# Patient Record
Sex: Female | Born: 1940 | Race: White | Hispanic: No | Marital: Married | Smoking: Never smoker
Health system: Southern US, Community
[De-identification: ages and names within clinical notes are randomized; demographics above are authoritative.]

## PROBLEM LIST (undated history)

## (undated) DIAGNOSIS — R32 Unspecified urinary incontinence: Secondary | ICD-10-CM

## (undated) DIAGNOSIS — R413 Other amnesia: Secondary | ICD-10-CM

## (undated) DIAGNOSIS — D469 Myelodysplastic syndrome, unspecified: Secondary | ICD-10-CM

## (undated) DIAGNOSIS — D649 Anemia, unspecified: Secondary | ICD-10-CM

## (undated) DIAGNOSIS — R0600 Dyspnea, unspecified: Secondary | ICD-10-CM

## (undated) DIAGNOSIS — B019 Varicella without complication: Secondary | ICD-10-CM

## (undated) DIAGNOSIS — M1990 Unspecified osteoarthritis, unspecified site: Secondary | ICD-10-CM

## (undated) DIAGNOSIS — T7840XA Allergy, unspecified, initial encounter: Secondary | ICD-10-CM

## (undated) DIAGNOSIS — I10 Essential (primary) hypertension: Secondary | ICD-10-CM

## (undated) DIAGNOSIS — M069 Rheumatoid arthritis, unspecified: Secondary | ICD-10-CM

## (undated) DIAGNOSIS — E7800 Pure hypercholesterolemia, unspecified: Secondary | ICD-10-CM

## (undated) DIAGNOSIS — E785 Hyperlipidemia, unspecified: Secondary | ICD-10-CM

## (undated) DIAGNOSIS — R011 Cardiac murmur, unspecified: Secondary | ICD-10-CM

## (undated) DIAGNOSIS — K219 Gastro-esophageal reflux disease without esophagitis: Secondary | ICD-10-CM

## (undated) DIAGNOSIS — K259 Gastric ulcer, unspecified as acute or chronic, without hemorrhage or perforation: Secondary | ICD-10-CM

## (undated) DIAGNOSIS — G7100 Muscular dystrophy, unspecified: Secondary | ICD-10-CM

## (undated) DIAGNOSIS — IMO0002 Reserved for concepts with insufficient information to code with codable children: Secondary | ICD-10-CM

## (undated) HISTORY — DX: Rheumatoid arthritis, unspecified: M06.9

## (undated) HISTORY — DX: Myelodysplastic syndrome, unspecified: D46.9

## (undated) HISTORY — DX: Unspecified osteoarthritis, unspecified site: M19.90

## (undated) HISTORY — PX: CHOLECYSTECTOMY: SHX55

## (undated) HISTORY — PX: APPENDECTOMY: SHX54

## (undated) HISTORY — PX: NM INTRAVENOUS INJECTION (ARMC HX): HXRAD1844

## (undated) HISTORY — DX: Anemia, unspecified: D64.9

## (undated) HISTORY — DX: Varicella without complication: B01.9

## (undated) HISTORY — DX: Hyperlipidemia, unspecified: E78.5

## (undated) HISTORY — DX: Gastric ulcer, unspecified as acute or chronic, without hemorrhage or perforation: K25.9

## (undated) HISTORY — DX: Allergy, unspecified, initial encounter: T78.40XA

## (undated) HISTORY — PX: JOINT REPLACEMENT: SHX530

## (undated) HISTORY — PX: TONSILLECTOMY: SUR1361

## (undated) HISTORY — DX: Essential (primary) hypertension: I10

## (undated) HISTORY — PX: COLONOSCOPY: SHX174

## (undated) HISTORY — DX: Gastro-esophageal reflux disease without esophagitis: K21.9

## (undated) HISTORY — DX: Muscular dystrophy, unspecified: G71.00

## (undated) HISTORY — PX: WISDOM TOOTH EXTRACTION: SHX21

## (undated) HISTORY — PX: UPPER GASTROINTESTINAL ENDOSCOPY: SHX188

## (undated) HISTORY — DX: Reserved for concepts with insufficient information to code with codable children: IMO0002

## (undated) HISTORY — PX: ABDOMINAL HYSTERECTOMY: SHX81

---

## 1998-03-06 DIAGNOSIS — D469 Myelodysplastic syndrome, unspecified: Secondary | ICD-10-CM

## 1998-03-06 HISTORY — DX: Myelodysplastic syndrome, unspecified: D46.9

## 2016-05-04 DIAGNOSIS — I10 Essential (primary) hypertension: Secondary | ICD-10-CM | POA: Insufficient documentation

## 2016-05-04 DIAGNOSIS — N3946 Mixed incontinence: Secondary | ICD-10-CM | POA: Insufficient documentation

## 2016-05-04 DIAGNOSIS — M0579 Rheumatoid arthritis with rheumatoid factor of multiple sites without organ or systems involvement: Secondary | ICD-10-CM | POA: Insufficient documentation

## 2016-05-04 DIAGNOSIS — E538 Deficiency of other specified B group vitamins: Secondary | ICD-10-CM | POA: Insufficient documentation

## 2016-05-12 DIAGNOSIS — D469 Myelodysplastic syndrome, unspecified: Secondary | ICD-10-CM | POA: Insufficient documentation

## 2016-05-18 ENCOUNTER — Encounter: Payer: Self-pay | Admitting: Oncology

## 2016-05-18 ENCOUNTER — Inpatient Hospital Stay: Payer: Medicare Other | Attending: Oncology | Admitting: Oncology

## 2016-05-18 VITALS — BP 171/81 | HR 83 | Temp 97.5°F | Resp 18 | Ht <= 58 in | Wt 134.9 lb

## 2016-05-18 DIAGNOSIS — Z90710 Acquired absence of both cervix and uterus: Secondary | ICD-10-CM

## 2016-05-18 DIAGNOSIS — Z801 Family history of malignant neoplasm of trachea, bronchus and lung: Secondary | ICD-10-CM | POA: Diagnosis not present

## 2016-05-18 DIAGNOSIS — D72819 Decreased white blood cell count, unspecified: Secondary | ICD-10-CM | POA: Insufficient documentation

## 2016-05-18 DIAGNOSIS — Z7982 Long term (current) use of aspirin: Secondary | ICD-10-CM | POA: Diagnosis not present

## 2016-05-18 DIAGNOSIS — I10 Essential (primary) hypertension: Secondary | ICD-10-CM | POA: Diagnosis not present

## 2016-05-18 DIAGNOSIS — D469 Myelodysplastic syndrome, unspecified: Secondary | ICD-10-CM | POA: Diagnosis present

## 2016-05-18 DIAGNOSIS — K219 Gastro-esophageal reflux disease without esophagitis: Secondary | ICD-10-CM | POA: Insufficient documentation

## 2016-05-18 DIAGNOSIS — Z79899 Other long term (current) drug therapy: Secondary | ICD-10-CM | POA: Diagnosis not present

## 2016-05-18 DIAGNOSIS — E538 Deficiency of other specified B group vitamins: Secondary | ICD-10-CM | POA: Diagnosis not present

## 2016-05-18 DIAGNOSIS — Z9049 Acquired absence of other specified parts of digestive tract: Secondary | ICD-10-CM | POA: Insufficient documentation

## 2016-05-18 DIAGNOSIS — N3946 Mixed incontinence: Secondary | ICD-10-CM | POA: Diagnosis not present

## 2016-05-18 DIAGNOSIS — E785 Hyperlipidemia, unspecified: Secondary | ICD-10-CM

## 2016-05-18 DIAGNOSIS — M0579 Rheumatoid arthritis with rheumatoid factor of multiple sites without organ or systems involvement: Secondary | ICD-10-CM | POA: Diagnosis not present

## 2016-05-18 NOTE — Progress Notes (Signed)
Here for initial evaluation  

## 2016-05-18 NOTE — Progress Notes (Signed)
Hematology/Oncology Consult note [facility] [phone] [phone]  No care team member to display   Name of the patient: Sylvia Miller  [id number]  December 13, 1940    Reason for referral- h/o MDS   Referring physician- Dr. Netty Starring  Date of visit: 05/18/16   History of presenting illness- patient is a 76 year old female who initially had all her care at [place]. Past medical history is significant for B12 and deficiency for which she receives B12 injections monthly. She also has hypertension hyperlipidemia and rheumatoid arthritis along with other medical problems. She has a history of MDS and had a bone marrow biopsy in 2000 and she was being treated with Aranesp in the past with good response. Looks like her last dose of Aranesp was sometime in 2013 and since then her hemoglobin has remained stable around 12. In October 2016 CBC showed white count of 5.6, H&H of 11.8/36 and a platelet count of 202. MCV was 101. Her labs on 05/05/2016 were as follows: CBC showed white count of 3.4, H&H of 12.5/37 with an MCV of 102.5 and a platelet count of 172. B12 was normal at 642. CMP was within normal limits..  ECOG PS- 2  Pain scale- 9- joint pain    Review of systems- Review of Systems  Constitutional: Negative for chills, fever, malaise/fatigue and weight loss.  HENT: Negative for congestion, ear discharge and nosebleeds.   Eyes: Negative for blurred vision.  Respiratory: Negative for cough, hemoptysis, sputum production, shortness of breath and wheezing.   Cardiovascular: Negative for chest pain, palpitations, orthopnea and claudication.  Gastrointestinal: Negative for abdominal pain, blood in stool, constipation, diarrhea, heartburn, melena, nausea and vomiting.  Genitourinary: Negative for dysuria, flank pain, frequency, hematuria and urgency.  Musculoskeletal: Positive for joint pain (pain in b.l kness and hands). Negative for back pain and  myalgias.  Skin: Negative for rash.  Neurological: Negative for dizziness, tingling, focal weakness, seizures, weakness and headaches.  Endo/Heme/Allergies: Does not bruise/bleed easily.  Psychiatric/Behavioral: Negative for depression and suicidal ideas. The patient does not have insomnia.     No Known Allergies  Patient Active Problem List   Diagnosis Date Noted  . MDS (myelodysplastic syndrome) ([place]) 05/12/2016  . B12 deficiency 05/04/2016  . Essential hypertension 05/04/2016  . Mixed stress and urge urinary incontinence 05/04/2016  . Rheumatoid arthritis involving multiple sites with positive rheumatoid factor ([place]) 05/04/2016     Past Medical History:  Diagnosis Date  . Allergy   . Arthritis    rhematoid /knees/hands/shoulders  . GERD (gastroesophageal reflux disease)   . Hypertension   . MDS (myelodysplastic syndrome) ([place]) 2000  . Ulcer [facility])      Past Surgical History:  Procedure Laterality Date  . ABDOMINAL HYSTERECTOMY    . CHOLECYSTECTOMY      Social History   Social History  . Marital status: Married    Spouse name: N/A  . Number of children: N/A  . Years of education: N/A   Occupational History  . Not on file.   Social History Main Topics  . Smoking status: Never Smoker  . Smokeless tobacco: Never Used  . Alcohol use No  . Drug use: No  . Sexual activity: Yes   Other Topics Concern  . Not on file   Social History Narrative  . No narrative on file     Family History  Problem Relation Age of Onset  . Cancer Mother   . Lung cancer Mother   . Lung  cancer Father      Current Outpatient Prescriptions:  .  acetaminophen (TYLENOL) 500 MG tablet, Take by mouth., Disp: , Rfl:  .  aspirin EC 81 MG tablet, Take by mouth., Disp: , Rfl:  .  B Complex Vitamins (VITAMIN-B COMPLEX) TABS, Take by mouth., Disp: , Rfl:  .  cyanocobalamin (,VITAMIN B-12,) 1000 MCG/ML injection, Inject into the muscle., Disp: , Rfl:  .  GLUCOSAMINE HCL PO, Take by  mouth., Disp: , Rfl:  .  lisinopril (PRINIVIL,ZESTRIL) 5 MG tablet, Take by mouth., Disp: , Rfl:  .  oxybutynin (DITROPAN-XL) 10 MG 24 hr tablet, Take by mouth., Disp: , Rfl:  .  potassium chloride SA (K-DUR,KLOR-CON) 20 MEQ tablet, Take by mouth., Disp: , Rfl:  .  ranitidine (ZANTAC) 150 MG tablet, Take by mouth., Disp: , Rfl:  .  sulfaSALAzine (AZULFIDINE) 500 MG tablet, Take by mouth., Disp: , Rfl:  .  vitamin B-12 (CYANOCOBALAMIN) 1000 MCG tablet, Take by mouth., Disp: , Rfl:  .  guaiFENesin (MUCINEX) 600 MG 12 hr tablet, Take by mouth., Disp: , Rfl:    Physical exam:  Vitals:   05/18/16 0939  BP: (!) 171/81  Pulse: 83  Resp: 18  Temp: 97.5 F (36.4 C)  TempSrc: Tympanic  Weight: 134 lb 14.7 oz (61.2 kg)  Height: 4' 9.48" (1.46 m)   Physical Exam  Constitutional: She is oriented to person, place, and time.  Elderly, walks slowly with a walker  HENT:  Head: Normocephalic and atraumatic.  Eyes: EOM are normal. Pupils are equal, round, and reactive to light.  Neck: Normal range of motion.  Cardiovascular: Normal rate, regular rhythm and normal heart sounds.   Pulmonary/Chest: Effort normal and breath sounds normal.  Abdominal: Soft. Bowel sounds are normal.  Musculoskeletal:  Changes of rheumatoid arthropathy seen in joints of b/l hands  Neurological: She is alert and oriented to person, place, and time.  Skin: Skin is warm and dry.      Assessment and plan- Patient is a 76 y.o. female with a history of MDS in the past who has been referred to as a transfer of care from [place]  We will get in touch with her primary hematologist to see if we can obtain the results of prior bone marrow biopsy. Based on her most recent labs patient was found to have a mild leukopenia but otherwise no evidence of anemia or thrombocytopenia. Patient has not required Aranesp shots in a while.  Given that her counts have been overall stable I will see her back in 3 months time with a repeat CBC  and manual diff. MDS as such is not a curable condition and treatment is considered only when there is significant pancytopenia. At such time we may have to consider repeat bone marrow biopsy.   Thank you for this kind referral and the opportunity to participate in the care of this patient   Visit Diagnosis 1. MDS (myelodysplastic syndrome) ([place])     Dr. Randa Evens, MD, MPH [facility] at [facility] Pager- [phone] 05/18/2016 11:20 AM

## 2016-06-07 ENCOUNTER — Ambulatory Visit: Payer: Medicare Other | Attending: Family Medicine | Admitting: Physical Therapy

## 2016-06-07 ENCOUNTER — Encounter: Payer: Self-pay | Admitting: Physical Therapy

## 2016-06-07 DIAGNOSIS — R2681 Unsteadiness on feet: Secondary | ICD-10-CM | POA: Insufficient documentation

## 2016-06-07 DIAGNOSIS — R262 Difficulty in walking, not elsewhere classified: Secondary | ICD-10-CM | POA: Diagnosis present

## 2016-06-07 DIAGNOSIS — M6281 Muscle weakness (generalized): Secondary | ICD-10-CM | POA: Diagnosis present

## 2016-06-07 NOTE — Therapy (Signed)
[place] MAIN [facility] SERVICES [address] [place], [place], [address] Phone: [phone]   Fax:  [phone]  Physical Therapy Evaluation  Patient Details  Name: Sylvia Miller MRN: [id number] Date of Birth: September 09, 1940 Referring Provider: Dr. Netty Starring  Encounter Date: 06/07/2016      PT End of Session - 06/07/16 0953    Visit Number 1   Number of Visits 17   Date for PT Re-Evaluation 08/02/16   Authorization Type gcodes 1/10   PT Start Time 0902   PT Stop Time 0955   PT Time Calculation (min) 53 min   Activity Tolerance Patient limited by fatigue   Behavior During Therapy [facility] for tasks assessed/performed      Past Medical History:  Diagnosis Date  . Allergy   . Arthritis    rhematoid /knees/hands/shoulders (Right shoulder)   . GERD (gastroesophageal reflux disease)   . Hypertension    controlled with meds;   Marland Kitchen MDS (myelodysplastic syndrome) ([place]) 2000  . Ulcer [facility])     Past Surgical History:  Procedure Laterality Date  . ABDOMINAL HYSTERECTOMY    . CHOLECYSTECTOMY      There were no vitals filed for this visit.       Subjective Assessment - 06/07/16 0906    Subjective 76 yo Female moved to [place] from [place] to be closer to family; She reports having trouble with her walking off/on for years. She did receive outpatient PT for balance and strengthening last year with good results, but has noticed that recently she has had a harder time walking and feeling unsteady; she is currently walking with a rollator off/on for about 4-5 years; She will walk short distances without the walker; she will use a cane sometimes; She denies any falls in last few years but does try to be careful to avoid falls; She does report tingling in the knees but denies any numbness; She recently was referred to Rheumatologist for hip/knee pain and shoulder pain; They are waiting for a plan for pain management; She had started rumacaid  injection but has not had it recently;    Pertinent History personal factors affecting rehab: RA, MDS, high fall risk, 2 story home but able to live on main floor, has supportive caregiver;    Limitations Standing;Walking   How long can you stand comfortably? <5 min;    How long can you walk comfortably? with walker can walk >500 feet, slow    Diagnostic tests Had X-rays hip/knee for rheumatologists, unsure of results;    Patient Stated Goals "feel confident walking without falling" Strike to walk without walker if possible;    Currently in Pain? Yes   Pain Score 6    Pain Location Knee   Pain Orientation Right;Left   Pain Descriptors / Indicators Aching;Throbbing   Pain Type Chronic pain   Pain Radiating Towards lower legs;    Pain Onset More than a month ago   Pain Frequency Intermittent   Aggravating Factors  walking long distances, standing, transfers,    Pain Relieving Factors lying down, rest, pain relieving cream;    Effect of Pain on Daily Activities decreased            [facility] PT Assessment - 06/07/16 0001      Assessment   Medical Diagnosis Risk for falls   Referring Provider Dr. Netty Starring   Onset Date/Surgical Date --  within last year   Hand Dominance Right   Next MD Visit next  week for blood work;    Prior Therapy had PT about a year ago with good results; however reports that she did not continue with HEP and has gotten weaker;      Precautions   Precautions Fall     Restrictions   Weight Bearing Restrictions No     Balance Screen   Has the patient fallen in the past 6 months No   Has the patient had a decrease in activity level because of a fear of falling?  Yes   Is the patient reluctant to leave their home because of a fear of falling?  No     Home Environment   Additional Comments lives in 2 story home, 17 steps to 2nd floor but patient does not go up stairs; bed/bath on main floor; lives with husband, mod I for self care ADLs;   does have  supervision for showers;      Prior Function   Level of Independence Independent with household mobility with device   Vocation Retired   Leisure spend time with grandkids; reads,      Cognition   Overall Cognitive Status Within Functional Limits for tasks assessed     Observation/Other Assessments   Activities of Balance Confidence Scale (ABC Scale)  43.75 % (low level physical functioning, high fall risk)     Sensation   Light Touch Appears Intact   Stereognosis Appears Intact   Proprioception Appears Intact     Coordination   Gross Motor Movements are Fluid and Coordinated Yes   Fine Motor Movements are Fluid and Coordinated Yes     AROM   Overall AROM Comments BUE and BLE are WFL, decreased shoulder, able to reach behind head/ back;     Strength   Overall Strength Comments BUE grossly 3/5, decreased grip strength but equal bilaterally; BLE: hip grossly 3/5, knee 3+/5 ankle 3+/5     Palpation   Palpation comment reports mild-moderate tenderness to joint palpation;      Transfers   Comments requires HHA pushing from chair to get up, very slow;      Ambulation/Gait   Gait Comments ambulates with rollator, wide base of support, with severe knee valgus, ankle inversion; slow gait speed, requires UE pushing on rollator for safety;      6 minute walk test results    Endurance additional comments fatigues very quickly;      Standardized Balance Assessment   10 Meter Walk 0.32 m/s with rollator (high fall risk. limited home ambulator)     High Level Balance   High Level Balance Comments able to stand, feet apart, eyes open/closed unsupported with good static balance; dynamic balance is poor;          TREATMENT: Initiated HEP: Seated with red tband around both legs: Hip flexion march x10 bilaterally; Hip abduction x10 LAQ x10 bilaterally; Ankle DF x10 bilaterally; Patient required min-moderate verbal/tactile cues for correct exercise technique including cues to  increase ROM for better strengthening;  Provided written handout for better strengthening;                    PT Education - 06/07/16 0951    Education provided Yes   Education Details HEP initiated, recommendations;    Person(s) Educated Patient   Methods Explanation;Verbal cues;Handout;Demonstration   Comprehension Verbalized understanding;Returned demonstration;Verbal cues required             PT Long Term Goals - 06/07/16 3559  PT LONG TERM GOAL #1   Title Patient will be independent in home exercise program to improve strength/mobility for better functional independence with ADLs.   Time 8   Period Weeks   Status New     PT LONG TERM GOAL #2   Title Patient will increase 10 meter walk test to > 0.8 m/s as to improve gait speed for better community ambulation and to reduce fall risk.   Time 8   Period Weeks   Status New     PT LONG TERM GOAL #3   Title Patient will increase ABC scale score >70% to demonstrate better functional mobility and better confidence with ADLs.    Time 8   Period Weeks   Status New     PT LONG TERM GOAL #4   Title Patient will increase BLE gross strength to 4+/5 as to improve functional strength for independent gait, increased standing tolerance and increased ADL ability.   Time 8   Period Weeks   Status New     PT LONG TERM GOAL #5   Title Patient will be modified independent in walking on even/uneven surface with least restrictive assistive device, for 20+ minutes without rest break, reporting 5/10 pain in LE's or less to improve walking tolerance with community ambulation including grocery shopping, going to church,etc.    Time 8   Period Weeks   Status New               Plan - 06/24/16 0962    Clinical Impression Statement 76 yo Female has a medical history of severe Rheumatoid Arthritis with pain in shoulders, hips, knees and hands. She reports increased difficulty walking and decreased balance over last  year. She recently moved to [facility] from [place] and is trying to get established with new physicians and healthcare. She tested as a high fall risk, but denies any recent falls. She demonstrates significant weakness in BUE and BLE. Patient reports decreased activity level sitting and sleeping most of the day. She currently ambulates with a rollator at all times.  She would benefit from additional skilled PT intervention to improve strength, balance and gait safety;    Rehab Potential Fair   Clinical Impairments Affecting Rehab Potential positive: good caregiver support, no recent history of falls; negative: co-morbidities; Patient's clinical presentation is evolving as she has severe pain that fluctuates in multiple joints, high fall risk;    PT Frequency 2x / week   PT Duration 8 weeks   PT Treatment/Interventions Aquatic Therapy;Cryotherapy;Moist Heat;Gait training;Stair training;Functional mobility training;Therapeutic activities;Therapeutic exercise;Patient/family education;Orthotic Fit/Training;Neuromuscular re-education;Balance training;Manual techniques;Taping;Energy conservation;Dry needling;Passive range of motion   PT Next Visit Plan advance HEP   PT Home Exercise Plan initiated- see patient instructions;    Consulted and Agree with Plan of Care Patient      Patient will benefit from skilled therapeutic intervention in order to improve the following deficits and impairments:  Abnormal gait, Pain, Postural dysfunction, Decreased mobility, Decreased activity tolerance, Decreased endurance, Decreased range of motion, Decreased strength, Impaired UE functional use, Impaired flexibility, Difficulty walking, Decreased safety awareness, Decreased balance  Visit Diagnosis: Muscle weakness (generalized) - Plan: PT plan of care cert/re-cert  Difficulty in walking, not elsewhere classified - Plan: PT plan of care cert/re-cert  Unsteadiness on feet - Plan: PT plan of care cert/re-cert       G-Codes - 06/24/2016 1000    Functional Assessment Tool Used (Outpatient Only) 10 meter walk, clinical judgement, strength;    Functional Limitation  Mobility: Walking and moving around   Mobility: Walking and Moving Around Current Status [phone]) At least 40 percent but less than 60 percent impaired, limited or restricted   Mobility: Walking and Moving Around Goal Status [phone]) At least 20 percent but less than 40 percent impaired, limited or restricted       Problem List Patient Active Problem List   Diagnosis Date Noted  . MDS (myelodysplastic syndrome) ([place]) 05/12/2016  . B12 deficiency 05/04/2016  . Essential hypertension 05/04/2016  . Mixed stress and urge urinary incontinence 05/04/2016  . Rheumatoid arthritis involving multiple sites with positive rheumatoid factor ([place]) 05/04/2016    Lounette Sloan PT, DPT 06/07/2016, 10:01 AM  [place] MAIN [facility] SERVICES [address] [place], [place], [address] Phone: [phone]   Fax:  [phone]  Name: Sylvia Miller MRN: [id number] Date of Birth: 17-Jul-1940

## 2016-06-07 NOTE — Patient Instructions (Signed)
  ABDUCTION: Sitting - Exercise Ball: Resistance Band (Active)   Sit with feet flat. With band tied around both legs, Lift right leg slightly and, against resistance band, draw it out to side. Complete __2_ sets of __10_ repetitions. Perform _2__ sessions per day.  Copyright  VHI. All rights reserved.  FLEXION: Sitting - Resistance Band (Active)   Sit, both feet flat. Have band tied around both legs above knees, lift right knee toward ceiling.Repeat with other knee Complete _2__ sets of _10__ repetitions. Perform _2__ sessions per day.  [email or web address].exer.[email or web address]  Knee Extension: Resisted (Sitting)   With band looped around right ankle and under other foot, straighten leg with ankle loop. Keep other leg bent to increase resistance. Repeat _10___ times per set. Do __2__ sets per session. Do _2___ sessions per day.  http://orth.exer.us/691   Copyright  VHI. All rights reserved.     Sit with right foot flat. Have band tied around both feet, bend ankle, bringing toes toward head. Complete __2_ sets of __10_ repetitions. Perform _2__ sessions per day.  Copyright  VHI. All rights reserved.  Toe / Heel Raise (Sitting)   Sitting, raise heels, then rock back on heels and raise toes. Repeat _10___ times.  Copyright  VHI. All rights reserved.

## 2016-06-13 ENCOUNTER — Encounter: Payer: Self-pay | Admitting: Physical Therapy

## 2016-06-13 ENCOUNTER — Ambulatory Visit: Payer: Medicare Other | Admitting: Physical Therapy

## 2016-06-13 DIAGNOSIS — M6281 Muscle weakness (generalized): Secondary | ICD-10-CM

## 2016-06-13 DIAGNOSIS — R262 Difficulty in walking, not elsewhere classified: Secondary | ICD-10-CM

## 2016-06-13 DIAGNOSIS — R2681 Unsteadiness on feet: Secondary | ICD-10-CM

## 2016-06-13 NOTE — Therapy (Signed)
[place] MAIN [facility] SERVICES [address] [place], [place], [address] Phone: [phone]   Fax:  [phone]  Physical Therapy Treatment  Patient Details  Name: Sylvia Miller MRN: [id number] Date of Birth: 1940/06/08 Referring Provider: Dr. Netty Starring  Encounter Date: 06/13/2016      PT End of Session - 06/13/16 1118    Visit Number 2   Number of Visits 17   Date for PT Re-Evaluation 08/02/16   Authorization Type gcodes 2/10   PT Start Time 1118   PT Stop Time 1159   PT Time Calculation (min) 41 min   Activity Tolerance Patient limited by fatigue   Behavior During Therapy [facility] for tasks assessed/performed      Past Medical History:  Diagnosis Date  . Allergy   . Arthritis    rhematoid /knees/hands/shoulders (Right shoulder)   . GERD (gastroesophageal reflux disease)   . Hypertension    controlled with meds;   Marland Kitchen MDS (myelodysplastic syndrome) ([place]) 2000  . Ulcer [facility])     Past Surgical History:  Procedure Laterality Date  . ABDOMINAL HYSTERECTOMY    . CHOLECYSTECTOMY      There were no vitals filed for this visit.      Subjective Assessment - 06/13/16 1127    Subjective Pt reports no new changes or concerns.  She has not had a chance to complete her HEP as she was busy traveling to visit her daughter in [place].     Pertinent History personal factors affecting rehab: RA, MDS, high fall risk, 2 story home but able to live on main floor, has supportive caregiver;    Limitations Standing;Walking   How long can you stand comfortably? <5 min;    How long can you walk comfortably? with walker can walk >500 feet, slow    Diagnostic tests Had X-rays hip/knee for rheumatologists, unsure of results;    Patient Stated Goals "feel confident walking without falling" Strike to walk without walker if possible;    Currently in Pain? No/denies       TREATMENT   Therapeutic Exercise:  Sit<>stand from chair with use of BUEs for  support to stand and to reach back to sit. Cues for eccentric control. 2x5    Seated Bil hip ER/Abd with GTB around knees 2x15   Alternating toe taps up to 6" step x2 minutes with intermittent min assist to steady   Side stepping over balance stones x8 lengths in // bars    Neuromuscular Re-ed:  Rhomberg stance on airex 2x30 seconds with intermittent min assist to steady   Tandem stance 2x30 seconds with L foot forward and 2x30 seconds with R foot forward with intermittent min assist to steady   Obstacle course stepping over hurtle, walking over airex pad, sit<>stand, stepping up and over 8" step and weaving around cones. x6 minutes                PT Education - 06/13/16 1118    Education provided Yes   Education Details Exercise technique   Person(s) Educated Patient   Methods Explanation;Demonstration   Comprehension Verbalized understanding;Returned demonstration;Need further instruction             PT Long Term Goals - 06/07/16 0957      PT LONG TERM GOAL #1   Title Patient will be independent in home exercise program to improve strength/mobility for better functional independence with ADLs.   Time 8   Period Weeks   Status New  PT LONG TERM GOAL #2   Title Patient will increase 10 meter walk test to > 0.8 m/s as to improve gait speed for better community ambulation and to reduce fall risk.   Time 8   Period Weeks   Status New     PT LONG TERM GOAL #3   Title Patient will increase ABC scale score >70% to demonstrate better functional mobility and better confidence with ADLs.    Time 8   Period Weeks   Status New     PT LONG TERM GOAL #4   Title Patient will increase BLE gross strength to 4+/5 as to improve functional strength for independent gait, increased standing tolerance and increased ADL ability.   Time 8   Period Weeks   Status New     PT LONG TERM GOAL #5   Title Patient will be modified independent in walking on even/uneven surface  with least restrictive assistive device, for 20+ minutes without rest break, reporting 5/10 pain in LE's or less to improve walking tolerance with community ambulation including grocery shopping, going to church,etc.    Time 8   Period Weeks   Status New               Plan - 06/13/16 1135    Clinical Impression Statement Pt presents with BLE weakness and impaired balance so focus of today's session was on strengthening and neuromuscular re-ed.  Pt demonstrated fatigue with BLE strengthening exercises but tolerated all wel.  She demonstrates difficulty with all balance activities on uneven surfaces.  Pt will benefit from continued skilled PT interventions for improved strength and balance.   Rehab Potential Fair   Clinical Impairments Affecting Rehab Potential positive: good caregiver support, no recent history of falls; negative: co-morbidities; Patient's clinical presentation is evolving as she has severe pain that fluctuates in multiple joints, high fall risk;    PT Frequency 2x / week   PT Duration 8 weeks   PT Treatment/Interventions Aquatic Therapy;Cryotherapy;Moist Heat;Gait training;Stair training;Functional mobility training;Therapeutic activities;Therapeutic exercise;Patient/family education;Orthotic Fit/Training;Neuromuscular re-education;Balance training;Manual techniques;Taping;Energy conservation;Dry needling;Passive range of motion   PT Next Visit Plan advance HEP   PT Home Exercise Plan continue HEP as prescribed.   Consulted and Agree with Plan of Care Patient      Patient will benefit from skilled therapeutic intervention in order to improve the following deficits and impairments:  Abnormal gait, Pain, Postural dysfunction, Decreased mobility, Decreased activity tolerance, Decreased endurance, Decreased range of motion, Decreased strength, Impaired UE functional use, Impaired flexibility, Difficulty walking, Decreased safety awareness, Decreased balance  Visit  Diagnosis: Muscle weakness (generalized)  Difficulty in walking, not elsewhere classified  Unsteadiness on feet     Problem List Patient Active Problem List   Diagnosis Date Noted  . MDS (myelodysplastic syndrome) ([place]) 05/12/2016  . B12 deficiency 05/04/2016  . Essential hypertension 05/04/2016  . Mixed stress and urge urinary incontinence 05/04/2016  . Rheumatoid arthritis involving multiple sites with positive rheumatoid factor ([place]) 05/04/2016    Collie Siad PT, DPT 06/13/2016, 12:01 PM  [place] MAIN [facility] SERVICES [address] [place], [place], [address] Phone: [phone]   Fax:  [phone]  Name: Kemyah Buser MRN: [id number] Date of Birth: 08-21-1940

## 2016-06-15 ENCOUNTER — Ambulatory Visit: Payer: Medicare Other | Admitting: Physical Therapy

## 2016-06-15 ENCOUNTER — Encounter: Payer: Self-pay | Admitting: Physical Therapy

## 2016-06-15 ENCOUNTER — Other Ambulatory Visit: Payer: Self-pay | Admitting: Gastroenterology

## 2016-06-15 DIAGNOSIS — R2681 Unsteadiness on feet: Secondary | ICD-10-CM

## 2016-06-15 DIAGNOSIS — R262 Difficulty in walking, not elsewhere classified: Secondary | ICD-10-CM

## 2016-06-15 DIAGNOSIS — M6281 Muscle weakness (generalized): Secondary | ICD-10-CM

## 2016-06-15 DIAGNOSIS — R1032 Left lower quadrant pain: Secondary | ICD-10-CM

## 2016-06-15 DIAGNOSIS — R1031 Right lower quadrant pain: Secondary | ICD-10-CM

## 2016-06-15 NOTE — Therapy (Signed)
[name] MAIN [facility] SERVICES [address] [place], [place], [address] Phone: [phone]   Fax:  [phone]  Physical Therapy Treatment  Patient Details  Name: Sylvia Miller MRN: [id number] Date of Birth: 1941/02/25 Referring Provider: Dr. Netty Starring  Encounter Date: 06/15/2016      PT End of Session - 06/15/16 1129    Visit Number 3   Number of Visits 17   Date for PT Re-Evaluation 08/02/16   Authorization Type gcodes 3/10   PT Start Time 1118   PT Stop Time 1200   PT Time Calculation (min) 42 min   Equipment Utilized During Treatment Gait belt   Activity Tolerance Patient limited by fatigue   Behavior During Therapy [facility] for tasks assessed/performed      Past Medical History:  Diagnosis Date  . Allergy   . Arthritis    rhematoid /knees/hands/shoulders (Right shoulder)   . GERD (gastroesophageal reflux disease)   . Hypertension    controlled with meds;   Marland Kitchen MDS (myelodysplastic syndrome) ([place]) 2000  . Ulcer [facility])     Past Surgical History:  Procedure Laterality Date  . ABDOMINAL HYSTERECTOMY    . CHOLECYSTECTOMY      There were no vitals filed for this visit.      Subjective Assessment - 06/15/16 1128    Subjective Patient reports doing well; She reports increased LE pain with advanced exercise.    Pertinent History personal factors affecting rehab: RA, MDS, high fall risk, 2 story home but able to live on main floor, has supportive caregiver;    Limitations Standing;Walking   How long can you stand comfortably? <5 min;    How long can you walk comfortably? with walker can walk >500 feet, slow    Diagnostic tests Had X-rays hip/knee for rheumatologists, unsure of results;    Patient Stated Goals "feel confident walking without falling" Strike to walk without walker if possible;    Currently in Pain? Yes   Pain Score 7    Pain Location Knee   Pain Orientation Left   Pain Descriptors / Indicators Aching;Sore   Pain  Type Chronic pain   Pain Radiating Towards lower leg;    Pain Onset More than a month ago   Pain Frequency Intermittent   Aggravating Factors  standing, walking   Pain Relieving Factors rest   Effect of Pain on Daily Activities decreased        TREATMENT  Warm up on Nustep BUE/BLE level 2 x4 min (unbilled);  Seated with red tband around both legs: Hip flexion march 2 x12 with tactile cues to increase ROM for better strengthening; Hip abduction/ER x15 with cues to increase ROM for better strengthening; Ankle DF x15 with min VCs for positioning to improve strengthening;   Forward/backward step over 1/2 bolster x5 with rail assist Side step over 1/2 bolster x5 each direction Patient requires rail assist for safety and min Vcs to increase step length for better foot clearance;   Standing: Hip abduction x10 red tband LLE, patient reports increased pain with RLE, discontinued;  Step ups on 4 inch step with B rail assist x5 with cues for forward lean and to improve weight shift for better foot clearance;    Neuromuscular Re-ed: Standing on 1/2 bolster (flat side up) unsupported x15 sec hold x2 sets with cues to work on ankle strategies for better stance control;   Alternate toe taps on 4 inch step with 1 rail assist x10 bilaterally with min  A for balance and cues for weight shift to improve stance control;    Standing one foot on floor, one foot on 4 inch step, BUE ball pass side/side x5 each foot on step, min A for stance control and cues to slow down UE movement for better balance;                       PT Education - 06/15/16 1129    Education provided Yes   Education Details HEP reinforced, strengthening, balance   Person(s) Educated Patient   Methods Explanation;Demonstration;Verbal cues   Comprehension Verbalized understanding;Returned demonstration;Verbal cues required             PT Long Term Goals - 06/07/16 0957      PT LONG TERM GOAL #1    Title Patient will be independent in home exercise program to improve strength/mobility for better functional independence with ADLs.   Time 8   Period Weeks   Status New     PT LONG TERM GOAL #2   Title Patient will increase 10 meter walk test to > 0.8 m/s as to improve gait speed for better community ambulation and to reduce fall risk.   Time 8   Period Weeks   Status New     PT LONG TERM GOAL #3   Title Patient will increase ABC scale score >70% to demonstrate better functional mobility and better confidence with ADLs.    Time 8   Period Weeks   Status New     PT LONG TERM GOAL #4   Title Patient will increase BLE gross strength to 4+/5 as to improve functional strength for independent gait, increased standing tolerance and increased ADL ability.   Time 8   Period Weeks   Status New     PT LONG TERM GOAL #5   Title Patient will be modified independent in walking on even/uneven surface with least restrictive assistive device, for 20+ minutes without rest break, reporting 5/10 pain in LE's or less to improve walking tolerance with community ambulation including grocery shopping, going to church,etc.    Time 8   Period Weeks   Status New               Plan - 06/15/16 1257    Clinical Impression Statement Patient instructed in advanced LE strengthening exercise. She requires min Vcs for correct exercise technique. Patient requires seated rest breaks due to fatigue and discomfort with prolonged standing. She reports increased RLE knee pain with standing exercise and exhibits some superficial fluid along right anterior knee. Patient would benefit from additional skilled PT intervention to improve strength, balance and gait safety; Will continue to try and get patient into aquatic therapy for comfort;    Rehab Potential Fair   Clinical Impairments Affecting Rehab Potential positive: good caregiver support, no recent history of falls; negative: co-morbidities; Patient's clinical  presentation is evolving as she has severe pain that fluctuates in multiple joints, high fall risk;    PT Frequency 2x / week   PT Duration 8 weeks   PT Treatment/Interventions Aquatic Therapy;Cryotherapy;Moist Heat;Gait training;Stair training;Functional mobility training;Therapeutic activities;Therapeutic exercise;Patient/family education;Orthotic Fit/Training;Neuromuscular re-education;Balance training;Manual techniques;Taping;Energy conservation;Dry needling;Passive range of motion   PT Next Visit Plan advance HEP   PT Home Exercise Plan continue HEP as prescribed.   Consulted and Agree with Plan of Care Patient      Patient will benefit from skilled therapeutic intervention in order to improve the following deficits and  impairments:  Abnormal gait, Pain, Postural dysfunction, Decreased mobility, Decreased activity tolerance, Decreased endurance, Decreased range of motion, Decreased strength, Impaired UE functional use, Impaired flexibility, Difficulty walking, Decreased safety awareness, Decreased balance  Visit Diagnosis: Muscle weakness (generalized)  Difficulty in walking, not elsewhere classified  Unsteadiness on feet     Problem List Patient Active Problem List   Diagnosis Date Noted  . MDS (myelodysplastic syndrome) ([place]) 05/12/2016  . B12 deficiency 05/04/2016  . Essential hypertension 05/04/2016  . Mixed stress and urge urinary incontinence 05/04/2016  . Rheumatoid arthritis involving multiple sites with positive rheumatoid factor ([employer]) 05/04/2016    Trotter,Margaret PT, DPT 06/15/2016, 1:00 PM  [place] MAIN [facility] SERVICES [address] [place], [place], [address] Phone: [phone]   Fax:  [phone]  Name: Seva Chancy MRN: [id number] Date of Birth: 11-23-1940

## 2016-06-20 ENCOUNTER — Ambulatory Visit: Payer: Medicare Other | Admitting: Physical Therapy

## 2016-06-21 ENCOUNTER — Encounter: Payer: Self-pay | Admitting: Physical Therapy

## 2016-06-21 ENCOUNTER — Ambulatory Visit: Payer: Medicare Other | Admitting: Physical Therapy

## 2016-06-21 DIAGNOSIS — R2681 Unsteadiness on feet: Secondary | ICD-10-CM

## 2016-06-21 DIAGNOSIS — R262 Difficulty in walking, not elsewhere classified: Secondary | ICD-10-CM

## 2016-06-21 DIAGNOSIS — M6281 Muscle weakness (generalized): Secondary | ICD-10-CM

## 2016-06-21 NOTE — Therapy (Signed)
[place] MAIN [facility] SERVICES [address] [place], [place], [address] Phone: [phone]   Fax:  [phone]  Physical Therapy Treatment  Patient Details  Name: Sylvia Miller MRN: [id number] Date of Birth: [birth year] Referring Provider: Dr. Netty Starring  Encounter Date: 06/21/2016      PT End of Session - 06/21/16 1305    Visit Number 4   Number of Visits 17   Date for PT Re-Evaluation 08/02/16   Authorization Type gcodes 4/10   PT Start Time 1300   PT Stop Time 1345   PT Time Calculation (min) 45 min   Equipment Utilized During Treatment Gait belt   Activity Tolerance Patient limited by fatigue;Patient limited by pain   Behavior During Therapy [facility] for tasks assessed/performed      Past Medical History:  Diagnosis Date  . Allergy   . Arthritis    rhematoid /knees/hands/shoulders (Right shoulder)   . GERD (gastroesophageal reflux disease)   . Hypertension    controlled with meds;   Marland Kitchen MDS (myelodysplastic syndrome) ([place]) 2000  . Ulcer     Past Surgical History:  Procedure Laterality Date  . ABDOMINAL HYSTERECTOMY    . CHOLECYSTECTOMY      There were no vitals filed for this visit.      Subjective Assessment - 06/21/16 1303    Subjective Patient reports still having BLE knee pain especially with standing and LE exercise.    Pertinent History personal factors affecting rehab: RA, MDS, high fall risk, 2 story home but able to live on main floor, has supportive caregiver;    Limitations Standing;Walking   How long can you stand comfortably? <5 min;    How long can you walk comfortably? with walker can walk >500 feet, slow    Diagnostic tests Had X-rays hip/knee for rheumatologists, unsure of results;    Patient Stated Goals "feel confident walking without falling" Strike to walk without walker if possible;    Currently in Pain? Yes   Pain Score 6    Pain Location Knee   Pain Orientation Right;Left   Pain Descriptors /  Indicators Aching;Sore   Pain Type Chronic pain   Pain Onset More than a month ago   Pain Frequency Intermittent   Aggravating Factors  transfers, standing, walking   Pain Relieving Factors rest   Effect of Pain on Daily Activities decreased      TREATMENT Warm up on Nustep BUE/BLE level 2 x4 min (unbilled);  Patient hooklying: SAQ with large bolster,  2x10 bilaterally with cues to slow down LE movement for better strengthening; Bridges with arms by side x15 with cues to avoid painful ROM;  Hip flexion march green tband x10 bilaterally with tactile cues to increase ROM for better strengthening;  Sidelying clamshells green tband 2x10 bilaterally with cues for positioning and to improve ROM for better strengthening;   Seated with red tband hamstring curl x10 bilaterally; Patient very slow; She required mod VCs to increase ROM for better strengthening;  Patient reports knee pain with AROM; In sitting: PT performed gentle knee distraction 10 sec hold, 5 sec release x5 reps; Grade II PA/AP mobs tibia on femur 10 sec bouts x3 each, bilaterally;  Patient reports less stiffness and slight reduction in knee pain. However She reports increased pain upon standing; Patient would benefit from additional exercise for strengthening and joint pain management;  PT Education - 06/21/16 1304    Education provided Yes   Education Details HEP reinforced, strengthening, manual techniques;    Person(s) Educated Patient   Methods Explanation;Demonstration;Verbal cues   Comprehension Verbalized understanding;Returned demonstration;Verbal cues required;Need further instruction             PT Long Term Goals - 06/07/16 0957      PT LONG TERM GOAL #1   Title Patient will be independent in home exercise program to improve strength/mobility for better functional independence with ADLs.   Time 8   Period Weeks   Status New     PT LONG TERM GOAL #2    Title Patient will increase 10 meter walk test to > 0.8 m/s as to improve gait speed for better community ambulation and to reduce fall risk.   Time 8   Period Weeks   Status New     PT LONG TERM GOAL #3   Title Patient will increase ABC scale score >70% to demonstrate better functional mobility and better confidence with ADLs.    Time 8   Period Weeks   Status New     PT LONG TERM GOAL #4   Title Patient will increase BLE gross strength to 4+/5 as to improve functional strength for independent gait, increased standing tolerance and increased ADL ability.   Time 8   Period Weeks   Status New     PT LONG TERM GOAL #5   Title Patient will be modified independent in walking on even/uneven surface with least restrictive assistive device, for 20+ minutes without rest break, reporting 5/10 pain in LE's or less to improve walking tolerance with community ambulation including grocery shopping, going to church,etc.    Time 8   Period Weeks   Status New               Plan - 06/21/16 1348    Clinical Impression Statement Patient continues to have chronic knee pain; Instructed patient in supine and sidelying exercise for less discomfort; Patient requires cues for correct exercise technique. She continues to have weakness with increased fatigue during exercise. PT also performed manual therapy with joint mobs and gentle distraction to reduce discomfort in BLE knees. Patient reports still having weakness but having less stiffness and discomfort with AROM following manual therapy; She would benefit from additional skilled PT intervention to improve strength, balance and gait safety;    Rehab Potential Fair   Clinical Impairments Affecting Rehab Potential positive: good caregiver support, no recent history of falls; negative: co-morbidities; Patient's clinical presentation is evolving as she has severe pain that fluctuates in multiple joints, high fall risk;    PT Frequency 2x / week   PT  Duration 8 weeks   PT Treatment/Interventions Aquatic Therapy;Cryotherapy;Moist Heat;Gait training;Stair training;Functional mobility training;Therapeutic activities;Therapeutic exercise;Patient/family education;Orthotic Fit/Training;Neuromuscular re-education;Balance training;Manual techniques;Taping;Energy conservation;Dry needling;Passive range of motion   PT Next Visit Plan advance HEP   PT Home Exercise Plan continue HEP as prescribed.   Consulted and Agree with Plan of Care Patient      Patient will benefit from skilled therapeutic intervention in order to improve the following deficits and impairments:  Abnormal gait, Pain, Postural dysfunction, Decreased mobility, Decreased activity tolerance, Decreased endurance, Decreased range of motion, Decreased strength, Impaired UE functional use, Impaired flexibility, Difficulty walking, Decreased safety awareness, Decreased balance  Visit Diagnosis: Muscle weakness (generalized)  Difficulty in walking, not elsewhere classified  Unsteadiness on feet     Problem List Patient Active Problem  List   Diagnosis Date Noted  . MDS (myelodysplastic syndrome) ([place]) 05/12/2016  . B12 deficiency 05/04/2016  . Essential hypertension 05/04/2016  . Mixed stress and urge urinary incontinence 05/04/2016  . Rheumatoid arthritis involving multiple sites with positive rheumatoid factor ([place]) 05/04/2016    Braeden Kennan PT, DPT 06/21/2016, 2:01 PM  [place] MAIN [facility] SERVICES [address] [place], [place], [address] Phone: [phone]   Fax:  [phone]  Name: Sylvia Miller MRN: [id number] Date of Birth: 04-02-1940

## 2016-06-22 ENCOUNTER — Encounter: Payer: Self-pay | Admitting: Physical Therapy

## 2016-06-22 ENCOUNTER — Ambulatory Visit: Payer: Medicare Other | Admitting: Physical Therapy

## 2016-06-22 DIAGNOSIS — R2681 Unsteadiness on feet: Secondary | ICD-10-CM

## 2016-06-22 DIAGNOSIS — M6281 Muscle weakness (generalized): Secondary | ICD-10-CM

## 2016-06-22 DIAGNOSIS — R262 Difficulty in walking, not elsewhere classified: Secondary | ICD-10-CM

## 2016-06-22 NOTE — Therapy (Signed)
[place] MAIN [facility] SERVICES [address] [place], [place], [address] Phone: [phone]   Fax:  [phone]  Physical Therapy Treatment  Patient Details  Name: Sylvia Miller MRN: [id number] Date of Birth: 02-20-1941 Referring Provider: Dr. Netty Starring  Encounter Date: 06/22/2016      PT End of Session - 06/22/16 1126    Visit Number 5   Number of Visits 17   Date for PT Re-Evaluation 08/02/16   Authorization Type gcodes 5/10   PT Start Time 1115   PT Stop Time 1157   PT Time Calculation (min) 42 min   Activity Tolerance Patient limited by fatigue;Patient limited by pain   Behavior During Therapy [facility] for tasks assessed/performed      Past Medical History:  Diagnosis Date  . Allergy   . Arthritis    rhematoid /knees/hands/shoulders (Right shoulder)   . GERD (gastroesophageal reflux disease)   . Hypertension    controlled with meds;   Marland Kitchen MDS (myelodysplastic syndrome) ([place]) 2000  . Ulcer     Past Surgical History:  Procedure Laterality Date  . ABDOMINAL HYSTERECTOMY    . CHOLECYSTECTOMY      There were no vitals filed for this visit.      Subjective Assessment - 06/22/16 1125    Subjective Patient reports increased stiffness this morning in both knees; reports still having trouble with transfers and with walking; Presents to therapy with SPC with tripod base; She denies any recent falls;    Pertinent History personal factors affecting rehab: RA, MDS, high fall risk, 2 story home but able to live on main floor, has supportive caregiver;    Limitations Standing;Walking   How long can you stand comfortably? <5 min;    How long can you walk comfortably? with walker can walk >500 feet, slow    Diagnostic tests Had X-rays hip/knee for rheumatologists, unsure of results;    Patient Stated Goals "feel confident walking without falling" Strike to walk without walker if possible;    Currently in Pain? Yes   Pain Score 6    Pain  Location Knee   Pain Orientation Right;Left   Pain Descriptors / Indicators Aching;Tightness;Sore   Pain Type Chronic pain   Pain Onset More than a month ago         TREATMENT Patient in hooklying with moist heat applied to BLE knees x4 min (Unbilled);  Patient hooklying: SAQ with large bolster, x15 bilaterally with cues to slow down LE movement for better strengthening; Bridges with arms by side x15 with cues to avoid painful ROM;  Hip flexion march green tband x15 bilaterally with tactile cues to increase ROM for better strengthening; BLE hip abduction/ER green tband x15 with min Vcs to increase ROM for better strengthening; Patient slow with initial LE movement but improves with repetition;   Sidelying clamshells green tband x15 bilaterally with cues for positioning and to improve ROM for better strengthening;   Prone: Knee flexion AROM to range of motion of tolerance x10 bilaterally; cues to avoid painful ROM;  Glute max hip extension x10 bilaterally with cues for positioning for better strengthening;    Gait around gym x80 feet with tripod cane, close supervision with cues to increase step length for better gait speed; Patient very slow with ambulation, demonstrates wide base of support but has severe knee valgus with knees bumping due to severe OA. Reports increased pain upon walking, despite pain relief in hooklying;  PT Education - 06/22/16 1126    Education provided Yes   Education Details HEP reinforced, strengthening;    Person(s) Educated Patient   Methods Explanation;Demonstration;Verbal cues   Comprehension Verbalized understanding;Returned demonstration;Verbal cues required             PT Long Term Goals - 06/07/16 0957      PT LONG TERM GOAL #1   Title Patient will be independent in home exercise program to improve strength/mobility for better functional independence with ADLs.   Time 8   Period Weeks   Status New      PT LONG TERM GOAL #2   Title Patient will increase 10 meter walk test to > 0.8 m/s as to improve gait speed for better community ambulation and to reduce fall risk.   Time 8   Period Weeks   Status New     PT LONG TERM GOAL #3   Title Patient will increase ABC scale score >70% to demonstrate better functional mobility and better confidence with ADLs.    Time 8   Period Weeks   Status New     PT LONG TERM GOAL #4   Title Patient will increase BLE gross strength to 4+/5 as to improve functional strength for independent gait, increased standing tolerance and increased ADL ability.   Time 8   Period Weeks   Status New     PT LONG TERM GOAL #5   Title Patient will be modified independent in walking on even/uneven surface with least restrictive assistive device, for 20+ minutes without rest break, reporting 5/10 pain in LE's or less to improve walking tolerance with community ambulation including grocery shopping, going to church,etc.    Time 8   Period Weeks   Status New               Plan - 06/22/16 1214    Clinical Impression Statement Patient instructed in BLE strengthening exercise. She responded well to heat with less discomfort. However upon standing, started having increased knee pain. Patient demonstrates significant knee valgus from severe OA. Patient was able to do increased repetition with less fatigue. Patient would benefit from additional skilled PT intervention to improve strength, balance and gait safety;    Rehab Potential Fair   Clinical Impairments Affecting Rehab Potential positive: good caregiver support, no recent history of falls; negative: co-morbidities; Patient's clinical presentation is evolving as she has severe pain that fluctuates in multiple joints, high fall risk;    PT Frequency 2x / week   PT Duration 8 weeks   PT Treatment/Interventions Aquatic Therapy;Cryotherapy;Moist Heat;Gait training;Stair training;Functional mobility training;Therapeutic  activities;Therapeutic exercise;Patient/family education;Orthotic Fit/Training;Neuromuscular re-education;Balance training;Manual techniques;Taping;Energy conservation;Dry needling;Passive range of motion   PT Next Visit Plan advance HEP   PT Home Exercise Plan continue HEP as prescribed.   Consulted and Agree with Plan of Care Patient      Patient will benefit from skilled therapeutic intervention in order to improve the following deficits and impairments:  Abnormal gait, Pain, Postural dysfunction, Decreased mobility, Decreased activity tolerance, Decreased endurance, Decreased range of motion, Decreased strength, Impaired UE functional use, Impaired flexibility, Difficulty walking, Decreased safety awareness, Decreased balance  Visit Diagnosis: Muscle weakness (generalized)  Difficulty in walking, not elsewhere classified  Unsteadiness on feet     Problem List Patient Active Problem List   Diagnosis Date Noted  . MDS (myelodysplastic syndrome) ([place]) 05/12/2016  . B12 deficiency 05/04/2016  . Essential hypertension 05/04/2016  . Mixed stress and urge urinary incontinence  05/04/2016  . Rheumatoid arthritis involving multiple sites with positive rheumatoid factor ([place]) 05/04/2016    Trotter,Margaret PT, DPT 06/22/2016, 12:17 PM  [place] MAIN [facility] SERVICES [address] [place], [place], [address] Phone: [phone]   Fax:  [phone]  Name: Sylvia Miller MRN: [id number] Date of Birth: Nov 26, 1940

## 2016-06-27 ENCOUNTER — Ambulatory Visit: Payer: Self-pay | Admitting: Physical Therapy

## 2016-06-27 ENCOUNTER — Ambulatory Visit
Admission: RE | Admit: 2016-06-27 | Discharge: 2016-06-27 | Disposition: A | Discharge status: Home / Self Care | Payer: Medicare Other | Source: Ambulatory Visit | Attending: Gastroenterology | Admitting: Gastroenterology

## 2016-06-27 DIAGNOSIS — R1032 Left lower quadrant pain: Secondary | ICD-10-CM

## 2016-06-27 DIAGNOSIS — R1013 Epigastric pain: Secondary | ICD-10-CM | POA: Insufficient documentation

## 2016-06-27 DIAGNOSIS — I700 Atherosclerosis of aorta: Secondary | ICD-10-CM | POA: Insufficient documentation

## 2016-06-27 DIAGNOSIS — R1031 Right lower quadrant pain: Secondary | ICD-10-CM | POA: Insufficient documentation

## 2016-06-27 DIAGNOSIS — K5732 Diverticulitis of large intestine without perforation or abscess without bleeding: Secondary | ICD-10-CM | POA: Diagnosis not present

## 2016-06-27 DIAGNOSIS — G8929 Other chronic pain: Secondary | ICD-10-CM | POA: Diagnosis present

## 2016-06-27 MED ORDER — IOPAMIDOL (ISOVUE-300) INJECTION 61%
100.0000 mL | Freq: Once | INTRAVENOUS | Status: AC | PRN
  Administered 2016-06-27: 100 mL via INTRAVENOUS

## 2016-07-04 ENCOUNTER — Ambulatory Visit: Payer: Self-pay | Admitting: Physical Therapy

## 2016-07-06 ENCOUNTER — Ambulatory Visit: Payer: Medicare Other | Attending: Family Medicine | Admitting: Physical Therapy

## 2016-07-06 ENCOUNTER — Ambulatory Visit: Payer: Self-pay | Admitting: Physical Therapy

## 2016-07-06 DIAGNOSIS — R2681 Unsteadiness on feet: Secondary | ICD-10-CM | POA: Diagnosis present

## 2016-07-06 DIAGNOSIS — R262 Difficulty in walking, not elsewhere classified: Secondary | ICD-10-CM | POA: Diagnosis present

## 2016-07-06 DIAGNOSIS — M6281 Muscle weakness (generalized): Secondary | ICD-10-CM

## 2016-07-06 NOTE — Therapy (Signed)
[place] MAIN [facility] SERVICES [address] [place], [place], [address] Phone: [phone]   Fax:  [phone]  Physical Therapy Treatment  Patient Details  Name: Sylvia Miller MRN: [id number] Date of Birth: [birth year] Referring Provider: Dr. Netty Starring  Encounter Date: 07/06/2016      PT End of Session - 07/06/16 0929    Visit Number 6   Number of Visits 17   Date for PT Re-Evaluation 08/02/16   Authorization Type gcodes 6/10   PT Start Time 0820   PT Stop Time 0855   PT Time Calculation (min) 35 min   Activity Tolerance Patient limited by fatigue;Patient limited by pain   Behavior During Therapy [facility] for tasks assessed/performed      Past Medical History:  Diagnosis Date  . Allergy   . Arthritis    rhematoid /knees/hands/shoulders (Right shoulder)   . GERD (gastroesophageal reflux disease)   . Hypertension    controlled with meds;   Marland Kitchen MDS (myelodysplastic syndrome) ([place]) 2000  . Ulcer     Past Surgical History:  Procedure Laterality Date  . ABDOMINAL HYSTERECTOMY    . CHOLECYSTECTOMY      There were no vitals filed for this visit.      Subjective Assessment - 07/06/16 0923    Subjective Pt reports her shoulders hurt from pulling onto things and holds onto husband for assistance with getting out of a chair and the car. Presents to therapy with [facility] w/ tripod base. Denied falls. Pt enjoyed pool therapy when she did it in [place].     Pertinent History personal factors affecting rehab: RA, MDS, high fall risk, 2 story home but able to live on main floor, has supportive caregiver;    Limitations Standing;Walking   How long can you stand comfortably? <5 min;    How long can you walk comfortably? with walker can walk >500 feet, slow    Diagnostic tests Had X-rays hip/knee for rheumatologists, unsure of results;    Patient Stated Goals "feel confident walking without falling" Strike to walk without walker if possible;    Pain  Onset More than a month ago                     Adult Aquatic Therapy - 07/06/16 0926      Aquatic Therapy Subjective   Subjective Pt reported R knee pain after walking 1 lap and less pain with cue for stronger push off. Pt was able to perform 1 more lap forward walking and R sidestepping out of pool            O: Pt entered the pool via ramp in [facility] pushed by PT  50 ft =1 lap  Exercises performed in 3'6" depth   Seated on bench 10'  Leg circles, toe abduction, LAQ with toe ext/DF initiation, hip abduction/adduction  Noodles under arms/ chest: 2 laps walking backward (PT providing SBA but pt did not demo LOB) Cued for knee flexion/ Plantarflexion, eccentric lowering of heel   2 laps side stepping 1= left, 1= right.   Seated on bench: rest with gentle ROM for legs and hands   exited pool with BUE on rail with R sidestepping. Cues for feet under hips for broader BOS. SBA assist   Chair was placed at the end of the rail. Practiced 3 reps of sit to stand with cues for alignment, less pulling on rail in front her, and reach on chair arms to pushoff  then reach for rails in front of her. Pt required less cues by the 3rd rep.  PT provided CGA into locker room to a seated position. Husband entered locker room to assist her with dressing.                    PT Education - 07/06/16 0934    Education provided Yes   Education Details HEP = sit to stand mechanics with pushing off chair then reaching for support in front of her, 5 reps sidestepping L/R on kitchen counter.    Person(s) Educated Spouse;Patient   Methods Explanation;Demonstration;Tactile cues;Verbal cues   Comprehension Verbalized understanding;Returned demonstration;Verbal cues required;Tactile cues required             PT Long Term Goals - 06/07/16 0957      PT LONG TERM GOAL #1   Title Patient will be independent in home exercise program to improve strength/mobility for better functional  independence with ADLs.   Time 8   Period Weeks   Status New     PT LONG TERM GOAL #2   Title Patient will increase 10 meter walk test to > 0.8 m/s as to improve gait speed for better community ambulation and to reduce fall risk.   Time 8   Period Weeks   Status New     PT LONG TERM GOAL #3   Title Patient will increase ABC scale score >70% to demonstrate better functional mobility and better confidence with ADLs.    Time 8   Period Weeks   Status New     PT LONG TERM GOAL #4   Title Patient will increase BLE gross strength to 4+/5 as to improve functional strength for independent gait, increased standing tolerance and increased ADL ability.   Time 8   Period Weeks   Status New     PT LONG TERM GOAL #5   Title Patient will be modified independent in walking on even/uneven surface with least restrictive assistive device, for 20+ minutes without rest break, reporting 5/10 pain in LE's or less to improve walking tolerance with community ambulation including grocery shopping, going to church,etc.    Time 8   Period Weeks   Status New               Plan - 07/06/16 0929    Clinical Impression Statement Pt tolerated pool therapy today. Pt had initial pain in R knee after walking 1 lap but had decreased pain after cue for a stronger pushoff in RLE.  Pt demo'd excessive cues to pull less on rail when sidestepping out of the pool and with sit to stand from a chair placed near the rail. Proper body mechanics and cues with sit to stand were provided with 3 reps of practice. Pt demo'd correctly with husband watching. These new techniques helped as she reported less shoulder pain with less pulling. Husband was educated on guarding patient with an underhand grip and semi tandem stance instead of holding her hand. Pt had no complaints with side stepping L/R. Pt and husband were provided information about the [employer]. They will benefit from a land visit with safe t/f out of  the car. Pt continues to benefit from skilled PT.     Rehab Potential Fair   Clinical Impairments Affecting Rehab Potential positive: good caregiver support, no recent history of falls; negative: co-morbidities; Patient's clinical presentation is evolving as she has severe pain that fluctuates in multiple joints, high fall  risk;    PT Frequency 2x / week   PT Duration 8 weeks   PT Treatment/Interventions Aquatic Therapy;Cryotherapy;Moist Heat;Gait training;Stair training;Functional mobility training;Therapeutic activities;Therapeutic exercise;Patient/family education;Orthotic Fit/Training;Neuromuscular re-education;Balance training;Manual techniques;Taping;Energy conservation;Dry needling;Passive range of motion   PT Next Visit Plan advance HEP   PT Home Exercise Plan continue HEP as prescribed.   Consulted and Agree with Plan of Care Patient      Patient will benefit from skilled therapeutic intervention in order to improve the following deficits and impairments:  Abnormal gait, Pain, Postural dysfunction, Decreased mobility, Decreased activity tolerance, Decreased endurance, Decreased range of motion, Decreased strength, Impaired UE functional use, Impaired flexibility, Difficulty walking, Decreased safety awareness, Decreased balance  Visit Diagnosis: Muscle weakness (generalized)  Difficulty in walking, not elsewhere classified  Unsteadiness on feet     Problem List Patient Active Problem List   Diagnosis Date Noted  . MDS (myelodysplastic syndrome) ([place]) 05/12/2016  . B12 deficiency 05/04/2016  . Essential hypertension 05/04/2016  . Mixed stress and urge urinary incontinence 05/04/2016  . Rheumatoid arthritis involving multiple sites with positive rheumatoid factor ([place]) 05/04/2016    Jerl Mina ,PT, DPT, E-RYT  07/06/2016, 9:38 AM  [place] MAIN [facility] SERVICES [address] [place], [place], [address] Phone: [phone]   Fax:   [phone]  Name: Sylvia Miller MRN: [id number] Date of Birth: Nov 04, 1940

## 2016-07-11 ENCOUNTER — Ambulatory Visit: Payer: Self-pay | Admitting: Physical Therapy

## 2016-07-11 ENCOUNTER — Ambulatory Visit: Payer: Medicare Other | Admitting: Physical Therapy

## 2016-07-11 DIAGNOSIS — M6281 Muscle weakness (generalized): Secondary | ICD-10-CM | POA: Diagnosis not present

## 2016-07-11 DIAGNOSIS — R262 Difficulty in walking, not elsewhere classified: Secondary | ICD-10-CM

## 2016-07-11 DIAGNOSIS — R2681 Unsteadiness on feet: Secondary | ICD-10-CM

## 2016-07-12 NOTE — Therapy (Signed)
[place] MAIN [facility] SERVICES [address] [place], [place], [address] Phone: [phone]   Fax:  [phone]  Physical Therapy Treatment  Patient Details  Name: Sylvia Miller MRN: [id number] Date of Birth: 11-20-1940 Referring Provider: Dr. Netty Starring  Encounter Date: 07/11/2016      PT End of Session - 07/12/16 1914    Visit Number 7   Number of Visits 17   Date for PT Re-Evaluation 08/02/16   Authorization Type gcodes 7/10   PT Start Time 1120   PT Stop Time 1155   PT Time Calculation (min) 35 min   Activity Tolerance Patient limited by fatigue;Patient limited by pain   Behavior During Therapy [facility] for tasks assessed/performed      Past Medical History:  Diagnosis Date  . Allergy   . Arthritis    rhematoid /knees/hands/shoulders (Right shoulder)   . GERD (gastroesophageal reflux disease)   . Hypertension    controlled with meds;   Marland Kitchen MDS (myelodysplastic syndrome) ([place]) 2000  . Ulcer     Past Surgical History:  Procedure Laterality Date  . ABDOMINAL HYSTERECTOMY    . CHOLECYSTECTOMY      There were no vitals filed for this visit.      Subjective Assessment - 07/12/16 1913    Subjective Pt had no complaints from last pool session. Pt reported she did not practice the sit to stand exercise to minimize pulling onto support in front of her   Pertinent History personal factors affecting rehab: RA, MDS, high fall risk, 2 story home but able to live on main floor, has supportive caregiver;    Limitations Standing;Walking   How long can you stand comfortably? <5 min;    How long can you walk comfortably? with walker can walk >500 feet, slow    Diagnostic tests Had X-rays hip/knee for rheumatologists, unsure of results;    Patient Stated Goals "feel confident walking without falling" Strike to walk without walker if possible;    Pain Onset More than a month ago                     Adult Aquatic Therapy - 07/12/16  1914      Aquatic Therapy Subjective   Subjective Pt had no complaints          O: Pt entered the pool via ramp with BUE support on rail , sidestepping.  PT donned gait belt and provided SBA with transitions on land.  50 ft =1 lap  Exercises performed in 3'6" depth   Seated on bench 10'  Leg circles, toe abduction, LAQ with toe ext/DF initiation, hip abduction/adduction  Noodles under arms/ chest: 2 laps walking backward walking with cues for shoulder retraction. depression   4 laps side stepping L/R  4 laps stepping backwards with 45 pivot turns (simulated waltzing)   PT supporting pt : floating on noodles circumferentially wrapped around chest 2' kicking  Followed by relaxation practice , pulled by PT 3'     PT provided SBA into locker room to a seated position. Husband entered locker room to assist her with dressing.                       PT Long Term Goals - 06/07/16 0957      PT LONG TERM GOAL #1   Title Patient will be independent in home exercise program to improve strength/mobility for better functional independence with ADLs.  Time 8   Period Weeks   Status New     PT LONG TERM GOAL #2   Title Patient will increase 10 meter walk test to > 0.8 m/s as to improve gait speed for better community ambulation and to reduce fall risk.   Time 8   Period Weeks   Status New     PT LONG TERM GOAL #3   Title Patient will increase ABC scale score >70% to demonstrate better functional mobility and better confidence with ADLs.    Time 8   Period Weeks   Status New     PT LONG TERM GOAL #4   Title Patient will increase BLE gross strength to 4+/5 as to improve functional strength for independent gait, increased standing tolerance and increased ADL ability.   Time 8   Period Weeks   Status New     PT LONG TERM GOAL #5   Title Patient will be modified independent in walking on even/uneven surface with least restrictive assistive device, for  20+ minutes without rest break, reporting 5/10 pain in LE's or less to improve walking tolerance with community ambulation including grocery shopping, going to church,etc.    Time 8   Period Weeks   Status New               Plan - 07/12/16 1915    Clinical Impression Statement Pt had no complaints today with pool therapy. Pt was able to enter/exit pool using hand rail with BUE via ramp without difficulty. Pt also progressed to dynamic balance training with stepping backward and pivot turns. Pt required cues to decrease upper trap activation for stability on noodles. Pt showed improved posture following cues. Pt continues to benefit from skilled PT.    Rehab Potential Fair   Clinical Impairments Affecting Rehab Potential positive: good caregiver support, no recent history of falls; negative: co-morbidities; Patient's clinical presentation is evolving as she has severe pain that fluctuates in multiple joints, high fall risk;    PT Frequency 2x / week   PT Duration 8 weeks   PT Treatment/Interventions Aquatic Therapy;Cryotherapy;Moist Heat;Gait training;Stair training;Functional mobility training;Therapeutic activities;Therapeutic exercise;Patient/family education;Orthotic Fit/Training;Neuromuscular re-education;Balance training;Manual techniques;Taping;Energy conservation;Dry needling;Passive range of motion   PT Next Visit Plan advance HEP   PT Home Exercise Plan continue HEP as prescribed.   Consulted and Agree with Plan of Care Patient      Patient will benefit from skilled therapeutic intervention in order to improve the following deficits and impairments:  Abnormal gait, Pain, Postural dysfunction, Decreased mobility, Decreased activity tolerance, Decreased endurance, Decreased range of motion, Decreased strength, Impaired UE functional use, Impaired flexibility, Difficulty walking, Decreased safety awareness, Decreased balance  Visit Diagnosis: Muscle weakness  (generalized)  Difficulty in walking, not elsewhere classified  Unsteadiness on feet     Problem List Patient Active Problem List   Diagnosis Date Noted  . MDS (myelodysplastic syndrome) ([place]) 05/12/2016  . B12 deficiency 05/04/2016  . Essential hypertension 05/04/2016  . Mixed stress and urge urinary incontinence 05/04/2016  . Rheumatoid arthritis involving multiple sites with positive rheumatoid factor ([place]) 05/04/2016    Jerl Mina ,PT, DPT, E-RYT  07/12/2016, 7:17 PM  [place] MAIN [facility] SERVICES [address] [place], [place], [address] Phone: [phone]   Fax:  [phone]  Name: Akeylah Hendel MRN: [id number] Date of Birth: Apr 04, 1940

## 2016-07-13 ENCOUNTER — Ambulatory Visit: Payer: Self-pay | Admitting: Physical Therapy

## 2016-07-18 ENCOUNTER — Ambulatory Visit: Payer: Medicare Other | Admitting: Physical Therapy

## 2016-07-18 ENCOUNTER — Ambulatory Visit: Payer: Self-pay | Admitting: Physical Therapy

## 2016-07-20 ENCOUNTER — Ambulatory Visit: Payer: Self-pay | Admitting: Physical Therapy

## 2016-07-25 ENCOUNTER — Ambulatory Visit: Payer: Self-pay | Admitting: Physical Therapy

## 2016-07-27 ENCOUNTER — Ambulatory Visit: Payer: Medicare Other | Admitting: Physical Therapy

## 2016-07-27 ENCOUNTER — Ambulatory Visit: Payer: Self-pay | Admitting: Physical Therapy

## 2016-07-27 ENCOUNTER — Encounter: Payer: Self-pay | Admitting: Physical Therapy

## 2016-07-27 DIAGNOSIS — M6281 Muscle weakness (generalized): Secondary | ICD-10-CM

## 2016-07-27 DIAGNOSIS — R2681 Unsteadiness on feet: Secondary | ICD-10-CM

## 2016-07-27 DIAGNOSIS — R262 Difficulty in walking, not elsewhere classified: Secondary | ICD-10-CM

## 2016-07-27 NOTE — Therapy (Signed)
[place] MAIN [facility] SERVICES [address] [place], [place], [address] Phone: [phone]   Fax:  [phone]  Physical Therapy Treatment  Patient Details  Name: Sylvia Miller MRN: [id number] Date of Birth: 11-10-1940 Referring Provider: Dr. Netty Starring  Encounter Date: 07/27/2016      PT End of Session - 07/27/16 1027    Visit Number 8   Number of Visits 17   Date for PT Re-Evaluation 08/02/16   Authorization Type gcodes 8/10   PT Start Time 0827   PT Stop Time 0900   PT Time Calculation (min) 33 min   Equipment Utilized During Treatment Gait belt   Activity Tolerance Patient limited by fatigue;Patient limited by pain   Behavior During Therapy [facility] for tasks assessed/performed      Past Medical History:  Diagnosis Date  . Allergy   . Arthritis    rhematoid /knees/hands/shoulders (Right shoulder)   . GERD (gastroesophageal reflux disease)   . Hypertension    controlled with meds;   Marland Kitchen MDS (myelodysplastic syndrome) ([place]) 2000  . Ulcer     Past Surgical History:  Procedure Laterality Date  . ABDOMINAL HYSTERECTOMY    . CHOLECYSTECTOMY      There were no vitals filed for this visit.      Subjective Assessment - 07/27/16 1024    Subjective Pt had no complaints about the last session.    Pertinent History personal factors affecting rehab: RA, MDS, high fall risk, 2 story home but able to live on main floor, has supportive caregiver;    Limitations Standing;Walking   How long can you stand comfortably? <5 min;    How long can you walk comfortably? with walker can walk >500 feet, slow    Diagnostic tests Had X-rays hip/knee for rheumatologists, unsure of results;    Patient Stated Goals "feel confident walking without falling" Strike to walk without walker if possible;    Pain Onset More than a month ago              Adult Aquatic Therapy - 07/27/16 1026      Aquatic Therapy Subjective   Subjective Pt had no  complaints         O: Pt entered the pool via ramp with BUE support on rail , sidestepping.  PT donned gait belt and provided SBA with transitions on land.  50 ft =1 lap  Exercises performed in 3'6" depth   4 laps forward walking with cues for high thighs and ER of hip   4 laps side stepping L/R   PT supporting pt : floating on noodles circumferentially wrapped around chest  4 laps shoulder and leg abduction/ adduction , cued for more shoulder ROM (limited to ~70 deg B)     Seated on bench 10' Leg circles, toe abduction, LAQ with toe ext/DF initiation, hip abduction/adduction  Rest against noodles on bench              PT Long Term Goals - 07/27/16 1032      PT LONG TERM GOAL #1   Title Patient will be independent in home exercise program to improve strength/mobility for better functional independence with ADLs.   Time 8   Period Weeks   Status On-going     PT LONG TERM GOAL #2   Title Patient will increase 10 meter walk test to > 0.8 m/s as to improve gait speed for better community ambulation and to reduce fall risk.  Time 8   Period Weeks   Status On-going     PT LONG TERM GOAL #3   Title Patient will increase ABC scale score >70% to demonstrate better functional mobility and better confidence with ADLs.    Time 8   Period Weeks   Status On-going     PT LONG TERM GOAL #4   Title Patient will increase BLE gross strength to 4+/5 as to improve functional strength for independent gait, increased standing tolerance and increased ADL ability.   Time 8   Period Weeks   Status On-going     PT LONG TERM GOAL #5   Title Patient will be modified independent in walking on even/uneven surface with least restrictive assistive device, for 20+ minutes without rest break, reporting 5/10 pain in LE's or less to improve walking tolerance with community ambulation including grocery shopping, going to church,etc.    Time 8   Period Weeks   Status  On-going               Plan - 07/27/16 1029    Clinical Impression Statement Pt is progressing well towards her goals and is responding better with pool therapy compared to land treatments. Today pt showed less reliance on BUE to ascend ramp out of the pool compared to previous sessions. Pt was able to use single UE on rail and showed more stability and strength.Her assistance decreased from SBA to supervision. Her sit to stand also showed less difficulty. Advanced to postural stability exercises in supine floating on noodles to strengthen BUE/LE. Pt required support of her head to minimize discomfort of her neck. Pt also showed less LOB with gait training compared to previous sessions. Pt continues to benefit from skilled PT.   Rehab Potential Fair   Clinical Impairments Affecting Rehab Potential positive: good caregiver support, no recent history of falls; negative: co-morbidities; Patient's clinical presentation is evolving as she has severe pain that fluctuates in multiple joints, high fall risk;    PT Frequency 2x / week   PT Duration 8 weeks   PT Treatment/Interventions Aquatic Therapy;Cryotherapy;Moist Heat;Gait training;Stair training;Functional mobility training;Therapeutic activities;Therapeutic exercise;Patient/family education;Orthotic Fit/Training;Neuromuscular re-education;Balance training;Manual techniques;Taping;Energy conservation;Dry needling;Passive range of motion   PT Next Visit Plan advance HEP   PT Home Exercise Plan continue HEP as prescribed.   Consulted and Agree with Plan of Care Patient      Patient will benefit from skilled therapeutic intervention in order to improve the following deficits and impairments:  Abnormal gait, Pain, Postural dysfunction, Decreased mobility, Decreased activity tolerance, Decreased endurance, Decreased range of motion, Decreased strength, Impaired UE functional use, Impaired flexibility, Difficulty walking, Decreased safety awareness,  Decreased balance  Visit Diagnosis: Muscle weakness (generalized)  Difficulty in walking, not elsewhere classified  Unsteadiness on feet     Problem List Patient Active Problem List   Diagnosis Date Noted  . MDS (myelodysplastic syndrome) ([place]) 05/12/2016  . B12 deficiency 05/04/2016  . Essential hypertension 05/04/2016  . Mixed stress and urge urinary incontinence 05/04/2016  . Rheumatoid arthritis involving multiple sites with positive rheumatoid factor ([place]) 05/04/2016    Jerl Mina ,PT, DPT, E-RYT  07/27/2016, 10:33 AM  [place] MAIN [facility] SERVICES [address] [place], [place], [address] Phone: [phone]   Fax:  [phone]  Name: Sylvia Miller MRN: [id number] Date of Birth: September 24, 1940

## 2016-08-01 ENCOUNTER — Ambulatory Visit: Payer: Medicare Other | Admitting: Physical Therapy

## 2016-08-01 ENCOUNTER — Ambulatory Visit: Payer: Self-pay | Admitting: Physical Therapy

## 2016-08-01 DIAGNOSIS — M6281 Muscle weakness (generalized): Secondary | ICD-10-CM | POA: Diagnosis not present

## 2016-08-01 DIAGNOSIS — R2681 Unsteadiness on feet: Secondary | ICD-10-CM

## 2016-08-01 DIAGNOSIS — R262 Difficulty in walking, not elsewhere classified: Secondary | ICD-10-CM

## 2016-08-01 NOTE — Therapy (Signed)
[place] MAIN [facility] SERVICES [address] [place], [place], [address] Phone: [phone]   Fax:  [phone]  Physical Therapy Treatment  Patient Details  Name: Sylvia Miller MRN: [id number] Date of Birth: 1940/11/30 Referring Provider: Dr. Netty Starring  Encounter Date: 08/01/2016      PT End of Session - 08/01/16 1049    Visit Number 9   Number of Visits 17   Date for PT Re-Evaluation 09/27/16   Authorization Type gcodes 8/10   PT Start Time 0820   PT Stop Time 0905   PT Time Calculation (min) 45 min   Equipment Utilized During Treatment Gait belt   Activity Tolerance Patient limited by fatigue;Patient limited by pain   Behavior During Therapy [facility] for tasks assessed/performed      Past Medical History:  Diagnosis Date  . Allergy   . Arthritis    rhematoid /knees/hands/shoulders (Right shoulder)   . GERD (gastroesophageal reflux disease)   . Hypertension    controlled with meds;   Marland Kitchen MDS (myelodysplastic syndrome) ([place]) 2000  . Ulcer     Past Surgical History:  Procedure Laterality Date  . ABDOMINAL HYSTERECTOMY    . CHOLECYSTECTOMY      There were no vitals filed for this visit.      Subjective Assessment - 08/01/16 1046    Subjective Pt reported she has not practiced any exercsies at home   Pertinent History personal factors affecting rehab: RA, MDS, high fall risk, 2 story home but able to live on main floor, has supportive caregiver;    Limitations Standing;Walking   How long can you stand comfortably? <5 min;    How long can you walk comfortably? with walker can walk >500 feet, slow    Diagnostic tests Had X-rays hip/knee for rheumatologists, unsure of results;    Patient Stated Goals "feel confident walking without falling" Strike to walk without walker if possible;    Pain Onset More than a month ago                     Adult Aquatic Therapy - 08/01/16 1048      Aquatic Therapy Subjective    Subjective Pt tolerated a more semi reclined position when floating on noodles with support of her neck by PT . More supine position caused neck pain. Exercises were modified immediately following complaint.        O: Pt entered/exited the pool via ramp with BUE support on rail. Sidestepping with less cues. Pt demo'd less pulling on rail with BUE  50 ft =1 lap  Exercises performed in 3'6" depth   3 way hip ext with BUE on rail  10 reps B LE . Cued for propioception  Seated stretches:  5' Leg, wrist  circles,  LAQ  hip abd/ ER/ add   Sit to stand 10reps     4 laps floating on back  with noodles under neck,armpits, back, thigh: UE/LE abd/add "snow angels"  PT support neck, noodles positioned to put pt in semi reclined position to decrease neck pain  1 lap sidestepping  L (report of knee pain)  Modified sidestepping: pt sidestepped  1 laps R,  2 laps, L and R , leaning against bench, BUE on bench with shoulder depression, cued for hip ER/abd. Pt reported no pain  Seated chest rows with noodle  Seated rest break 5'  PT Long Term Goals - 07/27/16 1032      PT LONG TERM GOAL #1   Title Patient will be independent in home exercise program to improve strength/mobility for better functional independence with ADLs.   Time 8   Period Weeks   Status On-going     PT LONG TERM GOAL #2   Title Patient will increase 10 meter walk test to > 0.8 m/s as to improve gait speed for better community ambulation and to reduce fall risk.   Time 8   Period Weeks   Status On-going     PT LONG TERM GOAL #3   Title Patient will increase ABC scale score >70% to demonstrate better functional mobility and better confidence with ADLs.    Time 8   Period Weeks   Status On-going     PT LONG TERM GOAL #4   Title Patient will increase BLE gross strength to 4+/5 as to improve functional strength for independent gait, increased standing tolerance and increased ADL ability.    Time 8   Period Weeks   Status On-going     PT LONG TERM GOAL #5   Title Patient will be modified independent in walking on even/uneven surface with least restrictive assistive device, for 20+ minutes without rest break, reporting 5/10 pain in LE's or less to improve walking tolerance with community ambulation including grocery shopping, going to church,etc.    Time 8   Period Weeks   Status On-going               Plan - 08/01/16 1050    Clinical Impression Statement Pt continues to progress well with hip abduction exercises in loaded and gravity eliminated positions in the water. Pt required less cues for shoulder depression and shows a less hunched position in gait. Pt tolerated a more semi reclined position more than a supine position when floating on noodles and required support o her neck by PT to minimize neck pain. Pt continues to benefit from skilled PT. Encouraged pt to practice HEP  because pool exercises that were selected were also applicable on land. Pt 's grandson was present at session today.    Rehab Potential Fair   Clinical Impairments Affecting Rehab Potential positive: good caregiver support, no recent history of falls; negative: co-morbidities; Patient's clinical presentation is evolving as she has severe pain that fluctuates in multiple joints, high fall risk;    PT Frequency 2x / week   PT Duration 8 weeks   PT Treatment/Interventions Aquatic Therapy;Cryotherapy;Moist Heat;Gait training;Stair training;Functional mobility training;Therapeutic activities;Therapeutic exercise;Patient/family education;Orthotic Fit/Training;Neuromuscular re-education;Balance training;Manual techniques;Taping;Energy conservation;Dry needling;Passive range of motion   PT Next Visit Plan advance HEP   PT Home Exercise Plan continue HEP as prescribed.   Consulted and Agree with Plan of Care Patient      Patient will benefit from skilled therapeutic intervention in order to improve the  following deficits and impairments:  Abnormal gait, Pain, Postural dysfunction, Decreased mobility, Decreased activity tolerance, Decreased endurance, Decreased range of motion, Decreased strength, Impaired UE functional use, Impaired flexibility, Difficulty walking, Decreased safety awareness, Decreased balance  Visit Diagnosis: Muscle weakness (generalized)  Difficulty in walking, not elsewhere classified  Unsteadiness on feet     Problem List Patient Active Problem List   Diagnosis Date Noted  . MDS (myelodysplastic syndrome) ([place]) 05/12/2016  . B12 deficiency 05/04/2016  . Essential hypertension 05/04/2016  . Mixed stress and urge urinary incontinence 05/04/2016  . Rheumatoid arthritis involving multiple sites with positive rheumatoid  factor ([place]) 05/04/2016    Jerl Mina ,PT, DPT, E-RYT  08/01/2016, 10:55 AM  [place] MAIN [facility] SERVICES [address] [place], [place], [address] Phone: [phone]   Fax:  [phone]  Name: Annalisa Colonna MRN: [id number] Date of Birth: Feb 15, 1941

## 2016-08-03 ENCOUNTER — Ambulatory Visit: Payer: Self-pay | Admitting: Physical Therapy

## 2016-08-08 ENCOUNTER — Ambulatory Visit: Payer: Medicare Other | Attending: Family Medicine | Admitting: Physical Therapy

## 2016-08-08 DIAGNOSIS — R262 Difficulty in walking, not elsewhere classified: Secondary | ICD-10-CM | POA: Diagnosis present

## 2016-08-08 DIAGNOSIS — R2681 Unsteadiness on feet: Secondary | ICD-10-CM | POA: Diagnosis present

## 2016-08-08 DIAGNOSIS — M6281 Muscle weakness (generalized): Secondary | ICD-10-CM | POA: Diagnosis not present

## 2016-08-08 NOTE — Therapy (Signed)
[place] MAIN [facility] SERVICES [address] [place], [place], [address] Phone: [phone]   Fax:  [phone]  Physical Therapy Treatment  Patient Details  Name: Sylvia Miller MRN: [id number] Date of Birth: [birth year] Referring Provider: Dr. Netty Starring  Encounter Date: 08/08/2016      PT End of Session - 08/08/16 1059    Visit Number 10   Number of Visits 17   Date for PT Re-Evaluation 09/27/16   Authorization Type gcodes 10/10   PT Start Time 0950   PT Stop Time 1025   PT Time Calculation (min) 35 min   Equipment Utilized During Treatment Gait belt   Activity Tolerance Patient limited by fatigue;Patient limited by pain   Behavior During Therapy [facility] for tasks assessed/performed      Past Medical History:  Diagnosis Date  . Allergy   . Arthritis    rhematoid /knees/hands/shoulders (Right shoulder)   . GERD (gastroesophageal reflux disease)   . Hypertension    controlled with meds;   Marland Kitchen MDS (myelodysplastic syndrome) ([place]) 2000  . Ulcer     Past Surgical History:  Procedure Laterality Date  . ABDOMINAL HYSTERECTOMY    . CHOLECYSTECTOMY      There were no vitals filed for this visit.      Subjective Assessment - 08/08/16 1057    Subjective Pt reported she felt tired because she [place] up [place] and her familia did alot yesterday   Pertinent History personal factors affecting rehab: RA, MDS, high fall risk, 2 story home but able to live on main floor, has supportive caregiver;    Limitations Standing;Walking   How long can you stand comfortably? <5 min;    How long can you walk comfortably? with walker can walk >500 feet, slow    Diagnostic tests Had X-rays hip/knee for rheumatologists, unsure of results;    Patient Stated Goals "feel confident walking without falling" Strike to walk without walker if possible;    Pain Onset More than a month ago           Aquatic Therapy Subjective    Subjective Pt tolerated a more semi reclined position when floating on noodles with support of her neck by PT . More supine position caused neck pain. Exercises were modified immediately following complaint.       O: Pt entered/exited the pool via ramp with BUE support on rail. Sidestepping with less cues. Pt demo'd less pulling on rail with BUE  50 ft =1 lap  Exercises performed in 3'6" depth     4 laps sidestepping close to bench with two noodles under armpits  2 laps walking backwards  2  laps with noodles under neck,armpits:  Leg abd/add   1 lap sidestepping  L (report of knee pain)  Modified sidestepping: pt sidestepped  1 laps R,  2 laps, L and R , leaning against bench, BUE on bench with shoulder depression, cued for hip ER/abd. Pt reported no pain  Sit to stand 10reps   3 way hip ext with BUE on rail  10 reps B LE . Cued for propioception  Seated stretches:  5' Leg, wrist  circles,  LAQ  hip abd/ ER/ add   Seated rest break 5'                 PT Long Term Goals - 07/27/16 1032      PT LONG TERM GOAL #1   Title  Patient will be independent in home exercise program to improve strength/mobility for better functional independence with ADLs.   Time 8   Period Weeks   Status On-going     PT LONG TERM GOAL #2   Title Patient will increase 10 meter walk test to > 0.8 m/s as to improve gait speed for better community ambulation and to reduce fall risk.   Time 8   Period Weeks   Status On-going     PT LONG TERM GOAL #3   Title Patient will increase ABC scale score >70% to demonstrate better functional mobility and better confidence with ADLs.    Time 8   Period Weeks   Status On-going     PT LONG TERM GOAL #4   Title Patient will increase BLE gross strength to 4+/5 as to improve functional strength for independent gait, increased standing tolerance and increased ADL ability.   Time 8   Period Weeks   Status On-going     PT LONG TERM  GOAL #5   Title Patient will be modified independent in walking on even/uneven surface with least restrictive assistive device, for 20+ minutes without rest break, reporting 5/10 pain in LE's or less to improve walking tolerance with community ambulation including grocery shopping, going to church,etc.    Time 8   Period Weeks   Status On-going               Plan - 2016/08/29 1100    Clinical Impression Statement Pt continues to tolerate progressions of gait training and upright hip/LE strengthening exercises. Pt reported feeling tired today due to lack of sleep and increased activities yesterday with her family.  BP was taken pre Tx: 130/80, post Tx: 150/80 mm Hg after 10 min seated rest break, 150/80 mm Hg after ~20 min post Tx.  Pt denied blurried vision, headache, SOB, LOB. Pt showed no red flag signs. Advised pt's husband and pt that higher BP is normal after exercise but it is important to monitor her BP and if it continues to stay high to get medical attention.Pt and husband voiced understanding. At next sessions, plan to address feet mechanics and toe abduction for improved balance.  Pt continues to benefit from skilled PT.     Rehab Potential Fair   Clinical Impairments Affecting Rehab Potential positive: good caregiver support, no recent history of falls; negative: co-morbidities; Patient's clinical presentation is evolving as she has severe pain that fluctuates in multiple joints, high fall risk;    PT Frequency 2x / week   PT Duration 8 weeks   PT Treatment/Interventions Aquatic Therapy;Cryotherapy;Moist Heat;Gait training;Stair training;Functional mobility training;Therapeutic activities;Therapeutic exercise;Patient/family education;Orthotic Fit/Training;Neuromuscular re-education;Balance training;Manual techniques;Taping;Energy conservation;Dry needling;Passive range of motion   PT Next Visit Plan advance HEP   PT Home Exercise Plan continue HEP as prescribed.   Consulted and  Agree with Plan of Care Patient      Patient will benefit from skilled therapeutic intervention in order to improve the following deficits and impairments:  Abnormal gait, Pain, Postural dysfunction, Decreased mobility, Decreased activity tolerance, Decreased endurance, Decreased range of motion, Decreased strength, Impaired UE functional use, Impaired flexibility, Difficulty walking, Decreased safety awareness, Decreased balance  Visit Diagnosis: Muscle weakness (generalized)  Difficulty in walking, not elsewhere classified  Unsteadiness on feet       G-Codes - August 29, 2016 1104    Functional Assessment Tool Used (Outpatient Only) clinical judgement , less difficulty with sit to stand, less UE support with ramp walking  Functional Limitation Mobility: Walking and moving around   Mobility: Walking and Moving Around Current Status [phone]) At least 20 percent but less than 40 percent impaired, limited or restricted   Mobility: Walking and Moving Around Goal Status [phone]) At least 20 percent but less than 40 percent impaired, limited or restricted      Problem List Patient Active Problem List   Diagnosis Date Noted  . MDS (myelodysplastic syndrome) ([place]) 05/12/2016  . B12 deficiency 05/04/2016  . Essential hypertension 05/04/2016  . Mixed stress and urge urinary incontinence 05/04/2016  . Rheumatoid arthritis involving multiple sites with positive rheumatoid factor ([place]) 05/04/2016    Jerl Mina ,PT, DPT, E-RYT  08/08/2016, 11:05 AM  [place] MAIN [facility] SERVICES [address] [place], [place], [address] Phone: [phone]   Fax:  [phone]  Name: Sylvia Miller MRN: [id number] Date of Birth: Mar 02, 1941

## 2016-08-10 ENCOUNTER — Ambulatory Visit: Payer: Medicare Other | Admitting: Physical Therapy

## 2016-08-15 ENCOUNTER — Ambulatory Visit: Payer: Medicare Other | Admitting: Physical Therapy

## 2016-08-15 DIAGNOSIS — R262 Difficulty in walking, not elsewhere classified: Secondary | ICD-10-CM

## 2016-08-15 DIAGNOSIS — R2681 Unsteadiness on feet: Secondary | ICD-10-CM

## 2016-08-15 DIAGNOSIS — M6281 Muscle weakness (generalized): Secondary | ICD-10-CM

## 2016-08-15 NOTE — Patient Instructions (Signed)
Lateral scoots along couch ( low bench)  10 reps left, 10 reps right x 4 sets both directions   Feet on floor, knees slightly out  Use hands on bench   ______________  Coordination of deep core mm with bridges:   10 bridges , lift hips on exhale  X 2 sets   10 bridges, lift hips on exhale, lower hips and then open knees apart  X 2 sets

## 2016-08-15 NOTE — Therapy (Signed)
[place] MAIN [facility] SERVICES [address] [place], [place], [address] Phone: [phone]   Fax:  [phone]  Physical Therapy Treatment  Patient Details  Name: Sylvia Miller MRN: [id number] Date of Birth: [birth year] Referring Provider: Dr. Netty Starring  Encounter Date: 08/15/2016      PT End of Session - 08/15/16 1642    Visit Number 11   Number of Visits 17   Date for PT Re-Evaluation 09/21/16   Authorization Type gcodes 11/10   PT Start Time 1045   PT Stop Time 1118   PT Time Calculation (min) 33 min   Equipment Utilized During Treatment Gait belt   Activity Tolerance Patient limited by fatigue;Patient limited by pain   Behavior During Therapy [facility] for tasks assessed/performed      Past Medical History:  Diagnosis Date  . Allergy   . Arthritis    rhematoid /knees/hands/shoulders (Right shoulder)   . GERD (gastroesophageal reflux disease)   . Hypertension    controlled with meds;   Marland Kitchen MDS (myelodysplastic syndrome) ([place]) 2000  . Ulcer     Past Surgical History:  Procedure Laterality Date  . ABDOMINAL HYSTERECTOMY    . CHOLECYSTECTOMY      There were no vitals filed for this visit.      Subjective Assessment - 08/15/16 1636    Subjective Pt reports she will traveling for several weeks and will not be able to come to PT   Pertinent History personal factors affecting rehab: RA, MDS, high fall risk, 2 story home but able to live on main floor, has supportive caregiver;    Limitations Standing;Walking   How long can you stand comfortably? <5 min;    How long can you walk comfortably? with walker can walk >500 feet, slow    Diagnostic tests Had X-rays hip/knee for rheumatologists, unsure of results;    Patient Stated Goals "feel confident walking without falling" Strike to walk without walker if possible;    Pain Onset More than a month ago            [facility] PT Assessment - 08/15/16 1637      Sit to Stand   Comments  less cuing required. alignment and technique with good carry over, less difficutly on rise                      [facility] Adult PT Treatment/Exercise - 08/15/16 1636      Therapeutic Activites    Therapeutic Activities --  see pt instructions     Neuro Re-ed    Neuro Re-ed Details  see pt instructions                PT Education - 08/15/16 1642    Education provided Yes   Education Details HEP   Person(s) Educated Patient   Methods Explanation;Demonstration;Tactile cues;Verbal cues;Handout   Comprehension Returned demonstration;Verbalized understanding             PT Long Term Goals - 08/15/16 1646      PT LONG TERM GOAL #1   Title Patient will be independent in home exercise program to improve strength/mobility for better functional independence with ADLs.   Time 8   Period Weeks   Status On-going     PT LONG TERM GOAL #2   Title Patient will increase 10 meter walk test to > 0.8 m/s as to improve gait speed for better community ambulation and to reduce fall risk.  Time 8   Period Weeks   Status On-going     PT LONG TERM GOAL #3   Title Patient will increase ABC scale score >70% to demonstrate better functional mobility and better confidence with ADLs.    Time 8   Period Weeks   Status On-going     PT LONG TERM GOAL #4   Title Patient will increase BLE gross strength to 4+/5 as to improve functional strength for independent gait, increased standing tolerance and increased ADL ability.   Time 8   Period Weeks   Status On-going     PT LONG TERM GOAL #5   Title Patient will be modified independent in walking on even/uneven surface with least restrictive assistive device, for 20+ minutes without rest break, reporting 5/10 pain in LE's or less to improve walking tolerance with community ambulation including grocery shopping, going to church,etc.    Time 8   Period Weeks   Status On-going               Plan - 08/15/16 1643    Clinical  Impression Statement Pt demo'd signifcantly improved sit to stand t/f with no cuing required. Pt progressed to land HEP with minimal load on her knee joints while strengthening her hip abductors and glut mm.  Lowered cane to minimize upper trap overuse. Pt demo'd steady gait with cane at proper adjustment.  Pt was encouraged to perform HEP while on her  out of town trip.  Pt continues to benefit from skilled PT.    Rehab Potential Fair   Clinical Impairments Affecting Rehab Potential positive: good caregiver support, no recent history of falls; negative: co-morbidities; Patient's clinical presentation is evolving as she has severe pain that fluctuates in multiple joints, high fall risk;    PT Frequency 2x / week   PT Duration 8 weeks   PT Treatment/Interventions Aquatic Therapy;Cryotherapy;Moist Heat;Gait training;Stair training;Functional mobility training;Therapeutic activities;Therapeutic exercise;Patient/family education;Orthotic Fit/Training;Neuromuscular re-education;Balance training;Manual techniques;Taping;Energy conservation;Dry needling;Passive range of motion   PT Next Visit Plan advance HEP   PT Home Exercise Plan continue HEP as prescribed.   Consulted and Agree with Plan of Care Patient      Patient will benefit from skilled therapeutic intervention in order to improve the following deficits and impairments:  Abnormal gait, Pain, Postural dysfunction, Decreased mobility, Decreased activity tolerance, Decreased endurance, Decreased range of motion, Decreased strength, Impaired UE functional use, Impaired flexibility, Difficulty walking, Decreased safety awareness, Decreased balance  Visit Diagnosis: No diagnosis found.     Problem List Patient Active Problem List   Diagnosis Date Noted  . MDS (myelodysplastic syndrome) ([place]) 05/12/2016  . B12 deficiency 05/04/2016  . Essential hypertension 05/04/2016  . Mixed stress and urge urinary incontinence 05/04/2016  . Rheumatoid  arthritis involving multiple sites with positive rheumatoid factor ([place]) 05/04/2016    Jerl Mina ,PT, DPT, E-RYT  08/15/2016, 4:52 PM  [place] [facility] SERVICES [address] [place], [place], [address] Phone: [phone]   Fax:  [phone]  Name: Sylvia Miller MRN: [id number] Date of Birth: Aug 15, 1940

## 2016-08-17 ENCOUNTER — Inpatient Hospital Stay: Payer: Medicare Other | Admitting: Oncology

## 2016-08-17 ENCOUNTER — Inpatient Hospital Stay: Payer: Medicare Other

## 2016-08-17 DIAGNOSIS — E7800 Pure hypercholesterolemia, unspecified: Secondary | ICD-10-CM | POA: Insufficient documentation

## 2016-08-29 ENCOUNTER — Ambulatory Visit: Payer: Medicare Other | Admitting: Physical Therapy

## 2017-02-06 ENCOUNTER — Other Ambulatory Visit: Payer: Self-pay | Admitting: Neurology

## 2017-02-06 DIAGNOSIS — R413 Other amnesia: Secondary | ICD-10-CM

## 2017-02-08 ENCOUNTER — Encounter
Admission: RE | Admit: 2017-02-08 | Discharge: 2017-02-08 | Disposition: A | Discharge status: Home / Self Care | Payer: Medicare Other | Source: Ambulatory Visit | Attending: Orthopedic Surgery | Admitting: Orthopedic Surgery

## 2017-02-08 ENCOUNTER — Encounter: Payer: Self-pay | Admitting: *Deleted

## 2017-02-08 DIAGNOSIS — M1731 Unilateral post-traumatic osteoarthritis, right knee: Secondary | ICD-10-CM | POA: Diagnosis not present

## 2017-02-08 DIAGNOSIS — Z7982 Long term (current) use of aspirin: Secondary | ICD-10-CM | POA: Insufficient documentation

## 2017-02-08 DIAGNOSIS — Z79899 Other long term (current) drug therapy: Secondary | ICD-10-CM | POA: Diagnosis not present

## 2017-02-08 DIAGNOSIS — I10 Essential (primary) hypertension: Secondary | ICD-10-CM | POA: Insufficient documentation

## 2017-02-08 DIAGNOSIS — Z01818 Encounter for other preprocedural examination: Secondary | ICD-10-CM | POA: Diagnosis not present

## 2017-02-08 HISTORY — DX: Dyspnea, unspecified: R06.00

## 2017-02-08 HISTORY — DX: Cardiac murmur, unspecified: R01.1

## 2017-02-08 HISTORY — DX: Unspecified urinary incontinence: R32

## 2017-02-08 LAB — CBC
HEMATOCRIT: 35.9 % (ref 35.0–47.0)
HEMOGLOBIN: 12.1 g/dL (ref 12.0–16.0)
MCH: 34.4 pg — AB (ref 26.0–34.0)
MCHC: 33.7 g/dL (ref 32.0–36.0)
MCV: 102.3 fL — ABNORMAL HIGH (ref 80.0–100.0)
Platelets: 161 10*3/uL (ref 150–440)
RBC: 3.51 MIL/uL — ABNORMAL LOW (ref 3.80–5.20)
RDW: 13.3 % (ref 11.5–14.5)
WBC: 3.4 10*3/uL — ABNORMAL LOW (ref 3.6–11.0)

## 2017-02-08 LAB — TYPE AND SCREEN
ABO/RH(D): O NEG
Antibody Screen: NEGATIVE

## 2017-02-08 LAB — PROTIME-INR
INR: 1.05
Prothrombin Time: 13.6 seconds (ref 11.4–15.2)

## 2017-02-08 LAB — URINALYSIS, ROUTINE W REFLEX MICROSCOPIC
Bilirubin Urine: NEGATIVE
Glucose, UA: NEGATIVE mg/dL
HGB URINE DIPSTICK: NEGATIVE
Ketones, ur: NEGATIVE mg/dL
LEUKOCYTES UA: NEGATIVE
Nitrite: NEGATIVE
Protein, ur: NEGATIVE mg/dL
SPECIFIC GRAVITY, URINE: 1.025 (ref 1.005–1.030)
pH: 5 (ref 5.0–8.0)

## 2017-02-08 LAB — SURGICAL PCR SCREEN
MRSA, PCR: NEGATIVE
STAPHYLOCOCCUS AUREUS: NEGATIVE

## 2017-02-08 LAB — COMPREHENSIVE METABOLIC PANEL
ALBUMIN: 4.1 g/dL (ref 3.5–5.0)
ALK PHOS: 127 U/L — AB (ref 38–126)
ALT: 16 U/L (ref 14–54)
ANION GAP: 10 (ref 5–15)
AST: 25 U/L (ref 15–41)
BILIRUBIN TOTAL: 0.7 mg/dL (ref 0.3–1.2)
BUN: 20 mg/dL (ref 6–20)
CALCIUM: 9.1 mg/dL (ref 8.9–10.3)
CO2: 24 mmol/L (ref 22–32)
CREATININE: 0.63 mg/dL (ref 0.44–1.00)
Chloride: 103 mmol/L (ref 101–111)
GFR calc non Af Amer: >60 mL/min (ref >60)
GLUCOSE: 80 mg/dL (ref 65–99)
Potassium: 3.5 mmol/L (ref 3.5–5.1)
Sodium: 137 mmol/L (ref 135–145)
TOTAL PROTEIN: 6.8 g/dL (ref 6.5–8.1)

## 2017-02-08 LAB — APTT: aPTT: 35 seconds (ref 24–36)

## 2017-02-08 LAB — C-REACTIVE PROTEIN

## 2017-02-08 LAB — SEDIMENTATION RATE: SED RATE: 34 mm/h — AB (ref 0–30)

## 2017-02-08 NOTE — Patient Instructions (Signed)
Your procedure is scheduled on: 02/21/17 Wed Report to Same Day Surgery 2nd floor medical mall [facility] Entrance-take elevator on left to 2nd floor.  Check in with surgery information desk.) To find out your arrival time please call [phone] between 1PM - 3PM on 02/20/17 Tues  Remember: Instructions that are not followed completely may result in serious medical risk, up to and including death, or upon the discretion of your surgeon and anesthesiologist your surgery may need to be rescheduled.    _x___ 1. Do not eat food after midnight the night before your procedure. You may drink clear liquids up to 2 hours before you are scheduled to arrive at the hospital for your procedure.  Do not drink clear liquids within 2 hours of your scheduled arrival to the hospital.  Clear liquids include  --Water or Apple juice without pulp  --Clear carbohydrate beverage such as ClearFast or Gatorade  --Black Coffee or Clear Tea (No milk, no creamers, do not add anything to                  the coffee or Tea Type 1 and type 2 diabetics should only drink water.  No gum chewing or hard candies.     __x__ 2. No Alcohol for 24 hours before or after surgery.   __x__3. No Smoking for 24 prior to surgery.   ____  4. Bring all medications with you on the day of surgery if instructed.    __x__ 5. Notify your doctor if there is any change in your medical condition     (cold, fever, infections).     Do not wear jewelry, make-up, hairpins, clips or nail polish.  Do not wear lotions, powders, or perfumes. You may wear deodorant.  Do not shave 48 hours prior to surgery. Men may shave face and neck.  Do not bring valuables to the hospital.    [facility] is not responsible for any belongings or valuables.               Contacts, dentures or bridgework may not be worn into surgery.  Leave your suitcase in the car. After surgery it may be brought to your room.  For patients admitted to the hospital,  discharge time is determined by your                       treatment team.   Patients discharged the day of surgery will not be allowed to drive home.  You will need someone to drive you home and stay with you the night of your procedure.    Please read over the following fact sheets that you were given:   [facility] Preparing for Surgery and or MRSA Information   _x___ Take anti-hypertensive listed below, cardiac, seizure, asthma,     anti-reflux and psychiatric medicines. These include:  1. atorvastatin (LIPITOR) 10 MG tablet  2.pantoprazole (PROTONIX) 40 MG tablet  3.  4.  5.  6.  ____Fleets enema or Magnesium Citrate as directed.   _x___ Use CHG Soap or sage wipes as directed on instruction sheet   ____ Use inhalers on the day of surgery and bring to hospital day of surgery  ____ Stop Metformin and Janumet 2 days prior to surgery.    ____ Take 1/2 of usual insulin dose the night before surgery and none on the morning     surgery.   _x___ Follow recommendations from Cardiologist, Pulmonologist or PCP regarding  stopping Aspirin, Coumadin, Plavix ,Eliquis, Effient, or Pradaxa, and Pletal.  Stop aspirin per instruction of physician  X____Stop Anti-inflammatories such as Advil, Aleve, Ibuprofen, Motrin, Naproxen, Naprosyn, Goodies powders or aspirin products. OK to take Tylenol and                          Celebrex.  Stop Meloxicam 1 week before surgery.   _x___ Stop supplements until after surgery.  But may continue Vitamin D, Vitamin B,       and multivitamin.   ____ Bring C-Pap to the hospital.

## 2017-02-10 LAB — URINE CULTURE: Special Requests: NORMAL

## 2017-02-12 ENCOUNTER — Ambulatory Visit: Admission: RE | Admit: 2017-02-12 | Payer: Medicare Other | Source: Ambulatory Visit

## 2017-02-12 NOTE — Pre-Procedure Instructions (Signed)
Abnormal labs faxed to Dr [facility] office

## 2017-02-13 ENCOUNTER — Ambulatory Visit: Admission: RE | Admit: 2017-02-13 | Payer: Medicare Other | Source: Ambulatory Visit | Admitting: Gastroenterology

## 2017-02-13 ENCOUNTER — Encounter: Admission: RE | Payer: Self-pay | Source: Ambulatory Visit

## 2017-02-13 SURGERY — COLONOSCOPY WITH PROPOFOL
Anesthesia: General

## 2017-02-20 MED ORDER — CEFAZOLIN SODIUM-DEXTROSE 2-4 GM/100ML-% IV SOLN: 2.0000 g | INTRAVENOUS | Status: DC

## 2017-02-20 MED ORDER — TRANEXAMIC ACID 1000 MG/10ML IV SOLN
1000.0000 mg | INTRAVENOUS | Status: DC
  Filled 2017-02-20: qty 10

## 2017-02-21 ENCOUNTER — Encounter
Admission: RE | Disposition: A | Discharge status: Skilled Nursing Facility | Payer: Self-pay | Source: Ambulatory Visit | Attending: Orthopedic Surgery

## 2017-02-21 ENCOUNTER — Inpatient Hospital Stay: Payer: Medicare Other | Admitting: Anesthesiology

## 2017-02-21 ENCOUNTER — Inpatient Hospital Stay
Admission: RE | Admit: 2017-02-21 | Discharge: 2017-02-24 | DRG: 470 | Disposition: A | Discharge status: Skilled Nursing Facility | Payer: Medicare Other | Source: Ambulatory Visit | Attending: Orthopedic Surgery | Admitting: Orthopedic Surgery

## 2017-02-21 ENCOUNTER — Inpatient Hospital Stay: Payer: Medicare Other

## 2017-02-21 ENCOUNTER — Encounter: Payer: Self-pay | Admitting: Orthopedic Surgery

## 2017-02-21 DIAGNOSIS — I10 Essential (primary) hypertension: Secondary | ICD-10-CM | POA: Diagnosis present

## 2017-02-21 DIAGNOSIS — K219 Gastro-esophageal reflux disease without esophagitis: Secondary | ICD-10-CM | POA: Diagnosis present

## 2017-02-21 DIAGNOSIS — G7100 Muscular dystrophy, unspecified: Secondary | ICD-10-CM | POA: Diagnosis present

## 2017-02-21 DIAGNOSIS — M069 Rheumatoid arthritis, unspecified: Secondary | ICD-10-CM | POA: Diagnosis present

## 2017-02-21 DIAGNOSIS — Z7982 Long term (current) use of aspirin: Secondary | ICD-10-CM | POA: Diagnosis not present

## 2017-02-21 DIAGNOSIS — M1711 Unilateral primary osteoarthritis, right knee: Secondary | ICD-10-CM | POA: Diagnosis present

## 2017-02-21 DIAGNOSIS — D469 Myelodysplastic syndrome, unspecified: Secondary | ICD-10-CM | POA: Diagnosis present

## 2017-02-21 DIAGNOSIS — Z96651 Presence of right artificial knee joint: Secondary | ICD-10-CM

## 2017-02-21 DIAGNOSIS — Z96659 Presence of unspecified artificial knee joint: Secondary | ICD-10-CM

## 2017-02-21 HISTORY — PX: KNEE ARTHROPLASTY: SHX992

## 2017-02-21 LAB — ABO/RH: ABO/RH(D): O NEG

## 2017-02-21 SURGERY — ARTHROPLASTY, KNEE, TOTAL, USING IMAGELESS COMPUTER-ASSISTED NAVIGATION
Anesthesia: Spinal | Site: Knee | Laterality: Right | Wound class: Clean

## 2017-02-21 MED ORDER — ALUM & MAG HYDROXIDE-SIMETH 200-200-20 MG/5ML PO SUSP: 30.0000 mL | ORAL | Status: DC | PRN

## 2017-02-21 MED ORDER — MORPHINE SULFATE (PF) 2 MG/ML IV SOLN
2.0000 mg | INTRAVENOUS | Status: DC | PRN
Start: 2017-02-21 — End: 2017-02-24

## 2017-02-21 MED ORDER — ONDANSETRON HCL 4 MG/2ML IJ SOLN: 4.0000 mg | Freq: Once | INTRAMUSCULAR | Status: DC | PRN

## 2017-02-21 MED ORDER — FLEET ENEMA 7-19 GM/118ML RE ENEM: 1.0000 | ENEMA | Freq: Once | RECTAL | Status: DC | PRN

## 2017-02-21 MED ORDER — TETRACAINE HCL 1 % IJ SOLN
INTRAMUSCULAR | Status: AC
  Filled 2017-02-21: qty 2

## 2017-02-21 MED ORDER — CEFAZOLIN SODIUM-DEXTROSE 2-3 GM-%(50ML) IV SOLR
INTRAVENOUS | Status: DC | PRN
  Administered 2017-02-21: 2 g via INTRAVENOUS

## 2017-02-21 MED ORDER — CELECOXIB 200 MG PO CAPS
200.0000 mg | ORAL_CAPSULE | Freq: Two times a day (BID) | ORAL | Status: DC
  Administered 2017-02-21 – 2017-02-24 (×6): 200 mg via ORAL
  Filled 2017-02-21 (×7): qty 1

## 2017-02-21 MED ORDER — PANTOPRAZOLE SODIUM 40 MG PO TBEC
40.0000 mg | DELAYED_RELEASE_TABLET | Freq: Two times a day (BID) | ORAL | Status: DC
  Administered 2017-02-21 – 2017-02-24 (×6): 40 mg via ORAL
  Filled 2017-02-21 (×6): qty 1

## 2017-02-21 MED ORDER — OMEGA-3-ACID ETHYL ESTERS 1 G PO CAPS
1.0000 | ORAL_CAPSULE | Freq: Every day | ORAL | Status: DC
  Administered 2017-02-21 – 2017-02-23 (×3): 1 g via ORAL
  Filled 2017-02-21 (×3): qty 1

## 2017-02-21 MED ORDER — DIPHENHYDRAMINE HCL 12.5 MG/5ML PO ELIX: 12.5000 mg | ORAL_SOLUTION | ORAL | Status: DC | PRN

## 2017-02-21 MED ORDER — ARTIFICIAL TEARS OPHTHALMIC OINT
TOPICAL_OINTMENT | OPHTHALMIC | Status: DC | PRN
  Filled 2017-02-21: qty 3.5

## 2017-02-21 MED ORDER — SODIUM CHLORIDE 0.9 % IV SOLN
INTRAVENOUS | Status: DC | PRN
  Administered 2017-02-21: 20 ug/min via INTRAVENOUS

## 2017-02-21 MED ORDER — LISINOPRIL 5 MG PO TABS
5.0000 mg | ORAL_TABLET | Freq: Every day | ORAL | Status: DC
  Administered 2017-02-21 – 2017-02-24 (×4): 5 mg via ORAL
  Filled 2017-02-21 (×4): qty 1

## 2017-02-21 MED ORDER — MIDAZOLAM HCL 2 MG/2ML IJ SOLN
INTRAMUSCULAR | Status: AC
  Filled 2017-02-21: qty 2

## 2017-02-21 MED ORDER — SODIUM CHLORIDE 0.9 % IV SOLN
1000.0000 mg | Freq: Once | INTRAVENOUS | Status: AC
  Administered 2017-02-21: 1000 mg via INTRAVENOUS
  Filled 2017-02-21: qty 10

## 2017-02-21 MED ORDER — SODIUM CHLORIDE 0.9 % IV SOLN
INTRAVENOUS | Status: DC
  Administered 2017-02-22: 100 mL/h via INTRAVENOUS
  Administered 2017-02-22: 09:00:00 via INTRAVENOUS

## 2017-02-21 MED ORDER — BUPIVACAINE LIPOSOME 1.3 % IJ SUSP
INTRAMUSCULAR | Status: AC
  Filled 2017-02-21: qty 20

## 2017-02-21 MED ORDER — VITAMIN B-12 1000 MCG PO TABS
5000.0000 ug | ORAL_TABLET | Freq: Every day | ORAL | Status: DC
  Administered 2017-02-21 – 2017-02-24 (×4): 5000 ug via ORAL
  Filled 2017-02-21 (×4): qty 5

## 2017-02-21 MED ORDER — MIDAZOLAM HCL 5 MG/5ML IJ SOLN
INTRAMUSCULAR | Status: DC | PRN
  Administered 2017-02-21 (×2): 0.5 mg via INTRAVENOUS
  Administered 2017-02-21: 1 mg via INTRAVENOUS

## 2017-02-21 MED ORDER — LIDOCAINE HCL (PF) 2 % IJ SOLN
INTRAMUSCULAR | Status: AC
  Filled 2017-02-21: qty 10

## 2017-02-21 MED ORDER — LACTATED RINGERS IV SOLN
INTRAVENOUS | Status: DC
  Administered 2017-02-21 (×2): via INTRAVENOUS

## 2017-02-21 MED ORDER — NEOMYCIN-POLYMYXIN B GU 40-200000 IR SOLN
Status: AC
  Filled 2017-02-21: qty 20

## 2017-02-21 MED ORDER — FAMOTIDINE 20 MG PO TABS
ORAL_TABLET | ORAL | Status: AC
  Filled 2017-02-21: qty 1

## 2017-02-21 MED ORDER — GLYCOPYRROLATE 0.2 MG/ML IJ SOLN
INTRAMUSCULAR | Status: AC
  Filled 2017-02-21: qty 1

## 2017-02-21 MED ORDER — ACETAMINOPHEN 325 MG PO TABS: 650.0000 mg | ORAL_TABLET | ORAL | Status: DC | PRN

## 2017-02-21 MED ORDER — ENOXAPARIN SODIUM 30 MG/0.3ML ~~LOC~~ SOLN
30.0000 mg | Freq: Two times a day (BID) | SUBCUTANEOUS | Status: DC
  Administered 2017-02-22 – 2017-02-24 (×5): 30 mg via SUBCUTANEOUS
  Filled 2017-02-21 (×5): qty 0.3

## 2017-02-21 MED ORDER — MAGNESIUM HYDROXIDE 400 MG/5ML PO SUSP
30.0000 mL | Freq: Every day | ORAL | Status: DC | PRN
  Administered 2017-02-23: 30 mL via ORAL
  Filled 2017-02-21: qty 30

## 2017-02-21 MED ORDER — ACETAMINOPHEN 650 MG RE SUPP: 650.0000 mg | RECTAL | Status: DC | PRN

## 2017-02-21 MED ORDER — TETRACAINE HCL 1 % IJ SOLN
INTRAMUSCULAR | Status: DC | PRN
  Administered 2017-02-21: 5 mg via INTRASPINAL

## 2017-02-21 MED ORDER — ONDANSETRON HCL 4 MG/2ML IJ SOLN: 4.0000 mg | Freq: Four times a day (QID) | INTRAMUSCULAR | Status: DC | PRN

## 2017-02-21 MED ORDER — FOLIC ACID 1 MG PO TABS
1000.0000 ug | ORAL_TABLET | Freq: Every day | ORAL | Status: DC
  Administered 2017-02-21 – 2017-02-24 (×4): 1 mg via ORAL
  Filled 2017-02-21 (×4): qty 1

## 2017-02-21 MED ORDER — DEXTROSE 5 % IV SOLN
2.0000 g | Freq: Four times a day (QID) | INTRAVENOUS | Status: AC
  Administered 2017-02-21 – 2017-02-22 (×4): 2 g via INTRAVENOUS
  Filled 2017-02-21 (×4): qty 2000

## 2017-02-21 MED ORDER — SODIUM CHLORIDE 0.9 % IV SOLN
INTRAVENOUS | Status: DC | PRN
  Administered 2017-02-21: 60 mL

## 2017-02-21 MED ORDER — LIDOCAINE HCL (CARDIAC) 20 MG/ML IV SOLN
INTRAVENOUS | Status: DC | PRN
  Administered 2017-02-21: 60 mg via INTRAVENOUS

## 2017-02-21 MED ORDER — ACETAMINOPHEN 10 MG/ML IV SOLN
INTRAVENOUS | Status: DC | PRN
  Administered 2017-02-21: 1000 mg via INTRAVENOUS

## 2017-02-21 MED ORDER — BUPIVACAINE IN DEXTROSE 0.75-8.25 % IT SOLN
INTRATHECAL | Status: DC | PRN
  Administered 2017-02-21: 1.6 mL via INTRATHECAL

## 2017-02-21 MED ORDER — BUPIVACAINE HCL (PF) 0.25 % IJ SOLN
INTRAMUSCULAR | Status: DC | PRN
  Administered 2017-02-21: 60 mL

## 2017-02-21 MED ORDER — VITAMIN D 1000 UNITS PO TABS
5000.0000 [IU] | ORAL_TABLET | Freq: Every day | ORAL | Status: DC
  Administered 2017-02-21 – 2017-02-24 (×4): 5000 [IU] via ORAL
  Filled 2017-02-21 (×4): qty 5

## 2017-02-21 MED ORDER — OXYCODONE HCL 5 MG PO TABS
10.0000 mg | ORAL_TABLET | ORAL | Status: DC | PRN
  Administered 2017-02-21 – 2017-02-23 (×4): 10 mg via ORAL
  Filled 2017-02-21 (×4): qty 2

## 2017-02-21 MED ORDER — SENNOSIDES-DOCUSATE SODIUM 8.6-50 MG PO TABS
1.0000 | ORAL_TABLET | Freq: Two times a day (BID) | ORAL | Status: DC
  Administered 2017-02-21 – 2017-02-24 (×6): 1 via ORAL
  Filled 2017-02-21 (×6): qty 1

## 2017-02-21 MED ORDER — FENTANYL CITRATE (PF) 100 MCG/2ML IJ SOLN
INTRAMUSCULAR | Status: DC | PRN
  Administered 2017-02-21 (×4): 25 ug via INTRAVENOUS

## 2017-02-21 MED ORDER — FENTANYL CITRATE (PF) 100 MCG/2ML IJ SOLN: 25.0000 ug | INTRAMUSCULAR | Status: DC | PRN

## 2017-02-21 MED ORDER — BUPIVACAINE HCL (PF) 0.25 % IJ SOLN
INTRAMUSCULAR | Status: AC
  Filled 2017-02-21: qty 60

## 2017-02-21 MED ORDER — ATORVASTATIN CALCIUM 10 MG PO TABS
5.0000 mg | ORAL_TABLET | Freq: Every day | ORAL | Status: DC
  Administered 2017-02-21 – 2017-02-24 (×4): 5 mg via ORAL
  Filled 2017-02-21 (×4): qty 1

## 2017-02-21 MED ORDER — PHENYLEPHRINE HCL 10 MG/ML IJ SOLN
INTRAMUSCULAR | Status: AC
  Filled 2017-02-21: qty 1

## 2017-02-21 MED ORDER — METOCLOPRAMIDE HCL 10 MG PO TABS
10.0000 mg | ORAL_TABLET | Freq: Three times a day (TID) | ORAL | Status: AC
  Administered 2017-02-21 – 2017-02-23 (×8): 10 mg via ORAL
  Filled 2017-02-21 (×8): qty 1

## 2017-02-21 MED ORDER — PROPOFOL 500 MG/50ML IV EMUL
INTRAVENOUS | Status: DC | PRN
  Administered 2017-02-21: 35 ug/kg/min via INTRAVENOUS

## 2017-02-21 MED ORDER — CHLORHEXIDINE GLUCONATE 4 % EX LIQD: 60.0000 mL | Freq: Once | CUTANEOUS | Status: DC

## 2017-02-21 MED ORDER — ONDANSETRON HCL 4 MG PO TABS: 4.0000 mg | ORAL_TABLET | Freq: Four times a day (QID) | ORAL | Status: DC | PRN

## 2017-02-21 MED ORDER — SODIUM CHLORIDE 0.9 % IV SOLN
INTRAVENOUS | Status: DC | PRN
  Administered 2017-02-21: 1000 mg via INTRAVENOUS

## 2017-02-21 MED ORDER — TRAMADOL HCL 50 MG PO TABS
50.0000 mg | ORAL_TABLET | ORAL | Status: DC | PRN
  Administered 2017-02-22: 100 mg via ORAL
  Filled 2017-02-21: qty 2

## 2017-02-21 MED ORDER — DIPHENHYDRAMINE HCL 25 MG PO CAPS
25.0000 mg | ORAL_CAPSULE | Freq: Four times a day (QID) | ORAL | Status: DC | PRN
Start: 2017-02-21 — End: 2017-02-24

## 2017-02-21 MED ORDER — CEFAZOLIN SODIUM-DEXTROSE 2-4 GM/100ML-% IV SOLN: 2.0000 g | Freq: Four times a day (QID) | INTRAVENOUS | Status: DC

## 2017-02-21 MED ORDER — BISACODYL 10 MG RE SUPP: 10.0000 mg | Freq: Every day | RECTAL | Status: DC | PRN

## 2017-02-21 MED ORDER — PROPOFOL 10 MG/ML IV BOLUS
INTRAVENOUS | Status: DC | PRN
  Administered 2017-02-21: 20 mg via INTRAVENOUS
  Administered 2017-02-21: 40 mg via INTRAVENOUS

## 2017-02-21 MED ORDER — OCUVITE-LUTEIN PO CAPS
1.0000 | ORAL_CAPSULE | Freq: Every day | ORAL | Status: DC
  Administered 2017-02-21 – 2017-02-24 (×3): 1 via ORAL
  Filled 2017-02-21 (×4): qty 1

## 2017-02-21 MED ORDER — FERROUS SULFATE 325 (65 FE) MG PO TABS
325.0000 mg | ORAL_TABLET | Freq: Two times a day (BID) | ORAL | Status: DC
  Administered 2017-02-22 – 2017-02-24 (×5): 325 mg via ORAL
  Filled 2017-02-21 (×5): qty 1

## 2017-02-21 MED ORDER — OXYCODONE HCL 5 MG PO TABS
5.0000 mg | ORAL_TABLET | ORAL | Status: DC | PRN
  Filled 2017-02-21: qty 1

## 2017-02-21 MED ORDER — NEOMYCIN-POLYMYXIN B GU 40-200000 IR SOLN
Status: DC | PRN
  Administered 2017-02-21: 12 mL
  Administered 2017-02-21: 2 mL

## 2017-02-21 MED ORDER — ACETAMINOPHEN 10 MG/ML IV SOLN
1000.0000 mg | Freq: Four times a day (QID) | INTRAVENOUS | Status: AC
  Administered 2017-02-22 (×4): 1000 mg via INTRAVENOUS
  Filled 2017-02-21 (×4): qty 100

## 2017-02-21 MED ORDER — GLYCOPYRROLATE 0.2 MG/ML IJ SOLN
INTRAMUSCULAR | Status: DC | PRN
  Administered 2017-02-21: 0.2 mg via INTRAVENOUS

## 2017-02-21 MED ORDER — ACETAMINOPHEN 10 MG/ML IV SOLN
INTRAVENOUS | Status: AC
  Filled 2017-02-21: qty 100

## 2017-02-21 MED ORDER — CEFAZOLIN SODIUM-DEXTROSE 2-4 GM/100ML-% IV SOLN
INTRAVENOUS | Status: AC
  Filled 2017-02-21: qty 100

## 2017-02-21 MED ORDER — OXYBUTYNIN CHLORIDE ER 5 MG PO TB24
10.0000 mg | ORAL_TABLET | Freq: Every day | ORAL | Status: DC
  Administered 2017-02-21 – 2017-02-24 (×4): 10 mg via ORAL
  Filled 2017-02-21 (×4): qty 2

## 2017-02-21 MED ORDER — FENTANYL CITRATE (PF) 100 MCG/2ML IJ SOLN
INTRAMUSCULAR | Status: AC
  Filled 2017-02-21: qty 2

## 2017-02-21 MED ORDER — MENTHOL 3 MG MT LOZG
1.0000 | LOZENGE | OROMUCOSAL | Status: DC | PRN
  Filled 2017-02-21: qty 9

## 2017-02-21 MED ORDER — PROPOFOL 500 MG/50ML IV EMUL
INTRAVENOUS | Status: AC
  Filled 2017-02-21: qty 100

## 2017-02-21 MED ORDER — PHENOL 1.4 % MT LIQD
1.0000 | OROMUCOSAL | Status: DC | PRN
  Filled 2017-02-21: qty 177

## 2017-02-21 SURGICAL SUPPLY — 63 items
BATTERY INSTRU NAVIGATION (MISCELLANEOUS) ×12 IMPLANT
BLADE SAW 1 (BLADE) ×3 IMPLANT
BLADE SAW 1/2 (BLADE) ×3 IMPLANT
BLADE SAW 70X12.5 (BLADE) IMPLANT
CANISTER SUCT 1200ML W/VALVE (MISCELLANEOUS) ×3 IMPLANT
CANISTER SUCT 3000ML PPV (MISCELLANEOUS) ×6 IMPLANT
CAPT KNEE TOTAL 3 ATTUNE ×3 IMPLANT
CEMENT HV SMART SET (Cement) ×6 IMPLANT
COOLER POLAR GLACIER W/PUMP (MISCELLANEOUS) ×3 IMPLANT
CUFF TOURN 24 STER (MISCELLANEOUS) IMPLANT
CUFF TOURN 30 STER DUAL PORT (MISCELLANEOUS) IMPLANT
DRAPE SHEET LG 3/4 BI-LAMINATE (DRAPES) ×3 IMPLANT
DRSG DERMACEA 8X12 NADH (GAUZE/BANDAGES/DRESSINGS) ×3 IMPLANT
DRSG OPSITE POSTOP 4X14 (GAUZE/BANDAGES/DRESSINGS) ×3 IMPLANT
DRSG TEGADERM 4X4.75 (GAUZE/BANDAGES/DRESSINGS) ×3 IMPLANT
DURAPREP 26ML APPLICATOR (WOUND CARE) ×6 IMPLANT
ELECT CAUTERY BLADE 6.4 (BLADE) ×3 IMPLANT
ELECT REM PT RETURN 9FT ADLT (ELECTROSURGICAL) ×3
ELECTRODE REM PT RTRN 9FT ADLT (ELECTROSURGICAL) ×1 IMPLANT
EVACUATOR 1/8 PVC DRAIN (DRAIN) ×3 IMPLANT
EX-PIN ORTHOLOCK NAV 4X150 (PIN) ×6 IMPLANT
GLOVE BIOGEL M STRL SZ7.5 (GLOVE) ×6 IMPLANT
GLOVE BIOGEL PI IND STRL 9 (GLOVE) ×1 IMPLANT
GLOVE BIOGEL PI INDICATOR 9 (GLOVE) ×2
GLOVE INDICATOR 8.0 STRL GRN (GLOVE) ×3 IMPLANT
GLOVE SURG SYN 9.0  PF PI (GLOVE) ×2
GLOVE SURG SYN 9.0 PF PI (GLOVE) ×1 IMPLANT
GOWN STRL REUS W/ TWL LRG LVL3 (GOWN DISPOSABLE) ×2 IMPLANT
GOWN STRL REUS W/TWL 2XL LVL3 (GOWN DISPOSABLE) ×3 IMPLANT
GOWN STRL REUS W/TWL LRG LVL3 (GOWN DISPOSABLE) ×4
HOLDER FOLEY CATH W/STRAP (MISCELLANEOUS) ×3 IMPLANT
HOOD PEEL AWAY FLYTE STAYCOOL (MISCELLANEOUS) ×6 IMPLANT
KIT RM TURNOVER STRD PROC AR (KITS) ×3 IMPLANT
KNIFE SCULPS 14X20 (INSTRUMENTS) ×3 IMPLANT
LABEL OR SOLS (LABEL) ×3 IMPLANT
NDL SAFETY ECLIPSE 18X1.5 (NEEDLE) ×1 IMPLANT
NEEDLE HYPO 18GX1.5 SHARP (NEEDLE) ×2
NEEDLE SPNL 20GX3.5 QUINCKE YW (NEEDLE) ×6 IMPLANT
NS IRRIG 500ML POUR BTL (IV SOLUTION) ×3 IMPLANT
PACK TOTAL KNEE (MISCELLANEOUS) ×3 IMPLANT
PAD WRAPON POLAR KNEE (MISCELLANEOUS) ×1 IMPLANT
PIN FIXATION 1/8DIA X 3INL (PIN) ×3 IMPLANT
PULSAVAC PLUS IRRIG FAN TIP (DISPOSABLE) ×3
SOL .9 NS 3000ML IRR  AL (IV SOLUTION) ×2
SOL .9 NS 3000ML IRR UROMATIC (IV SOLUTION) ×1 IMPLANT
SOL PREP PVP 2OZ (MISCELLANEOUS) ×3
SOLUTION PREP PVP 2OZ (MISCELLANEOUS) ×1 IMPLANT
SPONGE DRAIN TRACH 4X4 STRL 2S (GAUZE/BANDAGES/DRESSINGS) ×3 IMPLANT
STAPLER SKIN PROX 35W (STAPLE) ×3 IMPLANT
STOCKINETTE BIAS CUT 6 980064 (GAUZE/BANDAGES/DRESSINGS) ×3 IMPLANT
STRAP TIBIA SHORT (MISCELLANEOUS) ×3 IMPLANT
SUCTION FRAZIER HANDLE 10FR (MISCELLANEOUS) ×2
SUCTION TUBE FRAZIER 10FR DISP (MISCELLANEOUS) ×1 IMPLANT
SUT VIC AB 0 CT1 36 (SUTURE) ×3 IMPLANT
SUT VIC AB 1 CT1 36 (SUTURE) ×6 IMPLANT
SUT VIC AB 2-0 CT2 27 (SUTURE) ×3 IMPLANT
SYR 20CC LL (SYRINGE) ×3 IMPLANT
SYR 30ML LL (SYRINGE) ×6 IMPLANT
TIP FAN IRRIG PULSAVAC PLUS (DISPOSABLE) ×1 IMPLANT
TOWEL OR 17X26 4PK STRL BLUE (TOWEL DISPOSABLE) ×3 IMPLANT
TOWER CARTRIDGE SMART MIX (DISPOSABLE) ×3 IMPLANT
TRAY FOLEY W/METER SILVER 16FR (SET/KITS/TRAYS/PACK) ×3 IMPLANT
WRAPON POLAR PAD KNEE (MISCELLANEOUS) ×3

## 2017-02-21 NOTE — Discharge Instructions (Signed)
°  Instructions after Total Knee Replacement ° ° Meric Joye P. Leilany Digeronimo, Jr., M.D.    ° Dept. of Orthopaedics & Sports Medicine ° [facility] ° [address] ° [place], [place]  [address] ° Phone: [phone]   Fax: [phone] ° °  °DIET: °• Drink plenty of non-alcoholic fluids. °• Resume your normal diet. Include foods high in fiber. ° °ACTIVITY:  °• You may use crutches or a walker with weight-bearing as tolerated, unless instructed otherwise. °• You may be weaned off of the walker or crutches by your Physical Therapist.  °• Do NOT place pillows under the knee. Anything placed under the knee could limit your ability to straighten the knee.   °• Continue doing gentle exercises. Exercising will reduce the pain and swelling, increase motion, and prevent muscle weakness.   °• Please continue to use the TED compression stockings for 6 weeks. You may remove the stockings at night, but should reapply them in the morning. °• Do not drive or operate any equipment until instructed. ° °WOUND CARE:  °• Continue to use the PolarCare or ice packs periodically to reduce pain and swelling. °• You may bathe or shower after the staples are removed at the first office visit following surgery. ° °MEDICATIONS: °• You may resume your regular medications. °• Please take the pain medication as prescribed on the medication. °• Do not take pain medication on an empty stomach. °• You have been given a prescription for a blood thinner (Lovenox or Coumadin). Please take the medication as instructed. (NOTE: After completing a 2 week course of Lovenox, take one Enteric-coated aspirin once a day. This along with elevation will help reduce the possibility of phlebitis in your operated leg.) °• Do not drive or drink alcoholic beverages when taking pain medications. ° °CALL THE OFFICE FOR: °• Temperature above 101 degrees °• Excessive bleeding or drainage on the dressing. °• Excessive swelling, coldness, or paleness of the toes. °• Persistent  nausea and vomiting. ° °FOLLOW-UP:  °• You should have an appointment to return to the office in 10-14 days after surgery. °• Arrangements have been made for continuation of Physical Therapy (either home therapy or outpatient therapy). °  °

## 2017-02-21 NOTE — Transfer of Care (Signed)
Immediate Anesthesia Transfer of Care Note  Patient: Sylvia Miller  Procedure(s) Performed: COMPUTER ASSISTED TOTAL KNEE ARTHROPLASTY (Right Knee)  Patient Location: PACU  Anesthesia Type:Spinal  Level of Consciousness: awake, alert , oriented and patient cooperative  Airway & Oxygen Therapy: Patient Spontanous Breathing  Post-op Assessment: Report given to RN and Post -op Vital signs reviewed and stable  Post vital signs: Reviewed and stable  Last Vitals:  Vitals:   02/21/17 1341  BP: (!) 188/66  Pulse: 69  Resp: 17  Temp: 36.8 C  SpO2: 95%    Last Pain:  Vitals:   02/21/17 1341  TempSrc: Tympanic  PainSc: 4          Complications: No apparent anesthesia complications

## 2017-02-21 NOTE — Op Note (Signed)
OPERATIVE NOTE  DATE OF SURGERY:  02/21/2017  PATIENT NAME:  Sylvia Miller   DOB: [birth year]  MRN: [id number]  PRE-OPERATIVE DIAGNOSIS: Degenerative arthrosis of the right knee, primary  POST-OPERATIVE DIAGNOSIS:  Same  PROCEDURE:  Right total knee arthroplasty using computer-assisted navigation  SURGEON:  Marciano Sequin. M.D.  ASSISTANT:  Vance Peper, PA (present and scrubbed throughout the case, critical for assistance with exposure, retraction, instrumentation, and closure)  ANESTHESIA: spinal  ESTIMATED BLOOD LOSS: 50 mL  FLUIDS REPLACED: 1300 mL of crystalloid  TOURNIQUET TIME: 111 minutes  DRAINS: 2 medium Hemovac drains  SOFT TISSUE RELEASES: Anterior cruciate ligament, posterior cruciate ligament, deep medial collateral ligament, patellofemoral ligament, posterolateral corner  IMPLANTS UTILIZED: DePuy Attune size 3 posterior stabilized femoral component (cemented), size 3 rotating platform tibial component (cemented), 32 mm medialized dome patella (cemented), and a 10 mm stabilized rotating platform polyethylene insert.  INDICATIONS FOR SURGERY: Sylvia Miller is a 76 y.o. year old female with a long history of progressive knee pain. X-rays demonstrated severe degenerative changes in tricompartmental fashion. The patient had not seen any significant improvement despite conservative nonsurgical intervention. After discussion of the risks and benefits of surgical intervention, the patient expressed understanding of the risks benefits and agree with plans for total knee arthroplasty.   The risks, benefits, and alternatives were discussed at length including but not limited to the risks of infection, bleeding, nerve injury, stiffness, blood clots, the need for revision surgery, cardiopulmonary complications, among others, and they were willing to proceed.  PROCEDURE IN DETAIL: The patient was brought into the operating room and, after adequate spinal anesthesia was achieved, a  tourniquet was placed on the patient's upper thigh. The patient's knee and leg were cleaned and prepped with alcohol and DuraPrep and draped in the usual sterile fashion. A "timeout" was performed as per usual protocol. The lower extremity was exsanguinated using an Esmarch, and the tourniquet was inflated to 300 mmHg. An anterior longitudinal incision was made followed by a standard mid vastus approach. The deep fibers of the medial collateral ligament were elevated in a subperiosteal fashion off of the medial flare of the tibia so as to maintain a continuous soft tissue sleeve. The patella was subluxed laterally and the patellofemoral ligament was incised. Inspection of the knee demonstrated severe degenerative changes with full-thickness loss of articular cartilage. Osteophytes were debrided using a rongeur. Anterior and posterior cruciate ligaments were excised. Two 4.0 mm Schanz pins were inserted in the femur and into the tibia for attachment of the array of trackers used for computer-assisted navigation. Hip center was identified using a circumduction technique. Distal landmarks were mapped using the computer. The distal femur and proximal tibia were mapped using the computer. The distal femoral cutting guide was positioned using computer-assisted navigation so as to achieve a 5 distal valgus cut. The femur was sized and it was felt that a size 3 femoral component was appropriate. A size 3 femoral cutting guide was positioned and the anterior cut was performed and verified using the computer. This was followed by completion of the posterior and chamfer cuts. Femoral cutting guide for the central box was then positioned in the center box cut was performed.  Attention was then directed to the proximal tibia. Medial and lateral menisci were excised. The extramedullary tibial cutting guide was positioned using computer-assisted navigation so as to achieve a 0 varus-valgus alignment and 3 posterior slope. The  cut was performed and verified using the computer. The proximal tibia  was sized and it was felt that a size 3 tibial tray was appropriate. Tibial and femoral trials were inserted followed by insertion of an 8 mm polyethylene insert.  The knee was felt to be tight laterally.  The trial components were removed and the knee was brought in full extension and distracted using the [place]land retractors.  The posterolateral corner was carefully released using a combination of electrocautery and Metzenbaum scissors.  The trial components were reinserted followed by insertion of a 10 mm polyethylene trial. This allowed for excellent mediolateral soft tissue balancing both in flexion and in full extension. Finally, the patella was cut and prepared so as to accommodate a 32 mm medialized dome patella. A patella trial was placed and the knee was placed through a range of motion with excellent patellar tracking appreciated. The femoral trial was removed after debridement of posterior osteophytes. The central post-hole for the tibial component was reamed followed by insertion of a keel punch. Tibial trials were then removed. Cut surfaces of bone were irrigated with copious amounts of normal saline with antibiotic solution using pulsatile lavage and then suctioned dry.  A defect in the anteromedial aspect of the tibial plateau was noted that appeared to be a previous "tunnel" with nonabsorbable sutures present.  The defect was curettaged and then a wedge of bone was created from 1 of the previously resected condyles and impacted into place so as to fill the defect.  Polymethylmethacrylate cement with gentamicin was prepared in the usual fashion using a vacuum mixer. Cement was applied to the cut surface of the proximal tibia as well as along the undersurface of a size 3 rotating platform tibial component. Tibial component was positioned and impacted into place. Excess cement was removed using [employer] fast streamer. Cement was then applied  to the cut surfaces of the femur as well as along the posterior flanges of the size 3 femoral component. The femoral component was positioned and impacted into place. Excess cement was removed using [employer] fast streamer. A 10 mm polyethylene trial was inserted and the knee was brought into full extension with steady axial compression applied. Finally, cement was applied to the backside of a 32 mm medialized dome patella and the patellar component was positioned and patellar clamp applied. Excess cement was removed using [employer] fast streamer. After adequate curing of the cement, the tourniquet was deflated after a total tourniquet time of 111 minutes. Hemostasis was achieved using electrocautery. The knee was irrigated with copious amounts of normal saline with antibiotic solution using pulsatile lavage and then suctioned dry. 20 mL of 1.3% Exparel and 60 mL of 0.25% Marcaine in 40 mL of normal saline was injected along the posterior capsule, medial and lateral gutters, and along the arthrotomy site. A 10 mm stabilized rotating platform polyethylene insert was inserted and the knee was placed through a range of motion with excellent mediolateral soft tissue balancing appreciated and excellent patellar tracking noted. 2 medium drains were placed in the wound bed and brought out through separate stab incisions. The medial parapatellar portion of the incision was reapproximated using interrupted sutures of #1 Vicryl. Subcutaneous tissue was approximated in layers using first #0 Vicryl followed #2-0 Vicryl. The skin was approximated with skin staples. A sterile dressing was applied.  The patient tolerated the procedure well and was transported to the recovery room in stable condition.    James P. Holley Bouche., M.D.

## 2017-02-21 NOTE — Anesthesia Post-op Follow-up Note (Signed)
Anesthesia QCDR form completed.        

## 2017-02-21 NOTE — Anesthesia Preprocedure Evaluation (Signed)
Anesthesia Evaluation  Patient identified by MRN, date of birth, ID band Patient awake    Reviewed: Allergy & Precautions, NPO status , Patient's Chart, lab work & pertinent test results, reviewed documented beta blocker date and time   Airway Mallampati: III  TM Distance: >3 FB     Dental  (+) Chipped   Pulmonary shortness of breath,           Cardiovascular hypertension, Pt. on medications + Valvular Problems/Murmurs      Neuro/Psych    GI/Hepatic GERD  Controlled,  Endo/Other    Renal/GU      Musculoskeletal  (+) Arthritis , Rheumatoid disorders,    Abdominal   Peds  Hematology   Anesthesia Other Findings   Reproductive/Obstetrics                             Anesthesia Physical Anesthesia Plan  ASA: III  Anesthesia Plan: Spinal   Post-op Pain Management:    Induction:   PONV Risk Score and Plan:   Airway Management Planned:   Additional Equipment:   Intra-op Plan:   Post-operative Plan:   Informed Consent: I have reviewed the patients History and Physical, chart, labs and discussed the procedure including the risks, benefits and alternatives for the proposed anesthesia with the patient or authorized representative who has indicated his/her understanding and acceptance.     Plan Discussed with: CRNA  Anesthesia Plan Comments:         Anesthesia Quick Evaluation

## 2017-02-21 NOTE — H&P (Signed)
The patient has been re-examined, and the chart reviewed, and there have been no interval changes to the documented history and physical.    The risks, benefits, and alternatives have been discussed at length. The patient expressed understanding of the risks benefits and agreed with plans for surgical intervention.  James P. Hooten, Jr. M.D.    

## 2017-02-21 NOTE — Anesthesia Procedure Notes (Deleted)
Anesthesia Regional Block: Narrative:       

## 2017-02-21 NOTE — Anesthesia Procedure Notes (Signed)
Spinal  Patient location during procedure: OR Start time: 02/21/2017 3:55 PM End time: 02/21/2017 4:05 PM Staffing Resident/CRNA: Dionne Bucy, CRNA Performed: resident/CRNA  Preanesthetic Checklist Completed: patient identified, site marked, surgical consent, pre-op evaluation, timeout performed, IV checked, risks and benefits discussed and monitors and equipment checked Spinal Block Patient position: sitting Prep: ChloraPrep Patient monitoring: heart rate, continuous pulse ox and blood pressure Approach: midline Location: L4-5 Injection technique: single-shot Needle Needle type: Pencan  Needle gauge: 24 G Needle length: 10 cm Assessment Sensory level: T8 Additional Notes Negative paresthesia. Negative blood return. Positive free-flowing CSF. Expiration date of kit checked and confirmed. Patient tolerated procedure well, without complications.

## 2017-02-22 ENCOUNTER — Other Ambulatory Visit: Payer: Self-pay

## 2017-02-22 ENCOUNTER — Encounter
Admission: RE | Admit: 2017-02-22 | Discharge: 2017-02-22 | Disposition: A | Discharge status: Home / Self Care | Payer: Medicare Other | Source: Ambulatory Visit | Attending: Internal Medicine | Admitting: Internal Medicine

## 2017-02-22 ENCOUNTER — Encounter: Payer: Self-pay | Admitting: Orthopedic Surgery

## 2017-02-22 LAB — CBC
HCT: 31.8 % — ABNORMAL LOW (ref 35.0–47.0)
HEMOGLOBIN: 10.8 g/dL — AB (ref 12.0–16.0)
MCH: 35 pg — AB (ref 26.0–34.0)
MCHC: 34 g/dL (ref 32.0–36.0)
MCV: 102.9 fL — ABNORMAL HIGH (ref 80.0–100.0)
PLATELETS: 123 10*3/uL — AB (ref 150–440)
RBC: 3.09 MIL/uL — AB (ref 3.80–5.20)
RDW: 13.5 % (ref 11.5–14.5)
WBC: 4.5 10*3/uL (ref 3.6–11.0)

## 2017-02-22 LAB — BASIC METABOLIC PANEL
Anion gap: 6 (ref 5–15)
BUN: 15 mg/dL (ref 6–20)
CHLORIDE: 103 mmol/L (ref 101–111)
CO2: 24 mmol/L (ref 22–32)
Calcium: 8.7 mg/dL — ABNORMAL LOW (ref 8.9–10.3)
Creatinine, Ser: 0.7 mg/dL (ref 0.44–1.00)
Glucose, Bld: 115 mg/dL — ABNORMAL HIGH (ref 65–99)
POTASSIUM: 3.7 mmol/L (ref 3.5–5.1)
SODIUM: 133 mmol/L — AB (ref 135–145)

## 2017-02-22 MED ORDER — OXYCODONE HCL 5 MG PO TABS: 5.0000 mg | ORAL_TABLET | ORAL | 0 refills | Status: DC | PRN

## 2017-02-22 MED ORDER — TRAMADOL HCL 50 MG PO TABS: 50.0000 mg | ORAL_TABLET | ORAL | 0 refills | Status: DC | PRN

## 2017-02-22 MED ORDER — ENOXAPARIN SODIUM 30 MG/0.3ML ~~LOC~~ SOLN: 30.0000 mg | Freq: Two times a day (BID) | SUBCUTANEOUS | 0 refills | Status: DC

## 2017-02-22 NOTE — Progress Notes (Signed)
   Subjective: 1 Day Post-Op Procedure(s) (LRB): COMPUTER ASSISTED TOTAL KNEE ARTHROPLASTY (Right) Patient reports pain as 7 on 0-10 scale.   Patient is well, and has had no acute complaints or problems We will start therapy today.  Plan is to go Rehab after hospital stay. no nausea and no vomiting Patient denies any chest pains or shortness of breath. Objective: Vital signs in last 24 hours: Temp:  [97.5 F (36.4 C)-98.8 F (37.1 C)] 97.9 F (36.6 C) (12/20 0443) Pulse Rate:  [59-83] 62 (12/20 0443) Resp:  [12-20] 14 (12/19 2119) BP: (115-188)/(46-84) 118/49 (12/20 0443) SpO2:  [95 %-100 %] 95 % (12/20 0443) Weight:  [65.8 kg (145 lb)] 65.8 kg (145 lb) (12/19 1341) Heels are non tender and elevated off the bed using rolled towels along with bone foam under operative leg Intake/Output from previous day: 12/19 0701 - 12/20 0700 In: 2463.3 [P.O.:480; I.V.:1683.3; IV Piggyback:300] Out: 1040 [Urine:870; Drains:120; Blood:50] Intake/Output this shift: No intake/output data recorded.  Recent Labs    02/22/17 0458  HGB 10.8*   Recent Labs    02/22/17 0458  WBC 4.5  RBC 3.09*  HCT 31.8*  PLT 123*   Recent Labs    02/22/17 0458  NA 133*  K 3.7  CL 103  CO2 24  BUN 15  CREATININE 0.70  GLUCOSE 115*  CALCIUM 8.7*   No results for input(s): LABPT, INR in the last 72 hours.  EXAM General - Patient is Alert, Appropriate and Oriented Extremity - Neurologically intact Neurovascular intact Sensation intact distally Intact pulses distally Dorsiflexion/Plantar flexion intact Compartment soft Dressing - dressing C/D/I Motor Function - intact, moving foot and toes well on exam. Able to do straight leg raise on her own.  Past Medical History:  Diagnosis Date  . Allergy   . Arthritis    rhematoid /knees/hands/shoulders (Right shoulder)   . Bladder incontinence   . Dyspnea   . GERD (gastroesophageal reflux disease)   . Heart murmur   . Hypertension    controlled  with meds;   Marland Kitchen MDS (myelodysplastic syndrome) ([place]) 2000  . Ulcer     Assessment/Plan: 1 Day Post-Op Procedure(s) (LRB): COMPUTER ASSISTED TOTAL KNEE ARTHROPLASTY (Right) Active Problems:   S/P total knee arthroplasty  Estimated body mass index is 32.51 kg/m as calculated from the following:   Height as of this encounter: 4\' 8"  (1.422 m).   Weight as of this encounter: 65.8 kg (145 lb). Advance diet Up with therapy D/C IV fluids Discharge to SNF  Labs: Were reviewed DVT Prophylaxis - Lovenox, Foot Pumps and TED hose Weight-Bearing as tolerated to right leg D/C O2 and Pulse OX and try on Room Air Begin working on bowel movement  Jon R. Cayce Galena 02/22/2017, 7:21 AM

## 2017-02-22 NOTE — Evaluation (Signed)
Occupational Therapy Evaluation Patient Details Name: Sylvia Miller MRN: [id number] DOB: 21-Jan-1941 Today's Date: 02/22/2017    History of Present Illness Pt admitted for R TKR. Pt also has issues with L knee and will need replacement at some point.    Clinical Impression   Pt is 76  year old female s/p R TKR.  Pt was independent in all ADLs prior to surgery and is eager to return to PLOF. She has hx of rheumatoid arthritis and given resources to help manage and prevent joint pain when using AD for LB dressing.   Pt currently requires moderate assist for LB dressing for RLE and min assist for LLE using AD while in seated position due to pain and limited AROM of R knee.  Pt would benefit from instruction in dressing techniques with or without assistive devices for dressing and bathing skills.  Pt would also benefit from recommendations for home modifications to increase safety in the bathroom and prevent falls. Rec rehab continue at SNF prior to going home to regain strength and independence and prevent falls.    Follow Up Recommendations  SNF    Equipment Recommendations       Recommendations for Other Services       Precautions / Restrictions Precautions Precautions: Fall;Knee Precaution Booklet Issued: No Restrictions Weight Bearing Restrictions: Yes RLE Weight Bearing: Weight bearing as tolerated      Mobility Bed Mobility                  Transfers                      Balance                                           ADL either performed or assessed with clinical judgement   ADL Overall ADL's : Needs assistance/impaired Eating/Feeding: Independent;Set up   Grooming: Wash/dry hands;Wash/dry face;Oral care;Set up;Independent   Upper Body Bathing: Independent;Set up;Sitting   Lower Body Bathing: Moderate assistance;Set up;Supervison/ safety;With adaptive equipment Lower Body Bathing Details (indicate cue type and reason): Pt able  to complete LB dressing with reacher and sock aid for LLe only sitting at EOB with min assist and mod assist for bed mobility and don/doff LLE  Upper Body Dressing : Independent;Set up   Lower Body Dressing: Moderate assistance;Supervision/safety;Set up;With adaptive equipment Lower Body Dressing Details (indicate cue type and reason): Pt able to complete LB dressing with reacher and sock aid for LLe only sitting at EOB with min assist and mod assist for bed mobility and don/doff LLE                     Vision Baseline Vision/History: No visual deficits Patient Visual Report: No change from baseline       Perception     Praxis      Pertinent Vitals/Pain Pain Assessment: Faces Faces Pain Scale: Hurts a little bit Pain Location: R knee Pain Descriptors / Indicators: Operative site guarding Pain Intervention(s): Limited activity within patient's tolerance;Monitored during session;Premedicated before session;Ice applied     Hand Dominance Right   Extremity/Trunk Assessment Upper Extremity Assessment Upper Extremity Assessment: Generalized weakness   Lower Extremity Assessment Lower Extremity Assessment: Defer to PT evaluation       Communication Communication Communication: No difficulties   Cognition Arousal/Alertness: Awake/alert Behavior During Therapy: [facility]  for tasks assessed/performed Overall Cognitive Status: Within Functional Limits for tasks assessed                                     General Comments       Exercises     Shoulder Instructions      Home Living Family/patient expects to be discharged to:: Private residence Living Arrangements: Spouse/significant other Available Help at Discharge: Family Type of Home: House Home Access: Level entry     [place]: One level     Bathroom Shower/Tub: Teacher, early years/pre: Handicapped height Bathroom Accessibility: Yes How Accessible: Accessible via walker [place]: [place] - 4 wheels;Shower seat;Grab bars - toilet          Prior Functioning/Environment Level of Independence: Independent with assistive device(s)        Comments: used rollater for all mobility prior to surgery, only able to walk short distances. Independent in all ADLs.        OT Problem List: Decreased strength;Decreased range of motion;Pain;Impaired balance (sitting and/or standing);Decreased activity tolerance;Decreased knowledge of use of DME or AE      OT Treatment/Interventions: Self-care/ADL training;DME and/or AE instruction;Patient/family education;Balance training    OT Goals(Current goals can be found in the care plan section) Acute Rehab OT Goals Patient Stated Goal: "to be able to get myself dressed again" OT Goal Formulation: With patient/family Time For Goal Achievement: 03/08/17 Potential to Achieve Goals: Good ADL Goals Pt Will Perform Lower Body Dressing: with min assist;sit to/from stand;with set-up Pt Will Transfer to Toilet: with min assist;stand pivot transfer Pt Will Perform Toileting - Clothing Manipulation and hygiene: with min guard assist;sit to/from stand  OT Frequency: Min 1X/week   Barriers to D/C:            Co-evaluation              AM-PAC PT "6 Clicks" Daily Activity     Outcome Measure Help from another person eating meals?: None Help from another person taking care of personal grooming?: None Help from another person toileting, which includes using toliet, bedpan, or urinal?: A Little Help from another person bathing (including washing, rinsing, drying)?: A Little Help from another person to put on and taking off regular upper body clothing?: None Help from another person to put on and taking off regular lower body clothing?: A Little 6 Click Score: 21   End of Session Equipment Utilized During Treatment: Other (comment)(reacher and sock aid)  Activity Tolerance: Patient tolerated treatment well Patient left: in  bed;with call bell/phone within reach;with bed alarm set  OT Visit Diagnosis: Pain;Muscle weakness (generalized) (M62.81);Other abnormalities of gait and mobility (R26.89) Pain - Right/Left: Right Pain - part of body: Knee                Time: 1445-1530 OT Time Calculation (min): 45 min Charges:  OT General Charges $OT Visit: 1 Visit OT Evaluation $OT Eval Low Complexity: 1 Low OT Treatments $Self Care/Home Management : 23-37 mins G-Codes: OT G-codes **NOT FOR INPATIENT CLASS** Functional Assessment Tool Used: AM-PAC 6 Clicks Daily Activity;Clinical judgement Functional Limitation: Self care Self Care Current Status (O6767): At least 40 percent but less than 60 percent impaired, limited or restricted Self Care Goal Status (M0947): At least 20 percent but less than 40 percent impaired, limited or restricted   Chrys Racer, OTR/L ascom [phone] 02/22/17, 3:57  PM

## 2017-02-22 NOTE — Clinical Social Work Placement (Signed)
   CLINICAL SOCIAL WORK PLACEMENT  NOTE  Date:  02/22/2017  Patient Details  Name: Sylvia Miller MRN: [id number] Date of Birth: 1941/02/07  Clinical Social Work is seeking post-discharge placement for this patient at the [place] level of care (*CSW will initial, date and re-position this form in  chart as items are completed):  Yes   Patient/family provided with [place] Work Department's list of facilities offering this level of care within the geographic area requested by the patient (or if unable, by the patient's family).  Yes   Patient/family informed of their freedom to choose among providers that offer the needed level of care, that participate in Medicare, Medicaid or managed care program needed by the patient, have an available bed and are willing to accept the patient.  Yes   Patient/family informed of 's ownership interest in [facility] and [facility], as Miller as of the fact that they are under no obligation to receive care at these facilities.  PASRR submitted to EDS on 02/22/17     PASRR number received on 02/22/17     Existing PASRR number confirmed on       FL2 transmitted to all facilities in geographic area requested by pt/family on 02/22/17     FL2 transmitted to all facilities within larger geographic area on       Patient informed that his/her managed care company has contracts with or will negotiate with certain facilities, including the following:        Yes   Patient/family informed of bed offers received.  Patient chooses bed at       Physician recommends and patient chooses bed at      Patient to be transferred to   on  .  Patient to be transferred to facility by       Patient family notified on   of transfer.  Name of family member notified:        PHYSICIAN       Additional Comment:    _______________________________________________ Renee Beale, Veronia Beets, LCSW 02/22/2017, 3:01 PM

## 2017-02-22 NOTE — Progress Notes (Signed)
New Admission Note:   Arrival Method: per bed from PACU, post surgery, accompanied with 2 PACU RN's Mental Orientation: alert and oriented X4 Telemetry: none ordered Assessment: Completed Skin: warm, dry, with bruise noted on the left arm, honeycomb dressing on the right knee with hemovac, intact. Polar care running on the operative site. Sacral foam dressing intact. IV: G22 on the right forearm with transparent dressing, intact, fluids infusing well. Pain: 9/10 scale, surgical pain on the right knee. Will administer PRN pain medicine. Tubes: foley catheter in place, attached to standard urine bag draining well  Safety Measures: Safety Fall Prevention Plan has been given and discussed Admission: Completed 1A Orientation: Patient has been oriented to the room, unit and staff.  Family: spouse at bedside  Orders have been reviewed and implemented. Yellow socks on, fall risk armband applied, call light placed within reach and bed alarm activated. Will continue to monitor patient.   Georgeanna Harrison, RN

## 2017-02-22 NOTE — Clinical Social Work Note (Signed)
Clinical Social Work Assessment  Patient Details  Name: Sylvia Miller MRN: [id number] Date of Birth: 11/25/1940  Date of referral:  02/22/17               Reason for consult:  Facility Placement                Permission sought to share information with:  Chartered certified accountant granted to share information::  Yes, Verbal Permission Granted  Name::      [place]::   [place]   Relationship::     Contact Information:     Housing/Transportation Living arrangements for the past 2 months:  [place] of Information:  Patient, Spouse Patient Interpreter Needed:  None Criminal Activity/Legal Involvement Pertinent to Current Situation/Hospitalization:  No - Comment as needed Significant Relationships:  Adult Children, Spouse Lives with:  Spouse Do you feel safe going back to the place where you live?  Yes Need for family participation in patient care:  Yes (Comment)  Care giving concerns:  Patient lives in [place] with her husband [name].    Social Worker assessment / plan:  Holiday representative (CSW) received SNF consult. PT is recommending SNF. CSW met with patient alone at bedside to discuss D/C plan. CSW introduced self and explained role of CSW department. Patient was alert and oriented X4 and was sitting up in the chair at bedside. Per patient she lives in [place] with her husband [name]. Patient reported that her husband is writing a book. CSW explained SNF process and that medicare requires a 3 night qualifying inpatient stay in a hospital in order to pay for SNF. Patient was admitted to inpatient on 02/21/17. Patient verbalized her understanding and is agreeable to SNF search in [place]. FL2 complete and faxed out.   CSW presented bed offers to patient and her husband. Per patient they will review offers and get back to CSW. CSW will continue to follow and assist as needed.   Employment status:   Retired Forensic scientist:  [employer] PT Recommendations:  [place] / Referral to community resources:  [place]  Patient/Family's Response to care:  Patient is agreeable to [facility] in [place].   Patient/Family's Understanding of and Emotional Response to Diagnosis, Current Treatment, and Prognosis:  Patient was very pleasant and thanked CSW for assistance.   Emotional Assessment Appearance:  Appears stated age Attitude/Demeanor/Rapport:    Affect (typically observed):  Accepting, Adaptable, Pleasant Orientation:  Oriented to Self, Oriented to Place, Oriented to  Time, Oriented to Situation Alcohol / Substance use:  Not Applicable Psych involvement (Current and /or in the community):  No (Comment)  Discharge Needs  Concerns to be addressed:  Discharge Planning Concerns Readmission within the last 30 days:  No Current discharge risk:  Dependent with Mobility Barriers to Discharge:  Continued Medical Work up   [employer], Veronia Beets, LCSW 02/22/2017, 3:02 PM

## 2017-02-22 NOTE — NC FL2 (Signed)
[place] LEVEL OF CARE SCREENING TOOL     IDENTIFICATION  Patient Name: Sylvia Miller Birthdate: 04/19/1940 Sex: female Admission Date (Current Location): 02/21/2017  [place] and [place] Number:  Engineering geologist and Address:  [facility], [address] [place], [place] [address]      Provider Number: [id number]  Attending Physician Name and Address:  Dereck Leep, MD  Relative Name and Phone Number:       Current Level of Care: Hospital Recommended Level of Care: [place] Prior Approval Number:    Date Approved/Denied:   PASRR Number: [id number])  Discharge Plan: SNF    Current Diagnoses: Patient Active Problem List   Diagnosis Date Noted  . S/P total knee arthroplasty 02/21/2017  . MDS (myelodysplastic syndrome) ([place]) 05/12/2016  . B12 deficiency 05/04/2016  . Essential hypertension 05/04/2016  . Mixed stress and urge urinary incontinence 05/04/2016  . Rheumatoid arthritis involving multiple sites with positive rheumatoid factor ([place]) 05/04/2016    Orientation RESPIRATION BLADDER Height & Weight     Self, Time, Situation, Place  Normal Continent Weight: 145 lb (65.8 kg) Height:  4\' 8"  (142.2 cm)  BEHAVIORAL SYMPTOMS/MOOD NEUROLOGICAL BOWEL NUTRITION STATUS      Continent Diet(Diet: Regular )  AMBULATORY STATUS COMMUNICATION OF NEEDS Skin   Extensive Assist Verbally Surgical wounds(Incision: Right Knee. )                       Personal Care Assistance Level of Assistance  Bathing, Feeding, Dressing Bathing Assistance: Limited assistance Feeding assistance: Independent Dressing Assistance: Limited assistance     Functional Limitations Info  Sight, Hearing, Speech Sight Info: Adequate Hearing Info: Adequate Speech Info: Adequate    SPECIAL CARE FACTORS FREQUENCY  PT (By licensed PT), OT (By licensed OT)     PT Frequency: (5) OT Frequency: (5)            Contractures       Additional Factors Info  Code Status, Allergies Code Status Info: (Full Code. ) Allergies Info: (No Known Allergies. )           Current Medications (02/22/2017):  This is the current hospital active medication list Current Facility-Administered Medications  Medication Dose Route Frequency Provider Last Rate Last Dose  . 0.9 %  sodium chloride infusion   Intravenous Continuous Dereck Leep, MD 100 mL/hr at 02/22/17 0851    . acetaminophen (OFIRMEV) IV 1,000 mg  1,000 mg Intravenous Q6H Hooten, Laurice Record, MD   Stopped at 02/22/17 0631  . acetaminophen (TYLENOL) tablet 650 mg  650 mg Oral Q4H PRN Hooten, Laurice Record, MD       Or  . acetaminophen (TYLENOL) suppository 650 mg  650 mg Rectal Q4H PRN Hooten, Laurice Record, MD      . alum & mag hydroxide-simeth (MAALOX/MYLANTA) 200-200-20 MG/5ML suspension 30 mL  30 mL Oral Q4H PRN Hooten, Laurice Record, MD      . artificial tears (LACRILUBE) ophthalmic ointment   Both Eyes Q4H PRN Hooten, Laurice Record, MD      . atorvastatin (LIPITOR) tablet 5 mg  5 mg Oral Daily Hooten, Laurice Record, MD   5 mg at 02/22/17 [phone]  . bisacodyl (DULCOLAX) suppository 10 mg  10 mg Rectal Daily PRN Hooten, Laurice Record, MD      . ceFAZolin (ANCEF) 2 g in dextrose 5 % 100 mL IVPB  2 g Intravenous Q6H Hooten, Laurice Record, MD  200 mL/hr at 02/22/17 0847 2 g at 02/22/17 0847  . celecoxib (CELEBREX) capsule 200 mg  200 mg Oral Q12H Hooten, Laurice Record, MD   200 mg at 02/22/17 0844  . cholecalciferol (VITAMIN D) tablet 5,000 Units  5,000 Units Oral Daily Dereck Leep, MD   5,000 Units at 02/22/17 [phone]  . diphenhydrAMINE (BENADRYL) 12.5 MG/5ML elixir 12.5-25 mg  12.5-25 mg Oral Q4H PRN Hooten, Laurice Record, MD      . diphenhydrAMINE (BENADRYL) capsule 25 mg  25 mg Oral Q6H PRN Hooten, Laurice Record, MD      . enoxaparin (LOVENOX) injection 30 mg  30 mg Subcutaneous Q12H Hooten, Laurice Record, MD   30 mg at 02/22/17 0842  . ferrous sulfate tablet 325 mg  325 mg Oral BID WC Dereck Leep, MD   325 mg at 02/22/17 0843  .  folic acid (FOLVITE) tablet 1 mg  1,000 mcg Oral Daily Hooten, Laurice Record, MD   1 mg at 02/22/17 0843  . lisinopril (PRINIVIL,ZESTRIL) tablet 5 mg  5 mg Oral Daily Hooten, Laurice Record, MD   5 mg at 02/22/17 0843  . magnesium hydroxide (MILK OF MAGNESIA) suspension 30 mL  30 mL Oral Daily PRN Hooten, Laurice Record, MD      . menthol-cetylpyridinium (CEPACOL) lozenge 3 mg  1 lozenge Oral PRN Hooten, Laurice Record, MD       Or  . phenol (CHLORASEPTIC) mouth spray 1 spray  1 spray Mouth/Throat PRN Hooten, Laurice Record, MD      . metoCLOPramide (REGLAN) tablet 10 mg  10 mg Oral TID AC & HS Hooten, Laurice Record, MD   10 mg at 02/22/17 0843  . morphine 2 MG/ML injection 2 mg  2 mg Intravenous Q2H PRN Hooten, Laurice Record, MD      . multivitamin-lutein (OCUVITE-LUTEIN) capsule 1 capsule  1 capsule Oral Daily Hooten, Laurice Record, MD   1 capsule at 02/21/17 2229  . omega-3 acid ethyl esters (LOVAZA) capsule 1 g  1 capsule Oral Daily Hooten, Laurice Record, MD   1 g at 02/22/17 0843  . ondansetron (ZOFRAN) tablet 4 mg  4 mg Oral Q6H PRN Hooten, Laurice Record, MD       Or  . ondansetron (ZOFRAN) injection 4 mg  4 mg Intravenous Q6H PRN Hooten, Laurice Record, MD      . oxybutynin (DITROPAN-XL) 24 hr tablet 10 mg  10 mg Oral Daily Hooten, Laurice Record, MD   10 mg at 02/22/17 0843  . oxyCODONE (Oxy IR/ROXICODONE) immediate release tablet 10 mg  10 mg Oral Q3H PRN Dereck Leep, MD   10 mg at 02/22/17 0843  . oxyCODONE (Oxy IR/ROXICODONE) immediate release tablet 5 mg  5 mg Oral Q3H PRN Hooten, Laurice Record, MD      . pantoprazole (PROTONIX) EC tablet 40 mg  40 mg Oral BID Dereck Leep, MD   40 mg at 02/22/17 0844  . senna-docusate (Senokot-S) tablet 1 tablet  1 tablet Oral BID Dereck Leep, MD   1 tablet at 02/22/17 [phone]  . sodium phosphate (FLEET) 7-19 GM/118ML enema 1 enema  1 enema Rectal Once PRN Hooten, Laurice Record, MD      . traMADol Veatrice Bourbon) tablet 50-100 mg  50-100 mg Oral Q4H PRN Dereck Leep, MD   100 mg at 02/22/17 0010  . vitamin B-12 (CYANOCOBALAMIN) tablet  5,000 mcg  5,000 mcg Oral Daily Hooten, Laurice Record, MD   5,000 mcg at  02/22/17 0844     Discharge Medications: Please see discharge summary for a list of discharge medications.  Relevant Imaging Results:  Relevant Lab Results:   Additional Information (SSN: [id number])  Ryker Pherigo, Veronia Beets, LCSW

## 2017-02-22 NOTE — Discharge Summary (Signed)
Physician Discharge Summary  Patient ID: Sylvia Miller MRN: [id number] DOB/AGE: [birth year] 76 y.o.  Admit date: 02/21/2017 Discharge date: 02/24/2017  Admission Diagnoses:  PRIMARY OSTEOARTHRITIS OF RIGHT KNEE   Discharge Diagnoses: Patient Active Problem List   Diagnosis Date Noted  . S/P total knee arthroplasty 02/21/2017  . MDS (myelodysplastic syndrome) ([place]) 05/12/2016  . B12 deficiency 05/04/2016  . Essential hypertension 05/04/2016  . Mixed stress and urge urinary incontinence 05/04/2016  . Rheumatoid arthritis involving multiple sites with positive rheumatoid factor ([place]) 05/04/2016    Past Medical History:  Diagnosis Date  . Allergy   . Arthritis    rhematoid /knees/hands/shoulders (Right shoulder)   . Bladder incontinence   . Dyspnea   . GERD (gastroesophageal reflux disease)   . Heart murmur   . Hypertension    controlled with meds;   Marland Kitchen MDS (myelodysplastic syndrome) ([place]) 2000  . Ulcer      Transfusion: No transfusions during this admission   Consultants (if any):   Discharged Condition: Improved  Hospital Course: Sylvia Miller is an 76 y.o. female who was admitted 02/21/2017 with a diagnosis of degenerative arthrosis right knee and went to the operating room on 02/21/2017 and underwent the above named procedures.    Surgeries:Procedure(s): COMPUTER ASSISTED TOTAL KNEE ARTHROPLASTY on 02/21/2017  PRE-OPERATIVE DIAGNOSIS: Degenerative arthrosis of the right knee, primary  POST-OPERATIVE DIAGNOSIS:  Same  PROCEDURE:  Right total knee arthroplasty using computer-assisted navigation  SURGEON:  Marciano Sequin. M.D.  ASSISTANT:  Vance Peper, PA (present and scrubbed throughout the case, critical for assistance with exposure, retraction, instrumentation, and closure)  ANESTHESIA: spinal  ESTIMATED BLOOD LOSS: 50 mL  FLUIDS REPLACED: 1300 mL of crystalloid  TOURNIQUET TIME: 111 minutes  DRAINS: 2 medium Hemovac drains  SOFT TISSUE  RELEASES: Anterior cruciate ligament, posterior cruciate ligament, deep medial collateral ligament, patellofemoral ligament, posterolateral corner  IMPLANTS UTILIZED: DePuy Attune size 3 posterior stabilized femoral component (cemented), size 3 rotating platform tibial component (cemented), 32 mm medialized dome patella (cemented), and a 10 mm stabilized rotating platform polyethylene insert.  INDICATIONS FOR SURGERY: Sylvia Miller is a 76 y.o. year old female with a long history of progressive knee pain. X-rays demonstrated severe degenerative changes in tricompartmental fashion. The patient had not seen any significant improvement despite conservative nonsurgical intervention. After discussion of the risks and benefits of surgical intervention, the patient expressed understanding of the risks benefits and agree with plans for total knee arthroplasty.   The risks, benefits, and alternatives were discussed at length including but not limited to the risks of infection, bleeding, nerve injury, stiffness, blood clots, the need for revision surgery, cardiopulmonary complications, among others, and they were willing to proceed.   Patient tolerated the surgery well. No complications .Patient was taken to PACU where she was stabilized and then transferred to the orthopedic floor.  Patient started on Lovenox 30 mg q 12 hrs. Foot pumps applied bilaterally at 80 mm hgb. Heels elevated off bed with rolled towels. No evidence of DVT. Calves non tender. Negative Homan. Physical therapy started on day #1 for gait training and transfer with OT starting on  day #1 for ADL and assisted devices. Patient has done well with therapy. Ambulated less than 60 feet upon being discharged.  Patient's IV And Foley were discontinued on day #1 with Hemovac being discontinued on day #2. Dressing was changed on day 2 prior to patient being discharged   She was given perioperative antibiotics:  Anti-infectives (From admission,  onward)   Start     Dose/Rate Route Frequency Ordered Stop   02/21/17 2200  ceFAZolin (ANCEF) 2 g in dextrose 5 % 100 mL IVPB     2 g 200 mL/hr over 30 Minutes Intravenous Every 6 hours 02/21/17 2140 02/22/17 2159   02/21/17 2130  ceFAZolin (ANCEF) IVPB 2g/100 mL premix  Status:  Discontinued     2 g 200 mL/hr over 30 Minutes Intravenous Every 6 hours 02/21/17 2124 02/21/17 2139   02/21/17 1424  ceFAZolin (ANCEF) 2-4 GM/100ML-% IVPB    Comments:  Lyman Bishop   : cabinet override      02/21/17 1424 02/22/17 0229   02/21/17 0600  ceFAZolin (ANCEF) IVPB 2g/100 mL premix  Status:  Discontinued     2 g 200 mL/hr over 30 Minutes Intravenous On call to O.R. 02/20/17 2205 02/21/17 1327    .  She was fitted with AV 1 compression foot pump devices, instructed on heel pumps, early ambulation, and fitted with TED stockings bilaterally for DVT prophylaxis.  She benefited maximally from the hospital stay and there were no complications.    Recent vital signs:  Vitals:   02/22/17 0020 02/22/17 0443  BP: (!) 148/46 (!) 118/49  Pulse: 65 62  Resp:    Temp: 98 F (36.7 C) 97.9 F (36.6 C)  SpO2: 100% 95%    Recent laboratory studies:  Lab Results  Component Value Date   HGB 10.8 (L) 02/22/2017   HGB 12.1 02/08/2017   Lab Results  Component Value Date   WBC 4.5 02/22/2017   PLT 123 (L) 02/22/2017   Lab Results  Component Value Date   INR 1.05 02/08/2017   Lab Results  Component Value Date   NA 133 (L) 02/22/2017   K 3.7 02/22/2017   CL 103 02/22/2017   CO2 24 02/22/2017   BUN 15 02/22/2017   CREATININE 0.70 02/22/2017   GLUCOSE 115 (H) 02/22/2017    Discharge Medications:   Allergies as of 02/23/2017   No Known Allergies     Medication List    STOP taking these medications   aspirin EC 81 MG tablet   meloxicam 7.5 MG tablet Commonly known as:  MOBIC     TAKE these medications   acetaminophen 325 MG tablet Commonly known as:  TYLENOL Take 325-650 mg by  mouth every 6 (six) hours as needed for moderate pain or headache.   atorvastatin 10 MG tablet Commonly known as:  LIPITOR Take 5 mg by mouth daily.   BIOFREEZE EX Apply 1 application topically as needed (for knee/leg pain).   CALCIUM MAGNESIUM PO Take 1 tablet by mouth daily.   diclofenac sodium 1 % Gel Commonly known as:  VOLTAREN Apply topically 4 (four) times daily as needed.   diphenhydrAMINE 25 mg capsule Commonly known as:  BENADRYL Take 25 mg by mouth every 6 (six) hours as needed for allergies.   DRY EYES OP Place 1 drop into both eyes as needed (for dry eyes).   enoxaparin 30 MG/0.3ML injection Commonly known as:  LOVENOX Inject 0.3 mLs (30 mg total) into the skin every 12 (twelve) hours for 14 days. Start taking on:  91/47/8295   folic acid 621 MCG tablet Commonly known as:  FOLVITE Take 800 mcg by mouth daily.   lisinopril 5 MG tablet Commonly known as:  PRINIVIL,ZESTRIL Take 5 mg by mouth daily.   OCUVITE PO Take 1 tablet by mouth daily.   OSTEO BI-FLEX ADV TRIPLE  ST PO Take 1 tablet by mouth daily.   oxybutynin 10 MG 24 hr tablet Commonly known as:  DITROPAN-XL Take 10 mg by mouth daily.   oxyCODONE 5 MG immediate release tablet Commonly known as:  Oxy IR/ROXICODONE Take 1 tablet (5 mg total) by mouth every 3 (three) hours as needed for moderate pain ((score 4 to 6)).   pantoprazole 40 MG tablet Commonly known as:  PROTONIX Take 40 mg by mouth daily.   ranitidine 150 MG tablet Commonly known as:  ZANTAC Take 150 mg by mouth daily as needed for heartburn.   SOMBRA NATURAL PAIN RELIEVING 3-3 % Gel Generic drug:  Menthol-Camphor Apply 1 application topically as needed (for knee/leg pain).   sulfaSALAzine 500 MG tablet Commonly known as:  AZULFIDINE Take 1,000 mg by mouth 2 (two) times daily.   SUPER B COMPLEX PO Take 1 tablet by mouth daily.   traMADol 50 MG tablet Commonly known as:  ULTRAM Take 1-2 tablets (50-100 mg total) by mouth  every 4 (four) hours as needed for moderate pain.   TRIPLE OMEGA-3-6-9 Caps Take 1 capsule by mouth daily.   Vitamin B-12 5000 MCG Tbdp Take 5,000 mcg by mouth daily.   cyanocobalamin 1000 MCG/ML injection Commonly known as:  (VITAMIN B-12) Inject 1,000 mcg into the muscle every 30 (thirty) days.   Vitamin D3 5000 units Caps Take 5,000 Units by mouth daily.            Durable Medical Equipment  (From admission, onward)        Start     Ordered   02/21/17 2124  DME Walker rolling  Once    Question:  Patient needs a walker to treat with the following condition  Answer:  Total knee replacement status   02/21/17 2124   02/21/17 2124  DME Bedside commode  Once    Question:  Patient needs a bedside commode to treat with the following condition  Answer:  Total knee replacement status   02/21/17 2124      Diagnostic Studies: Dg Knee Right Port  Result Date: 02/21/2017 CLINICAL DATA:  Right knee replacement EXAM: PORTABLE RIGHT KNEE - 1-2 VIEW COMPARISON:  None. FINDINGS: The right knee demonstrates a total knee arthroplasty without evidence of hardware failure complication. There is no significant joint effusion. There is no fracture or dislocation. The alignment is anatomic. Surgical drains are present. Post-surgical changes noted in the surrounding soft tissues. IMPRESSION: Interval right total knee arthroplasty. Electronically Signed   By: Kathreen Devoid   On: 02/21/2017 20:26    Disposition: Final discharge disposition not confirmed  Discharge Instructions    Diet - low sodium heart healthy   Complete by:  As directed    Increase activity slowly   Complete by:  As directed       Follow-up Information    Watt Climes, PA On 03/08/2017.   Specialty:  Physician Assistant Why:  at 1:45pm Contact information: [place] [address] [phone]        Dereck Leep, MD On 04/05/2017.   Specialty:  Orthopedic  Surgery Why:  at 11:15am Contact information: [place] [address] [phone]            Signed: Watt Climes 02/22/2017, 7:32 AM

## 2017-02-22 NOTE — Progress Notes (Signed)
Physical Therapy Treatment Patient Details Name: Sylvia Miller MRN: [id number] DOB: 01/09/1941 Today's Date: 02/22/2017    History of Present Illness Pt admitted for R TKR. Pt also has issues with L knee and will need replacement at some point.     PT Comments    Pt presented in bed agreeable to therapy. Pt unable to provide numerical assessment for pain however noted to increase grimaces with initial mobility. Pt performed supine therex as instructed with good technique and required min cues. Pt continues to require min/modA with bed mobility and use of features. Pt did demonstrate improved tolerance to ambulation this session and was able to increase ambulation distance to 61ft with minA and moderate multimodal cues for posture and sequencing. Pt would continue to benefit from skilled PT to improve strength, ROM, and safety to improve functional mobility.    Follow Up Recommendations  SNF     Equipment Recommendations  Rolling walker with 5" wheels    Recommendations for Other Services       Precautions / Restrictions Precautions Precautions: Fall;Knee Precaution Booklet Issued: No Restrictions Weight Bearing Restrictions: Yes RLE Weight Bearing: Weight bearing as tolerated    Mobility  Bed Mobility Overal bed mobility: Needs Assistance Bed Mobility: Supine to Sit;Sit to Supine     Supine to sit: Mod assist Sit to supine: Min assist   General bed mobility comments: Use of features and pt using BUE to lift LE onto bed  Transfers Overall transfer level: Needs assistance Equipment used: Rolling walker (2 wheeled) Transfers: Sit to/from Stand Sit to Stand: Min assist         General transfer comment: cues for hand placement and safe use of RW  Ambulation/Gait Ambulation/Gait assistance: Min assist Ambulation Distance (Feet): 30 Feet Assistive device: Rolling walker (2 wheeled) Gait Pattern/deviations: Step-to pattern;Decreased step length - right;Decreased step  length - left;Decreased stance time - right;Decreased stride length;Trunk flexed     General Gait Details: forward flexed posture, mod verbal cues for advancing LLE to meet RLE    Stairs            Wheelchair Mobility    Modified Rankin (Stroke Patients Only)       Balance Overall balance assessment: Needs assistance Sitting-balance support: Feet supported Sitting balance-Leahy Scale: Good     Standing balance support: Bilateral upper extremity supported Standing balance-Leahy Scale: Fair                              Cognition Arousal/Alertness: Awake/alert Behavior During Therapy: WFL for tasks assessed/performed Overall Cognitive Status: Within Functional Limits for tasks assessed                                        Exercises Other Exercises Other Exercises: ankle pumps, GS, QS, AA SLR, hip abd/add, LAQ x 10-12/to fatigue RLE only     General Comments        Pertinent Vitals/Pain Pain Assessment: Faces Faces Pain Scale: Hurts little more Pain Location: R knee Pain Descriptors / Indicators: Operative site guarding Pain Intervention(s): Limited activity within patient's tolerance;Monitored during session    Home Living Family/patient expects to be discharged to:: Private residence Living Arrangements: Spouse/significant other Available Help at Discharge: Family Type of Home: House Home Access: Level entry   Home Layout: One level Home Equipment: Environmental consultant - 4 wheels;Shower  seat;Grab bars - toilet      Prior Function Level of Independence: Independent with assistive device(s)      Comments: used rollater for all mobility prior to surgery, only able to walk short distances. Independent in all ADLs.   PT Goals (current goals can now be found in the care plan section) Acute Rehab PT Goals Patient Stated Goal: to get stronger PT Goal Formulation: With patient Time For Goal Achievement: 03/08/17 Potential to Achieve Goals:  Good    Frequency    BID      PT Plan      Co-evaluation              AM-PAC PT "6 Clicks" Daily Activity  Outcome Measure  Difficulty turning over in bed (including adjusting bedclothes, sheets and blankets)?: Unable Difficulty moving from lying on back to sitting on the side of the bed? : Unable Difficulty sitting down on and standing up from a chair with arms (e.g., wheelchair, bedside commode, etc,.)?: Unable Help needed moving to and from a bed to chair (including a wheelchair)?: A Lot Help needed walking in hospital room?: A Lot Help needed climbing 3-5 steps with a railing? : A Lot 6 Click Score: 9    End of Session Equipment Utilized During Treatment: Gait belt Activity Tolerance: Patient tolerated treatment well Patient left: in bed;with call bell/phone within reach;with bed alarm set;with family/visitor present   PT Visit Diagnosis: Muscle weakness (generalized) (M62.81);Difficulty in walking, not elsewhere classified (R26.2);Pain Pain - Right/Left: Right Pain - part of body: Knee     Time: 6546-5035 PT Time Calculation (min) (ACUTE ONLY): 42 min  Charges:  $Gait Training: 8-22 mins $Therapeutic Exercise: 8-22 mins $Therapeutic Activity: 8-22 mins                      Sylvia Miller  Sylvia Miller, PTA 02/22/2017, 4:21 PM

## 2017-02-22 NOTE — Evaluation (Signed)
Physical Therapy Evaluation Patient Details Name: Sylvia Miller MRN: [id number] DOB: [birth year] Today's Date: 02/22/2017   History of Present Illness  Pt admitted for R TKR. Pt also has issues with L knee and will need replacement at some point.   Clinical Impression  Pt is a pleasant 76 year old R TKR who was admitted for R TKR. Pt performs bed mobility with mod assist, transfers with min assist, and ambulation with min assist and RW. Pt demonstrates deficits with strength/ROM/pain/endurance. Pt is currently not at baseline level, not safe to dc home at this time. Pt demonstrates ability to perform 10 SLRs with independence, therefore does not require KI for mobility. Would benefit from skilled PT to address above deficits and promote optimal return to PLOF; recommend transition to STR upon discharge from acute hospitalization.       Follow Up Recommendations SNF    Equipment Recommendations  Rolling walker with 5" wheels    Recommendations for Other Services       Precautions / Restrictions Precautions Precautions: Fall;Knee Precaution Booklet Issued: No Restrictions Weight Bearing Restrictions: Yes RLE Weight Bearing: Weight bearing as tolerated      Mobility  Bed Mobility Overal bed mobility: Needs Assistance Bed Mobility: Supine to Sit     Supine to sit: Mod assist     General bed mobility comments: needs assist for initiation and sequencing. Once seated, needs B UE support on bed to maintain balance due to dizziness, however resolves quickly.  Transfers Overall transfer level: Needs assistance Equipment used: Rolling walker (2 wheeled) Transfers: Sit to/from Stand Sit to Stand: Min assist         General transfer comment: cues to push from seated surface. Once standing, unable to fully stand upright and has forward flexed posture.   Ambulation/Gait Ambulation/Gait assistance: Min assist Ambulation Distance (Feet): 5 Feet Assistive device: Rolling walker  (2 wheeled) Gait Pattern/deviations: Step-to pattern     General Gait Details: very slow ambulation to recliner due to L knee instability and R knee pain. Heavy WBing on RW. Fatigues quickly. Follows commands well  Financial trader Rankin (Stroke Patients Only)       Balance Overall balance assessment: Needs assistance Sitting-balance support: Feet supported Sitting balance-Leahy Scale: Good     Standing balance support: Bilateral upper extremity supported Standing balance-Leahy Scale: Fair                               Pertinent Vitals/Pain Pain Assessment: Faces Faces Pain Scale: Hurts little more Pain Location: R knee Pain Descriptors / Indicators: Operative site guarding Pain Intervention(s): Limited activity within patient's tolerance;Ice applied    Home Living Family/patient expects to be discharged to:: Private residence Living Arrangements: Spouse/significant other Available Help at Discharge: Family Type of Home: House Home Access: Level entry     Home Layout: One level Home Equipment: Environmental consultant - 4 wheels      Prior Function Level of Independence: Independent with assistive device(s)         Comments: used rollater for all mobility prior to surgery, only able to walk short distances.     Hand Dominance        Extremity/Trunk Assessment   Upper Extremity Assessment Upper Extremity Assessment: Generalized weakness(B UE grossly 3+/5)    Lower Extremity Assessment Lower Extremity Assessment: Generalized weakness(L LE grossly 3+/5; R  LE grossly 3/5)       Communication   Communication: No difficulties  Cognition Arousal/Alertness: Awake/alert Behavior During Therapy: WFL for tasks assessed/performed Overall Cognitive Status: Within Functional Limits for tasks assessed                                        General Comments      Exercises Total Joint  Exercises Goniometric ROM: R knee AAROM: 0-65 degrees Other Exercises Other Exercises: supine ther-ex performed including R LE ankle pumps, quad sets, SLRs, hip abd/add, and seated knee flexion stretches. All ther-ex performed with cga and safe technique   Assessment/Plan    PT Assessment Patient needs continued PT services  PT Problem List Decreased strength;Decreased activity tolerance;Decreased balance;Decreased mobility;Pain       PT Treatment Interventions DME instruction;Gait training;Therapeutic exercise    PT Goals (Current goals can be found in the Care Plan section)  Acute Rehab PT Goals Patient Stated Goal: to get stronger PT Goal Formulation: With patient Time For Goal Achievement: 03/08/17 Potential to Achieve Goals: Good    Frequency BID   Barriers to discharge        Co-evaluation               AM-PAC PT "6 Clicks" Daily Activity  Outcome Measure Difficulty turning over in bed (including adjusting bedclothes, sheets and blankets)?: Unable Difficulty moving from lying on back to sitting on the side of the bed? : Unable Difficulty sitting down on and standing up from a chair with arms (e.g., wheelchair, bedside commode, etc,.)?: Unable Help needed moving to and from a bed to chair (including a wheelchair)?: A Lot Help needed walking in hospital room?: A Lot Help needed climbing 3-5 steps with a railing? : A Lot 6 Click Score: 9    End of Session Equipment Utilized During Treatment: Gait belt Activity Tolerance: Patient tolerated treatment well Patient left: in chair;with chair alarm set;with SCD's reapplied Nurse Communication: Mobility status;Weight bearing status PT Visit Diagnosis: Muscle weakness (generalized) (M62.81);Difficulty in walking, not elsewhere classified (R26.2);Pain Pain - Right/Left: Right Pain - part of body: Knee    Time: 5638-7564 PT Time Calculation (min) (ACUTE ONLY): 27 min   Charges:   PT Evaluation $PT Eval Low  Complexity: 1 Low PT Treatments $Therapeutic Exercise: 8-22 mins   PT G Codes:   PT G-Codes **NOT FOR INPATIENT CLASS** Functional Assessment Tool Used: AM-PAC 6 Clicks Basic Mobility Functional Limitation: Mobility: Walking and moving around Mobility: Walking and Moving Around Current Status ([id number]): At least 60 percent but less than 80 percent impaired, limited or restricted Mobility: Walking and Moving Around Goal Status [phone]): At least 40 percent but less than 60 percent impaired, limited or restricted    Greggory Stallion, PT, DPT [phone]   Shaeley Segall 02/22/2017, 10:52 AM

## 2017-02-22 NOTE — Progress Notes (Signed)
Clinical Education officer, museum (CSW) received a call from patient's husband [name] stating that their SNF choice is [facility]. Plan is for patient to D/C to [facility] Saturday 02/24/17 pending medical clearance. [facility] admissions coordinator at [facility] is aware of above. CSW will continue to follow and assist as needed.   [employer], LCSW [phone]

## 2017-02-22 NOTE — Anesthesia Postprocedure Evaluation (Signed)
Anesthesia Post Note  Patient: Sylvia Miller  Procedure(s) Performed: COMPUTER ASSISTED TOTAL KNEE ARTHROPLASTY (Right Knee)  Patient location during evaluation: Nursing Unit Anesthesia Type: Spinal Level of consciousness: awake, awake and alert and oriented Pain management: pain level controlled Vital Signs Assessment: post-procedure vital signs reviewed and stable Respiratory status: spontaneous breathing Cardiovascular status: blood pressure returned to baseline Postop Assessment: no headache, no backache, no apparent nausea or vomiting and adequate PO intake Anesthetic complications: no     Last Vitals:  Vitals:   02/22/17 0020 02/22/17 0443  BP: (!) 148/46 (!) 118/49  Pulse: 65 62  Resp:    Temp: 36.7 C 36.6 C  SpO2: 100% 95%    Last Pain:  Vitals:   02/22/17 0443  TempSrc: Oral  PainSc:                  Hedda Slade

## 2017-02-23 LAB — CBC
HCT: 30.4 % — ABNORMAL LOW (ref 35.0–47.0)
Hemoglobin: 10.2 g/dL — ABNORMAL LOW (ref 12.0–16.0)
MCH: 34 pg (ref 26.0–34.0)
MCHC: 33.4 g/dL (ref 32.0–36.0)
MCV: 101.8 fL — ABNORMAL HIGH (ref 80.0–100.0)
PLATELETS: 117 10*3/uL — AB (ref 150–440)
RBC: 2.99 MIL/uL — AB (ref 3.80–5.20)
RDW: 13.7 % (ref 11.5–14.5)
WBC: 4.5 10*3/uL (ref 3.6–11.0)

## 2017-02-23 LAB — BASIC METABOLIC PANEL
ANION GAP: 6 (ref 5–15)
BUN: 15 mg/dL (ref 6–20)
CALCIUM: 8.7 mg/dL — AB (ref 8.9–10.3)
CO2: 24 mmol/L (ref 22–32)
Chloride: 109 mmol/L (ref 101–111)
Creatinine, Ser: 0.69 mg/dL (ref 0.44–1.00)
GFR calc Af Amer: >60 mL/min (ref >60)
Glucose, Bld: 91 mg/dL (ref 65–99)
POTASSIUM: 3.5 mmol/L (ref 3.5–5.1)
SODIUM: 139 mmol/L (ref 135–145)

## 2017-02-23 NOTE — Progress Notes (Signed)
Physical Therapy Treatment Patient Details Name: Sylvia Miller MRN: [id number] DOB: 1940/06/03 Today's Date: 02/23/2017    History of Present Illness Pt admitted for R TKR. Pt also has issues with L knee and will need replacement at some point.     PT Comments    Pt presented in bed indicating recently returned to bed. Pt agreeable to supine therex. Pt able to perform all supine LE therex as noted and able to progress from AASLR to SLR with repetitions. Pt also able to demonstrate decreased guarding with supine AA heel slides for improved ROM. Pt remained in bed at end of session to complete lunch which was received late and left with bed alarm on, phone within reach, and husband present.   Follow Up Recommendations  SNF     Equipment Recommendations  Rolling walker with 5" wheels    Recommendations for Other Services       Precautions / Restrictions Precautions Precautions: Fall;Knee Precaution Booklet Issued: No Restrictions Weight Bearing Restrictions: Yes RLE Weight Bearing: Weight bearing as tolerated    Mobility  Bed Mobility                  Transfers                    Ambulation/Gait                 Stairs            Wheelchair Mobility    Modified Rankin (Stroke Patients Only)       Balance                                            Cognition Arousal/Alertness: Awake/alert Behavior During Therapy: WFL for tasks assessed/performed Overall Cognitive Status: Within Functional Limits for tasks assessed                                        Exercises Total Joint Exercises Ankle Circles/Pumps: AROM;Strengthening;Both;15 reps;Supine Quad Sets: Strengthening;Both;15 reps;Supine Gluteal Sets: Strengthening;Both;15 reps;Supine Heel Slides: AAROM;Strengthening;Right;10 reps Hip ABduction/ADduction: AROM;Strengthening;Right;10 reps Straight Leg Raises: AAROM;Right;10 reps;Supine Knee  Flexion: AAROM;Right;5 reps;Supine    General Comments        Pertinent Vitals/Pain Pain Assessment: 0-10 Pain Score: 5  Pain Location: R knee Pain Descriptors / Indicators: Operative site guarding Pain Intervention(s): Limited activity within patient's tolerance;Monitored during session    Home Living                      Prior Function            PT Goals (current goals can now be found in the care plan section) Acute Rehab PT Goals Patient Stated Goal: to get stronger PT Goal Formulation: With patient Time For Goal Achievement: 03/08/17 Potential to Achieve Goals: Good    Frequency    BID      PT Plan Current plan remains appropriate    Co-evaluation              AM-PAC PT "6 Clicks" Daily Activity  Outcome Measure  Difficulty turning over in bed (including adjusting bedclothes, sheets and blankets)?: A Little Difficulty moving from lying on back to sitting on the side of the bed? : A Little Difficulty  sitting down on and standing up from a chair with arms (e.g., wheelchair, bedside commode, etc,.)?: A Little Help needed moving to and from a bed to chair (including a wheelchair)?: A Little Help needed walking in hospital room?: A Little Help needed climbing 3-5 steps with a railing? : A Lot 6 Click Score: 17    End of Session   Activity Tolerance: Patient tolerated treatment well Patient left: in bed;with call bell/phone within reach;with bed alarm set;with family/visitor present   PT Visit Diagnosis: Muscle weakness (generalized) (M62.81);Difficulty in walking, not elsewhere classified (R26.2);Pain Pain - Right/Left: Right Pain - part of body: Knee     Time: 1332-1350 PT Time Calculation (min) (ACUTE ONLY): 18 min  Charges:  $Therapeutic Exercise: 8-22 mins             Cordero Surette  Dominie Benedick, PTA 02/23/2017, 1:56 PM

## 2017-02-23 NOTE — Progress Notes (Addendum)
Plan is for patient to D/C to [facility] tomorrow pending medical clearance. Per [facility] admissions coordinator at [facility] patient can come to room 207-B. Clinical Education officer, museum (CSW) sent D/C summary to [facility] today via [place]. Patient and her husband are aware of above.   [employer], LCSW [phone]

## 2017-02-23 NOTE — Progress Notes (Signed)
   Subjective: 2 Days Post-Op Procedure(s) (LRB): COMPUTER ASSISTED TOTAL KNEE ARTHROPLASTY (Right) Patient reports pain as mild.   Patient is well, and has had no acute complaints or problems Patient did fair with physical therapy yesterday. Plan is to go Rehab after hospital stay. no nausea and no vomiting Patient denies any chest pains or shortness of breath. Objective: Vital signs in last 24 hours: Temp:  [98 F (36.7 C)-98.3 F (36.8 C)] 98.2 F (36.8 C) (12/21 0432) Pulse Rate:  [60-92] 92 (12/21 0432) Resp:  [16-18] 16 (12/21 0432) BP: (123-145)/(40-71) 145/58 (12/21 0432) SpO2:  [96 %-98 %] 98 % (12/21 0432) well approximated incision Heels are non tender and elevated off the bed using rolled towels Intake/Output from previous day: 12/20 0701 - 12/21 0700 In: 600 [P.O.:600] Out: 100 [Drains:100] Intake/Output this shift: Total I/O In: 600 [P.O.:600] Out: 100 [Drains:100]  Recent Labs    02/22/17 0458  HGB 10.8*   Recent Labs    02/22/17 0458  WBC 4.5  RBC 3.09*  HCT 31.8*  PLT 123*   Recent Labs    02/22/17 0458  NA 133*  K 3.7  CL 103  CO2 24  BUN 15  CREATININE 0.70  GLUCOSE 115*  CALCIUM 8.7*   No results for input(s): LABPT, INR in the last 72 hours.  EXAM General - Patient is Alert, Appropriate and Oriented Extremity - Neurologically intact Neurovascular intact Sensation intact distally Intact pulses distally Dorsiflexion/Plantar flexion intact No cellulitis present Compartment soft Dressing - scant drainage Motor Function - intact, moving foot and toes well on exam.    Past Medical History:  Diagnosis Date  . Allergy   . Arthritis    rhematoid /knees/hands/shoulders (Right shoulder)   . Bladder incontinence   . Dyspnea   . GERD (gastroesophageal reflux disease)   . Heart murmur   . Hypertension    controlled with meds;   Marland Kitchen MDS (myelodysplastic syndrome) ([place]) 2000  . Ulcer     Assessment/Plan: 2 Days Post-Op  Procedure(s) (LRB): COMPUTER ASSISTED TOTAL KNEE ARTHROPLASTY (Right) Active Problems:   S/P total knee arthroplasty  Estimated body mass index is 32.51 kg/m as calculated from the following:   Height as of this encounter: 4\' 8"  (1.422 m).   Weight as of this encounter: 65.8 kg (145 lb). Up with therapy Plan for discharge tomorrow Discharge to SNF  Labs: None DVT Prophylaxis - Lovenox, Foot Pumps and TED hose Weight-Bearing as tolerated to right leg Hemovac discontinued on today's visit.  Tips of the Hemovac were visually examined and appeared to be intact. The patient needs a bowel movement. Please wash the operative leg and apply TED stockings to both legs. Please change dressing  Ashlie Mcmenamy R. [place] 02/23/2017, 6:50 AM

## 2017-02-23 NOTE — Progress Notes (Signed)
Physical Therapy Treatment Patient Details Name: Sylvia Miller MRN: [id number] DOB: [birth year] Today's Date: 02/23/2017    History of Present Illness Pt admitted for R TKR. Pt also has issues with L knee and will need replacement at some point.     PT Comments    Pt demonstrated improved tolerance to all activities this am. Pt was able to increase ambulation distance and perform transfers and bed mobility with decreased assistance as noted below. She would continue to benefit from skilled PT to improve strength, ROM and overall functional mobility tolerance.    Follow Up Recommendations  SNF     Equipment Recommendations  Rolling walker with 5" wheels    Recommendations for Other Services       Precautions / Restrictions Precautions Precautions: Fall;Knee Precaution Booklet Issued: No Restrictions Weight Bearing Restrictions: Yes RLE Weight Bearing: Weight bearing as tolerated    Mobility  Bed Mobility Overal bed mobility: Needs Assistance Bed Mobility: Supine to Sit     Supine to sit: Min assist     General bed mobility comments: use of features and increased time, min A for scooting to EOB  Transfers Overall transfer level: Needs assistance Equipment used: Rolling walker (2 wheeled) Transfers: Sit to/from Stand Sit to Stand: Min guard         General transfer comment: cues for hand placement   Ambulation/Gait Ambulation/Gait assistance: Min guard Ambulation Distance (Feet): 40 Feet Assistive device: Rolling walker (2 wheeled) Gait Pattern/deviations: Step-to pattern;Decreased step length - right;Decreased step length - left;Decreased stance time - left;Decreased stride length     General Gait Details: decreased gait speed, cues to advnace LLE to meet RLE    Stairs            Wheelchair Mobility    Modified Rankin (Stroke Patients Only)       Balance Overall balance assessment: Needs assistance Sitting-balance support: Feet  supported Sitting balance-Leahy Scale: Good     Standing balance support: Bilateral upper extremity supported Standing balance-Leahy Scale: Fair Standing balance comment: able to stand with UE support to adjust gown with no LOB                            Cognition Arousal/Alertness: Awake/alert Behavior During Therapy: WFL for tasks assessed/performed Overall Cognitive Status: Within Functional Limits for tasks assessed                                        Exercises Total Joint Exercises Ankle Circles/Pumps: AROM;Both;15 reps;Supine Quad Sets: AROM;Strengthening;Both;15 reps;Supine Heel Slides: AAROM;Right;10 reps;Supine Hip ABduction/ADduction: AROM;Strengthening;Right;15 reps;Supine Straight Leg Raises: AAROM;Strengthening;Right;10 reps;Supine Long Arc Quad: AROM;Strengthening;Right;10 reps;Seated Knee Flexion: AAROM;Right;5 reps Goniometric ROM: 3-85    General Comments        Pertinent Vitals/Pain Pain Assessment: 0-10 Pain Score: 6  Pain Location: R knee Pain Descriptors / Indicators: Operative site guarding Pain Intervention(s): Limited activity within patient's tolerance;Monitored during session    Home Living                      Prior Function            PT Goals (current goals can now be found in the care plan section) Acute Rehab PT Goals Patient Stated Goal: to get stronger PT Goal Formulation: With patient Time For Goal Achievement: 03/08/17 Potential to Achieve  Goals: Good    Frequency    BID      PT Plan Current plan remains appropriate    Co-evaluation              AM-PAC PT "6 Clicks" Daily Activity  Outcome Measure  Difficulty turning over in bed (including adjusting bedclothes, sheets and blankets)?: A Little Difficulty moving from lying on back to sitting on the side of the bed? : A Little Difficulty sitting down on and standing up from a chair with arms (e.g., wheelchair, bedside  commode, etc,.)?: A Little Help needed moving to and from a bed to chair (including a wheelchair)?: A Little Help needed walking in hospital room?: A Little Help needed climbing 3-5 steps with a railing? : A Lot 6 Click Score: 17    End of Session Equipment Utilized During Treatment: Gait belt Activity Tolerance: Patient tolerated treatment well Patient left: in chair;with call bell/phone within reach   PT Visit Diagnosis: Muscle weakness (generalized) (M62.81);Difficulty in walking, not elsewhere classified (R26.2);Pain Pain - Right/Left: Right Pain - part of body: Knee     Time: 2446-2863 PT Time Calculation (min) (ACUTE ONLY): 45 min  Charges:  $Gait Training: 8-22 mins $Therapeutic Exercise: 8-22 mins $Therapeutic Activity: 8-22 mins                      Dashiell Franchino  Marche Hottenstein, PTA 02/23/2017, 9:55 AM

## 2017-02-23 NOTE — Progress Notes (Signed)
Notified Dr. Marry Guan that patient choked on her hamburger for dinner last night. Patient and family states that patient often chokes on food but not liquids. Order received for bedside swallowing study.

## 2017-02-23 NOTE — Progress Notes (Signed)
OT Cancellation Note  Patient Details Name: Sylvia Miller MRN: [id number] DOB: September 09, 1940   Cancelled Treatment:    Reason Eval/Treat Not Completed: Patient declined, no reason specified. Upon attempt, pt and spouse in room. Decline OT session this evening. Pt ready to eat dinner. Will re-attempt next date as pt is available and agreeable.  Jeni Salles, MPH, MS, OTR/L ascom [phone] 02/23/17, 5:20 PM

## 2017-02-24 MED ORDER — PNEUMOCOCCAL VAC POLYVALENT 25 MCG/0.5ML IJ INJ: 0.5000 mL | INJECTION | INTRAMUSCULAR | Status: DC

## 2017-02-24 MED ORDER — ENOXAPARIN SODIUM 40 MG/0.4ML ~~LOC~~ SOLN: 40.0000 mg | SUBCUTANEOUS | 0 refills | Status: DC

## 2017-02-24 NOTE — Progress Notes (Signed)
Physical Therapy Treatment Patient Details Name: Sylvia Miller MRN: [id number] DOB: Jul 21, 1940 Today's Date: 02/24/2017    History of Present Illness Pt admitted for R TKR. Pt also has issues with L knee and will need replacement at some point.     PT Comments    Pt is making good progress towards goals with improved tolerance for ambulation, although does fatigue and need seated rest break. Good tolerance for there-ex and improved AAROM. Pt motivated to perform therapy. Will continue to progress. Safe to dc to SNF this date.    Follow Up Recommendations  SNF     Equipment Recommendations  (youth RW)    Recommendations for Other Services       Precautions / Restrictions Precautions Precautions: Fall;Knee Precaution Booklet Issued: No Restrictions Weight Bearing Restrictions: Yes RLE Weight Bearing: Weight bearing as tolerated    Mobility  Bed Mobility               General bed mobility comments: not performed as pt received in chair  Transfers Overall transfer level: Needs assistance Equipment used: Rolling walker (2 wheeled) Transfers: Sit to/from Stand Sit to Stand: Min guard         General transfer comment: No cues needed for hand placement. Forward flexed posture noted, however safe technique noted  Ambulation/Gait Ambulation/Gait assistance: Min guard Ambulation Distance (Feet): 80 Feet Assistive device: Rolling walker (2 wheeled) Gait Pattern/deviations: Step-to pattern;Decreased step length - right;Decreased step length - left;Decreased stance time - left;Decreased stride length     General Gait Details: slow speed, 1 seated rest break needed during ambulation secondary to pain/fatigue. Improved sequencing.   Stairs            Wheelchair Mobility    Modified Rankin (Stroke Patients Only)       Balance                                            Cognition Arousal/Alertness: Awake/alert Behavior During Therapy:  WFL for tasks assessed/performed Overall Cognitive Status: Within Functional Limits for tasks assessed                                        Exercises Total Joint Exercises Goniometric ROM: R knee AAROM: 2-87 degrees Other Exercises Other Exercises: seated ther-ex performed on R knee including ankle pumps, quad sets, SAQ, SLRs, hip abd/add, LAQ, and seated knee flexion stretches. All ther-ex performed x 15 reps with min assist.    General Comments        Pertinent Vitals/Pain Pain Assessment: 0-10 Pain Score: 6  Pain Location: R knee Pain Descriptors / Indicators: Operative site guarding Pain Intervention(s): Limited activity within patient's tolerance;Ice applied    Home Living                      Prior Function            PT Goals (current goals can now be found in the care plan section) Acute Rehab PT Goals Patient Stated Goal: to get stronger PT Goal Formulation: With patient Time For Goal Achievement: 03/08/17 Potential to Achieve Goals: Good Progress towards PT goals: Progressing toward goals    Frequency    BID      PT Plan Current plan remains  appropriate    Co-evaluation              AM-PAC PT "6 Clicks" Daily Activity  Outcome Measure  Difficulty turning over in bed (including adjusting bedclothes, sheets and blankets)?: A Little Difficulty moving from lying on back to sitting on the side of the bed? : A Little Difficulty sitting down on and standing up from a chair with arms (e.g., wheelchair, bedside commode, etc,.)?: A Little Help needed moving to and from a bed to chair (including a wheelchair)?: A Little Help needed walking in hospital room?: A Little Help needed climbing 3-5 steps with a railing? : A Lot 6 Click Score: 17    End of Session Equipment Utilized During Treatment: Gait belt Activity Tolerance: Patient tolerated treatment well Patient left: in chair;with chair alarm set(bone foam applied) Nurse  Communication: Mobility status;Weight bearing status PT Visit Diagnosis: Muscle weakness (generalized) (M62.81);Difficulty in walking, not elsewhere classified (R26.2);Pain Pain - Right/Left: Right Pain - part of body: Knee     Time: 4680-3212 PT Time Calculation (min) (ACUTE ONLY): 33 min  Charges:  $Gait Training: 8-22 mins $Therapeutic Exercise: 8-22 mins                    G Codes:  Functional Assessment Tool Used: AM-PAC 6 Clicks Basic Mobility Functional Limitation: Mobility: Walking and moving around Mobility: Walking and Moving Around Current Status (Y4825): At least 40 percent but less than 60 percent impaired, limited or restricted Mobility: Walking and Moving Around Goal Status [phone]): At least 20 percent but less than 40 percent impaired, limited or restricted    Greggory Stallion, PT, DPT [phone]    Dwaine Pringle 02/24/2017, 10:36 AM

## 2017-02-24 NOTE — Clinical Social Work Note (Signed)
The patient will discharge today via non-emergent EMS. The patient and facility are aware and in agreement. The facility already has the documentation needed, and the packet has been delivered. CSW is signing off. Please consult should additional needs arise.  Santiago Bumpers, MSW, Latanya Presser [phone]

## 2017-02-24 NOTE — Progress Notes (Signed)
Report called to Melissa @ [facility], EMS called for transportation. 

## 2017-02-24 NOTE — Evaluation (Addendum)
Clinical/Bedside Swallow Evaluation Patient Details  Name: Sylvia Miller MRN: [id number] Date of Birth: Mar 26, 1940  Today's Date: 02/24/2017 Time: SLP Start Time (ACUTE ONLY): 0855 SLP Stop Time (ACUTE ONLY): 0955 SLP Time Calculation (min) (ACUTE ONLY): 60 min  Past Medical History:  Past Medical History:  Diagnosis Date  . Allergy   . Arthritis    rhematoid /knees/hands/shoulders (Right shoulder)   . Bladder incontinence   . Dyspnea   . GERD (gastroesophageal reflux disease)   . Heart murmur   . Hypertension    controlled with meds;   Marland Kitchen MDS (myelodysplastic syndrome) ([place]) 2000  . Ulcer    Past Surgical History:  Past Surgical History:  Procedure Laterality Date  . ABDOMINAL HYSTERECTOMY    . CHOLECYSTECTOMY    . KNEE ARTHROPLASTY Right 02/21/2017   Procedure: COMPUTER ASSISTED TOTAL KNEE ARTHROPLASTY;  Surgeon: Dereck Leep, MD;  Location: [facility] ORS;  Service: Orthopedics;  Laterality: Right;   HPI:  Pt is a 76 y/o female w/ h/o arthritis, GERD, HTN, MDS, ulcers admitted for R TKR. Pt also has issues with L knee and will need replacement at some point. Pt has had much of her medical care in [place] per report. Pt had an episode of getting choked when eating a "hamburger"; denies issues swallowing liquids.    Assessment / Plan / Recommendation Clinical Impression  Pt appears to present w/ adequate oropharyngeal phase swallow function w/ adequate bolus management for transfer/clearing, then swallowing. No overt s/s of aspiration were noted w/ any bolus trial given - pt consumed thin liquids via cup, purees, then softened solids. Pt fed self but needed setup assistance d/t chairbount status. No oral phase deficits noted w/ all trials - min increased oral phase time w/ increased texture for thorough mastication of increased texture. Recommend a mech soft diet w/ thin liquids(cut meats for easier mastication) w/ general aspiration precautions; REFLUX precautions as pt has a baseline of  GERD dx; Pills WHOLE in puree for swallowing. Tray setup at meals. Recommend avoiding foods such as meat and bread together IF problematic and f/u w/ GI for Esophageal phase motility assessment. No further skilled ST Services indicated at this time; NSG to reconsult if any changes in status while admitted. MD/NSG updated. SLP Visit Diagnosis: Dysphagia, pharyngoesophageal phase (R13.14)    Aspiration Risk  (reduced following Reflux precautions)    Diet Recommendation  Mech soft diet for easier mastication and GERD baseline; Thin liquids. General aspiration and REFLUX precautions.   Medication Administration: Whole meds with puree(for easier swallowing overall)    Other  Recommendations Recommended Consults: Consider GI evaluation;Consider esophageal assessment(Dietician f/u) Oral Care Recommendations: Oral care BID;Patient independent with oral care Other Recommendations: (n/a)   Follow up Recommendations None      Frequency and Duration    (n/a)       Prognosis Prognosis for Safe Diet Advancement: Good Barriers to Reach Goals: (GERD )      Swallow Study   General Date of Onset: 02/21/17 HPI: Pt is a 76 y/o female w/ h/o arthritis, GERD, HTN, MDS, ulcers admitted for R TKR. Pt also has issues with L knee and will need replacement at some point. Pt has had much of her medical care in [place] per report. Pt had an episode of getting choked when eating a "hamburger"; denies issues swallowing liquids.  Type of Study: Bedside Swallow Evaluation Previous Swallow Assessment: none Diet Prior to this Study: Regular;Thin liquids Temperature Spikes Noted: No(wbc 4.5) Respiratory Status: Room  air History of Recent Intubation: No Behavior/Cognition: Alert;Cooperative;Pleasant mood(needed repetition of information) Oral Cavity Assessment: Within Functional Limits Oral Care Completed by SLP: Recent completion by staff Oral Cavity - Dentition: Adequate natural dentition Vision: Functional for  self-feeding Self-Feeding Abilities: Able to feed self;Needs set up Patient Positioning: Upright in chair Baseline Vocal Quality: Normal Volitional Cough: Strong Volitional Swallow: Able to elicit    Oral/Motor/Sensory Function Overall Oral Motor/Sensory Function: Within functional limits   Ice Chips Ice chips: Not tested   Thin Liquid Thin Liquid: Within functional limits Presentation: Cup;Self Fed(~3 ozs)    Nectar Thick Nectar Thick Liquid: Not tested   Honey Thick Honey Thick Liquid: Not tested   Puree Puree: Within functional limits Presentation: Self Fed;Spoon(7 trials)   Solid   GO   Solid: Impaired Presentation: Self Fed(4 trials) Oral Phase Impairments: (min increased oral phase time w/ increased texture) Oral Phase Functional Implications: (thorough mastication effort/time) Pharyngeal Phase Impairments: (none) Other Comments: baseline GERD    Functional Assessment Tool Used: clinical judgement Functional Limitations: Swallowing Swallow Current Status (O7078): At least 1 percent but less than 20 percent impaired, limited or restricted Swallow Goal Status [phone]): At least 1 percent but less than 20 percent impaired, limited or restricted Swallow Discharge Status [phone]): At least 1 percent but less than 20 percent impaired, limited or restricted    Orinda Kenner, MS, CCC-SLP Myrth Dahan 02/24/2017,10:41 AM

## 2017-02-24 NOTE — Progress Notes (Signed)
EMS here to transport pt. 

## 2017-03-01 ENCOUNTER — Other Ambulatory Visit: Payer: Self-pay

## 2017-03-01 MED ORDER — OXYCODONE HCL 5 MG PO TABS: 5.0000 mg | ORAL_TABLET | ORAL | 0 refills | Status: DC | PRN

## 2017-03-01 NOTE — Telephone Encounter (Signed)
Rx sent to [facility] phone : [phone] , fax : [phone]  

## 2017-03-06 ENCOUNTER — Encounter
Admission: RE | Admit: 2017-03-06 | Discharge: 2017-03-06 | Disposition: A | Discharge status: Home / Self Care | Payer: Medicare Other | Source: Ambulatory Visit | Attending: Internal Medicine | Admitting: Internal Medicine

## 2017-03-07 ENCOUNTER — Encounter: Payer: Self-pay | Admitting: Gerontology

## 2017-03-07 ENCOUNTER — Non-Acute Institutional Stay (SKILLED_NURSING_FACILITY): Payer: Medicare Other | Admitting: Gerontology

## 2017-03-07 DIAGNOSIS — M1711 Unilateral primary osteoarthritis, right knee: Secondary | ICD-10-CM

## 2017-03-07 DIAGNOSIS — Z96651 Presence of right artificial knee joint: Secondary | ICD-10-CM

## 2017-03-07 NOTE — Progress Notes (Signed)
Location:   [facility] [place] [facility] [place] Room Number: 208B Place of Service:  SNF [phone])  Provider: Toni Arthurs, NP-C  PCP: Dion Body, MD Patient Care Team: Dion Body, MD as PCP - General (Family Medicine)  Extended Emergency Contact Information Primary Emergency Contact: Cimino,Shirley Address: [address]          [place], [place] [address] Johnnette Litter of [place] Mobile Phone: [phone] Relation: Daughter  Code Status: FULL Goals of care:  Advanced Directive information Advanced Directives 03/07/2017  Does Patient Have a Medical Advance Directive? No  Would patient like information on creating a medical advance directive? No - Patient declined     No Known Allergies  Chief Complaint  Patient presents with  . Discharge Note    Discharged from SNF    HPI:  77 y.o. female seen today for discharge evaluation. Pt was admitted to the facility for rehab following admission to [facility] for Right TKA. Pt has been participating in PT and OT. Pt reports she is still having a lot of pain. Calves soft, supple. Negative Homan's sign. Incision well approximated. No redness, no warmth, no irritation, minimal edema. Pt reports her appetite is fair. Voiding without difficulty. Having regular BMs. Pt feels she is ready to go home despite the pain. Polar care in place. VSS. No other complaints.       Past Medical History:  Diagnosis Date  . Allergy   . Anemia   . Arthritis    rhematoid /knees/hands/shoulders (Right shoulder)   . Bladder incontinence   . Chicken pox   . Dyspnea   . Gastric ulcer   . GERD (gastroesophageal reflux disease)   . Heart murmur   . Hyperlipidemia, unspecified   . Hypertension    controlled with meds;   Marland Kitchen MDS (myelodysplastic syndrome) ([place]) 2000  . Muscular dystrophy   . Myelodysplastic syndrome ([place])   . Rheumatoid arthritis ([place])   . Ulcer     Past Surgical History:  Procedure Laterality Date  . ABDOMINAL HYSTERECTOMY    .  CHOLECYSTECTOMY    . COLONOSCOPY    . KNEE ARTHROPLASTY Right 02/21/2017   Procedure: COMPUTER ASSISTED TOTAL KNEE ARTHROPLASTY;  Surgeon: Dereck Leep, MD;  Location: [facility] ORS;  Service: Orthopedics;  Laterality: Right;  . TONSILLECTOMY    . UPPER GASTROINTESTINAL ENDOSCOPY    . WISDOM TOOTH EXTRACTION        reports that  has never smoked. she has never used smokeless tobacco. She reports that she drinks alcohol. She reports that she does not use drugs. Social History   Socioeconomic History  . Marital status: Married    Spouse name: Not on file  . Number of children: 2  . Years of education: 56  . Highest education level: Not on file  Social Needs  . Financial resource strain: Not on file  . Food insecurity - worry: Not on file  . Food insecurity - inability: Not on file  . Transportation needs - medical: Not on file  . Transportation needs - non-medical: Not on file  Occupational History  . Not on file  Tobacco Use  . Smoking status: Never Smoker  . Smokeless tobacco: Never Used  Substance and Sexual Activity  . Alcohol use: Yes    Comment: very occasional  . Drug use: No  . Sexual activity: Yes  Other Topics Concern  . Not on file  Social History Narrative  . Not on file   Functional Status Survey:  No Known Allergies  Pertinent  Health Maintenance Due  Topic Date Due  . DEXA SCAN  05/24/2005  . PNA vac Low Risk Adult (1 of 2 - PCV13) 05/24/2005  . INFLUENZA VACCINE  10/04/2016    Medications: Allergies as of 03/07/2017   No Known Allergies     Medication List        Accurate as of 03/07/17 11:50 AM. Always use your most recent med list.          acetaminophen 325 MG tablet Commonly known as:  TYLENOL Take 325-650 mg by mouth every 6 (six) hours as needed for moderate pain or headache.   atorvastatin 10 MG tablet Commonly known as:  LIPITOR Take 5 mg by mouth daily.   BIOFREEZE EX Apply 1 application topically as needed (for knee/leg  pain).   calcium-vitamin D 250-125 MG-UNIT tablet Commonly known as:  OSCAL WITH D Take 1 tablet by mouth daily.   diclofenac sodium 1 % Gel Commonly known as:  VOLTAREN Apply 2 g topically 4 (four) times daily as needed. Apply to painful joint   diphenhydrAMINE 25 mg capsule Commonly known as:  BENADRYL Take 25 mg by mouth every 6 (six) hours as needed for allergies.   enoxaparin 30 MG/0.3ML injection Commonly known as:  LOVENOX Inject 30 mg into the skin every 12 (twelve) hours.   folic acid 643 MCG tablet Commonly known as:  FOLVITE Take 800 mcg by mouth daily.   GENTEAL TEARS 0.1-0.2-0.3 % Soln Place 1 drop into both eyes daily as needed.   lisinopril 5 MG tablet Commonly known as:  PRINIVIL,ZESTRIL Take 5 mg by mouth daily.   OCUVITE PO Take 1 tablet by mouth daily.   OSTEO BI-FLEX TRIPLE STRENGTH Tabs Take 1 tablet by mouth daily.   oxybutynin 10 MG 24 hr tablet Commonly known as:  DITROPAN-XL Take 10 mg by mouth daily.   oxyCODONE 5 MG immediate release tablet Commonly known as:  Oxy IR/ROXICODONE Take 1 tablet (5 mg total) by mouth every 3 (three) hours as needed for moderate pain ((score 4 to 6)).   pantoprazole 40 MG tablet Commonly known as:  PROTONIX Take 40 mg by mouth daily.   ranitidine 150 MG tablet Commonly known as:  ZANTAC Take 150 mg by mouth daily as needed for heartburn.   sulfaSALAzine 500 MG tablet Commonly known as:  AZULFIDINE Take 1,000 mg by mouth 2 (two) times daily.   SUPER B COMPLEX PO Take 1 tablet by mouth daily.   traMADol 50 MG tablet Commonly known as:  ULTRAM Take 1-2 tablets (50-100 mg total) by mouth every 4 (four) hours as needed for moderate pain.   TRIPLE OMEGA-3-6-9 Caps Take 1 capsule by mouth daily.   Vitamin B-12 5000 MCG Tbdp Take 5,000 mcg by mouth daily.   cyanocobalamin 1000 MCG/ML injection Commonly known as:  (VITAMIN B-12) Inject 1,000 mcg into the muscle every 30 (thirty) days.   Vitamin D3  5000 units Caps Take 5,000 Units by mouth daily.       Review of Systems  Constitutional: Negative for activity change, appetite change, chills, diaphoresis and fever.  HENT: Negative for congestion, mouth sores, nosebleeds, postnasal drip, sneezing, sore throat, trouble swallowing and voice change.   Respiratory: Negative for apnea, cough, choking, chest tightness, shortness of breath and wheezing.   Cardiovascular: Negative for chest pain, palpitations and leg swelling.  Gastrointestinal: Negative for abdominal distention, abdominal pain, constipation, diarrhea and nausea.  Genitourinary: Negative for  difficulty urinating, dysuria, frequency and urgency.  Musculoskeletal: Positive for arthralgias (typical arthritis) and gait problem. Negative for back pain and myalgias.  Skin: Positive for wound. Negative for color change, pallor and rash.  Neurological: Negative for dizziness, tremors, syncope, speech difficulty, weakness, numbness and headaches.  Psychiatric/Behavioral: Negative for agitation and behavioral problems.  All other systems reviewed and are negative.   Vitals:   03/07/17 1108  BP: 135/71  Pulse: 82  Resp: 20  Temp: 98 F (36.7 C)  TempSrc: Oral  SpO2: 99%  Weight: 146 lb (66.2 kg)  Height: 4\' 8"  (1.422 m)   Body mass index is 32.73 kg/m. Physical Exam  Constitutional: She is oriented to person, place, and time. Vital signs are normal. She appears well-developed and well-nourished. She is active and cooperative. She does not appear ill. No distress.  HENT:  Head: Normocephalic and atraumatic.  Mouth/Throat: Uvula is midline, oropharynx is clear and moist and mucous membranes are normal. Mucous membranes are not pale, not dry and not cyanotic.  Eyes: Conjunctivae, EOM and lids are normal. Pupils are equal, round, and reactive to light.  Neck: Trachea normal, normal range of motion and full passive range of motion without pain. Neck supple. No JVD present. No  tracheal deviation, no edema and no erythema present. No thyromegaly present.  Cardiovascular: Normal rate, regular rhythm, normal heart sounds, intact distal pulses and normal pulses. Exam reveals no gallop, no distant heart sounds and no friction rub.  No murmur heard. Pulses:      Dorsalis pedis pulses are 2+ on the right side, and 2+ on the left side.  No edema  Pulmonary/Chest: Effort normal and breath sounds normal. No accessory muscle usage. No respiratory distress. She has no decreased breath sounds. She has no wheezes. She has no rhonchi. She has no rales. She exhibits no tenderness.  Abdominal: Soft. Normal appearance and bowel sounds are normal. She exhibits no distension and no ascites. There is no tenderness.  Musculoskeletal: She exhibits no edema.       Right knee: She exhibits decreased range of motion and laceration. Tenderness found.  Expected osteoarthritis, stiffness; Bilateral Calves soft, supple. Negative Homan's Sign. B- pedal pulses equal  Neurological: She is alert and oriented to person, place, and time. She has normal strength. Gait abnormal.  Skin: Skin is warm and dry. Laceration (R TKA) noted. She is not diaphoretic. No cyanosis. No pallor. Nails show no clubbing.  Psychiatric: She has a normal mood and affect. Her speech is normal and behavior is normal. Judgment and thought content normal. Cognition and memory are normal.  Nursing note and vitals reviewed.   Labs reviewed: Basic Metabolic Panel: Recent Labs    02/08/17 1443 02/22/17 0458 02/23/17 0708  NA 137 133* 139  K 3.5 3.7 3.5  CL 103 103 109  CO2 24 24 24   GLUCOSE 80 115* 91  BUN 20 15 15   CREATININE 0.63 0.70 0.69  CALCIUM 9.1 8.7* 8.7*   Liver Function Tests: Recent Labs    02/08/17 1443  AST 25  ALT 16  ALKPHOS 127*  BILITOT 0.7  PROT 6.8  ALBUMIN 4.1   No results for input(s): LIPASE, AMYLASE in the last 8760 hours. No results for input(s): AMMONIA in the last 8760  hours. CBC: Recent Labs    02/08/17 1443 02/22/17 0458 02/23/17 0708  WBC 3.4* 4.5 4.5  HGB 12.1 10.8* 10.2*  HCT 35.9 31.8* 30.4*  MCV 102.3* 102.9* 101.8*  PLT 161 123*  117*   Cardiac Enzymes: No results for input(s): CKTOTAL, CKMB, CKMBINDEX, TROPONINI in the last 8760 hours. BNP: Invalid input(s): POCBNP CBG: No results for input(s): GLUCAP in the last 8760 hours.  Procedures and Imaging Studies During Stay: Dg Knee Right Port  Result Date: 02/21/2017 CLINICAL DATA:  Right knee replacement EXAM: PORTABLE RIGHT KNEE - 1-2 VIEW COMPARISON:  None. FINDINGS: The right knee demonstrates a total knee arthroplasty without evidence of hardware failure complication. There is no significant joint effusion. There is no fracture or dislocation. The alignment is anatomic. Surgical drains are present. Post-surgical changes noted in the surrounding soft tissues. IMPRESSION: Interval right total knee arthroplasty. Electronically Signed   By: Kathreen Devoid   On: 02/21/2017 20:26    Assessment/Plan:   1.  Primary osteoarthritis of right knee 2.  Status post total right knee replacement  Continue working with PT/OT  Continue exercises as taught by PT/OT  Continue polar care  Skin care per protocol  Continue Oxycodone 5 mg po Q 3 hours prn pain  Continue Tylenol 650 mg po Q 6 hrs prn  Follow up with Orthopedist as instructed for continuity of care   Patient is being discharged with the following home health services: OPPT   Patient is being discharged with the following durable medical equipment: RW, BSC through [facility]   Patient has been advised to f/u with their PCP in 1-2 weeks to bring them up to date on their rehab stay.  Social services at facility was responsible for arranging this appointment.  Pt was provided with a 30 day supply of prescriptions for medications and refills must be obtained from their PCP.  For controlled substances, a more limited supply may be  provided adequate until PCP appointment only.  Future labs/tests needed:    Family/ staff Communication:   Total Time:  Documentation:  Face to Face:  Family/Phone:  Vikki Ports, NP-C [facility] [address], [place] [address] Cell Phone (Mon-Fri 8am-5pm):  [phone] On Call:  [phone] & follow prompts after 5pm & weekends Office Phone:  [phone] Office Fax:  [phone]

## 2017-03-09 ENCOUNTER — Ambulatory Visit
Admission: RE | Admit: 2017-03-09 | Discharge: 2017-03-09 | Disposition: A | Discharge status: Home / Self Care | Payer: Medicare Other | Source: Ambulatory Visit | Attending: Neurology | Admitting: Neurology

## 2017-03-09 DIAGNOSIS — I6782 Cerebral ischemia: Secondary | ICD-10-CM | POA: Diagnosis not present

## 2017-03-09 DIAGNOSIS — R413 Other amnesia: Secondary | ICD-10-CM

## 2017-03-09 DIAGNOSIS — G319 Degenerative disease of nervous system, unspecified: Secondary | ICD-10-CM | POA: Diagnosis not present

## 2017-03-25 DIAGNOSIS — M1710 Unilateral primary osteoarthritis, unspecified knee: Secondary | ICD-10-CM | POA: Insufficient documentation

## 2017-04-18 DIAGNOSIS — G5621 Lesion of ulnar nerve, right upper limb: Secondary | ICD-10-CM | POA: Insufficient documentation

## 2017-04-23 ENCOUNTER — Encounter: Payer: Self-pay | Admitting: *Deleted

## 2017-04-24 ENCOUNTER — Ambulatory Visit: Payer: Medicare Other | Admitting: Anesthesiology

## 2017-04-24 ENCOUNTER — Encounter: Payer: Self-pay | Admitting: Certified Registered Nurse Anesthetist

## 2017-04-24 ENCOUNTER — Ambulatory Visit
Admission: RE | Admit: 2017-04-24 | Discharge: 2017-04-24 | Disposition: A | Discharge status: Home / Self Care | Payer: Medicare Other | Source: Ambulatory Visit | Attending: Gastroenterology | Admitting: Gastroenterology

## 2017-04-24 ENCOUNTER — Encounter
Admission: RE | Disposition: A | Discharge status: Home / Self Care | Payer: Self-pay | Source: Ambulatory Visit | Attending: Gastroenterology

## 2017-04-24 DIAGNOSIS — K5730 Diverticulosis of large intestine without perforation or abscess without bleeding: Secondary | ICD-10-CM | POA: Insufficient documentation

## 2017-04-24 DIAGNOSIS — R1013 Epigastric pain: Secondary | ICD-10-CM | POA: Diagnosis not present

## 2017-04-24 DIAGNOSIS — E785 Hyperlipidemia, unspecified: Secondary | ICD-10-CM | POA: Diagnosis not present

## 2017-04-24 DIAGNOSIS — Z96651 Presence of right artificial knee joint: Secondary | ICD-10-CM | POA: Diagnosis not present

## 2017-04-24 DIAGNOSIS — F0390 Unspecified dementia without behavioral disturbance: Secondary | ICD-10-CM | POA: Insufficient documentation

## 2017-04-24 DIAGNOSIS — M069 Rheumatoid arthritis, unspecified: Secondary | ICD-10-CM | POA: Insufficient documentation

## 2017-04-24 DIAGNOSIS — K219 Gastro-esophageal reflux disease without esophagitis: Secondary | ICD-10-CM | POA: Diagnosis not present

## 2017-04-24 DIAGNOSIS — G7100 Muscular dystrophy, unspecified: Secondary | ICD-10-CM | POA: Diagnosis not present

## 2017-04-24 DIAGNOSIS — R1030 Lower abdominal pain, unspecified: Secondary | ICD-10-CM | POA: Insufficient documentation

## 2017-04-24 DIAGNOSIS — I10 Essential (primary) hypertension: Secondary | ICD-10-CM | POA: Insufficient documentation

## 2017-04-24 DIAGNOSIS — Z5309 Procedure and treatment not carried out because of other contraindication: Secondary | ICD-10-CM | POA: Diagnosis not present

## 2017-04-24 DIAGNOSIS — Z7982 Long term (current) use of aspirin: Secondary | ICD-10-CM | POA: Diagnosis not present

## 2017-04-24 DIAGNOSIS — Z79899 Other long term (current) drug therapy: Secondary | ICD-10-CM | POA: Insufficient documentation

## 2017-04-24 HISTORY — PX: COLONOSCOPY WITH PROPOFOL: SHX5780

## 2017-04-24 HISTORY — PX: ESOPHAGOGASTRODUODENOSCOPY (EGD) WITH PROPOFOL: SHX5813

## 2017-04-24 SURGERY — COLONOSCOPY WITH PROPOFOL
Anesthesia: General

## 2017-04-24 MED ORDER — PROPOFOL 500 MG/50ML IV EMUL
INTRAVENOUS | Status: DC | PRN
  Administered 2017-04-24: 100 ug/kg/min via INTRAVENOUS

## 2017-04-24 MED ORDER — ALBUTEROL SULFATE HFA 108 (90 BASE) MCG/ACT IN AERS
INHALATION_SPRAY | RESPIRATORY_TRACT | Status: AC
  Filled 2017-04-24: qty 6.7

## 2017-04-24 MED ORDER — PROPOFOL 500 MG/50ML IV EMUL
INTRAVENOUS | Status: AC
  Filled 2017-04-24: qty 50

## 2017-04-24 MED ORDER — SODIUM CHLORIDE 0.9 % IV SOLN
INTRAVENOUS | Status: AC
  Filled 2017-04-24: qty 2000

## 2017-04-24 MED ORDER — LIDOCAINE HCL (CARDIAC) 20 MG/ML IV SOLN
INTRAVENOUS | Status: DC | PRN
  Administered 2017-04-24: 25 mg via INTRAVENOUS

## 2017-04-24 MED ORDER — PROPOFOL 10 MG/ML IV BOLUS
INTRAVENOUS | Status: DC | PRN
  Administered 2017-04-24: 90 mg via INTRAVENOUS

## 2017-04-24 MED ORDER — LIDOCAINE HCL (PF) 2 % IJ SOLN
INTRAMUSCULAR | Status: AC
  Filled 2017-04-24: qty 10

## 2017-04-24 MED ORDER — METOPROLOL TARTRATE 5 MG/5ML IV SOLN
INTRAVENOUS | Status: DC | PRN
  Administered 2017-04-24: 2 mg via INTRAVENOUS

## 2017-04-24 MED ORDER — AMPICILLIN SODIUM 2 G IJ SOLR
2.0000 g | Freq: Once | INTRAMUSCULAR | Status: AC
  Administered 2017-04-24: 2 g via INTRAVENOUS

## 2017-04-24 MED ORDER — SODIUM CHLORIDE 0.9 % IV SOLN
INTRAVENOUS | Status: DC
  Administered 2017-04-24: 1000 mL via INTRAVENOUS

## 2017-04-24 MED ORDER — SODIUM CHLORIDE 0.9 % IV SOLN: INTRAVENOUS | Status: DC

## 2017-04-24 MED ORDER — METOPROLOL TARTRATE 5 MG/5ML IV SOLN
INTRAVENOUS | Status: AC
  Filled 2017-04-24: qty 5

## 2017-04-24 NOTE — Op Note (Signed)
[facility] Gastroenterology Patient Name: Sylvia Miller Procedure Date: 04/24/2017 10:02 AM MRN: [id number] Account #: [id number] Date of Birth: 24-Mar-1940 Admit Type: Outpatient Age: 77 Room: [facility] ENDO ROOM 3 Gender: Female Note Status: Finalized Procedure:            Upper GI endoscopy Providers:            Lollie Sails, MD Referring MD:         Dion Body (Referring MD) Complications:        No immediate complications. Procedure:            Pre-Anesthesia Assessment:                       - ASA Grade Assessment: III - A patient with severe                        systemic disease.                       After obtaining informed consent, the endoscope was                        passed under direct vision. Throughout the procedure,                        the patient's blood pressure, pulse, and oxygen                        saturations were monitored continuously. The procedure                        was aborted. The scope was not inserted. Medications                        were given. The upper GI endoscopy was performed with                        difficulty due to the patient's oxygen desaturation. Findings:      I initiallyattempted to place the scope, with the patient starting a       laryngospasm with desaturation. This was managed with suction and       positive pressure oxygen flow. I decided to go on to the colonoscopy and       come back to the EGD afterward. The second attempt resulted in the same       response. Procedure aborted.      aborted procedure/. Impression:           - No specimens collected. Recommendation:       - Discharge patient to home.                       - Do an upper GI series at appointment to be scheduled. Lollie Sails, MD 04/24/2017 11:03:11 AM This report has been signed electronically. Number of Addenda: 0 Note Initiated On: 04/24/2017 10:02 AM      [facility]

## 2017-04-24 NOTE — Anesthesia Preprocedure Evaluation (Signed)
Anesthesia Evaluation  Patient identified by MRN, date of birth, ID band Patient awake    Reviewed: Allergy & Precautions, H&P , NPO status , Patient's Chart, lab work & pertinent test results, reviewed documented beta blocker date and time   Airway Mallampati: II   Neck ROM: full    Dental  (+) Poor Dentition   Pulmonary neg pulmonary ROS, shortness of breath and with exertion,    Pulmonary exam normal        Cardiovascular Exercise Tolerance: Poor hypertension, On Medications + Peripheral Vascular Disease  negative cardio ROS Normal cardiovascular exam+ Valvular Problems/Murmurs  Rhythm:regular Rate:Normal     Neuro/Psych  Neuromuscular disease negative neurological ROS  negative psych ROS   GI/Hepatic negative GI ROS, Neg liver ROS, PUD, GERD  Medicated,  Endo/Other  negative endocrine ROS  Renal/GU negative Renal ROS  negative genitourinary   Musculoskeletal   Abdominal   Peds  Hematology negative hematology ROS (+) anemia ,   Anesthesia Other Findings Past Medical History: No date: Allergy No date: Anemia No date: Arthritis     Comment:  rhematoid /knees/hands/shoulders (Right shoulder)  No date: Bladder incontinence No date: Chicken pox No date: Dyspnea No date: Gastric ulcer No date: GERD (gastroesophageal reflux disease) No date: Heart murmur No date: Hyperlipidemia, unspecified No date: Hypertension     Comment:  controlled with meds;  2000: MDS (myelodysplastic syndrome) ([place]) No date: Muscular dystrophy No date: Myelodysplastic syndrome ([place]) No date: Myelodysplastic syndrome ([place]) No date: Rheumatoid arthritis ([place]) No date: Ulcer Past Surgical History: No date: ABDOMINAL HYSTERECTOMY No date: CHOLECYSTECTOMY No date: COLONOSCOPY 02/21/2017: KNEE ARTHROPLASTY; Right     Comment:  Procedure: COMPUTER ASSISTED TOTAL KNEE ARTHROPLASTY;                Surgeon: Dereck Leep, MD;   Location: [facility] ORS;                Service: Orthopedics;  Laterality: Right; No date: TONSILLECTOMY No date: UPPER GASTROINTESTINAL ENDOSCOPY No date: WISDOM TOOTH EXTRACTION BMI    Body Mass Index:  31.16 kg/m     Reproductive/Obstetrics negative OB ROS                             Anesthesia Physical Anesthesia Plan  ASA: III  Anesthesia Plan: General   Post-op Pain Management:    Induction:   PONV Risk Score and Plan:   Airway Management Planned:   Additional Equipment:   Intra-op Plan:   Post-operative Plan:   Informed Consent: I have reviewed the patients History and Physical, chart, labs and discussed the procedure including the risks, benefits and alternatives for the proposed anesthesia with the patient or authorized representative who has indicated his/her understanding and acceptance.   Dental Advisory Given  Plan Discussed with: CRNA  Anesthesia Plan Comments:         Anesthesia Quick Evaluation

## 2017-04-24 NOTE — Anesthesia Postprocedure Evaluation (Signed)
Anesthesia Post Note  Patient: Neftaly Inzunza  Procedure(s) Performed: COLONOSCOPY WITH PROPOFOL (N/A ) ESOPHAGOGASTRODUODENOSCOPY (EGD) WITH PROPOFOL (N/A )  Anesthesia Type: General Cardiovascular status: stable Anesthetic complications: no     Last Vitals:  Vitals:   04/24/17 1110 04/24/17 1114  BP: (!) 134/108   Pulse: (!) 125 77  Resp: 17   Temp: (!) 35.7 C (!) 36 C  SpO2: (!) 88% 97%    Last Pain:  Vitals:   04/24/17 1114  TempSrc: Tympanic                 Carron Curie

## 2017-04-24 NOTE — Transfer of Care (Signed)
Immediate Anesthesia Transfer of Care Note  Patient: Marlowe Cinquemani  Procedure(s) Performed: COLONOSCOPY WITH PROPOFOL (N/A ) ESOPHAGOGASTRODUODENOSCOPY (EGD) WITH PROPOFOL (N/A )  Patient Location: PACU  Anesthesia Type:MAC  Level of Consciousness: sedated  Airway & Oxygen Therapy: Patient Spontanous Breathing  Post-op Assessment: Report given to RN  Post vital signs: stable  Last Vitals:  Vitals:   04/24/17 1110 04/24/17 1114  BP: (!) 134/108   Pulse: (!) 125 77  Resp: 17   Temp: (!) 35.7 C (!) 36 C  SpO2: (!) 88% 97%    Last Pain:  Vitals:   04/24/17 1114  TempSrc: Tympanic         Complications: No apparent anesthesia complications

## 2017-04-24 NOTE — Brief Op Note (Signed)
EGD attempted before colonoscopy, delayed until after colonoscopy due to laryngospasm.. EGD eventually aborted after second attempt. See anesthesia record.

## 2017-04-24 NOTE — Anesthesia Postprocedure Evaluation (Signed)
Anesthesia Post Note  Patient: Sylvia Miller  Procedure(s) Performed: COLONOSCOPY WITH PROPOFOL (N/A ) ESOPHAGOGASTRODUODENOSCOPY (EGD) WITH PROPOFOL (N/A )  Patient location during evaluation: PACU Anesthesia Type: General Level of consciousness: awake and alert Pain management: pain level controlled Vital Signs Assessment: post-procedure vital signs reviewed and stable Respiratory status: spontaneous breathing, nonlabored ventilation, respiratory function stable and patient connected to nasal cannula oxygen Cardiovascular status: blood pressure returned to baseline and stable Postop Assessment: no apparent nausea or vomiting Anesthetic complications: no     Last Vitals:  Vitals:   04/24/17 1130 04/24/17 1143  BP: (!) 157/62 (!) 177/78  Pulse: 64 62  Resp: 13 18  Temp:    SpO2: 100% 100%    Last Pain:  Vitals:   04/24/17 1114  TempSrc: Tympanic                 Molli Barrows

## 2017-04-24 NOTE — Op Note (Signed)
[facility] Gastroenterology Patient Name: Sylvia Miller Procedure Date: 04/24/2017 10:01 AM MRN: [id number] Account #: [id number] Date of Birth: [birth year] Admit Type: Outpatient Age: 77 Room: [facility] ENDO ROOM 3 Gender: Female Note Status: Finalized Procedure:            Colonoscopy Indications:          Lower abdominal pain Providers:            Lollie Sails, MD Referring MD:         Dion Body (Referring MD) Medicines:            Monitored Anesthesia Care Complications:        No immediate complications. Procedure:            Pre-Anesthesia Assessment:                       - ASA Grade Assessment: III - A patient with severe                        systemic disease.                       After obtaining informed consent, the colonoscope was                        passed under direct vision. Throughout the procedure,                        the patient's blood pressure, pulse, and oxygen                        saturations were monitored continuously. The                        Colonoscope was introduced through the anus and                        advanced to the the cecum, identified by appendiceal                        orifice and ileocecal valve. The patient tolerated the                        procedure well. The quality of the bowel preparation                        was good. Findings:      Multiple small-mouthed diverticula were found in the sigmoid colon,       descending colon, transverse colon and ascending colon.      The exam was otherwise normal throughout the examined colon.      The digital rectal exam was normal. Impression:           - Diverticulosis in the sigmoid colon, in the                        descending colon, in the transverse colon and in the                        ascending colon.                       -  No specimens collected. Recommendation:       - Discharge patient to home.                       - will re-attempt EGD  today                       - Use Citrucel one tablespoon PO daily daily. Procedure Code(s):    --- Professional ---                       [phone], Colonoscopy, flexible; diagnostic, including                        collection of specimen(s) by brushing or washing, when                        performed (separate procedure) Diagnosis Code(s):    --- Professional ---                       R10.30, Lower abdominal pain, unspecified                       K57.30, Diverticulosis of large intestine without                        perforation or abscess without bleeding CPT copyright 2016 American Medical Association. All rights reserved. The codes documented in this report are preliminary and upon coder review may  be revised to meet current compliance requirements. Lollie Sails, MD 04/24/2017 10:52:27 AM This report has been signed electronically. Number of Addenda: 0 Note Initiated On: 04/24/2017 10:01 AM Scope Withdrawal Time: 0 hours 4 minutes 26 seconds  Total Procedure Duration: 0 hours 21 minutes 46 seconds       [facility]

## 2017-04-24 NOTE — Anesthesia Post-op Follow-up Note (Signed)
Anesthesia QCDR form completed.        

## 2017-04-24 NOTE — Anesthesia Postprocedure Evaluation (Signed)
Anesthesia Post Note  Patient: Sylvia Miller  Procedure(s) Performed: COLONOSCOPY WITH PROPOFOL (N/A ) ESOPHAGOGASTRODUODENOSCOPY (EGD) WITH PROPOFOL (N/A )  Patient location during evaluation: PACU Anesthesia Type: MAC Level of consciousness: sedated (Remains slightly sedated.  But follows commands and moving all extremities.)     Last Vitals:  Vitals:   04/24/17 1110 04/24/17 1114  BP: (!) 134/108   Pulse: (!) 125 77  Resp: 17   Temp: (!) 35.7 C (!) 36 C  SpO2: (!) 88% 97%    Last Pain:  Vitals:   04/24/17 1114  TempSrc: Tympanic                 Carron Curie

## 2017-04-24 NOTE — Progress Notes (Signed)
Patient waiting to talk to the MD

## 2017-04-24 NOTE — H&P (Signed)
Outpatient short stay form Pre-procedure 04/24/2017 9:59 AM Sylvia Sails MD  Primary Physician: Dr Dion Body  Reason for visit: EGD and colonoscopy  History of present illness: Patient is a 77 year old female presenting today as above.  She does have a personal history of probable diverticulitis.  Was noted on CT scan.  He also has a history of epigastric discomfort that was improved with proton pump inhibitor about 6 months ago however that has also recurred.  She does take meloxicam.  She tolerated her prep.  Takes a 81 mg aspirin as well.  She takes no other blood thinning agent.  Consent is obtained from her husband as patient has a form of dementia.  Also had a replacement done over 6 weeks ago.  I discussed this with her surgeon and she is receiving antibiotic prophylaxis today.      Current Facility-Administered Medications:  .  0.9 %  sodium chloride infusion, , Intravenous, Continuous, Sylvia Sails, MD, Last Rate: 20 mL/hr at 04/24/17 0942, 1,000 mL at 04/24/17 0942 .  0.9 %  sodium chloride infusion, , Intravenous, Continuous, Sylvia Sails, MD .  ampicillin (OMNIPEN) 2 g in sodium chloride 0.9 % 100 mL IVPB, 2 g, Intravenous, Once, Sylvia Sails, MD, Last Rate: 300 mL/hr at 04/24/17 0945, 2 g at 04/24/17 0945 .  sodium chloride 0.9 % with ampicillin (OMNIPEN) ADS Med, , , ,   Medications Prior to Admission  Medication Sig Dispense Refill Last Dose  . acetaminophen (TYLENOL) 325 MG tablet Take 325-650 mg by mouth every 6 (six) hours as needed for moderate pain or headache.    Past Week at Unknown time  . Artificial Tear Solution (GENTEAL TEARS) 0.1-0.2-0.3 % SOLN Place 1 drop into both eyes daily as needed.   04/23/2017 at Unknown time  . aspirin EC 81 MG tablet Take 81 mg by mouth daily.   Past Week at Unknown time  . atorvastatin (LIPITOR) 10 MG tablet Take 5 mg by mouth daily.   04/23/2017 at Unknown time  . B Complex-C (SUPER B COMPLEX PO) Take 1  tablet by mouth daily.   04/23/2017 at Unknown time  . calcium-vitamin D (OSCAL WITH D) 250-125 MG-UNIT tablet Take 1 tablet by mouth daily.   Past Week at Unknown time  . Cholecalciferol (VITAMIN D3) 5000 units CAPS Take 5,000 Units by mouth daily.   Past Week at Unknown time  . cyanocobalamin (,VITAMIN B-12,) 1000 MCG/ML injection Inject 1,000 mcg into the muscle every 30 (thirty) days.    Past Week at Unknown time  . Cyanocobalamin (VITAMIN B-12) 5000 MCG TBDP Take 5,000 mcg by mouth daily.    Past Week at Unknown time  . diclofenac sodium (VOLTAREN) 1 % GEL Apply 2 g topically 4 (four) times daily as needed. Apply to painful joint   04/23/2017 at Unknown time  . diphenhydrAMINE (BENADRYL) 25 mg capsule Take 25 mg by mouth every 6 (six) hours as needed for allergies.   Past Week at Unknown time  . folic acid (FOLVITE) 329 MCG tablet Take 800 mcg by mouth daily.   04/23/2017 at Unknown time  . guaiFENesin (MUCINEX) 600 MG 12 hr tablet Take by mouth as needed.   04/23/2017 at Unknown time  . lisinopril (PRINIVIL,ZESTRIL) 5 MG tablet Take 5 mg by mouth daily.    04/23/2017 at Unknown time  . meloxicam (MOBIC) 7.5 MG tablet Take 7.5 mg by mouth as needed for pain.   04/23/2017 at Unknown  time  . Menthol, Topical Analgesic, (BIOFREEZE EX) Apply 1 application topically as needed (for knee/leg pain).   04/23/2017 at Unknown time  . Misc Natural Products (GLUCOSAMINE CHOND COMPLEX/MSM PO) Take by mouth.   04/23/2017 at Unknown time  . Misc Natural Products (OSTEO BI-FLEX TRIPLE STRENGTH) TABS Take 1 tablet by mouth daily.   04/23/2017 at Unknown time  . Multiple Vitamins-Minerals (OCUVITE PO) Take 1 tablet by mouth daily.   04/23/2017 at Unknown time  . Omega 3-6-9 Fatty Acids (TRIPLE OMEGA-3-6-9) CAPS Take 1 capsule by mouth daily.   04/23/2017 at Unknown time  . oxybutynin (DITROPAN-XL) 10 MG 24 hr tablet Take 10 mg by mouth daily.  0 04/23/2017 at Unknown time  . oxyCODONE (OXY IR/ROXICODONE) 5 MG immediate  release tablet Take 1 tablet (5 mg total) by mouth every 3 (three) hours as needed for moderate pain ((score 4 to 6)). 120 tablet 0 04/23/2017 at Unknown time  . pantoprazole (PROTONIX) 40 MG tablet Take 40 mg by mouth daily.    04/23/2017 at Unknown time  . ranitidine (ZANTAC) 150 MG tablet Take 150 mg by mouth daily as needed for heartburn.    04/23/2017 at Unknown time  . sulfaSALAzine (AZULFIDINE) 500 MG tablet Take 1,000 mg by mouth 2 (two) times daily.    04/23/2017 at Unknown time  . traMADol (ULTRAM) 50 MG tablet Take 1-2 tablets (50-100 mg total) by mouth every 4 (four) hours as needed for moderate pain. 30 tablet 0 04/23/2017 at Unknown time     No Known Allergies   Past Medical History:  Diagnosis Date  . Allergy   . Anemia   . Arthritis    rhematoid /knees/hands/shoulders (Right shoulder)   . Bladder incontinence   . Chicken pox   . Dyspnea   . Gastric ulcer   . GERD (gastroesophageal reflux disease)   . Heart murmur   . Hyperlipidemia, unspecified   . Hypertension    controlled with meds;   Marland Kitchen MDS (myelodysplastic syndrome) ([place]) 2000  . Muscular dystrophy   . Myelodysplastic syndrome ([place])   . Myelodysplastic syndrome ([place])   . Rheumatoid arthritis ([place])   . Ulcer     Review of systems:      Physical Exam    Heart and lungs: Without rub or gallop, lungs are bilaterally clear.    HEENT: Normocephalic atraumatic eyes are anicteric    Other:    Pertinant exam for procedure: Soft nontender nondistended bowel sounds positive normoactive    Planned proceedures: EGD, colonoscopy and indicated procedures. I have discussed the risks benefits and complications of procedures to include not limited to bleeding, infection, perforation and the risk of sedation and the patient wishes to proceed.    Sylvia Sails, MD Gastroenterology 04/24/2017  9:59 AM

## 2017-04-30 ENCOUNTER — Other Ambulatory Visit: Payer: Self-pay | Admitting: Gastroenterology

## 2017-04-30 DIAGNOSIS — R1013 Epigastric pain: Secondary | ICD-10-CM

## 2017-04-30 DIAGNOSIS — R109 Unspecified abdominal pain: Secondary | ICD-10-CM

## 2017-05-03 ENCOUNTER — Ambulatory Visit
Admission: RE | Admit: 2017-05-03 | Discharge: 2017-05-03 | Disposition: A | Discharge status: Home / Self Care | Payer: Medicare Other | Source: Ambulatory Visit | Attending: Gastroenterology | Admitting: Gastroenterology

## 2017-05-03 DIAGNOSIS — K219 Gastro-esophageal reflux disease without esophagitis: Secondary | ICD-10-CM | POA: Diagnosis not present

## 2017-05-03 DIAGNOSIS — K224 Dyskinesia of esophagus: Secondary | ICD-10-CM | POA: Diagnosis not present

## 2017-05-03 DIAGNOSIS — R1013 Epigastric pain: Secondary | ICD-10-CM

## 2017-05-03 DIAGNOSIS — K449 Diaphragmatic hernia without obstruction or gangrene: Secondary | ICD-10-CM | POA: Insufficient documentation

## 2017-05-03 DIAGNOSIS — R109 Unspecified abdominal pain: Secondary | ICD-10-CM | POA: Diagnosis present

## 2017-07-24 ENCOUNTER — Encounter
Admission: RE | Admit: 2017-07-24 | Discharge: 2017-07-24 | Disposition: A | Discharge status: Home / Self Care | Payer: Medicare Other | Source: Ambulatory Visit | Attending: Orthopedic Surgery | Admitting: Orthopedic Surgery

## 2017-07-24 ENCOUNTER — Other Ambulatory Visit: Payer: Self-pay

## 2017-07-24 DIAGNOSIS — Z01812 Encounter for preprocedural laboratory examination: Secondary | ICD-10-CM | POA: Insufficient documentation

## 2017-07-24 HISTORY — DX: Pure hypercholesterolemia, unspecified: E78.00

## 2017-07-24 HISTORY — DX: Other amnesia: R41.3

## 2017-07-24 LAB — CBC
HCT: 35.9 % (ref 35.0–47.0)
HEMOGLOBIN: 12 g/dL (ref 12.0–16.0)
MCH: 32.1 pg (ref 26.0–34.0)
MCHC: 33.5 g/dL (ref 32.0–36.0)
MCV: 95.8 fL (ref 80.0–100.0)
PLATELETS: 177 10*3/uL (ref 150–440)
RBC: 3.75 MIL/uL — AB (ref 3.80–5.20)
RDW: 15.3 % — ABNORMAL HIGH (ref 11.5–14.5)
WBC: 4.6 10*3/uL (ref 3.6–11.0)

## 2017-07-24 LAB — COMPREHENSIVE METABOLIC PANEL
ALK PHOS: 115 U/L (ref 38–126)
ALT: 12 U/L — AB (ref 14–54)
AST: 22 U/L (ref 15–41)
Albumin: 4.2 g/dL (ref 3.5–5.0)
Anion gap: 5 (ref 5–15)
BUN: 22 mg/dL — AB (ref 6–20)
CALCIUM: 9.2 mg/dL (ref 8.9–10.3)
CHLORIDE: 110 mmol/L (ref 101–111)
CO2: 24 mmol/L (ref 22–32)
CREATININE: 0.69 mg/dL (ref 0.44–1.00)
Glucose, Bld: 85 mg/dL (ref 65–99)
Potassium: 3.8 mmol/L (ref 3.5–5.1)
SODIUM: 139 mmol/L (ref 135–145)
Total Bilirubin: 0.7 mg/dL (ref 0.3–1.2)
Total Protein: 7.2 g/dL (ref 6.5–8.1)

## 2017-07-24 LAB — SEDIMENTATION RATE: Sed Rate: 28 mm/hr (ref 0–30)

## 2017-07-24 LAB — URINALYSIS, ROUTINE W REFLEX MICROSCOPIC
Bilirubin Urine: NEGATIVE
Glucose, UA: NEGATIVE mg/dL
Hgb urine dipstick: NEGATIVE
Ketones, ur: NEGATIVE mg/dL
LEUKOCYTES UA: NEGATIVE
NITRITE: NEGATIVE
PROTEIN: NEGATIVE mg/dL
Specific Gravity, Urine: 1.018 (ref 1.005–1.030)
pH: 5 (ref 5.0–8.0)

## 2017-07-24 LAB — TYPE AND SCREEN
ABO/RH(D): O NEG
Antibody Screen: NEGATIVE

## 2017-07-24 LAB — SURGICAL PCR SCREEN
MRSA, PCR: NEGATIVE
Staphylococcus aureus: POSITIVE — AB

## 2017-07-24 LAB — C-REACTIVE PROTEIN: CRP: <0.8 mg/dL (ref <1.0)

## 2017-07-24 LAB — PROTIME-INR
INR: 0.97
Prothrombin Time: 12.8 seconds (ref 11.4–15.2)

## 2017-07-24 LAB — APTT: aPTT: 32 seconds (ref 24–36)

## 2017-07-24 NOTE — Pre-Procedure Instructions (Signed)
Provided instructions to patient and her husband regarding the proper use of an incentive spirometer. Patient able to provide a return demonstration accurately. Requested patient to bring the device back on day of surgery.

## 2017-07-24 NOTE — Patient Instructions (Signed)
Your procedure is scheduled on: Monday, August 06, 2017  Report to [place]. DO NOT STOP ON THE FIRST FLOOR TO REGISTER.   To find out your arrival time please call [phone] between 1PM - 3PM on Friday, MAY 31,2019  Remember: Instructions that are not followed completely may result in serious  medical risk, up to and including death, or upon the discretion of your surgeon  and anesthesiologist your surgery may need to be rescheduled.     _X__ 1. Do not eat food after midnight the night before your procedure.                 No gum chewing or hard candies.                   You may drink clear liquids up to 2 hours before you are scheduled to arrive for                  your surgery- DO not drink clear liquids within 2 hours of the start of your surgery.                  Clear Liquids include:  water, apple juice without pulp, clear carbohydrate                 drink such as Clearfast of Gartorade, Black Coffee or Tea (Do not add                 anything to coffee or tea).  __X__2.  On the morning of surgery brush your teeth with toothpaste and water,                     you may rinse your mouth with mouthwash if you wish.                         Do not swallow any toothpaste of mouthwash.     _X__ 3.  No Alcohol for 24 hours before or after surgery.   _X__ 4.  Do Not Smoke or use e-cigarettes For 24 Hours Prior to Your Surgery.                 Do not use any chewable tobacco products for at least 6 hours prior to                 surgery.  ____  5.  Bring all medications with you on the day of surgery if instructed.   ____  6.  Notify your doctor if there is any change in your medical condition      (cold, fever, infections).     Do not wear jewelry, make-up, hairpins, clips or nail polish. Do not wear lotions, powders, or perfumes. You may wear deodorant. Do not shave 48 hours prior to surgery. Men may shave face and neck. Do  not bring valuables to the hospital.    [facility] is not responsible for any belongings or valuables.  Contacts, dentures or bridgework may not be worn into surgery. Leave your suitcase in the car. After surgery it may be brought to your room. For patients admitted to the hospital, discharge time is determined by your treatment team.   Patients discharged the day of surgery will not be allowed to drive home.   Please read over the following fact sheets that you were given:   PREPARING  FOR SURGERY   ____ Take these medicines the morning of surgery with A SIP OF WATER:    1. ATORVASTATIN  2. ARICEPT/DONEPEZIL  3. PANTOPRAZOLE/PRILOSEC  4. ZANTAC  5. OASIS TEARS, IF NEEDED  6.  ____ Fleet Enema (as directed)   _X___ Use CHG Soap as directed  _X___ Stop ALL ASPIRIN PRODUCTS AS OF Jul 30, 2017             THIS INCLUDES BABY ASPIRIN  __X__ Stop Anti-inflammatories on MAY 27,2019              THIS INCLUDES AZULFIDINE / IBUPROFEN / MOTRIN / ADVIL / ALEVE / MELOXICAM     **YOU MAY USE TYLENOL ANYTIME THAT IT IS NEEDED.**   _X___ Stop supplements until after surgery.                THIS INCLUDES B COMPLEX / FOLIC ACID / GLUCOSAMINE CHONDROINTON / OSTEO BIFLEX / TRIPLE OMEGA 3-6-9 /                  VOLTAREN GEL AS OF Jul 30, 2017  YOU MAY CONTINUE TO TAKE VITAMIN D3 AND VITAMIN B12 UP UNTIL SURGERY BUT NOT ON THE DAY OF SURGERY.  CONTINUE TAKING LISINOPRIL AND OXYBUTYNIN AS USUAL, BUT DO NOT TAKE EITHER ON THE MORNING OF SURGERY.  YOU MAY CONTINUE USING BIOFREEZE AND SOMBRA THERAPY AS NEEDED. DO NOT APPLY EITHER OF THESE ON YOUR SKIN AFTER SHOWING WITH THE    SURGICAL SCRUB SOAP.  WEAR COMFORTABLE AND STURDY SHOES FOR IN THE HOSPITAL.

## 2017-07-25 LAB — URINE CULTURE
Culture: NO GROWTH
Special Requests: NORMAL

## 2017-08-05 MED ORDER — TRANEXAMIC ACID 1000 MG/10ML IV SOLN
1000.0000 mg | INTRAVENOUS | Status: AC
  Administered 2017-08-06: 1000 mg via INTRAVENOUS
  Filled 2017-08-05: qty 1100

## 2017-08-05 MED ORDER — CEFAZOLIN SODIUM-DEXTROSE 2-4 GM/100ML-% IV SOLN
2.0000 g | INTRAVENOUS | Status: AC
  Administered 2017-08-06: 2 g via INTRAVENOUS

## 2017-08-06 ENCOUNTER — Encounter: Payer: Self-pay | Admitting: Orthopedic Surgery

## 2017-08-06 ENCOUNTER — Inpatient Hospital Stay: Payer: Medicare Other | Admitting: Certified Registered Nurse Anesthetist

## 2017-08-06 ENCOUNTER — Inpatient Hospital Stay
Admission: RE | Admit: 2017-08-06 | Discharge: 2017-08-09 | DRG: 470 | Disposition: A | Discharge status: Skilled Nursing Facility | Payer: Medicare Other | Attending: Orthopedic Surgery | Admitting: Orthopedic Surgery

## 2017-08-06 ENCOUNTER — Inpatient Hospital Stay: Payer: Medicare Other

## 2017-08-06 ENCOUNTER — Other Ambulatory Visit: Payer: Self-pay

## 2017-08-06 ENCOUNTER — Encounter
Admission: RE | Disposition: A | Discharge status: Skilled Nursing Facility | Payer: Self-pay | Source: Home / Self Care | Attending: Orthopedic Surgery

## 2017-08-06 DIAGNOSIS — Z7982 Long term (current) use of aspirin: Secondary | ICD-10-CM

## 2017-08-06 DIAGNOSIS — Z96651 Presence of right artificial knee joint: Secondary | ICD-10-CM | POA: Diagnosis present

## 2017-08-06 DIAGNOSIS — M1712 Unilateral primary osteoarthritis, left knee: Principal | ICD-10-CM | POA: Diagnosis present

## 2017-08-06 DIAGNOSIS — Z791 Long term (current) use of non-steroidal anti-inflammatories (NSAID): Secondary | ICD-10-CM | POA: Diagnosis not present

## 2017-08-06 DIAGNOSIS — M25762 Osteophyte, left knee: Secondary | ICD-10-CM | POA: Diagnosis present

## 2017-08-06 DIAGNOSIS — R32 Unspecified urinary incontinence: Secondary | ICD-10-CM | POA: Diagnosis present

## 2017-08-06 DIAGNOSIS — E785 Hyperlipidemia, unspecified: Secondary | ICD-10-CM | POA: Diagnosis present

## 2017-08-06 DIAGNOSIS — D62 Acute posthemorrhagic anemia: Secondary | ICD-10-CM | POA: Diagnosis not present

## 2017-08-06 DIAGNOSIS — R413 Other amnesia: Secondary | ICD-10-CM | POA: Diagnosis present

## 2017-08-06 DIAGNOSIS — Z79899 Other long term (current) drug therapy: Secondary | ICD-10-CM | POA: Diagnosis not present

## 2017-08-06 DIAGNOSIS — Z801 Family history of malignant neoplasm of trachea, bronchus and lung: Secondary | ICD-10-CM

## 2017-08-06 DIAGNOSIS — Z8711 Personal history of peptic ulcer disease: Secondary | ICD-10-CM

## 2017-08-06 DIAGNOSIS — M069 Rheumatoid arthritis, unspecified: Secondary | ICD-10-CM | POA: Diagnosis present

## 2017-08-06 DIAGNOSIS — Z96659 Presence of unspecified artificial knee joint: Secondary | ICD-10-CM

## 2017-08-06 DIAGNOSIS — I10 Essential (primary) hypertension: Secondary | ICD-10-CM | POA: Diagnosis present

## 2017-08-06 DIAGNOSIS — G7100 Muscular dystrophy, unspecified: Secondary | ICD-10-CM | POA: Diagnosis present

## 2017-08-06 DIAGNOSIS — D469 Myelodysplastic syndrome, unspecified: Secondary | ICD-10-CM | POA: Diagnosis present

## 2017-08-06 DIAGNOSIS — K219 Gastro-esophageal reflux disease without esophagitis: Secondary | ICD-10-CM | POA: Diagnosis present

## 2017-08-06 DIAGNOSIS — Z9049 Acquired absence of other specified parts of digestive tract: Secondary | ICD-10-CM

## 2017-08-06 HISTORY — PX: KNEE ARTHROPLASTY: SHX992

## 2017-08-06 LAB — ABO/RH: ABO/RH(D): O NEG

## 2017-08-06 SURGERY — ARTHROPLASTY, KNEE, TOTAL, USING IMAGELESS COMPUTER-ASSISTED NAVIGATION
Anesthesia: General | Site: Knee | Laterality: Left | Wound class: Clean

## 2017-08-06 MED ORDER — BUPIVACAINE HCL (PF) 0.5 % IJ SOLN
INTRAMUSCULAR | Status: AC
  Filled 2017-08-06: qty 10

## 2017-08-06 MED ORDER — CEFAZOLIN SODIUM-DEXTROSE 2-4 GM/100ML-% IV SOLN
INTRAVENOUS | Status: AC
  Filled 2017-08-06: qty 100

## 2017-08-06 MED ORDER — DEXAMETHASONE SODIUM PHOSPHATE 10 MG/ML IJ SOLN
8.0000 mg | Freq: Once | INTRAMUSCULAR | Status: AC
  Administered 2017-08-06: 8 mg via INTRAVENOUS

## 2017-08-06 MED ORDER — BUPIVACAINE LIPOSOME 1.3 % IJ SUSP
INTRAMUSCULAR | Status: AC
  Filled 2017-08-06: qty 20

## 2017-08-06 MED ORDER — MENTHOL 3 MG MT LOZG
1.0000 | LOZENGE | OROMUCOSAL | Status: DC | PRN
  Filled 2017-08-06: qty 9

## 2017-08-06 MED ORDER — GLYCOPYRROLATE 0.2 MG/ML IJ SOLN
INTRAMUSCULAR | Status: DC | PRN
  Administered 2017-08-06: 0.2 mg via INTRAVENOUS

## 2017-08-06 MED ORDER — PHENOL 1.4 % MT LIQD
1.0000 | OROMUCOSAL | Status: DC | PRN
  Filled 2017-08-06: qty 177

## 2017-08-06 MED ORDER — ATORVASTATIN CALCIUM 10 MG PO TABS
5.0000 mg | ORAL_TABLET | Freq: Every day | ORAL | Status: DC
  Administered 2017-08-07 – 2017-08-09 (×3): 5 mg via ORAL
  Filled 2017-08-06 (×3): qty 1

## 2017-08-06 MED ORDER — ONDANSETRON HCL 4 MG/2ML IJ SOLN: 4.0000 mg | Freq: Four times a day (QID) | INTRAMUSCULAR | Status: DC | PRN

## 2017-08-06 MED ORDER — SODIUM CHLORIDE 0.9 % IV SOLN
INTRAVENOUS | Status: DC
  Administered 2017-08-06 (×2): via INTRAVENOUS

## 2017-08-06 MED ORDER — DIPHENHYDRAMINE HCL 12.5 MG/5ML PO ELIX: 12.5000 mg | ORAL_SOLUTION | ORAL | Status: DC | PRN

## 2017-08-06 MED ORDER — CHLORHEXIDINE GLUCONATE 4 % EX LIQD
60.0000 mL | Freq: Once | CUTANEOUS | Status: AC
  Administered 2017-08-06: 4 via TOPICAL

## 2017-08-06 MED ORDER — OCUVITE-LUTEIN PO CAPS
1.0000 | ORAL_CAPSULE | Freq: Every day | ORAL | Status: DC
  Administered 2017-08-07 – 2017-08-09 (×3): 1 via ORAL
  Filled 2017-08-06 (×4): qty 1

## 2017-08-06 MED ORDER — FENTANYL CITRATE (PF) 250 MCG/5ML IJ SOLN
INTRAMUSCULAR | Status: AC
  Filled 2017-08-06: qty 5

## 2017-08-06 MED ORDER — ACETAMINOPHEN 10 MG/ML IV SOLN
INTRAVENOUS | Status: AC
  Filled 2017-08-06: qty 100

## 2017-08-06 MED ORDER — BUPIVACAINE HCL (PF) 0.25 % IJ SOLN
INTRAMUSCULAR | Status: DC | PRN
  Administered 2017-08-06: 60 mL

## 2017-08-06 MED ORDER — CELECOXIB 200 MG PO CAPS
400.0000 mg | ORAL_CAPSULE | Freq: Once | ORAL | Status: AC
Start: 2017-08-06 — End: 2017-08-06
  Administered 2017-08-06: 400 mg via ORAL

## 2017-08-06 MED ORDER — KETAMINE HCL 50 MG/ML IJ SOLN
INTRAMUSCULAR | Status: AC
Start: 2017-08-06 — End: ?
  Filled 2017-08-06: qty 10

## 2017-08-06 MED ORDER — FENTANYL CITRATE (PF) 100 MCG/2ML IJ SOLN
INTRAMUSCULAR | Status: AC
  Filled 2017-08-06: qty 2

## 2017-08-06 MED ORDER — PHENYLEPHRINE HCL 10 MG/ML IJ SOLN
INTRAMUSCULAR | Status: AC
  Filled 2017-08-06: qty 1

## 2017-08-06 MED ORDER — GABAPENTIN 300 MG PO CAPS
ORAL_CAPSULE | ORAL | Status: AC
  Filled 2017-08-06: qty 1

## 2017-08-06 MED ORDER — ROCURONIUM BROMIDE 50 MG/5ML IV SOLN
INTRAVENOUS | Status: AC
  Filled 2017-08-06: qty 1

## 2017-08-06 MED ORDER — ALUM & MAG HYDROXIDE-SIMETH 200-200-20 MG/5ML PO SUSP: 30.0000 mL | ORAL | Status: DC | PRN

## 2017-08-06 MED ORDER — ACETAMINOPHEN 10 MG/ML IV SOLN
INTRAVENOUS | Status: DC | PRN
  Administered 2017-08-06: 1000 mg via INTRAVENOUS

## 2017-08-06 MED ORDER — METOCLOPRAMIDE HCL 10 MG PO TABS
10.0000 mg | ORAL_TABLET | Freq: Three times a day (TID) | ORAL | Status: AC
  Administered 2017-08-06 – 2017-08-08 (×7): 10 mg via ORAL
  Filled 2017-08-06 (×7): qty 1

## 2017-08-06 MED ORDER — PROPOFOL 500 MG/50ML IV EMUL
INTRAVENOUS | Status: AC
  Filled 2017-08-06: qty 50

## 2017-08-06 MED ORDER — TRANEXAMIC ACID 1000 MG/10ML IV SOLN
1000.0000 mg | Freq: Once | INTRAVENOUS | Status: AC
  Administered 2017-08-06: 1000 mg via INTRAVENOUS
  Filled 2017-08-06: qty 1100

## 2017-08-06 MED ORDER — SODIUM CHLORIDE 0.9 % IV SOLN
INTRAVENOUS | Status: DC | PRN
  Administered 2017-08-06: 60 mL

## 2017-08-06 MED ORDER — HYDROMORPHONE HCL 1 MG/ML IJ SOLN: 0.5000 mg | INTRAMUSCULAR | Status: DC | PRN

## 2017-08-06 MED ORDER — ONDANSETRON HCL 4 MG PO TABS: 4.0000 mg | ORAL_TABLET | Freq: Four times a day (QID) | ORAL | Status: DC | PRN

## 2017-08-06 MED ORDER — LIDOCAINE HCL (PF) 2 % IJ SOLN
INTRAMUSCULAR | Status: AC
  Filled 2017-08-06: qty 10

## 2017-08-06 MED ORDER — EPHEDRINE SULFATE 50 MG/ML IJ SOLN
INTRAMUSCULAR | Status: AC
  Filled 2017-08-06: qty 1

## 2017-08-06 MED ORDER — FLEET ENEMA 7-19 GM/118ML RE ENEM: 1.0000 | ENEMA | Freq: Once | RECTAL | Status: DC | PRN

## 2017-08-06 MED ORDER — SUGAMMADEX SODIUM 200 MG/2ML IV SOLN
INTRAVENOUS | Status: AC
  Filled 2017-08-06: qty 2

## 2017-08-06 MED ORDER — METOCLOPRAMIDE HCL 5 MG/ML IJ SOLN
5.0000 mg | Freq: Three times a day (TID) | INTRAMUSCULAR | Status: DC | PRN
Start: 2017-08-06 — End: 2017-08-09

## 2017-08-06 MED ORDER — CELECOXIB 200 MG PO CAPS
200.0000 mg | ORAL_CAPSULE | Freq: Two times a day (BID) | ORAL | Status: DC
  Administered 2017-08-06 – 2017-08-09 (×7): 200 mg via ORAL
  Filled 2017-08-06 (×7): qty 1

## 2017-08-06 MED ORDER — CELECOXIB 200 MG PO CAPS
ORAL_CAPSULE | ORAL | Status: AC
  Filled 2017-08-06: qty 2

## 2017-08-06 MED ORDER — LABETALOL HCL 5 MG/ML IV SOLN
INTRAVENOUS | Status: DC | PRN
  Administered 2017-08-06: 5 mg via INTRAVENOUS
  Administered 2017-08-06: 10 mg via INTRAVENOUS

## 2017-08-06 MED ORDER — OXYCODONE HCL 5 MG PO TABS
10.0000 mg | ORAL_TABLET | ORAL | Status: DC | PRN
  Administered 2017-08-07 – 2017-08-08 (×6): 10 mg via ORAL
  Filled 2017-08-06 (×6): qty 2

## 2017-08-06 MED ORDER — MAGNESIUM HYDROXIDE 400 MG/5ML PO SUSP
30.0000 mL | Freq: Every day | ORAL | Status: DC
  Administered 2017-08-07 – 2017-08-09 (×3): 30 mL via ORAL
  Filled 2017-08-06 (×3): qty 30

## 2017-08-06 MED ORDER — METOCLOPRAMIDE HCL 10 MG PO TABS
5.0000 mg | ORAL_TABLET | Freq: Three times a day (TID) | ORAL | Status: DC | PRN
Start: 2017-08-06 — End: 2017-08-09

## 2017-08-06 MED ORDER — ACETAMINOPHEN 325 MG PO TABS
325.0000 mg | ORAL_TABLET | Freq: Four times a day (QID) | ORAL | Status: DC | PRN
  Administered 2017-08-07: 650 mg via ORAL
  Filled 2017-08-06: qty 2

## 2017-08-06 MED ORDER — GABAPENTIN 300 MG PO CAPS
300.0000 mg | ORAL_CAPSULE | Freq: Once | ORAL | Status: AC
  Administered 2017-08-06: 300 mg via ORAL

## 2017-08-06 MED ORDER — KETAMINE HCL 50 MG/ML IJ SOLN
INTRAMUSCULAR | Status: DC | PRN
  Administered 2017-08-06 (×2): 10 mg via INTRAMUSCULAR
  Administered 2017-08-06: 20 mg via INTRAMUSCULAR

## 2017-08-06 MED ORDER — ACETAMINOPHEN 10 MG/ML IV SOLN
1000.0000 mg | Freq: Four times a day (QID) | INTRAVENOUS | Status: AC
  Administered 2017-08-06 – 2017-08-07 (×3): 1000 mg via INTRAVENOUS
  Filled 2017-08-06 (×5): qty 100

## 2017-08-06 MED ORDER — SODIUM CHLORIDE FLUSH 0.9 % IV SOLN
INTRAVENOUS | Status: AC
  Filled 2017-08-06: qty 40

## 2017-08-06 MED ORDER — GABAPENTIN 300 MG PO CAPS
300.0000 mg | ORAL_CAPSULE | Freq: Every day | ORAL | Status: DC
  Administered 2017-08-06 – 2017-08-08 (×3): 300 mg via ORAL
  Filled 2017-08-06 (×3): qty 1

## 2017-08-06 MED ORDER — LIDOCAINE HCL (CARDIAC) PF 100 MG/5ML IV SOSY
PREFILLED_SYRINGE | INTRAVENOUS | Status: DC | PRN
  Administered 2017-08-06: 100 mg via INTRAVENOUS

## 2017-08-06 MED ORDER — NEOMYCIN-POLYMYXIN B GU 40-200000 IR SOLN
Status: AC
  Filled 2017-08-06: qty 20

## 2017-08-06 MED ORDER — ENOXAPARIN SODIUM 30 MG/0.3ML ~~LOC~~ SOLN
30.0000 mg | Freq: Two times a day (BID) | SUBCUTANEOUS | Status: DC
  Administered 2017-08-07 – 2017-08-09 (×5): 30 mg via SUBCUTANEOUS
  Filled 2017-08-06 (×5): qty 0.3

## 2017-08-06 MED ORDER — OXYCODONE HCL 5 MG PO TABS
5.0000 mg | ORAL_TABLET | ORAL | Status: DC | PRN
  Administered 2017-08-06 – 2017-08-09 (×3): 5 mg via ORAL
  Filled 2017-08-06 (×3): qty 1

## 2017-08-06 MED ORDER — OMEGA-3-ACID ETHYL ESTERS 1 G PO CAPS
1.0000 | ORAL_CAPSULE | Freq: Every day | ORAL | Status: DC
  Administered 2017-08-07 – 2017-08-09 (×3): 1 g via ORAL
  Filled 2017-08-06 (×4): qty 1

## 2017-08-06 MED ORDER — ONDANSETRON HCL 4 MG/2ML IJ SOLN
INTRAMUSCULAR | Status: AC
  Filled 2017-08-06: qty 2

## 2017-08-06 MED ORDER — OXYBUTYNIN CHLORIDE ER 5 MG PO TB24
10.0000 mg | ORAL_TABLET | Freq: Every day | ORAL | Status: DC
  Administered 2017-08-07 – 2017-08-09 (×3): 10 mg via ORAL
  Filled 2017-08-06 (×3): qty 2

## 2017-08-06 MED ORDER — FERROUS SULFATE 325 (65 FE) MG PO TABS
325.0000 mg | ORAL_TABLET | Freq: Two times a day (BID) | ORAL | Status: DC
  Administered 2017-08-06 – 2017-08-09 (×6): 325 mg via ORAL
  Filled 2017-08-06 (×6): qty 1

## 2017-08-06 MED ORDER — FENTANYL CITRATE (PF) 100 MCG/2ML IJ SOLN
25.0000 ug | INTRAMUSCULAR | Status: DC | PRN
  Administered 2017-08-06: 25 ug via INTRAVENOUS

## 2017-08-06 MED ORDER — FOLIC ACID 1 MG PO TABS
1.0000 mg | ORAL_TABLET | Freq: Every day | ORAL | Status: DC
  Administered 2017-08-07 – 2017-08-09 (×3): 1 mg via ORAL
  Filled 2017-08-06 (×3): qty 1

## 2017-08-06 MED ORDER — NEOMYCIN-POLYMYXIN B GU 40-200000 IR SOLN
Status: DC | PRN
  Administered 2017-08-06: 14 mL

## 2017-08-06 MED ORDER — MIDAZOLAM HCL 2 MG/2ML IJ SOLN
INTRAMUSCULAR | Status: AC
  Filled 2017-08-06: qty 2

## 2017-08-06 MED ORDER — EPHEDRINE SULFATE 50 MG/ML IJ SOLN
INTRAMUSCULAR | Status: DC | PRN
  Administered 2017-08-06: 5 mg via INTRAVENOUS
  Administered 2017-08-06 (×2): 10 mg via INTRAVENOUS

## 2017-08-06 MED ORDER — ONDANSETRON HCL 4 MG/2ML IJ SOLN
INTRAMUSCULAR | Status: DC | PRN
  Administered 2017-08-06: 4 mg via INTRAVENOUS

## 2017-08-06 MED ORDER — LISINOPRIL 5 MG PO TABS
5.0000 mg | ORAL_TABLET | Freq: Every day | ORAL | Status: DC
  Administered 2017-08-06 – 2017-08-09 (×4): 5 mg via ORAL
  Filled 2017-08-06 (×4): qty 1

## 2017-08-06 MED ORDER — FENTANYL CITRATE (PF) 100 MCG/2ML IJ SOLN
INTRAMUSCULAR | Status: DC | PRN
  Administered 2017-08-06 (×2): 25 ug via INTRAVENOUS
  Administered 2017-08-06: 100 ug via INTRAVENOUS
  Administered 2017-08-06: 125 ug via INTRAVENOUS
  Administered 2017-08-06 (×3): 25 ug via INTRAVENOUS

## 2017-08-06 MED ORDER — DEXAMETHASONE SODIUM PHOSPHATE 10 MG/ML IJ SOLN
INTRAMUSCULAR | Status: AC
  Filled 2017-08-06: qty 1

## 2017-08-06 MED ORDER — POLYVINYL ALCOHOL 1.4 % OP SOLN
1.0000 [drp] | Freq: Every day | OPHTHALMIC | Status: DC | PRN
  Filled 2017-08-06: qty 15

## 2017-08-06 MED ORDER — POTASSIUM CHLORIDE CRYS ER 20 MEQ PO TBCR
20.0000 meq | EXTENDED_RELEASE_TABLET | Freq: Once | ORAL | Status: DC
  Filled 2017-08-06: qty 1

## 2017-08-06 MED ORDER — PROPOFOL 10 MG/ML IV BOLUS
INTRAVENOUS | Status: DC | PRN
  Administered 2017-08-06: 120 mg via INTRAVENOUS

## 2017-08-06 MED ORDER — TRAMADOL HCL 50 MG PO TABS: 50.0000 mg | ORAL_TABLET | ORAL | Status: DC | PRN

## 2017-08-06 MED ORDER — ONDANSETRON HCL 4 MG/2ML IJ SOLN: 4.0000 mg | Freq: Once | INTRAMUSCULAR | Status: DC | PRN

## 2017-08-06 MED ORDER — SENNOSIDES-DOCUSATE SODIUM 8.6-50 MG PO TABS
1.0000 | ORAL_TABLET | Freq: Two times a day (BID) | ORAL | Status: DC
  Administered 2017-08-06 – 2017-08-09 (×6): 1 via ORAL
  Filled 2017-08-06 (×6): qty 1

## 2017-08-06 MED ORDER — VITAMIN B-12 1000 MCG PO TABS
5000.0000 ug | ORAL_TABLET | Freq: Every day | ORAL | Status: DC
  Administered 2017-08-07 – 2017-08-09 (×3): 5000 ug via ORAL
  Filled 2017-08-06 (×3): qty 5

## 2017-08-06 MED ORDER — LABETALOL HCL 5 MG/ML IV SOLN
INTRAVENOUS | Status: AC
  Filled 2017-08-06: qty 4

## 2017-08-06 MED ORDER — VITAMIN D 1000 UNITS PO TABS
5000.0000 [IU] | ORAL_TABLET | Freq: Every day | ORAL | Status: DC
  Administered 2017-08-07 – 2017-08-09 (×3): 5000 [IU] via ORAL
  Filled 2017-08-06 (×4): qty 5

## 2017-08-06 MED ORDER — SULFASALAZINE 500 MG PO TBEC
1000.0000 mg | DELAYED_RELEASE_TABLET | Freq: Two times a day (BID) | ORAL | Status: DC
  Administered 2017-08-06 – 2017-08-09 (×6): 1000 mg via ORAL
  Filled 2017-08-06 (×9): qty 2

## 2017-08-06 MED ORDER — ROCURONIUM BROMIDE 100 MG/10ML IV SOLN
INTRAVENOUS | Status: DC | PRN
  Administered 2017-08-06: 10 mg via INTRAVENOUS
  Administered 2017-08-06: 50 mg via INTRAVENOUS
  Administered 2017-08-06: 10 mg via INTRAVENOUS

## 2017-08-06 MED ORDER — BUPIVACAINE HCL (PF) 0.25 % IJ SOLN
INTRAMUSCULAR | Status: AC
  Filled 2017-08-06: qty 60

## 2017-08-06 MED ORDER — DONEPEZIL HCL 5 MG PO TABS
5.0000 mg | ORAL_TABLET | Freq: Every day | ORAL | Status: DC
  Administered 2017-08-07 – 2017-08-09 (×3): 5 mg via ORAL
  Filled 2017-08-06 (×3): qty 1

## 2017-08-06 MED ORDER — LACTATED RINGERS IV SOLN
INTRAVENOUS | Status: DC
  Administered 2017-08-06 (×2): via INTRAVENOUS

## 2017-08-06 MED ORDER — CALCIUM CARBONATE ANTACID 500 MG PO CHEW
1250.0000 mg | CHEWABLE_TABLET | Freq: Every day | ORAL | Status: DC
  Administered 2017-08-07: 1250 mg via ORAL
  Administered 2017-08-08: 500 mg via ORAL
  Administered 2017-08-09: 1000 mg via ORAL
  Filled 2017-08-06 (×2): qty 7

## 2017-08-06 MED ORDER — BISACODYL 10 MG RE SUPP
10.0000 mg | Freq: Every day | RECTAL | Status: DC | PRN
  Filled 2017-08-06 (×2): qty 1

## 2017-08-06 MED ORDER — SUGAMMADEX SODIUM 200 MG/2ML IV SOLN
INTRAVENOUS | Status: DC | PRN
  Administered 2017-08-06: 200 mg via INTRAVENOUS

## 2017-08-06 MED ORDER — PANTOPRAZOLE SODIUM 40 MG PO TBEC
40.0000 mg | DELAYED_RELEASE_TABLET | Freq: Two times a day (BID) | ORAL | Status: DC
  Administered 2017-08-06 – 2017-08-09 (×6): 40 mg via ORAL
  Filled 2017-08-06 (×6): qty 1

## 2017-08-06 MED ORDER — CEFAZOLIN SODIUM-DEXTROSE 2-4 GM/100ML-% IV SOLN
2.0000 g | Freq: Four times a day (QID) | INTRAVENOUS | Status: AC
  Administered 2017-08-06 – 2017-08-07 (×4): 2 g via INTRAVENOUS
  Filled 2017-08-06 (×4): qty 100

## 2017-08-06 SURGICAL SUPPLY — 67 items
BATTERY INSTRU NAVIGATION (MISCELLANEOUS) ×12 IMPLANT
BLADE SAGITTAL 25.0X1.19X90 (BLADE) ×2 IMPLANT
BLADE SAGITTAL 25.0X1.19X90MM (BLADE) ×1
BLADE SAW 1/2 (BLADE) ×3 IMPLANT
BLADE SAW 70X12.5 (BLADE) IMPLANT
BONE CEMENT GENTAMICIN (Cement) ×6 IMPLANT
CANISTER SUCT 1200ML W/VALVE (MISCELLANEOUS) ×3 IMPLANT
CANISTER SUCT 3000ML PPV (MISCELLANEOUS) ×6 IMPLANT
CAPT KNEE TOTAL 3 ATTUNE ×3 IMPLANT
CEMENT BONE GENTAMICIN 40 (Cement) ×2 IMPLANT
COOLER POLAR GLACIER W/PUMP (MISCELLANEOUS) ×3 IMPLANT
CUFF TOURN 24 STER (MISCELLANEOUS) ×3 IMPLANT
CUFF TOURN 30 STER DUAL PORT (MISCELLANEOUS) IMPLANT
DRAPE SHEET LG 3/4 BI-LAMINATE (DRAPES) ×3 IMPLANT
DRSG DERMACEA 8X12 NADH (GAUZE/BANDAGES/DRESSINGS) ×3 IMPLANT
DRSG OPSITE POSTOP 4X14 (GAUZE/BANDAGES/DRESSINGS) ×3 IMPLANT
DRSG TEGADERM 4X4.75 (GAUZE/BANDAGES/DRESSINGS) ×3 IMPLANT
DURAPREP 26ML APPLICATOR (WOUND CARE) ×6 IMPLANT
ELECT CAUTERY BLADE 6.4 (BLADE) ×3 IMPLANT
ELECT REM PT RETURN 9FT ADLT (ELECTROSURGICAL) ×3
ELECTRODE REM PT RTRN 9FT ADLT (ELECTROSURGICAL) ×1 IMPLANT
EX-PIN ORTHOLOCK NAV 4X150 (PIN) ×6 IMPLANT
GLOVE BIOGEL M STRL SZ7.5 (GLOVE) ×6 IMPLANT
GLOVE BIOGEL PI IND STRL 7.5 (GLOVE) ×7 IMPLANT
GLOVE BIOGEL PI IND STRL 9 (GLOVE) ×1 IMPLANT
GLOVE BIOGEL PI INDICATOR 7.5 (GLOVE) ×14
GLOVE BIOGEL PI INDICATOR 9 (GLOVE) ×2
GLOVE INDICATOR 8.0 STRL GRN (GLOVE) ×3 IMPLANT
GLOVE SURG SYN 9.0  PF PI (GLOVE) ×2
GLOVE SURG SYN 9.0 PF PI (GLOVE) ×1 IMPLANT
GOWN STRL REUS W/ TWL LRG LVL3 (GOWN DISPOSABLE) ×3 IMPLANT
GOWN STRL REUS W/TWL 2XL LVL3 (GOWN DISPOSABLE) ×3 IMPLANT
GOWN STRL REUS W/TWL LRG LVL3 (GOWN DISPOSABLE) ×6
HEMOVAC 400CC 10FR (MISCELLANEOUS) ×3 IMPLANT
HOLDER FOLEY CATH W/STRAP (MISCELLANEOUS) ×3 IMPLANT
HOOD PEEL AWAY FLYTE STAYCOOL (MISCELLANEOUS) ×6 IMPLANT
KIT TURNOVER KIT A (KITS) ×3 IMPLANT
KNIFE SCULPS 14X20 (INSTRUMENTS) ×3 IMPLANT
LABEL OR SOLS (LABEL) ×3 IMPLANT
NDL SAFETY ECLIPSE 18X1.5 (NEEDLE) ×1 IMPLANT
NEEDLE HYPO 18GX1.5 SHARP (NEEDLE) ×2
NEEDLE SPNL 20GX3.5 QUINCKE YW (NEEDLE) ×6 IMPLANT
NS IRRIG 500ML POUR BTL (IV SOLUTION) ×3 IMPLANT
PACK TOTAL KNEE (MISCELLANEOUS) ×3 IMPLANT
PAD WRAPON POLAR KNEE (MISCELLANEOUS) ×1 IMPLANT
PIN DRILL QUICK PACK ×3 IMPLANT
PIN FIXATION 1/8DIA X 3INL (PIN) ×3 IMPLANT
PULSAVAC PLUS IRRIG FAN TIP (DISPOSABLE) ×3
SOL .9 NS 3000ML IRR  AL (IV SOLUTION) ×2
SOL .9 NS 3000ML IRR UROMATIC (IV SOLUTION) ×1 IMPLANT
SOL PREP PVP 2OZ (MISCELLANEOUS) ×3
SOLUTION PREP PVP 2OZ (MISCELLANEOUS) ×1 IMPLANT
SPONGE DRAIN TRACH 4X4 STRL 2S (GAUZE/BANDAGES/DRESSINGS) ×3 IMPLANT
STAPLER SKIN PROX 35W (STAPLE) ×3 IMPLANT
STRAP TIBIA SHORT (MISCELLANEOUS) ×3 IMPLANT
SUCTION FRAZIER HANDLE 10FR (MISCELLANEOUS) ×2
SUCTION TUBE FRAZIER 10FR DISP (MISCELLANEOUS) ×1 IMPLANT
SUT VIC AB 0 CT1 36 (SUTURE) ×3 IMPLANT
SUT VIC AB 1 CT1 36 (SUTURE) ×6 IMPLANT
SUT VIC AB 2-0 CT2 27 (SUTURE) ×3 IMPLANT
SYR 20CC LL (SYRINGE) ×3 IMPLANT
SYR 30ML LL (SYRINGE) ×6 IMPLANT
TIP FAN IRRIG PULSAVAC PLUS (DISPOSABLE) ×1 IMPLANT
TOWEL OR 17X26 4PK STRL BLUE (TOWEL DISPOSABLE) ×3 IMPLANT
TOWER CARTRIDGE SMART MIX (DISPOSABLE) ×3 IMPLANT
TRAY FOLEY MTR SLVR 16FR STAT (SET/KITS/TRAYS/PACK) ×3 IMPLANT
WRAPON POLAR PAD KNEE (MISCELLANEOUS) ×3

## 2017-08-06 NOTE — Progress Notes (Signed)
PT Cancellation Note  Patient Details Name: Sylvia Miller MRN: [id number] DOB: 1941/03/01   Cancelled Treatment:    Reason Eval/Treat Not Completed: Medical issues which prohibited therapy. Order received and chart reviewed. Attempted to see patient x 2 however she lacks full sensation and motor function during both attempts. Will perform PT evaluation on later date/time as pt is available.   Lyndel Safe Nels Munn PT, DPT   Abbigale Mcelhaney 08/06/2017, 3:22 PM

## 2017-08-06 NOTE — NC FL2 (Signed)
[place] LEVEL OF CARE SCREENING TOOL     IDENTIFICATION  Patient Name: Sylvia Miller Birthdate: 13-Dec-1940 Sex: female Admission Date (Current Location): 08/06/2017  [place] and [place] Number:  Engineering geologist and Address:  [facility], [address] [place], [place] [address]      Provider Number: [id number]  Attending Physician Name and Address:  Dereck Leep, MD  Relative Name and Phone Number:       Current Level of Care: Hospital Recommended Level of Care: [place] Prior Approval Number:    Date Approved/Denied:   PASRR Number: ([id number] )  Discharge Plan: SNF    Current Diagnoses: Patient Active Problem List   Diagnosis Date Noted  . 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 Cubital canal compression syndrome, right 04/18/2017  . Primary osteoarthritis of knee 03/25/2017  . 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 Pure hypercholesterolemia 08/17/2016  . MDS (myelodysplastic syndrome) ([place]) 05/12/2016  . B12 deficiency 05/04/2016  . Essential hypertension 05/04/2016  . Mixed stress and urge urinary incontinence 05/04/2016  . Rheumatoid arthritis involving multiple sites with positive rheumatoid factor ([place]) 05/04/2016    Orientation RESPIRATION BLADDER Height & Weight     Self, Time, Situation, Place  O2(2 Liters Oxygen. ) Continent Weight: 149 lb 14.4 oz (68 kg) Height:  4\' 9"  (144.8 cm)  BEHAVIORAL SYMPTOMS/MOOD NEUROLOGICAL BOWEL NUTRITION STATUS      Continent Diet(Diet: Clear Liquid to be Advanced. )  AMBULATORY STATUS COMMUNICATION OF NEEDS Skin   Extensive Assist Verbally Surgical wounds(Incision: Left Knee. )                       Personal Care Assistance Level of Assistance  Bathing, Feeding, Dressing Bathing Assistance: Limited assistance Feeding assistance: Independent Dressing Assistance: Limited assistance     Functional Limitations Info  Sight,  Hearing, Speech Sight Info: Adequate Hearing Info: Adequate Speech Info: Adequate    SPECIAL CARE FACTORS FREQUENCY  PT (By licensed PT), OT (By licensed OT)     PT Frequency: (5) OT Frequency: (5)            Contractures      Additional Factors Info  Code Status, Allergies Code Status Info: (Full Code. ) Allergies Info: (Pollen Extract)           Current Medications (08/06/2017):  This is the current hospital active medication list Current Facility-Administered Medications  Medication Dose Route Frequency Provider Last Rate Last Dose  . 0.9 %  sodium chloride infusion   Intravenous Continuous Hooten, Laurice Record, MD      . acetaminophen (OFIRMEV) IV 1,000 mg  1,000 mg Intravenous Q6H Hooten, Laurice Record, MD      . Derrill Memo ON 08/07/2017] acetaminophen (TYLENOL) tablet 325-650 mg  325-650 mg Oral Q6H PRN Hooten, Laurice Record, MD      . alum & mag hydroxide-simeth (MAALOX/MYLANTA) 200-200-20 MG/5ML suspension 30 mL  30 mL Oral Q4H PRN Hooten, Laurice Record, MD      . Derrill Memo ON 08/07/2017] atorvastatin (LIPITOR) tablet 5 mg  5 mg Oral Daily Hooten, Laurice Record, MD      . bisacodyl (DULCOLAX) suppository 10 mg  10 mg Rectal Daily PRN Hooten, Laurice Record, MD      . calcium carbonate (TUMS - dosed in mg elemental calcium) chewable tablet 1,250 mg  1,250 mg Oral Daily Hooten, Laurice Record, MD      . ceFAZolin (ANCEF)  IVPB 2g/100 mL premix  2 g Intravenous Q6H Hooten, Laurice Record, MD      . celecoxib (CELEBREX) 200 MG capsule           . celecoxib (CELEBREX) capsule 200 mg  200 mg Oral BID Hooten, Laurice Record, MD      . cholecalciferol (VITAMIN D) tablet 5,000 Units  5,000 Units Oral Daily Hooten, Laurice Record, MD      . dexamethasone (DECADRON) 10 MG/ML injection           . diphenhydrAMINE (BENADRYL) 12.5 MG/5ML elixir 12.5-25 mg  12.5-25 mg Oral Q4H PRN Dereck Leep, MD      . Derrill Memo ON 08/07/2017] donepezil (ARICEPT) tablet 5 mg  5 mg Oral Daily Hooten, Laurice Record, MD      . Derrill Memo ON 08/07/2017] enoxaparin (LOVENOX) injection 30  mg  30 mg Subcutaneous Q12H Hooten, Laurice Record, MD      . fentaNYL (SUBLIMAZE) 100 MCG/2ML injection           . ferrous sulfate tablet 325 mg  325 mg Oral BID WC Hooten, Laurice Record, MD      . folic acid (FOLVITE) tablet 1 mg  800 mcg Oral Daily Hooten, Laurice Record, MD      . gabapentin (NEURONTIN) 300 MG capsule           . gabapentin (NEURONTIN) capsule 300 mg  300 mg Oral QHS Hooten, Laurice Record, MD      . HYDROmorphone (DILAUDID) injection 0.5-1 mg  0.5-1 mg Intravenous Q4H PRN Hooten, Laurice Record, MD      . lisinopril (PRINIVIL,ZESTRIL) tablet 5 mg  5 mg Oral Daily Hooten, Laurice Record, MD      . magnesium hydroxide (MILK OF MAGNESIA) suspension 30 mL  30 mL Oral Daily Hooten, Laurice Record, MD      . menthol-cetylpyridinium (CEPACOL) lozenge 3 mg  1 lozenge Oral PRN Hooten, Laurice Record, MD       Or  . phenol (CHLORASEPTIC) mouth spray 1 spray  1 spray Mouth/Throat PRN Hooten, Laurice Record, MD      . metoCLOPramide (REGLAN) tablet 5-10 mg  5-10 mg Oral Q8H PRN Hooten, Laurice Record, MD       Or  . metoCLOPramide (REGLAN) injection 5-10 mg  5-10 mg Intravenous Q8H PRN Hooten, Laurice Record, MD      . metoCLOPramide (REGLAN) tablet 10 mg  10 mg Oral TID AC & HS Hooten, Laurice Record, MD      . multivitamin-lutein (OCUVITE-LUTEIN) capsule 1 capsule  1 capsule Oral Daily Hooten, Laurice Record, MD      . omega-3 acid ethyl esters (LOVAZA) capsule 1 g  1 capsule Oral Daily Hooten, Laurice Record, MD      . ondansetron (ZOFRAN) tablet 4 mg  4 mg Oral Q6H PRN Hooten, Laurice Record, MD       Or  . ondansetron (ZOFRAN) injection 4 mg  4 mg Intravenous Q6H PRN Hooten, Laurice Record, MD      . oxybutynin (DITROPAN-XL) 24 hr tablet 10 mg  10 mg Oral Daily Hooten, Laurice Record, MD      . oxyCODONE (Oxy IR/ROXICODONE) immediate release tablet 10 mg  10 mg Oral Q4H PRN Hooten, Laurice Record, MD      . oxyCODONE (Oxy IR/ROXICODONE) immediate release tablet 5 mg  5 mg Oral Q4H PRN Hooten, Laurice Record, MD      . pantoprazole (PROTONIX) EC tablet 40 mg  40 mg Oral  BID Hooten, Laurice Record, MD      .  polyvinyl alcohol (LIQUIFILM TEARS) 1.4 % ophthalmic solution 1 drop  1 drop Both Eyes Daily PRN Hooten, Laurice Record, MD      . potassium chloride SA (K-DUR,KLOR-CON) CR tablet 20 mEq  20 mEq Oral Once Hooten, Laurice Record, MD      . senna-docusate (Senokot-S) tablet 1 tablet  1 tablet Oral BID Hooten, Laurice Record, MD      . sodium phosphate (FLEET) 7-19 GM/118ML enema 1 enema  1 enema Rectal Once PRN Hooten, Laurice Record, MD      . sulfaSALAzine (AZULFIDINE) EC tablet 1,000 mg  1,000 mg Oral BID Hooten, Laurice Record, MD      . traMADol Veatrice Bourbon) tablet 50-100 mg  50-100 mg Oral Q4H PRN Hooten, Laurice Record, MD      . vitamin B-12 (CYANOCOBALAMIN) tablet 5,000 mcg  5,000 mcg Oral Daily Hooten, Laurice Record, MD         Discharge Medications: Please see discharge summary for a list of discharge medications.  Relevant Imaging Results:  Relevant Lab Results:   Additional Information (SSN: [id number])  Una Yeomans, Veronia Beets, LCSW

## 2017-08-06 NOTE — Transfer of Care (Signed)
Immediate Anesthesia Transfer of Care Note  Patient: Sylvia Miller  Procedure(s) Performed: COMPUTER ASSISTED TOTAL KNEE ARTHROPLASTY (Left Knee)  Patient Location: PACU  Anesthesia Type:General  Level of Consciousness: awake, alert , oriented and patient cooperative  Airway & Oxygen Therapy: Patient Spontanous Breathing and Patient connected to nasal cannula oxygen  Post-op Assessment: Report given to RN and Post -op Vital signs reviewed and stable  Post vital signs: Reviewed and stable  Last Vitals:  Vitals Value Taken Time  BP    Temp    Pulse 73 08/06/2017 11:04 AM  Resp 13 08/06/2017 11:04 AM  SpO2 99 % 08/06/2017 11:04 AM  Vitals shown include unvalidated device data.  Last Pain:  Vitals:   08/06/17 0609  TempSrc: Tympanic  PainSc: 6          Complications: No apparent anesthesia complications

## 2017-08-06 NOTE — Discharge Instructions (Signed)
°  Instructions after Total Knee Replacement ° ° [place] Catlin P. Frantz Quattrone, Jr., M.D.    ° Dept. of Orthopaedics & Sports Medicine ° [facility] ° [address] ° [place], [place]  [address] ° Phone: [phone]   Fax: [phone] ° °  °DIET: °• Drink plenty of non-alcoholic fluids. °• Resume your normal diet. Include foods high in fiber. ° °ACTIVITY:  °• You may use crutches or a walker with weight-bearing as tolerated, unless instructed otherwise. °• You may be weaned off of the walker or crutches by your Physical Therapist.  °• Do NOT place pillows under the knee. Anything placed under the knee could limit your ability to straighten the knee.   °• Continue doing gentle exercises. Exercising will reduce the pain and swelling, increase motion, and prevent muscle weakness.   °• Please continue to use the TED compression stockings for 6 weeks. You may remove the stockings at night, but should reapply them in the morning. °• Do not drive or operate any equipment until instructed. ° °WOUND CARE:  °• Continue to use the PolarCare or ice packs periodically to reduce pain and swelling. °• You may bathe or shower after the staples are removed at the first office visit following surgery. ° °MEDICATIONS: °• You may resume your regular medications. °• Please take the pain medication as prescribed on the medication. °• Do not take pain medication on an empty stomach. °• You have been given a prescription for a blood thinner (Lovenox or Coumadin). Please take the medication as instructed. (NOTE: After completing a 2 week course of Lovenox, take one Enteric-coated aspirin once a day. This along with elevation will help reduce the possibility of phlebitis in your operated leg.) °• Do not drive or drink alcoholic beverages when taking pain medications. ° °CALL THE OFFICE FOR: °• Temperature above 101 degrees °• Excessive bleeding or drainage on the dressing. °• Excessive swelling, coldness, or paleness of the toes. °• Persistent  nausea and vomiting. ° °FOLLOW-UP:  °• You should have an appointment to return to the office in 10-14 days after surgery. °• Arrangements have been made for continuation of Physical Therapy (either home therapy or outpatient therapy). °  °

## 2017-08-06 NOTE — Anesthesia Post-op Follow-up Note (Signed)
Anesthesia QCDR form completed.        

## 2017-08-06 NOTE — Anesthesia Procedure Notes (Signed)
Procedure Name: Intubation Date/Time: 08/06/2017 7:25 AM Performed by: Bernardo Heater, CRNA Pre-anesthesia Checklist: Patient identified, Emergency Drugs available, Suction available and Patient being monitored Patient Re-evaluated:Patient Re-evaluated prior to induction Oxygen Delivery Method: Circle system utilized Preoxygenation: Pre-oxygenation with 100% oxygen Induction Type: IV induction Laryngoscope Size: Mac and 3 Grade View: Grade I Tube size: 6.5 mm Number of attempts: 1 Placement Confirmation: ETT inserted through vocal cords under direct vision,  positive ETCO2 and breath sounds checked- equal and bilateral Secured at: 20 cm Tube secured with: Tape Dental Injury: Teeth and Oropharynx as per pre-operative assessment

## 2017-08-06 NOTE — Progress Notes (Addendum)
Physical Therapy Evaluation Patient Details Name: Sylvia Miller MRN: [id number] DOB: [birth year] Today's Date: 08/06/2017   History of Present Illness  Pt admitted for L TKR without reported post-op complications prior to arrival on the floor. PMH includes PVD, hyperlipidemia, and RA among other comorbid conditions. PT evaluation performed on POD#0.  Clinical Impression  Pt admitted with above diagnosis. Pt currently with functional limitations due to the deficits listed below (see PT Problem List).  Attempted to see patient twice however she lacks full sensation and motor function during both attempts. PT evaluation initially held however therapist did not realize until after speaking with MD that pt did not have a spinal and underwent general anesthesia. MD concerned about possible peroneal nerve compression given degree of valgus prior to surgery. PT returned to evaluate patient. She continues to lack dorsiflexion AROM and strength however with repeated dorsiflexion AROM and strength improve. She does report some posterior L knee pain however in straight knee end range AROM dorsiflexion likely due to gastrocnemius stretch. Pt is unable to perform SLR or LAQ without assistance. Pt continues to report "weird" sensation in L foot. Sensation testing performed and pt reports "tingling" and diminished sensation to L4-S1 dermatome of L foot. R foot sensation intact. The rest of her lower leg is unable to be tested due to dressings. MD notified of patient's complaints of altered sensation. She requires minA+1 for bed mobility and transfers with knee immobilizer. ModA+1 required for short ambulation from bed to recliner. Per MD instructions towel roll not placed under L heel to avoid excessive nerve tension. Will continue to follow sensation/strength throughout the duration of her stay. At this time recommend SNF placement due to degree of assistance required with very limited ambulation. Pt will benefit from PT  services to address deficits in strength, balance, and mobility in order to return to full function at home.       Follow Up Recommendations SNF    Equipment Recommendations  None recommended by PT    Recommendations for Other Services OT consult     Precautions / Restrictions Precautions Precautions: Fall;Knee Precaution Booklet Issued: Yes (comment) Required Braces or Orthoses: Knee Immobilizer - Left Knee Immobilizer - Left: Discontinue once straight leg raise with < 10 degree lag Restrictions Weight Bearing Restrictions: Yes LLE Weight Bearing: Weight bearing as tolerated      Mobility  Bed Mobility Overal bed mobility: Needs Assistance Bed Mobility: Supine to Sit     Supine to sit: Min assist     General bed mobility comments: Pt requires cues for sequencing and safe hand placement. Requires bed rails to get to EOB and overhead trapeze to scoot up toward [facility]  Transfers Overall transfer level: Needs assistance Equipment used: Rolling walker (2 wheeled) Transfers: Sit to/from Stand Sit to Stand: Min assist         General transfer comment: Pt requires cues for hand placement and sequencing. MinA+1 required to come upright to standing  Ambulation/Gait Ambulation/Gait assistance: Mod assist Ambulation Distance (Feet): 3 Feet Assistive device: Rolling walker (2 wheeled) Gait Pattern/deviations: Decreased step length - right;Decreased step length - left;Step-to pattern Gait velocity: Below functional household limit   General Gait Details: Pt educated about proper sequencing with rolling walker. Knee immobilizer donned for transfers and ambulation. Pt demonstrates poor balance requiring min to modA+1 to stabilize. SaO2 and HR WNL during ambulation  Stairs            Wheelchair Mobility    Modified Rankin (Stroke Patients  Only)       Balance Overall balance assessment: Needs assistance Sitting-balance support: No upper extremity supported Sitting  balance-Leahy Scale: Fair     Standing balance support: Bilateral upper extremity supported Standing balance-Leahy Scale: Poor Standing balance comment: Requires UE support on walker for standing balance                             Pertinent Vitals/Pain Pain Assessment: 0-10 Pain Score: 7  Pain Location: L knee. Pt reports varying levels of pain throughout session Pain Descriptors / Indicators: Operative site guarding Pain Intervention(s): Monitored during session    Home Living Family/patient expects to be discharged to:: Skilled nursing facility Living Arrangements: Spouse/significant other Available Help at Discharge: Family Type of Home: House Home Access: Level entry     Home Layout: Multi-level;Able to live on main level with bedroom/bathroom Home Equipment: Walker - 4 wheels;Shower seat;Grab bars - toilet;Walker - 2 wheels;Toilet riser      Prior Function Level of Independence: Needs assistance   Gait / Transfers Assistance Needed: Pt ambulates with rollator vs single point cane. She denies falls in the last 12 months  ADL's / Homemaking Assistance Needed: Reports independence with ADLs but assist from husband with IADLs        Hand Dominance   Dominant Hand: Right    Extremity/Trunk Assessment   Upper Extremity Assessment Upper Extremity Assessment: Defer to OT evaluation    Lower Extremity Assessment Lower Extremity Assessment: LLE deficits/detail LLE Deficits / Details: Pt is unable to perform SLR or LAQ without assistance. Initially she is unable to fully dorsiflex L ankle or accept challenge. Pt encouraged to perform repeated ankle pumps and after 15-20 she is able to dorsiflex almost fully. She still struggles intermittently to take challenge but eventually is able to demonstrate adequate strength. She complains of posterior L knee pain with full dorsiflexion. Pt reports "weird" sensation in L foot. Sensation testing performed and pt reports  "tingling" and diminished sensation to L4-S1 dermatome of L foot. R foot sensation intact. The rest of her lower leg is unable to be tested due to dressings. MD notified of patient's complaints of altered sensation       Communication   Communication: Other (comment);No difficulties(Mild slurring of speech, confirmed by husband)  Cognition Arousal/Alertness: Awake/alert Behavior During Therapy: WFL for tasks assessed/performed Overall Cognitive Status: Within Functional Limits for tasks assessed                                 General Comments: Pt is AOx3, able to name correct year but struggles to get correct month. Oriented to situation      General Comments      Exercises Total Joint Exercises Ankle Circles/Pumps: Both;10 reps;Supine Quad Sets: Both;10 reps;Supine Gluteal Sets: Both;10 reps;Supine Hip ABduction/ADduction: Left;10 reps;Supine Straight Leg Raises: Left;10 reps;Supine Goniometric ROM: 0-70 degrees AAROM, pain limited   Assessment/Plan    PT Assessment Patient needs continued PT services  PT Problem List Decreased strength;Decreased range of motion;Decreased activity tolerance;Decreased balance;Decreased mobility       PT Treatment Interventions DME instruction;Gait training;Stair training;Functional mobility training;Therapeutic activities;Therapeutic exercise;Balance training;Neuromuscular re-education;Patient/family education;Manual techniques    PT Goals (Current goals can be found in the Care Plan section)  Acute Rehab PT Goals Patient Stated Goal: Improved function with less pain at home PT Goal Formulation: With patient/family Time For Goal  Achievement: 08/20/17 Potential to Achieve Goals: Good    Frequency BID   Barriers to discharge        Co-evaluation               AM-PAC PT "6 Clicks" Daily Activity  Outcome Measure Difficulty turning over in bed (including adjusting bedclothes, sheets and blankets)?: A  Lot Difficulty moving from lying on back to sitting on the side of the bed? : Unable Difficulty sitting down on and standing up from a chair with arms (e.g., wheelchair, bedside commode, etc,.)?: Unable Help needed moving to and from a bed to chair (including a wheelchair)?: Total Help needed walking in hospital room?: A Lot Help needed climbing 3-5 steps with a railing? : Total 6 Click Score: 8    End of Session Equipment Utilized During Treatment: Gait belt Activity Tolerance: Patient tolerated treatment well Patient left: with call bell/phone within reach;in chair;with chair alarm set;with family/visitor present;with SCD's reapplied;Other (comment)(no towel roll under heel per MD, polar care in place) Nurse Communication: Other (comment)(Spoke with MD regarding altered sensation in L ankle) PT Visit Diagnosis: Unsteadiness on feet (R26.81);Muscle weakness (generalized) (M62.81);Difficulty in walking, not elsewhere classified (R26.2)    Time: 7408-1448 PT Time Calculation (min) (ACUTE ONLY): 41 min   Charges:   PT Evaluation $PT Eval Low Complexity: 1 Low PT Treatments $Therapeutic Exercise: 8-22 mins   PT G Codes:        Lyndel Safe Huprich PT, DPT    Huprich,Jason 08/06/2017, 5:32 PM

## 2017-08-06 NOTE — Op Note (Signed)
OPERATIVE NOTE  DATE OF SURGERY:  08/06/2017  PATIENT NAME:  Sylvia Miller   DOB: 1970-08-28  MRN: [id number]  PRE-OPERATIVE DIAGNOSIS: Degenerative arthrosis of the left knee, primary  POST-OPERATIVE DIAGNOSIS:  Same  PROCEDURE:  Left total knee arthroplasty using computer-assisted navigation  SURGEON:  Marciano Sequin. M.D.  ANESTHESIA: general  ESTIMATED BLOOD LOSS: 50 mL  FLUIDS REPLACED: 1300 mL of crystalloid  TOURNIQUET TIME: 114 minutes  DRAINS: 2 medium Hemovac drains  SOFT TISSUE RELEASES: Anterior cruciate ligament, posterior cruciate ligament, deep medial collateral ligament, patellofemoral ligament, posterolateral corner  IMPLANTS UTILIZED: DePuy Attune size 3 posterior stabilized femoral component (cemented), size 3 rotating platform tibial component (cemented), 32 mm medialized dome patella (cemented), and a 12 mm stabilized rotating platform polyethylene insert.  INDICATIONS FOR SURGERY: Sylvia Miller is a 77 y.o. year old female with a long history of progressive knee pain. X-rays demonstrated severe degenerative changes in tricompartmental fashion. The patient had not seen any significant improvement despite conservative nonsurgical intervention. After discussion of the risks and benefits of surgical intervention, the patient expressed understanding of the risks benefits and agree with plans for total knee arthroplasty.   The risks, benefits, and alternatives were discussed at length including but not limited to the risks of infection, bleeding, nerve injury, stiffness, blood clots, the need for revision surgery, cardiopulmonary complications, among others, and they were willing to proceed.  PROCEDURE IN DETAIL: The patient was brought into the operating room and, after adequate general anesthesia was achieved, a tourniquet was placed on the patient's upper thigh. The patient's knee and leg were cleaned and prepped with alcohol and DuraPrep and draped in the usual  sterile fashion. A "timeout" was performed as per usual protocol. The lower extremity was exsanguinated using an Esmarch, and the tourniquet was inflated to 300 mmHg. An anterior longitudinal incision was made followed by a standard mid vastus approach. The deep fibers of the medial collateral ligament were elevated in a subperiosteal fashion off of the medial flare of the tibia so as to maintain a continuous soft tissue sleeve. The patella was subluxed laterally and the patellofemoral ligament was incised. Inspection of the knee demonstrated severe degenerative changes with full-thickness loss of articular cartilage. Osteophytes were debrided using a rongeur. Anterior and posterior cruciate ligaments were excised. Two 4.0 mm Schanz pins were inserted in the femur and into the tibia for attachment of the array of trackers used for computer-assisted navigation. Hip center was identified using a circumduction technique. Distal landmarks were mapped using the computer. The distal femur and proximal tibia were mapped using the computer. The distal femoral cutting guide was positioned using computer-assisted navigation so as to achieve a 5 distal valgus cut. The femur was sized and it was felt that a size 3 femoral component was appropriate. A size 3 femoral cutting guide was positioned and the anterior cut was performed and verified using the computer. This was followed by completion of the posterior and chamfer cuts. Femoral cutting guide for the central box was then positioned in the center box cut was performed.  Attention was then directed to the proximal tibia. Medial and lateral menisci were excised. The extramedullary tibial cutting guide was positioned using computer-assisted navigation so as to achieve a 0 varus-valgus alignment and 3 posterior slope. The cut was performed and verified using the computer.  There was a cystic area with what appeared to be residual nonabsorbable sutures under the  anteromedial aspect of the plateau.  The lesion  was curettaged and the apparent suture material was removed.  The lesion was reasonably well contained although there was a small area of communication with the anterior cortex.  The proximal tibia was sized and it was felt that a size 3 tibial tray was appropriate. Tibial and femoral trials were inserted followed by insertion of a 10 mm polyethylene insert.  The knee was felt to be tight laterally.  The trial components removed and the knee was brought into extension and distracted using the [place] retractors.  The posterolateral corner was carefully released using a combination of electrocautery and Metzenbaum scissors.  Trial components were reinserted with placement of a 12 mm polyethylene trial.  This allowed for excellent mediolateral soft tissue balancing both in flexion and in full extension. Finally, the patella was cut and prepared so as to accommodate a 32 mm medialized dome patella. A patella trial was placed and the knee was placed through a range of motion with excellent patellar tracking appreciated. The femoral trial was removed after debridement of posterior osteophytes. The central post-hole for the tibial component was reamed followed by insertion of a keel punch. Tibial trials were then removed. Cut surfaces of bone were irrigated with copious amounts of normal saline with antibiotic solution using pulsatile lavage and then suctioned dry. Polymethylmethacrylate cement with gentamicin was prepared in the usual fashion using a vacuum mixer. Cement was applied to the cut surface of the proximal tibia as well as along the undersurface of a size 3 rotating platform tibial component. Tibial component was positioned and impacted into place. Excess cement was removed using [employer] fast streamer. Cement was then applied to the cut surfaces of the femur as well as along the posterior flanges of the size 3 femoral component. The femoral component was positioned  and impacted into place. Excess cement was removed using [employer] fast streamer. A 12 mm polyethylene trial was inserted and the knee was brought into full extension with steady axial compression applied. Finally, cement was applied to the backside of a 32 mm medialized dome patella and the patellar component was positioned and patellar clamp applied. Excess cement was removed using [employer] fast streamer. After adequate curing of the cement, the tourniquet was deflated after a total tourniquet time of 112 minutes. Hemostasis was achieved using electrocautery. The knee was irrigated with copious amounts of normal saline with antibiotic solution using pulsatile lavage and then suctioned dry. 20 mL of 1.3% Exparel and 60 mL of 0.25% Marcaine in 40 mL of normal saline was injected along the posterior capsule, medial and lateral gutters, and along the arthrotomy site. A 12 mm stabilized rotating platform polyethylene insert was inserted and the knee was placed through a range of motion with excellent mediolateral soft tissue balancing appreciated and excellent patellar tracking noted. 2 medium drains were placed in the wound bed and brought out through separate stab incisions. The medial parapatellar portion of the incision was reapproximated using interrupted sutures of #1 Vicryl. Subcutaneous tissue was approximated in layers using first #0 Vicryl followed #2-0 Vicryl. The skin was approximated with skin staples. A sterile dressing was applied.  The patient tolerated the procedure well and was transported to the recovery room in stable condition.    James P. Holley Bouche., M.D.

## 2017-08-06 NOTE — Progress Notes (Signed)
ADMISSION NOTE:  Pt arrived to room 156 from PACU. Pt alert and oriented. Surgical dressing clean dry and intact. Hemovac in place. Polar care, teds, footpumps on. Bed in lowest position call bell in reach and bed alarm on. Family at bedside.

## 2017-08-06 NOTE — Anesthesia Preprocedure Evaluation (Signed)
Anesthesia Evaluation  Patient identified by MRN, date of birth, ID band Patient awake    Reviewed: Allergy & Precautions, H&P , NPO status , Patient's Chart, lab work & pertinent test results, reviewed documented beta blocker date and time   Airway Mallampati: II   Neck ROM: full    Dental  (+) Poor Dentition   Pulmonary neg pulmonary ROS, shortness of breath and with exertion,    Pulmonary exam normal        Cardiovascular Exercise Tolerance: Poor hypertension, On Medications + Peripheral Vascular Disease  negative cardio ROS Normal cardiovascular exam+ Valvular Problems/Murmurs  Rhythm:regular Rate:Normal     Neuro/Psych  Neuromuscular disease negative neurological ROS  negative psych ROS   GI/Hepatic negative GI ROS, Neg liver ROS, PUD, GERD  Medicated,  Endo/Other  negative endocrine ROS  Renal/GU negative Renal ROS  negative genitourinary   Musculoskeletal  (+) Arthritis ,   Abdominal   Peds  Hematology negative hematology ROS (+) anemia ,   Anesthesia Other Findings Past Medical History: No date: Allergy No date: Anemia No date: Arthritis     Comment:  rhematoid /knees/hands/shoulders (Right shoulder)  No date: Bladder incontinence No date: Chicken pox No date: Dyspnea No date: Gastric ulcer No date: GERD (gastroesophageal reflux disease) No date: Heart murmur No date: Hyperlipidemia, unspecified No date: Hypertension     Comment:  controlled with meds;  2000: 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 No date: Myelodysplastic syndrome ([place]) No date: Myelodysplastic syndrome ([place]) No date: Rheumatoid arthritis ([place]) No date: Ulcer Past Surgical History: No date: ABDOMINAL HYSTERECTOMY No date: CHOLECYSTECTOMY No date: COLONOSCOPY 02/21/2017: KNEE ARTHROPLASTY; Right     Comment:  Procedure: COMPUTER ASSISTED TOTAL KNEE ARTHROPLASTY;                Surgeon: Dereck Leep, MD;  Location: [facility] ORS;                Service: Orthopedics;  Laterality: Right; No date: TONSILLECTOMY No date: UPPER GASTROINTESTINAL ENDOSCOPY No date: WISDOM TOOTH EXTRACTION BMI    Body Mass Index:  31.16 kg/m     Reproductive/Obstetrics negative OB ROS                             Anesthesia Physical  Anesthesia Plan  ASA: III  Anesthesia Plan: General   Post-op Pain Management:    Induction:   PONV Risk Score and Plan:   Airway Management Planned:   Additional Equipment:   Intra-op Plan:   Post-operative Plan:   Informed Consent: I have reviewed the patients History and Physical, chart, labs and discussed the procedure including the risks, benefits and alternatives for the proposed anesthesia with the patient or authorized representative who has indicated his/her understanding and acceptance.   Dental Advisory Given  Plan Discussed with: CRNA  Anesthesia Plan Comments:         Anesthesia Quick Evaluation

## 2017-08-06 NOTE — H&P (Signed)
The patient has been re-examined, and the chart reviewed, and there have been no interval changes to the documented history and physical.    The risks, benefits, and alternatives have been discussed at length. The patient expressed understanding of the risks benefits and agreed with plans for surgical intervention.  James P. Hooten, Jr. M.D.    

## 2017-08-07 ENCOUNTER — Encounter
Admission: RE | Admit: 2017-08-07 | Discharge: 2017-08-07 | Disposition: A | Discharge status: Home / Self Care | Payer: Medicare Other | Source: Ambulatory Visit | Attending: Internal Medicine | Admitting: Internal Medicine

## 2017-08-07 LAB — BASIC METABOLIC PANEL
Anion gap: 7 (ref 5–15)
BUN: 11 mg/dL (ref 6–20)
CO2: 21 mmol/L — AB (ref 22–32)
CREATININE: 0.9 mg/dL (ref 0.44–1.00)
Calcium: 8.2 mg/dL — ABNORMAL LOW (ref 8.9–10.3)
Chloride: 112 mmol/L — ABNORMAL HIGH (ref 101–111)
GFR calc Af Amer: >60 mL/min (ref >60)
GFR calc non Af Amer: >60 mL/min (ref >60)
GLUCOSE: 102 mg/dL — AB (ref 65–99)
Potassium: 3.8 mmol/L (ref 3.5–5.1)
Sodium: 140 mmol/L (ref 135–145)

## 2017-08-07 LAB — CBC
HCT: 26.7 % — ABNORMAL LOW (ref 35.0–47.0)
Hemoglobin: 9.2 g/dL — ABNORMAL LOW (ref 12.0–16.0)
MCH: 33.2 pg (ref 26.0–34.0)
MCHC: 34.3 g/dL (ref 32.0–36.0)
MCV: 96.8 fL (ref 80.0–100.0)
Platelets: 128 10*3/uL — ABNORMAL LOW (ref 150–440)
RBC: 2.76 MIL/uL — AB (ref 3.80–5.20)
RDW: 14.8 % — ABNORMAL HIGH (ref 11.5–14.5)
WBC: 5.2 10*3/uL (ref 3.6–11.0)

## 2017-08-07 NOTE — Progress Notes (Signed)
Physical Therapy Treatment Patient Details Name: Sylvia Miller MRN: [id number] DOB: 05-Nov-1940 Today's Date: 08/07/2017    History of Present Illness Pt admitted for L TKR without reported post-op complications prior to arrival on the floor. PMH includes PVD, hyperlipidemia, and RA among other comorbid conditions. PT evaluation performed on POD#0. OT evaluation performed on POD#1.    PT Comments    Pt making good progress with therapy on this date. She demonstrates improved L ankle dorsiflexion strength. Sensation testing to LLE appears intact however she is somewhat inconsistent with her responses. She is able to transfer and ambulate today with CGA only. She is able to ambulate to door and back to recliner however moves very slowly. It takes her 32.0 seconds to ambulate 10 feet indicating a gait speed of 0.31 ft/sec. Step-to pattern with narrow base of support. She struggles with turning but is able to perform without assist from therapist. Notable fatigue at end of ambulation. She is able to complete all supine and seated exercises as instructed. At this time recommendation remains SNF placement at discharge. Pt will benefit from PT services to address deficits in strength, balance, and mobility in order to return to full function at home.   Follow Up Recommendations  SNF     Equipment Recommendations  None recommended by PT    Recommendations for Other Services OT consult     Precautions / Restrictions Precautions Precautions: Fall;Knee Precaution Booklet Issued: Yes (comment) Required Braces or Orthoses: Knee Immobilizer - Left Knee Immobilizer - Left: Discontinue once straight leg raise with < 10 degree lag(Pt able to perform SLR x10, no lag; no KI utilized) Restrictions Weight Bearing Restrictions: Yes LLE Weight Bearing: Weight bearing as tolerated    Mobility  Bed Mobility               General bed mobility comments: Pt received and left upright in recliner. Bed  mobility not assessed at this time  Transfers Overall transfer level: Needs assistance Equipment used: Rolling walker (2 wheeled) Transfers: Sit to/from Stand Sit to Stand: Min guard         General transfer comment: Extended time to come to standing with heavy reliance on UE for transfers. Once upright she is steady in standing with bilateral UE support on walker  Ambulation/Gait Ambulation/Gait assistance: Min guard Ambulation Distance (Feet): 40 Feet Assistive device: Rolling walker (2 wheeled) Gait Pattern/deviations: Decreased step length - right;Decreased step length - left;Step-to pattern Gait velocity: Below functional household limit   General Gait Details: Pt is able to ambulate to door and back to recliner however moves very slowly. It takes her 32.0 seconds to ambulate 10 feet. Step-to pattern with narrow base of support. She struggles with turning but is able to perform without assist from therapist. Notable fatigue at end of ambulation   Stairs             Wheelchair Mobility    Modified Rankin (Stroke Patients Only)       Balance Overall balance assessment: Needs assistance Sitting-balance support: No upper extremity supported Sitting balance-Leahy Scale: Good     Standing balance support: Bilateral upper extremity supported Standing balance-Leahy Scale: Fair Standing balance comment: Requires UE support on walker for standing balance                            Cognition Arousal/Alertness: Awake/alert Behavior During Therapy: WFL for tasks assessed/performed Overall Cognitive Status: Within Functional Limits for tasks  assessed                                        Exercises Total Joint Exercises Ankle Circles/Pumps: Both;10 reps;Supine;Other (comment)(Heel raises x 10 bilaterally as well) Quad Sets: Both;10 reps;Supine Gluteal Sets: Both;10 reps;Supine Towel Squeeze: Both;10 reps;Seated Hip ABduction/ADduction:  Both;10 reps;Seated Straight Leg Raises: Left;10 reps;Supine Long Arc Quad: Left;10 reps;Seated Knee Flexion: Left;10 reps;Seated Goniometric ROM: 0-78 degrees AAROM, pain limited in flexion Marching in Standing: Both;10 reps;Seated Other Exercises Other Exercises: Pt/spouse instructed on falls prevention strategies Other Exercises: Pt/spouse instructed in polar care mgt     General Comments        Pertinent Vitals/Pain Pain Assessment: No/denies pain Pain Score: 6  Pain Location: Pt denies L knee pain currently at rest or during ambulation Pain Descriptors / Indicators: Operative site guarding;Aching Pain Intervention(s): Limited activity within patient's tolerance;Monitored during session;RN gave pain meds during session;Ice applied;Repositioned    Home Living Family/patient expects to be discharged to:: Skilled nursing facility Living Arrangements: Spouse/significant other Available Help at Discharge: Family;Available 24 hours/day Type of Home: House Home Access: Level entry   Home Layout: Multi-level;Able to live on main level with bedroom/bathroom Home Equipment: Walker - 4 wheels;Shower seat;Grab bars - toilet;Walker - 2 wheels;Hand held shower head;Adaptive equipment      Prior Function Level of Independence: Needs assistance  Gait / Transfers Assistance Needed: Pt ambulates with rollator vs single point cane. She denies falls in the last 12 months ADL's / Homemaking Assistance Needed: Reports independence with ADLs but assist from husband with IADLs 2:2 difficulty standing for any length of time. Typically sits to brush teeth, shower, etc. Spouse does most of the driving.     PT Goals (current goals can now be found in the care plan section) Acute Rehab PT Goals Patient Stated Goal: return to PLOF with less knee pain PT Goal Formulation: With patient/family Time For Goal Achievement: 08/20/17 Potential to Achieve Goals: Good Progress towards PT goals: Progressing  toward goals    Frequency    BID      PT Plan Current plan remains appropriate    Co-evaluation              AM-PAC PT "6 Clicks" Daily Activity  Outcome Measure  Difficulty turning over in bed (including adjusting bedclothes, sheets and blankets)?: A Lot Difficulty moving from lying on back to sitting on the side of the bed? : Unable Difficulty sitting down on and standing up from a chair with arms (e.g., wheelchair, bedside commode, etc,.)?: Unable Help needed moving to and from a bed to chair (including a wheelchair)?: A Little Help needed walking in hospital room?: A Little Help needed climbing 3-5 steps with a railing? : Total 6 Click Score: 11    End of Session Equipment Utilized During Treatment: Gait belt Activity Tolerance: Patient tolerated treatment well Patient left: with call bell/phone within reach;in chair;with chair alarm set;with family/visitor present;with SCD's reapplied;Other (comment)(no towel roll under heel per MD, polar care in place)   PT Visit Diagnosis: Unsteadiness on feet (R26.81);Muscle weakness (generalized) (M62.81);Difficulty in walking, not elsewhere classified (R26.2)     Time: 7902-4097 PT Time Calculation (min) (ACUTE ONLY): 24 min  Charges:  $Gait Training: 8-22 mins $Therapeutic Exercise: 8-22 mins                    G Codes:  Lyndel Safe Huprich PT, DPT     Huprich,Jason 08/07/2017, 10:46 AM

## 2017-08-07 NOTE — Clinical Social Work Placement (Signed)
   CLINICAL SOCIAL WORK PLACEMENT  NOTE  Date:  08/07/2017  Patient Details  Name: Sylvia Miller MRN: [id number] Date of Birth: Jan 03, 1941  Clinical Social Work is seeking post-discharge placement for this patient at the [place] level of care (*CSW will initial, date and re-position this form in  chart as items are completed):  Yes   Patient/family provided with [facility] Department's list of facilities offering this level of care within the geographic area requested by the patient (or if unable, by the patient's family).  Yes   Patient/family informed of their freedom to choose among providers that offer the needed level of care, that participate in Medicare, Medicaid or managed care program needed by the patient, have an available bed and are willing to accept the patient.  Yes   Patient/family informed of [facility]'s ownership interest in [facility] and [facility], as well as of the fact that they are under no obligation to receive care at these facilities.  PASRR submitted to EDS on       PASRR number received on       Existing PASRR number confirmed on 08/07/17     FL2 transmitted to all facilities in geographic area requested by pt/family on 08/07/17     FL2 transmitted to all facilities within larger geographic area on       Patient informed that his/her managed care company has contracts with or will negotiate with certain facilities, including the following:        Yes   Patient/family informed of bed offers received.  Patient chooses bed at [facility] )     Physician recommends and patient chooses bed at      Patient to be transferred to   on  .  Patient to be transferred to facility by       Patient family notified on   of transfer.  Name of family member notified:        PHYSICIAN       Additional Comment:    _______________________________________________ Nyeisha Goodall, Veronia Beets, LCSW 08/07/2017, 12:24  PM

## 2017-08-07 NOTE — Evaluation (Signed)
Occupational Therapy Evaluation Patient Details Name: Sylvia Miller MRN: [id number] DOB: September 27, 1940 Today's Date: 08/07/2017    History of Present Illness Pt admitted for L TKR without reported post-op complications prior to arrival on the floor. PMH includes PVD, hyperlipidemia, and RA among other comorbid conditions. PT evaluation performed on POD#0. OT evaluation performed on POD#1.   Clinical Impression   Pt seen for OT evaluation this date, POD#1 from above surgery. Pt was independent in all ADLs prior to surgery, and had assist from her spouse for most of the driving and IADL including med mgt. Pt was using a rollator or SPC for mobility due to L knee pain and decreased ability to stand for any length of time. Pt is eager to return to PLOF with less pain and improved safety and independence. Pt demonstrates deficits in pain, decreased L knee ROM, impaired balance, and impaired activity tolerance. Pt with intact sensation to LLE this date with testing and able to perform SLR x10 with no lag or assist required, therefore, KI not used this date. Pt currently requires minimal assist for LB dressing and bathing while in seated position due to pain and limited AROM of L knee. Pt/spouse instructed in polar care mgt, falls prevention strategies, home/routines modifications, DME/AE for LB bathing and dressing tasks, and compression stocking mgt. Pt would benefit from skilled OT services including additional instruction in dressing techniques with or without assistive devices for dressing and bathing skills to support recall and carryover prior to discharge and ultimately to maximize safety, independence, and minimize falls risk and caregiver burden. Recommend STR at this time. Pt/spouse report they are comfortable with STR as she went to rehab after R TKA in December. Will continue to assess for appropriateness of HHOT services as pt progresses towards goals.      Follow Up Recommendations  SNF     Equipment Recommendations  3 in 1 bedside commode    Recommendations for Other Services       Precautions / Restrictions Precautions Precautions: Fall;Knee Required Braces or Orthoses: Knee Immobilizer - Left Knee Immobilizer - Left: Discontinue once straight leg raise with < 10 degree lag(Pt able to perform SLR x10, no lag; no KI utilized) Restrictions Weight Bearing Restrictions: Yes LLE Weight Bearing: Weight bearing as tolerated      Mobility Bed Mobility               General bed mobility comments: deferred, pt up in recliner at start of session, reports ambulating to bathroom and to recliner w/ staff this am  Transfers Overall transfer level: Needs assistance Equipment used: Rolling walker (2 wheeled) Transfers: Sit to/from Stand Sit to Stand: Min assist;Min guard         General transfer comment: initial Min A for transfer with verbal cues/instruction for hand placement to improve COG over BOS, with improved carryover with subsequent trials requiring CGA    Balance Overall balance assessment: Needs assistance Sitting-balance support: No upper extremity supported Sitting balance-Leahy Scale: Good     Standing balance support: Bilateral upper extremity supported Standing balance-Leahy Scale: Fair                             ADL either performed or assessed with clinical judgement   ADL Overall ADL's : Needs assistance/impaired Eating/Feeding: Sitting;Independent   Grooming: Sitting;Independent   Upper Body Bathing: Sitting;Independent   Lower Body Bathing: Sit to/from stand;Minimal assistance   Upper Body Dressing :  Sitting;Independent   Lower Body Dressing: Sit to/from stand;Minimal assistance Lower Body Dressing Details (indicate cue type and reason): Pt/spouse instructed in use of AE for LB dressing and compression stocking mgt strategies w/ verbal instruction and visual demo. Pt/spouse verbalized understanding. Toilet Transfer:  RW;Ambulation;Min guard;Regular Toilet;Grab bars   Writer and Hygiene: Sit to/from stand;Min guard       Functional mobility during ADLs: Min guard;Rolling walker       Vision Baseline Vision/History: Wears glasses Wears Glasses: Reading only Patient Visual Report: No change from baseline       Perception     Praxis      Pertinent Vitals/Pain Pain Assessment: 0-10 Pain Score: 6  Pain Location: L knee. Pt reports varying levels of pain throughout session Pain Descriptors / Indicators: Operative site guarding;Aching Pain Intervention(s): Limited activity within patient's tolerance;Monitored during session;RN gave pain meds during session;Ice applied;Repositioned     Hand Dominance Right   Extremity/Trunk Assessment Upper Extremity Assessment Upper Extremity Assessment: Generalized weakness(grossly at least 4/5 bilaterally, RA B hands, R shoulder)   Lower Extremity Assessment Lower Extremity Assessment: Defer to PT evaluation       Communication Communication Communication: No difficulties   Cognition Arousal/Alertness: Awake/alert Behavior During Therapy: WFL for tasks assessed/performed Overall Cognitive Status: Within Functional Limits for tasks assessed                                     General Comments       Exercises Other Exercises Other Exercises: Pt/spouse instructed on falls prevention strategies Other Exercises: Pt/spouse instructed in polar care mgt    Shoulder Instructions      Home Living Family/patient expects to be discharged to:: Skilled nursing facility Living Arrangements: Spouse/significant other Available Help at Discharge: Family;Available 24 hours/day Type of Home: House Home Access: Level entry     Home Layout: Multi-level;Able to live on main level with bedroom/bathroom     Bathroom Shower/Tub: Tub/shower unit;Walk-in shower   Bathroom Toilet: Handicapped height(rails on  toilet) Bathroom Accessibility: Yes How Accessible: Accessible via walker Home Equipment: [place] - 4 wheels;Shower seat;Grab bars - toilet;Walker - 2 wheels;Hand held shower head;Adaptive equipment Adaptive Equipment: Reacher        Prior Functioning/Environment Level of Independence: Needs assistance  Gait / Transfers Assistance Needed: Pt ambulates with rollator vs single point cane. She denies falls in the last 12 months ADL's / Homemaking Assistance Needed: Reports independence with ADLs but assist from husband with IADLs 2:2 difficulty standing for any length of time. Typically sits to brush teeth, shower, etc. Spouse does most of the driving.            OT Problem List: Decreased strength;Decreased knowledge of use of DME or AE;Decreased range of motion;Decreased activity tolerance;Pain;Impaired balance (sitting and/or standing)      OT Treatment/Interventions: Self-care/ADL training;Balance training;Therapeutic exercise;Therapeutic activities;DME and/or AE instruction;Patient/family education    OT Goals(Current goals can be found in the care plan section) Acute Rehab OT Goals Patient Stated Goal: return to PLOF with less knee pain OT Goal Formulation: With patient/family Time For Goal Achievement: 08/21/17 Potential to Achieve Goals: Good ADL Goals Pt Will Perform Lower Body Dressing: sit to/from stand;with supervision;with adaptive equipment Pt Will Transfer to Toilet: with supervision;ambulating(LRAD for amb, toilet rails)  OT Frequency: Min 1X/week   Barriers to D/C:            Co-evaluation  AM-PAC PT "6 Clicks" Daily Activity     Outcome Measure Help from another person eating meals?: None Help from another person taking care of personal grooming?: None Help from another person toileting, which includes using toliet, bedpan, or urinal?: A Little Help from another person bathing (including washing, rinsing, drying)?: A Little Help from  another person to put on and taking off regular upper body clothing?: None Help from another person to put on and taking off regular lower body clothing?: A Little 6 Click Score: 21   End of Session Equipment Utilized During Treatment: Gait belt;Rolling walker  Activity Tolerance: Patient tolerated treatment well Patient left: in chair;with call bell/phone within reach;with chair alarm set;with family/visitor present;Other (comment)(polar care in place, no rolled towel)  OT Visit Diagnosis: Other abnormalities of gait and mobility (R26.89);Muscle weakness (generalized) (M62.81);Pain Pain - Right/Left: Left Pain - part of body: Knee                Time: 0825-0858 OT Time Calculation (min): 33 min Charges:  OT General Charges $OT Visit: 1 Visit OT Evaluation $OT Eval Moderate Complexity: 1 Mod OT Treatments $Self Care/Home Management : 8-22 mins  Jeni Salles, MPH, MS, OTR/L ascom [phone] 08/07/17, 9:26 AM

## 2017-08-07 NOTE — Progress Notes (Signed)
Physical Therapy Treatment Patient Details Name: Sylvia Miller MRN: [id number] DOB: 1940/03/08 Today's Date: 08/07/2017    History of Present Illness Pt admitted for L TKR without reported post-op complications prior to arrival on the floor. PMH includes PVD, hyperlipidemia, and RA among other comorbid conditions. PT evaluation performed on POD#0. OT evaluation performed on POD#1.    PT Comments    Pt continues to make good progress with therapy. She is able again to complete all supine and seated exercises as instructed. She is able to ambulate partially to RN station and back which is approximately 41' total. She continues to ambulate with slow speed and instruction provided for upright posture and to decrease UE reliance on rolling walker. Step-to pattern utilized by leading with LLE.  HR increases to around 120 bpm during ambulation and SaO2 remains at or above 98%. Pt denies DOE but is clearly fatigued at the end of the ambulation distance. She continues to require SNF placement at discharge. Pt will benefit from PT services to address deficits in strength, balance, and mobility in order to return to full function at home.    Follow Up Recommendations  SNF     Equipment Recommendations  None recommended by PT    Recommendations for Other Services OT consult     Precautions / Restrictions Precautions Precautions: Fall;Knee Precaution Booklet Issued: Yes (comment) Required Braces or Orthoses: Knee Immobilizer - Left Knee Immobilizer - Left: Discontinue once straight leg raise with < 10 degree lag(Pt able to perform SLR x10, no lag; no KI utilized) Restrictions Weight Bearing Restrictions: Yes LLE Weight Bearing: Weight bearing as tolerated    Mobility  Bed Mobility Overal bed mobility: Needs Assistance Bed Mobility: Supine to Sit     Supine to sit: Supervision     General bed mobility comments: Pt provided cues to utilize RLE to assist LLE when returning to bed. She is able  to perform bed mobility without external assist. Overhead trapeze required to scoot up toward [facility]  Transfers Overall transfer level: Needs assistance Equipment used: Rolling walker (2 wheeled) Transfers: Sit to/from Stand Sit to Stand: Min guard         General transfer comment: Increased time required to come to standing but improved from AM session. Stable in standing with UE support on rolling walker  Ambulation/Gait Ambulation/Gait assistance: Min guard Ambulation Distance (Feet): 90 Feet Assistive device: Rolling walker (2 wheeled) Gait Pattern/deviations: Decreased step length - right;Decreased step length - left;Step-to pattern Gait velocity: Below functional household limit for speed Gait velocity interpretation: <1.31 ft/sec, indicative of household ambulator General Gait Details: Pt is able to ambulate partially to RN station and back which is approximately 90' total. She continues to ambulate with slow speed and instruction provided for upright posture and to decrease UE reliance on rolling walker. HR increases to around 120 during ambulation and SaO2 remains at or above 98%. Pt denies DOE but is clearly fatigued at the end of the ambulation distance.   Stairs             Wheelchair Mobility    Modified Rankin (Stroke Patients Only)       Balance Overall balance assessment: Needs assistance Sitting-balance support: No upper extremity supported Sitting balance-Leahy Scale: Good     Standing balance support: Bilateral upper extremity supported Standing balance-Leahy Scale: Fair Standing balance comment: Requires UE support on walker for standing balance  Cognition Arousal/Alertness: Awake/alert Behavior During Therapy: WFL for tasks assessed/performed Overall Cognitive Status: Within Functional Limits for tasks assessed                                        Exercises Total Joint Exercises Ankle  Circles/Pumps: Both;Supine;Other (comment);15 reps(Heel raises x 15 bilaterally as well) Quad Sets: Both;Supine;15 reps Gluteal Sets: Both;Supine;15 reps Towel Squeeze: Both;Seated;15 reps Heel Slides: Left;15 reps;Supine Hip ABduction/ADduction: 15 reps;Supine;Left(x 15 bilaterally in sitting as well) Straight Leg Raises: Left;Supine;15 reps Long Arc Quad: Left;Seated;15 reps Knee Flexion: Left;Seated;15 reps Marching in Standing: Both;Seated;15 reps    General Comments        Pertinent Vitals/Pain Pain Assessment: No/denies pain Pain Location: Pt denies L knee pain currently at rest or during ambulation    Home Living                      Prior Function            PT Goals (current goals can now be found in the care plan section) Acute Rehab PT Goals Patient Stated Goal: return to PLOF with less knee pain PT Goal Formulation: With patient/family Time For Goal Achievement: 08/20/17 Potential to Achieve Goals: Good Progress towards PT goals: Progressing toward goals    Frequency    BID      PT Plan Current plan remains appropriate    Co-evaluation              AM-PAC PT "6 Clicks" Daily Activity  Outcome Measure  Difficulty turning over in bed (including adjusting bedclothes, sheets and blankets)?: A Little Difficulty moving from lying on back to sitting on the side of the bed? : A Lot Difficulty sitting down on and standing up from a chair with arms (e.g., wheelchair, bedside commode, etc,.)?: A Little Help needed moving to and from a bed to chair (including a wheelchair)?: A Little Help needed walking in hospital room?: A Little Help needed climbing 3-5 steps with a railing? : Total 6 Click Score: 15    End of Session Equipment Utilized During Treatment: Gait belt Activity Tolerance: Patient tolerated treatment well Patient left: with call bell/phone within reach;with SCD's reapplied;Other (comment);in bed;with bed alarm set(no towel roll  under heel per MD, polar care in place)   PT Visit Diagnosis: Unsteadiness on feet (R26.81);Muscle weakness (generalized) (M62.81);Difficulty in walking, not elsewhere classified (R26.2)     Time: 1342-     Charges:  $Gait Training: 8-22 mins $Therapeutic Exercise: 8-22 mins                    G Codes:       Lyndel Safe Huprich PT, DPT     Huprich,Jason 08/07/2017, 2:24 PM

## 2017-08-07 NOTE — Clinical Social Work Note (Signed)
Clinical Social Work Assessment  Patient Details  Name: Sylvia Miller MRN: [id number] Date of Birth: 07-Oct-1940  Date of referral:  08/07/17               Reason for consult:  Facility Placement                Permission sought to share information with:  Chartered certified accountant granted to share information::  Yes, Verbal Permission Granted  Name::      [place]::   [place]   Relationship::     Contact Information:     Housing/Transportation Living arrangements for the past 2 months:  [place] of Information:  Patient, Spouse Patient Interpreter Needed:  None Criminal Activity/Legal Involvement Pertinent to Current Situation/Hospitalization:  No - Comment as needed Significant Relationships:  Adult Children Lives with:  Spouse Do you feel safe going back to the place where you live?  Yes Need for family participation in patient care:  Yes (Comment)  Care giving concerns:  Patient lives with her husband Sylvia Miller in [place].    Social Worker assessment / plan:  Holiday representative (CSW) received SNF consult. PT is recommending SNF. CSW met with patient and her husband Sylvia Miller was at bedside. Patient was alert and oriented X4 and was sitting up in the chair at bedside. CSW introduced self and explained role of CSW department. Patient reported that she lives in [place] with her husband. CSW explained SNF process and that medicare requires a 3 night qualifying inpatient stat in a hospital in order to pay for SNF. Patient was admitted to inpatient on 08/06/17. Patient is agreeable to SNF search in [facility] and prefers [facility] FL2 complete and faxed out.   CSW presented bed offers to patient and husband. They chose [facility] Plan is for patient to D/C to [facility] Thursday 08/09/17 pending medical clearance. CSW faxed H&P to [facility]. [facility] admissions coordinator at [facility] is aware of above.  CSW will continue to follow and assist as needed.   Employment status:  Retired Forensic scientist:  [employer] PT Recommendations:  [place] / Referral to community resources:  [place]  Patient/Family's Response to care:  Patient chose [facility]   Patient/Family's Understanding of and Emotional Response to Diagnosis, Current Treatment, and Prognosis:  Patient and her husband were very pleasant and thanked CSW for assistance.   Emotional Assessment Appearance:  Appears stated age Attitude/Demeanor/Rapport:    Affect (typically observed):  Accepting, Adaptable, Pleasant Orientation:  Oriented to Place, Oriented to Self, Oriented to  Time, Oriented to Situation Alcohol / Substance use:  Not Applicable Psych involvement (Current and /or in the community):  No (Comment)  Discharge Needs  Concerns to be addressed:  Discharge Planning Concerns Readmission within the last 30 days:  No Current discharge risk:  Dependent with Mobility Barriers to Discharge:  Continued Medical Work up   [employer], Veronia Beets, LCSW 08/07/2017, 1:47 PM

## 2017-08-07 NOTE — Progress Notes (Signed)
ORTHOPAEDICS PROGRESS NOTE  PATIENT NAME: Sylvia Miller DOB: September 04, 1940  MRN: [id number]  POD # 1: Left total knee arthroplasty  Subjective: The patient states the pain is been under good control.  She rested well last night.  Line no nausea or vomiting.  Objective: Vital signs in last 24 hours: Temp:  [97.4 F (36.3 C)-98.4 F (36.9 C)] 98 F (36.7 C) (06/04 0413) Pulse Rate:  [57-75] 61 (06/04 0429) Resp:  [12-17] 15 (06/04 0413) BP: (104-181)/(39-99) 119/42 (06/04 0429) SpO2:  [95 %-100 %] 100 % (06/04 0413)  Intake/Output from previous day: 06/03 0701 - 06/04 0700 In: 3080 [I.V.:2970; IV Piggyback:110] Out: 2930 [Urine:2390; Drains:490; Blood:50]  Recent Labs    08/07/17 0546  WBC 5.2  HGB 9.2*  HCT 26.7*  PLT 128*  K 3.8  CL 112*  CO2 21*  BUN 11  CREATININE 0.90  GLUCOSE 102*  CALCIUM 8.2*    EXAM General: Well-developed well-nourished female seen in no apparent discomfort. Lungs: clear to auscultation Cardiac: normal rate and regular rhythm Left lower extremity: Hemovac drain and Polar Care are in place and functioning.  Homans test is negative.  Dressing is dry and intact.  The patient can perform a straight leg raise with minimal assistance. Neurologic: Awake, alert, oriented.  Sensory and motor function are intact.  Good active ankle dorsiflexion bilaterally.  Assessment: Left total knee arthroplasty  Secondary diagnoses: Rheumatoid arthritis Myelodysplastic syndrome Memory deficit Hypertension Hyperlipidemia Gastroesophageal reflux disease Bladder incontinence Anemia-acute blood loss anemia superimposed on chronic anemia  Plan: Today's goals were reviewed with the patient.  Continue with physical therapy and Occupational Therapy as per total knee arthroplasty rehab protocol. Plan is to go Skilled nursing facility after hospital stay. DVT Prophylaxis - Lovenox, Foot Pumps and TED hose  James P. Holley Bouche M.D.

## 2017-08-07 NOTE — Anesthesia Postprocedure Evaluation (Signed)
Anesthesia Post Note  Patient: Sylvia Miller  Procedure(s) Performed: COMPUTER ASSISTED TOTAL KNEE ARTHROPLASTY (Left Knee)  Patient location during evaluation: PACU Anesthesia Type: General Level of consciousness: awake and alert and oriented Pain management: pain level controlled Vital Signs Assessment: post-procedure vital signs reviewed and stable Respiratory status: spontaneous breathing Cardiovascular status: blood pressure returned to baseline Anesthetic complications: no     Last Vitals:  Vitals:   08/07/17 0818 08/07/17 1219  BP: (!) 141/56 (!) 115/48  Pulse: (!) 59 60  Resp:    Temp: 37 C 36.9 C  SpO2: 98% 96%    Last Pain:  Vitals:   08/07/17 1219  TempSrc: Oral  PainSc:                  Blayke Cordrey

## 2017-08-08 LAB — CBC
HCT: 24.6 % — ABNORMAL LOW (ref 35.0–47.0)
Hemoglobin: 8.2 g/dL — ABNORMAL LOW (ref 12.0–16.0)
MCH: 32.6 pg (ref 26.0–34.0)
MCHC: 33.4 g/dL (ref 32.0–36.0)
MCV: 97.6 fL (ref 80.0–100.0)
PLATELETS: 123 10*3/uL — AB (ref 150–440)
RBC: 2.52 MIL/uL — AB (ref 3.80–5.20)
RDW: 15 % — ABNORMAL HIGH (ref 11.5–14.5)
WBC: 4.5 10*3/uL (ref 3.6–11.0)

## 2017-08-08 LAB — BASIC METABOLIC PANEL
ANION GAP: 6 (ref 5–15)
BUN: 15 mg/dL (ref 6–20)
CO2: 24 mmol/L (ref 22–32)
Calcium: 8.4 mg/dL — ABNORMAL LOW (ref 8.9–10.3)
Chloride: 111 mmol/L (ref 101–111)
Creatinine, Ser: 0.9 mg/dL (ref 0.44–1.00)
GFR calc Af Amer: >60 mL/min (ref >60)
Glucose, Bld: 92 mg/dL (ref 65–99)
POTASSIUM: 3.7 mmol/L (ref 3.5–5.1)
SODIUM: 141 mmol/L (ref 135–145)

## 2017-08-08 MED ORDER — OXYCODONE HCL 5 MG PO TABS: 5.0000 mg | ORAL_TABLET | ORAL | 0 refills | Status: DC | PRN

## 2017-08-08 MED ORDER — GABAPENTIN 300 MG PO CAPS: 300.0000 mg | ORAL_CAPSULE | Freq: Every day | ORAL | 0 refills | Status: DC

## 2017-08-08 MED ORDER — ENOXAPARIN SODIUM 40 MG/0.4ML ~~LOC~~ SOLN: 40.0000 mg | SUBCUTANEOUS | 0 refills | Status: DC

## 2017-08-08 MED ORDER — TRAMADOL HCL 50 MG PO TABS: 50.0000 mg | ORAL_TABLET | ORAL | 1 refills | Status: DC | PRN

## 2017-08-08 MED ORDER — CELECOXIB 200 MG PO CAPS
200.0000 mg | ORAL_CAPSULE | Freq: Two times a day (BID) | ORAL | 1 refills | Status: DC
Start: 2017-08-08 — End: 2018-05-23

## 2017-08-08 NOTE — Progress Notes (Signed)
ORTHOPAEDICS PROGRESS NOTE  PATIENT NAME: Sylvia Miller DOB: 01-19-1941  MRN: [id number]  POD # 2: Left total knee arthroplasty  Subjective: The patient states the pain is been under good control.  She rested well last night.  no nausea or vomiting.  Objective: Vital signs in last 24 hours: Temp:  [98.5 F (36.9 C)-98.9 F (37.2 C)] 98.9 F (37.2 C) (06/04 1709) Pulse Rate:  [59-82] 79 (06/04 2331) BP: (106-159)/(38-76) 119/42 (06/04 2331) SpO2:  [96 %-98 %] 97 % (06/04 2330)  Intake/Output from previous day: 06/04 0701 - 06/05 0700 In: 960 [P.O.:960] Out: 300 [Drains:300]  Recent Labs    08/07/17 0546 08/08/17 0423  WBC 5.2 4.5  HGB 9.2* 8.2*  HCT 26.7* 24.6*  PLT 128* 123*  K 3.8 3.7  CL 112* 111  CO2 21* 24  BUN 11 15  CREATININE 0.90 0.90  GLUCOSE 102* 92  CALCIUM 8.2* 8.4*    EXAM General: Well-developed well-nourished female seen in no apparent discomfort. Lungs: clear to auscultation Cardiac: normal rate and regular rhythm Left lower extremity: Hemovac removed and Polar Care are in place and functioning.  Homans test is negative.  Dressing is dry and intact.  The patient can perform a straight leg raise with minimal assistance. Neurologic: Awake, alert, oriented.  Sensory and motor function are intact.  Good active ankle dorsiflexion bilaterally.  Assessment: Left total knee arthroplasty  Secondary diagnoses: Rheumatoid arthritis Myelodysplastic syndrome Memory deficit Hypertension Hyperlipidemia Gastroesophageal reflux disease Bladder incontinence Anemia-acute blood loss anemia superimposed on chronic anemia  Plan: Today's goals were reviewed with the patient.  Continue with physical therapy and Occupational Therapy as per total knee arthroplasty rehab protocol. Plan is to go Skilled nursing facility Thursday. DVT Prophylaxis - Lovenox, Foot Pumps and TED hose  Reche Dixon PA-C

## 2017-08-08 NOTE — Progress Notes (Signed)
Occupational Therapy Treatment Patient Details Name: Sylvia Miller MRN: [id number] DOB: 09/17/1940 Today's Date: 08/08/2017    History of present illness Pt admitted for L TKR without reported post-op complications prior to arrival on the floor. PMH includes PVD, hyperlipidemia, and RA among other comorbid conditions. PT evaluation performed on POD#0. OT evaluation performed on POD#1.   OT comments  Pt seen for OT tx this date focused on toilet transfers and energy conservation strategies. Pt reporting her head/face feeling warm and the rest of her body feeling cold. Not sweaty or clammy. Pt reporting feeling very fatigued this date. Upon initial stand from recliner with RW and CGA, pt states she feels too fatigued to ambulate to the bathroom for toileting, requesting to sit and then transfer to [facility] beside recliner instead. Once set up, pt required CGA and additional verbal cues for hand placement on RW to improve safety and bring COG over BOS for transfer with good carryover. CGA during standing hygiene. Pt provided with cool washcloth to apply to her face. RN notified at end of session of pt's complaints. Pt continues to benefit from skilled OT services. Will continue to progress. Remains STR appropriate.    Follow Up Recommendations  SNF    Equipment Recommendations  3 in 1 bedside commode    Recommendations for Other Services      Precautions / Restrictions Precautions Precautions: Fall;Knee Required Braces or Orthoses: Knee Immobilizer - Left Knee Immobilizer - Left: Discontinue once straight leg raise with < 10 degree lag(Pt able to perform SLR x10, no lag; no KI utilized) Restrictions Weight Bearing Restrictions: Yes LLE Weight Bearing: Weight bearing as tolerated       Mobility Bed Mobility               General bed mobility comments: deferred, up in recliner  Transfers Overall transfer level: Needs assistance Equipment used: Rolling walker (2 wheeled) Transfers:  Sit to/from Stand Sit to Stand: Min guard         General transfer comment: increased time, verbal cues for hand placement and to bring COG over BOS    Balance Overall balance assessment: Needs assistance Sitting-balance support: No upper extremity supported Sitting balance-Leahy Scale: Good     Standing balance support: Bilateral upper extremity supported Standing balance-Leahy Scale: Fair                             ADL either performed or assessed with clinical judgement   ADL Overall ADL's : Needs assistance/impaired                         Toilet Transfer: BSC;RW;Min guard;Ambulation;Cueing for safety Toilet Transfer Details (indicate cue type and reason): VC for hand placement to maximize safety Toileting- Clothing Manipulation and Hygiene: Sit to/from stand;Min guard;Set up [place] Details (indicate cue type and reason): CGA while in standing to perform hygiene and change pad     Functional mobility during ADLs: Min guard;Rolling walker       Vision Baseline Vision/History: Wears glasses Wears Glasses: Reading only Patient Visual Report: No change from baseline     Perception     Praxis      Cognition Arousal/Alertness: Awake/alert Behavior During Therapy: WFL for tasks assessed/performed Overall Cognitive Status: Within Functional Limits for tasks assessed  Exercises Other Exercises Other Exercises: Pt educated in energy conservation strategies   Shoulder Instructions       General Comments      Pertinent Vitals/ Pain       Pain Assessment: 0-10 Pain Score: 5  Pain Location: L knee Pain Descriptors / Indicators: Aching Pain Intervention(s): Limited activity within patient's tolerance;Monitored during session;Repositioned;Ice applied  Home Living                                          Prior Functioning/Environment               Frequency  Min 1X/week        Progress Toward Goals  OT Goals(current goals can now be found in the care plan section)  Progress towards OT goals: Progressing toward goals  Acute Rehab OT Goals Patient Stated Goal: return to PLOF with less knee pain OT Goal Formulation: With patient/family Time For Goal Achievement: 08/21/17 Potential to Achieve Goals: Good  Plan Discharge plan remains appropriate;Frequency remains appropriate    Co-evaluation                 AM-PAC PT "6 Clicks" Daily Activity     Outcome Measure   Help from another person eating meals?: None Help from another person taking care of personal grooming?: None Help from another person toileting, which includes using toliet, bedpan, or urinal?: A Little Help from another person bathing (including washing, rinsing, drying)?: A Little Help from another person to put on and taking off regular upper body clothing?: None Help from another person to put on and taking off regular lower body clothing?: A Little 6 Click Score: 21    End of Session Equipment Utilized During Treatment: Gait belt;Rolling walker  OT Visit Diagnosis: Other abnormalities of gait and mobility (R26.89);Muscle weakness (generalized) (M62.81);Pain Pain - Right/Left: Left Pain - part of body: Knee   Activity Tolerance Patient limited by fatigue   Patient Left in chair;with call bell/phone within reach;with chair alarm set;with family/visitor present;Other (comment)(polar care in place)   Nurse Communication          Time: [id number] OT Time Calculation (min): 25 min  Charges: OT General Charges $OT Visit: 1 Visit OT Treatments $Self Care/Home Management : 23-37 mins  Jeni Salles, MPH, MS, OTR/L ascom [phone] 08/08/17, 4:05 PM

## 2017-08-08 NOTE — Progress Notes (Signed)
Physical Therapy Treatment Patient Details Name: Sylvia Miller MRN: [id number] DOB: 14-Jul-1940 Today's Date: 08/08/2017    History of Present Illness Pt admitted for L TKR without reported post-op complications prior to arrival on the floor. PMH includes PVD, hyperlipidemia, and RA among other comorbid conditions. PT evaluation performed on POD#0. OT evaluation performed on POD#1.    PT Comments    Pt is slightly upset this afternoon due to some issues going on in her family. However she agrees to work with therapy. She reports higher pain this afternoon and has not had pain medication since the morning so RN contacted who administered pain medication before session. She is able to complete all supine and seated exercises as instructed with improving strength. She is unable to ambulate quite as far as she was during the AM session. Step length remains short and stance width is narrow. Pt unable to ambulate farther at this time and returned to bed. She will need SNF placement at discharge. Pt will benefit from PT services to address deficits in strength, balance, and mobility in order to return to full function at home.    Follow Up Recommendations  SNF     Equipment Recommendations  None recommended by PT    Recommendations for Other Services OT consult     Precautions / Restrictions Precautions Precautions: Fall;Knee Precaution Booklet Issued: Yes (comment) Required Braces or Orthoses: Knee Immobilizer - Left Knee Immobilizer - Left: Discontinue once straight leg raise with < 10 degree lag(Pt able to perform SLR x10, no lag; no KI utilized) Restrictions Weight Bearing Restrictions: Yes LLE Weight Bearing: Weight bearing as tolerated    Mobility  Bed Mobility Overal bed mobility: Modified Independent Bed Mobility: Sit to Supine       Sit to supine: Modified independent (Device/Increase time)   General bed mobility comments: Pt able to perform sit to supine with increase in  time but no external assist from therapist.   Transfers Overall transfer level: Needs assistance Equipment used: Rolling walker (2 wheeled) Transfers: Sit to/from Stand Sit to Stand: Min guard         General transfer comment: increased time, safe hand placement demonstrated without cues. Good stability in standing with bilateral UE support on rolling walker.  Ambulation/Gait Ambulation/Gait assistance: Min guard Ambulation Distance (Feet): 40 Feet Assistive device: Rolling walker (2 wheeled) Gait Pattern/deviations: Decreased step length - right;Decreased step length - left;Step-to pattern Gait velocity: Below functional household limit for household mobility   General Gait Details: Pt with increased pain this afternoon and is unable to ambulate as far as the AM session. Step length remains short and stance width is narrow. Pt unable to ambulate farther at this time   Stairs             Wheelchair Mobility    Modified Rankin (Stroke Patients Only)       Balance Overall balance assessment: Needs assistance Sitting-balance support: No upper extremity supported Sitting balance-Leahy Scale: Good     Standing balance support: Bilateral upper extremity supported Standing balance-Leahy Scale: Fair Standing balance comment: Requires UE support on walker for standing balance                            Cognition Arousal/Alertness: Awake/alert Behavior During Therapy: WFL for tasks assessed/performed Overall Cognitive Status: Within Functional Limits for tasks assessed  Exercises Total Joint Exercises Ankle Circles/Pumps: Both;Supine;Other (comment);15 reps(Heel raises x 15 bilaterally as well) Quad Sets: Both;Supine;15 reps Gluteal Sets: Both;Supine;15 reps Towel Squeeze: Both;Supine;10 reps Heel Slides: Left;Supine;10 reps Hip ABduction/ADduction: Supine;Left;10 reps(x 15 bilaterally in sitting as  well) Straight Leg Raises: Left;Supine;10 reps Long Arc Quad: Left;Seated;10 reps Knee Flexion: Left;Seated;10 reps Marching in Standing: Both;Seated;10 reps Other Exercises Other Exercises: Pt educated in energy conservation strategies    General Comments        Pertinent Vitals/Pain Pain Assessment: 0-10 Pain Score: 7  Pain Location: L knee Pain Descriptors / Indicators: Aching Pain Intervention(s): Patient requesting pain meds-RN notified;Premedicated before session    Home Living                      Prior Function            PT Goals (current goals can now be found in the care plan section) Acute Rehab PT Goals Patient Stated Goal: return to PLOF with less knee pain PT Goal Formulation: With patient/family Time For Goal Achievement: 08/20/17 Potential to Achieve Goals: Good Progress towards PT goals: Progressing toward goals    Frequency    BID      PT Plan Current plan remains appropriate    Co-evaluation              AM-PAC PT "6 Clicks" Daily Activity  Outcome Measure  Difficulty turning over in bed (including adjusting bedclothes, sheets and blankets)?: A Little Difficulty moving from lying on back to sitting on the side of the bed? : A Little Difficulty sitting down on and standing up from a chair with arms (e.g., wheelchair, bedside commode, etc,.)?: A Little Help needed moving to and from a bed to chair (including a wheelchair)?: A Little Help needed walking in hospital room?: A Little Help needed climbing 3-5 steps with a railing? : Total 6 Click Score: 16    End of Session Equipment Utilized During Treatment: Gait belt Activity Tolerance: Patient tolerated treatment well Patient left: with call bell/phone within reach;with SCD's reapplied;Other (comment);in bed;with bed alarm set(no towel roll under heel per MD, polar care in place)   PT Visit Diagnosis: Unsteadiness on feet (R26.81);Muscle weakness (generalized)  (M62.81);Difficulty in walking, not elsewhere classified (R26.2)     Time: 8937-3428 PT Time Calculation (min) (ACUTE ONLY): 18 min  Charges:  $Therapeutic Exercise: 8-22 mins                    G Codes:       Lyndel Safe Huprich PT, DPT     Huprich,Jason 08/08/2017, 5:12 PM

## 2017-08-08 NOTE — Progress Notes (Signed)
Physical Therapy Treatment Patient Details Name: Sylvia Miller MRN: [id number] DOB: 06/19/1940 Today's Date: 08/08/2017    History of Present Illness Pt admitted for L TKR without reported post-op complications prior to arrival on the floor. PMH includes PVD, hyperlipidemia, and RA among other comorbid conditions. PT evaluation performed on POD#0. OT evaluation performed on POD#1.    PT Comments    Pt continues to make good progress this morning. She is able to ambulate to RN station and back to recliner. Pt admitted with above diagnosis. Pt currently with functional limitations due to the deficits listed below (see PT Problem List). She requires CGA for bed mobility and transfers. She does slightly worse with ambulation today. She is increasingly fatigued today with walking however VSS. Pt denies DOE. Gait speed remains very slow and pt requires significant UE support on walker. She is able to complete all seated exercises as instructed but with extensor lag during LAQ. AAROM is improving well from 0-87 degrees. Pt will benefit from PT services to address deficits in strength, balance, and mobility in order to return to full function at home.     Follow Up Recommendations  SNF     Equipment Recommendations  None recommended by PT    Recommendations for Other Services OT consult     Precautions / Restrictions Precautions Precautions: Fall;Knee Precaution Booklet Issued: Yes (comment) Required Braces or Orthoses: Knee Immobilizer - Left Knee Immobilizer - Left: Discontinue once straight leg raise with < 10 degree lag(Pt able to perform SLR x10, no lag; no KI utilized) Restrictions Weight Bearing Restrictions: Yes LLE Weight Bearing: Weight bearing as tolerated    Mobility  Bed Mobility Overal bed mobility: Modified Independent Bed Mobility: Supine to Sit     Supine to sit: Modified independent (Device/Increase time)     General bed mobility comments: Pt moves slowly but does  not require external assistance or cues. HOB elevated and use of bed rails  Transfers Overall transfer level: Needs assistance Equipment used: Rolling walker (2 wheeled) Transfers: Sit to/from Stand Sit to Stand: Min guard         General transfer comment: Increased time required to come to standing but improved from prior sessions. She continues to require cues for safe hand placement  Ambulation/Gait Ambulation/Gait assistance: Min guard Ambulation Distance (Feet): 60 Feet Assistive device: Rolling walker (2 wheeled) Gait Pattern/deviations: Decreased step length - right;Decreased step length - left;Step-to pattern Gait velocity: Below functional household limit for speed Gait velocity interpretation: <1.31 ft/sec, indicative of household ambulator General Gait Details: Pt does slightly worse with ambulation today. She is increasingly fatigued today however VSS. Pt denies DOE. Gait speed remains very slow and pt requires significant UE support on walker.   Stairs             Wheelchair Mobility    Modified Rankin (Stroke Patients Only)       Balance Overall balance assessment: Needs assistance Sitting-balance support: No upper extremity supported Sitting balance-Leahy Scale: Good     Standing balance support: Bilateral upper extremity supported Standing balance-Leahy Scale: Fair Standing balance comment: Requires UE support on walker for standing balance                            Cognition Arousal/Alertness: Awake/alert Behavior During Therapy: WFL for tasks assessed/performed Overall Cognitive Status: Within Functional Limits for tasks assessed  Exercises Total Joint Exercises Ankle Circles/Pumps: Both;Supine;Other (comment);15 reps(Heel raises x 15 bilaterally as well) Quad Sets: Both;Supine;15 reps Gluteal Sets: Both;Supine;15 reps Towel Squeeze: Both;15 reps;Supine Heel Slides:  Left;15 reps;Supine Hip ABduction/ADduction: 15 reps;Supine;Left(x 15 bilaterally in sitting as well) Straight Leg Raises: Left;Supine;15 reps Long Arc Quad: Left;Seated;15 reps Knee Flexion: Left;Seated;15 reps Goniometric ROM: 0-87 degrees AAROM Marching in Standing: Both;Seated;15 reps    General Comments        Pertinent Vitals/Pain Pain Assessment: 0-10 Pain Score: 6  Pain Location: L knee Pain Descriptors / Indicators: Aching Pain Intervention(s): Monitored during session    Home Living                      Prior Function            PT Goals (current goals can now be found in the care plan section) Acute Rehab PT Goals Patient Stated Goal: return to PLOF with less knee pain PT Goal Formulation: With patient/family Time For Goal Achievement: 08/20/17 Potential to Achieve Goals: Good Progress towards PT goals: Progressing toward goals    Frequency    BID      PT Plan Current plan remains appropriate    Co-evaluation              AM-PAC PT "6 Clicks" Daily Activity  Outcome Measure  Difficulty turning over in bed (including adjusting bedclothes, sheets and blankets)?: A Little Difficulty moving from lying on back to sitting on the side of the bed? : A Little Difficulty sitting down on and standing up from a chair with arms (e.g., wheelchair, bedside commode, etc,.)?: A Little Help needed moving to and from a bed to chair (including a wheelchair)?: A Little Help needed walking in hospital room?: A Little Help needed climbing 3-5 steps with a railing? : Total 6 Click Score: 16    End of Session Equipment Utilized During Treatment: Gait belt Activity Tolerance: Patient tolerated treatment well Patient left: with call bell/phone within reach;with SCD's reapplied;Other (comment);in bed;with bed alarm set(no towel roll under heel per MD, polar care in place) Nurse Communication: Other (comment)(Spoke with MD regarding altered sensation in L  ankle) PT Visit Diagnosis: Unsteadiness on feet (R26.81);Muscle weakness (generalized) (M62.81);Difficulty in walking, not elsewhere classified (R26.2)     Time: 8841-6606 PT Time Calculation (min) (ACUTE ONLY): 35 min  Charges:  $Gait Training: 8-22 mins $Therapeutic Exercise: 8-22 mins                    G Codes:       Lyndel Safe Evia Goldsmith PT, DPT     Christabelle Hanzlik 08/08/2017, 9:33 AM

## 2017-08-08 NOTE — Progress Notes (Signed)
Plan is for patient to D/C to [facility] Thursday 08/09/17 pending medical clearance. [facility] admissions coordinator at [facility] is aware of above.   [employer], LCSW [phone]

## 2017-08-09 NOTE — Progress Notes (Signed)
Physical Therapy Treatment Patient Details Name: Sylvia Miller MRN: [id number] DOB: 14-Nov-1940 Today's Date: 08/09/2017    History of Present Illness Pt admitted for L TKR without reported post-op complications prior to arrival on the floor. PMH includes PVD, hyperlipidemia, and RA among other comorbid conditions. PT evaluation performed on POD#0. OT evaluation performed on POD#1.    PT Comments    Pt admitted with above diagnosis. Pt currently with functional limitations due to the deficits listed below (see PT Problem List). Pt demonstrates improved speed and sequencing with bed mobility and transfers on this date. She is able to slightly increase her ambulation distance but it is still significantly limited. Pt is able to ambulate partially to RN station, approximately 75.' HR increases to 120 bpm and SaO2 greater than 95% on room air. Pt unable to ambulate farther at this time due to fatigue. AAROM is improved to 0-93 degrees and she is able to complete all seated and supine exercises with improving LLE strength. Pt is safe to discharge to SNF when medically appropriate. Pt will benefit from PT services to address deficits in strength, balance, and mobility in order to return to full function at home.     Follow Up Recommendations  SNF     Equipment Recommendations  None recommended by PT    Recommendations for Other Services OT consult     Precautions / Restrictions Precautions Precautions: Fall;Knee Precaution Booklet Issued: Yes (comment) Required Braces or Orthoses: Knee Immobilizer - Left Knee Immobilizer - Left: Discontinue once straight leg raise with < 10 degree lag(Pt able to perform SLR x10, no lag; no KI utilized) Restrictions Weight Bearing Restrictions: Yes LLE Weight Bearing: Weight bearing as tolerated    Mobility  Bed Mobility Overal bed mobility: Modified Independent Bed Mobility: Supine to Sit     Supine to sit: Modified independent (Device/Increase time)      General bed mobility comments: Increased time and use of bed rails. HOB elevated. No external assist from therapist  Transfers Overall transfer level: Needs assistance Equipment used: Rolling walker (2 wheeled) Transfers: Sit to/from Stand Sit to Stand: Min guard         General transfer comment: Improving speed today with transfers. Safe hand placement and good stability once upright in standing  Ambulation/Gait Ambulation/Gait assistance: Min guard   Assistive device: Rolling walker (2 wheeled) Gait Pattern/deviations: Decreased step length - right;Decreased step length - left;Step-to pattern Gait velocity: Below functional household limit for household mobility Gait velocity interpretation: <1.31 ft/sec, indicative of household ambulator General Gait Details: Pt is able to slightly increase her ambulation distance but it is still significantly limited. She is able to ambulate partially to RN station, approximately 75.' HR increases to 120 bpm and SaO2 greater than 95% on room air. Pt unable to ambulate farther at this time due to fatigue   Stairs             Wheelchair Mobility    Modified Rankin (Stroke Patients Only)       Balance Overall balance assessment: Needs assistance Sitting-balance support: No upper extremity supported Sitting balance-Leahy Scale: Good     Standing balance support: Bilateral upper extremity supported Standing balance-Leahy Scale: Fair Standing balance comment: Requires UE support on walker for standing balance                            Cognition Arousal/Alertness: Awake/alert Behavior During Therapy: WFL for tasks assessed/performed Overall Cognitive Status:  Within Functional Limits for tasks assessed                                        Exercises Total Joint Exercises Ankle Circles/Pumps: Both;Supine;Other (comment);15 reps(Heel raises x 10 bilaterally as well) Quad Sets: Both;Supine;15  reps Gluteal Sets: Both;Supine;10 reps Towel Squeeze: Both;Supine;10 reps Heel Slides: Left;Supine;10 reps Hip ABduction/ADduction: Supine;Left;10 reps(x 15 bilaterally in sitting as well) Straight Leg Raises: Left;Supine;10 reps Long Arc Quad: Left;Seated;10 reps Knee Flexion: Left;Seated;10 reps Goniometric ROM: 0-93 degrees AAROM Marching in Standing: Both;Seated;10 reps    General Comments        Pertinent Vitals/Pain Pain Assessment: 0-10 Pain Score: 6  Pain Location: L knee Pain Descriptors / Indicators: Aching Pain Intervention(s): Monitored during session    Home Living                      Prior Function            PT Goals (current goals can now be found in the care plan section) Acute Rehab PT Goals Patient Stated Goal: return to PLOF with less knee pain PT Goal Formulation: With patient/family Time For Goal Achievement: 08/20/17 Potential to Achieve Goals: Good Progress towards PT goals: Progressing toward goals    Frequency    BID      PT Plan Current plan remains appropriate    Co-evaluation              AM-PAC PT "6 Clicks" Daily Activity  Outcome Measure  Difficulty turning over in bed (including adjusting bedclothes, sheets and blankets)?: A Little Difficulty moving from lying on back to sitting on the side of the bed? : A Little Difficulty sitting down on and standing up from a chair with arms (e.g., wheelchair, bedside commode, etc,.)?: A Little Help needed moving to and from a bed to chair (including a wheelchair)?: A Little Help needed walking in hospital room?: A Little Help needed climbing 3-5 steps with a railing? : Total 6 Click Score: 16    End of Session Equipment Utilized During Treatment: Gait belt Activity Tolerance: Patient tolerated treatment well Patient left: with call bell/phone within reach;with SCD's reapplied;Other (comment);in bed;with bed alarm set(bone foam under heel, polar care in place)   PT  Visit Diagnosis: Unsteadiness on feet (R26.81);Muscle weakness (generalized) (M62.81);Difficulty in walking, not elsewhere classified (R26.2)     Time: 2778-2423 PT Time Calculation (min) (ACUTE ONLY): 25 min  Charges:  $Gait Training: 8-22 mins $Therapeutic Exercise: 8-22 mins                    G Codes:       Lyndel Safe Jaesean Litzau PT, DPT     Lenola Lockner 08/09/2017, 10:08 AM

## 2017-08-09 NOTE — Progress Notes (Signed)
Dressing changed. CDI. Report called to facility, [facility] LPN. Called transport, en route.

## 2017-08-09 NOTE — Progress Notes (Signed)
Patient is medically stable for D/C to [facility] today. Per [facility] admissions coordinator at [facility] patient can come today to room 217. RN will call report at [phone] and arrange EMS for transport. Clinical Education officer, museum (CSW) sent D/C orders to [facility] via [employer]. Patient is aware of above. Patient's husband [name] is at bedside and aware of above. Please reconsult if future social work needs arise. CSW signing off.   [employer], LCSW [phone]

## 2017-08-09 NOTE — Progress Notes (Signed)
ORTHOPAEDICS PROGRESS NOTE  PATIENT NAME: Sylvia Miller DOB: Apr 17, 1940  MRN: [id number]  POD # 3: Left total knee arthroplasty  Subjective: The patient states the pain is been under good control.  She rested well last night.  no nausea or vomiting.  Objective: Vital signs in last 24 hours: Temp:  [98.6 F (37 C)-98.7 F (37.1 C)] 98.7 F (37.1 C) (06/05 2337) Pulse Rate:  [83-106] 83 (06/05 2337) Resp:  [16-18] 16 (06/05 2337) BP: (135-144)/(52-67) 135/52 (06/05 2337) SpO2:  [95 %-97 %] 95 % (06/05 2337)  Intake/Output from previous day: 06/05 0701 - 06/06 0700 In: 1200 [P.O.:1200] Out: -   Recent Labs    08/07/17 0546 08/08/17 0423  WBC 5.2 4.5  HGB 9.2* 8.2*  HCT 26.7* 24.6*  PLT 128* 123*  K 3.8 3.7  CL 112* 111  CO2 21* 24  BUN 11 15  CREATININE 0.90 0.90  GLUCOSE 102* 92  CALCIUM 8.2* 8.4*    EXAM General: Well-developed well-nourished female seen in no apparent discomfort. Lungs: clear to auscultation Cardiac: normal rate and regular rhythm Left lower extremity: Hemovac discontinued yesterday and Polar Care are in place and functioning.  Homans test is negative.  Dressing is dry and intact.  The patient can perform a straight leg raise with minimal assistance. Neurologic: Awake, alert, oriented.  Sensory and motor function are intact.  Good active ankle dorsiflexion bilaterally.  Assessment: Left total knee arthroplasty  Secondary diagnoses: Rheumatoid arthritis Myelodysplastic syndrome Memory deficit Hypertension Hyperlipidemia Gastroesophageal reflux disease Bladder incontinence Anemia-acute blood loss anemia superimposed on chronic anemia  Plan: Today's goals were reviewed with the patient.  Continue with physical therapy and Occupational Therapy as per total knee arthroplasty rehab protocol. Plan is to go Skilled nursing facility today. DVT Prophylaxis - Lovenox, Foot Pumps and TED hose  Reche Dixon PA-C

## 2017-08-09 NOTE — Clinical Social Work Placement (Signed)
   CLINICAL SOCIAL WORK PLACEMENT  NOTE  Date:  08/09/2017  Patient Details  Name: Sylvia Miller MRN: [id number] Date of Birth: 04-Sep-1940  Clinical Social Work is seeking post-discharge placement for this patient at the [place] level of care (*CSW will initial, date and re-position this form in  chart as items are completed):  Yes   Patient/family provided with [place] Work Department's list of facilities offering this level of care within the geographic area requested by the patient (or if unable, by the patient's family).  Yes   Patient/family informed of their freedom to choose among providers that offer the needed level of care, that participate in Medicare, Medicaid or managed care program needed by the patient, have an available bed and are willing to accept the patient.  Yes   Patient/family informed of [place]'s ownership interest in [facility] and [facility], as well as of the fact that they are under no obligation to receive care at these facilities.  PASRR submitted to EDS on       PASRR number received on       Existing PASRR number confirmed on 08/07/17     FL2 transmitted to all facilities in geographic area requested by pt/family on 08/07/17     FL2 transmitted to all facilities within larger geographic area on       Patient informed that his/her managed care company has contracts with or will negotiate with certain facilities, including the following:        Yes   Patient/family informed of bed offers received.  Patient chooses bed at [facility] )     Physician recommends and patient chooses bed at      Patient to be transferred to [facility] ) on 08/09/17.  Patient to be transferred to facility by [facility] )     Patient family notified on 08/09/17 of transfer.  Name of family member notified:  (Patient's husband [name] is at bedside and aware of D/C today. )     PHYSICIAN        Additional Comment:    _______________________________________________ Paelyn Smick, Veronia Beets, LCSW 08/09/2017, 9:43 AM

## 2017-08-09 NOTE — Discharge Summary (Signed)
Physician Discharge Summary  Subjective: 3 Days Post-Op Procedure(s) (LRB): COMPUTER ASSISTED TOTAL KNEE ARTHROPLASTY (Left) Patient reports pain as mild.   Patient seen in rounds with Dr. Marry Guan. Patient is well, and has had no acute complaints or problems Patient is ready to go to [facility] for rehab and physical therapy.  Physician Discharge Summary  Patient ID: Sylvia Miller MRN: [id number] DOB/AGE: [birth year] 77 y.o.  Admit date: 08/06/2017 Discharge date: 08/09/2017  Admission Diagnoses:  Discharge Diagnoses:  Active Problems:   S/P total knee arthroplasty   Discharged Condition: fair  Hospital Course: The patient is postop day 3 from a left total knee replacement done by Dr. Marry Guan.  The patient is doing well since surgery.  She has had a bowel movement.  The patient has been working with physical therapy and ambulated up to 40 feet yesterday.  She is able to do a straight leg raise.  She has had no complication and has normal vitals.  Treatments: surgery:   Left total knee arthroplasty using computer-assisted navigation  SURGEON:  Marciano Sequin. M.D.  ANESTHESIA: general  ESTIMATED BLOOD LOSS: 50 mL  FLUIDS REPLACED: 1300 mL of crystalloid  TOURNIQUET TIME: 114 minutes  DRAINS: 2 medium Hemovac drains  SOFT TISSUE RELEASES: Anterior cruciate ligament, posterior cruciate ligament, deep medial collateral ligament, patellofemoral ligament, posterolateral corner  IMPLANTS UTILIZED: DePuy Attune size 3 posterior stabilized femoral component (cemented), size 3 rotating platform tibial component (cemented), 32 mm medialized dome patella (cemented), and a 12 mm stabilized rotating platform polyethylene insert.    Discharge Exam: Blood pressure (!) 135/52, pulse 83, temperature 98.7 F (37.1 C), temperature source Oral, resp. rate 16, height 4\' 9"  (1.448 m), weight 68 kg (149 lb 14.4 oz), SpO2 95 %.   Disposition:    Allergies as of 08/09/2017       Reactions   Pollen Extract Other (See Comments)   Nose runs, eyes itch, etc      Medication List    STOP taking these medications   meloxicam 7.5 MG tablet Commonly known as:  MOBIC     TAKE these medications   acetaminophen 325 MG tablet Commonly known as:  TYLENOL Take 650 mg by mouth every 6 (six) hours as needed for moderate pain or headache.   aspirin EC 81 MG tablet Take 81 mg by mouth daily.   atorvastatin 10 MG tablet Commonly known as:  LIPITOR Take 5 mg by mouth daily. In the morning   BIOFREEZE EX Apply 1 application topically as needed (for knee/leg pain).   calcium carbonate 1250 (500 Ca) MG chewable tablet Commonly known as:  OS-CAL Chew 1 tablet by mouth daily. As needed for heartburn   CALCIUM CITRATE PLUS/MAGNESIUM Tabs Take 2 capsules by mouth daily.   celecoxib 200 MG capsule Commonly known as:  CELEBREX Take 1 capsule (200 mg total) by mouth 2 (two) times daily.   diclofenac sodium 1 % Gel Commonly known as:  VOLTAREN Apply 2 g topically 4 (four) times daily as needed (pain). Apply to painful joint   donepezil 5 MG tablet Commonly known as:  ARICEPT Take 5 mg by mouth daily. In the morning   enoxaparin 40 MG/0.4ML injection Commonly known as:  LOVENOX Inject 0.4 mLs (40 mg total) into the skin daily.   folic acid 1 MG tablet Commonly known as:  FOLVITE Take 800 mcg by mouth daily.   gabapentin 300 MG capsule Commonly known as:  NEURONTIN Take 1 capsule (300 mg  total) by mouth at bedtime.   GENTEAL TEARS 0.1-0.2-0.3 % Soln Place 1 drop into both eyes daily as needed (dry eye).   guaiFENesin 600 MG 12 hr tablet Commonly known as:  MUCINEX Take 600 mg by mouth as needed for cough.   lisinopril 5 MG tablet Commonly known as:  PRINIVIL,ZESTRIL Take 5 mg by mouth daily. In the morning   OCUVITE PO Take 1 tablet by mouth daily.   OSTEO BI-FLEX TRIPLE STRENGTH Tabs Take 1 tablet by mouth daily.   GLUCOSAMINE CHOND COMPLEX/MSM  PO Take 1 tablet by mouth daily.   oxybutynin 10 MG 24 hr tablet Commonly known as:  DITROPAN-XL Take 10 mg by mouth daily.   oxyCODONE 5 MG immediate release tablet Commonly known as:  Oxy IR/ROXICODONE Take 1 tablet (5 mg total) by mouth every 4 (four) hours as needed for moderate pain (pain score 4-6).   pantoprazole 40 MG tablet Commonly known as:  PROTONIX Take 40 mg by mouth daily.   potassium chloride SA 20 MEQ tablet Commonly known as:  K-DUR,KLOR-CON Take 20 mEq by mouth once.   ranitidine 150 MG tablet Commonly known as:  ZANTAC Take 150 mg by mouth daily as needed for heartburn.   SOMBRA WARM THERAPY 3-3 % Gel Generic drug:  Menthol-Camphor Apply 1 application topically as needed.   sulfaSALAzine 500 MG tablet Commonly known as:  AZULFIDINE Take 1,000 mg by mouth 2 (two) times daily.   SUPER B COMPLEX PO Take 1 tablet by mouth daily.   traMADol 50 MG tablet Commonly known as:  ULTRAM Take 1-2 tablets (50-100 mg total) by mouth every 4 (four) hours as needed for moderate pain.   TRIPLE OMEGA-3-6-9 Caps Take 1 capsule by mouth daily.   Vitamin B-12 5000 MCG Tbdp Take 5,000 mcg by mouth daily.   Vitamin D3 5000 units Caps Take 5,000 Units by mouth daily.            Durable Medical Equipment  (From admission, onward)        Start     Ordered   08/06/17 1212  DME Walker rolling  Once    Question:  Patient needs a walker to treat with the following condition  Answer:  Total knee replacement status   08/06/17 1215   08/06/17 1212  DME Bedside commode  Once    Question:  Patient needs a bedside commode to treat with the following condition  Answer:  Total knee replacement status   08/06/17 1215      Contact information for follow-up providers    Watt Climes, PA On 08/21/2017.   Specialty:  Physician Assistant Why:  at 9:15am Contact information: [place] [address] [phone]         Dereck Leep, MD On 09/18/2017.   Specialty:  Orthopedic Surgery Why:  at 10:45am Contact information: [address] [facility] [place] [address] [phone]            Contact information for after-discharge care    Destination    [facility] .   Service:  Skilled Nursing Contact information: [address] [place] [phone]                  Signed: Prescott Parma, Mckinzie Saksa 08/09/2017, 6:49 AM   Objective: Vital signs in last 24 hours: Temp:  [98.6 F (37 C)-98.7 F (37.1 C)] 98.7 F (37.1 C) (06/05 2337) Pulse  Rate:  [83-106] 83 (06/05 2337) Resp:  [16-18] 16 (06/05 2337) BP: (135-144)/(52-67) 135/52 (06/05 2337) SpO2:  [95 %-97 %] 95 % (06/05 2337)  Intake/Output from previous day:  Intake/Output Summary (Last 24 hours) at 08/09/2017 0649 Last data filed at 08/08/2017 2232 Gross per 24 hour  Intake 1200 ml  Output -  Net 1200 ml    Intake/Output this shift: Total I/O In: 240 [P.O.:240] Out: -   Labs: Recent Labs    08/07/17 0546 08/08/17 0423  HGB 9.2* 8.2*   Recent Labs    08/07/17 0546 08/08/17 0423  WBC 5.2 4.5  RBC 2.76* 2.52*  HCT 26.7* 24.6*  PLT 128* 123*   Recent Labs    08/07/17 0546 08/08/17 0423  NA 140 141  K 3.8 3.7  CL 112* 111  CO2 21* 24  BUN 11 15  CREATININE 0.90 0.90  GLUCOSE 102* 92  CALCIUM 8.2* 8.4*   No results for input(s): LABPT, INR in the last 72 hours.  EXAM: General - Patient is Alert and Oriented Extremity - Sensation intact distally Dorsiflexion/Plantar flexion intact Compartment soft Incision - clean, dry, no drainage Motor Function -plantarflexion and dorsiflexion are intact.  Able to do a straight leg raise.  Assessment/Plan: 3 Days Post-Op Procedure(s) (LRB): COMPUTER ASSISTED TOTAL KNEE ARTHROPLASTY (Left) Procedure(s) (LRB): COMPUTER ASSISTED TOTAL KNEE ARTHROPLASTY (Left) Past Medical History:  Diagnosis Date  . Allergy    . Anemia   . Arthritis    rheumatoid /knees/hands/shoulders (Right shoulder) infusion every 5 weeks  . Bladder incontinence   . Chicken pox   . Dyspnea    with exertion or far walking  . Gastric ulcer   . GERD (gastroesophageal reflux disease)   . Heart murmur   . Hypercholesteremia   . Hyperlipidemia, unspecified   . Hypertension    controlled with meds;   Marland Kitchen MDS (myelodysplastic syndrome) ([place]) 2000  . Memory deficit   . Muscular dystrophy ([place])    per patient, this is incorrect  . Myelodysplastic syndrome ([place])   . Myelodysplastic syndrome ([place])   . Rheumatoid arthritis ([place])   . Ulcer    Active Problems:   S/P total knee arthroplasty  Estimated body mass index is 32.44 kg/m as calculated from the following:   Height as of this encounter: 4\' 9"  (1.448 m).   Weight as of this encounter: 68 kg (149 lb 14.4 oz). Up with therapy Discharge to SNF Diet - Regular diet Follow up - in 2 weeks Activity - WBAT Disposition - Rehab Condition Upon Discharge - Stable DVT Prophylaxis - Lovenox and TED hose  Reche Dixon, PA-C Orthopaedic Surgery 08/09/2017, 6:49 AM

## 2017-08-09 NOTE — Care Management Important Message (Signed)
Important Message  Patient Details  Name: Sylvia Miller MRN: [id number] Date of Birth: [birth year]   Medicare Important Message Given:  Yes    [name] A Sanaai Doane 08/09/2017, 11:03 AM

## 2017-08-14 ENCOUNTER — Other Ambulatory Visit
Admission: RE | Admit: 2017-08-14 | Discharge: 2017-08-14 | Disposition: A | Discharge status: Home / Self Care | Payer: Medicare Other | Source: Other Acute Inpatient Hospital | Attending: Internal Medicine | Admitting: Internal Medicine

## 2017-08-14 DIAGNOSIS — D649 Anemia, unspecified: Secondary | ICD-10-CM | POA: Insufficient documentation

## 2017-08-14 LAB — CBC WITH DIFFERENTIAL/PLATELET
BASOS ABS: 0 10*3/uL (ref 0–0.1)
BASOS PCT: 0 %
EOS ABS: 0.2 10*3/uL (ref 0–0.7)
EOS PCT: 7 %
HCT: 23.7 % — ABNORMAL LOW (ref 35.0–47.0)
Hemoglobin: 7.9 g/dL — ABNORMAL LOW (ref 12.0–16.0)
Lymphocytes Relative: 37 %
Lymphs Abs: 1.2 10*3/uL (ref 1.0–3.6)
MCH: 32.9 pg (ref 26.0–34.0)
MCHC: 33.4 g/dL (ref 32.0–36.0)
MCV: 98.4 fL (ref 80.0–100.0)
MONO ABS: 0.4 10*3/uL (ref 0.2–0.9)
Monocytes Relative: 14 %
Neutro Abs: 1.3 10*3/uL — ABNORMAL LOW (ref 1.4–6.5)
Neutrophils Relative %: 42 %
PLATELETS: 173 10*3/uL (ref 150–440)
RBC: 2.41 MIL/uL — ABNORMAL LOW (ref 3.80–5.20)
RDW: 15.4 % — AB (ref 11.5–14.5)
WBC: 3.2 10*3/uL — ABNORMAL LOW (ref 3.6–11.0)

## 2017-08-14 LAB — COMPREHENSIVE METABOLIC PANEL
ALBUMIN: 3.1 g/dL — AB (ref 3.5–5.0)
ALT: 11 U/L — ABNORMAL LOW (ref 14–54)
ANION GAP: 7 (ref 5–15)
AST: 20 U/L (ref 15–41)
Alkaline Phosphatase: 92 U/L (ref 38–126)
BUN: 18 mg/dL (ref 6–20)
CHLORIDE: 106 mmol/L (ref 101–111)
CO2: 24 mmol/L (ref 22–32)
Calcium: 8.4 mg/dL — ABNORMAL LOW (ref 8.9–10.3)
Creatinine, Ser: 0.77 mg/dL (ref 0.44–1.00)
GFR calc Af Amer: >60 mL/min (ref >60)
GFR calc non Af Amer: >60 mL/min (ref >60)
GLUCOSE: 80 mg/dL (ref 65–99)
POTASSIUM: 4.5 mmol/L (ref 3.5–5.1)
Sodium: 137 mmol/L (ref 135–145)
Total Bilirubin: 0.4 mg/dL (ref 0.3–1.2)
Total Protein: 5.8 g/dL — ABNORMAL LOW (ref 6.5–8.1)

## 2017-08-17 ENCOUNTER — Other Ambulatory Visit: Payer: Self-pay

## 2017-08-17 MED ORDER — OXYCODONE HCL 5 MG PO TABS: 5.0000 mg | ORAL_TABLET | ORAL | 0 refills | Status: DC | PRN

## 2017-08-17 NOTE — Telephone Encounter (Signed)
Rx sent to [facility] phone : [phone] , fax : [phone]  

## 2017-08-22 ENCOUNTER — Non-Acute Institutional Stay (SKILLED_NURSING_FACILITY): Payer: Medicare Other | Admitting: Gerontology

## 2017-08-22 ENCOUNTER — Encounter: Payer: Self-pay | Admitting: Gerontology

## 2017-08-22 DIAGNOSIS — M1712 Unilateral primary osteoarthritis, left knee: Secondary | ICD-10-CM | POA: Diagnosis not present

## 2017-08-22 DIAGNOSIS — E46 Unspecified protein-calorie malnutrition: Secondary | ICD-10-CM

## 2017-08-22 DIAGNOSIS — Z96652 Presence of left artificial knee joint: Secondary | ICD-10-CM

## 2017-08-22 NOTE — Progress Notes (Signed)
Location:   [place] Room Number: [place] of Service:  SNF [phone]) Provider:  Toni Arthurs, NP-C  Dion Body, MD  Patient Care Team: Dion Body, MD as PCP - General [facility])  Extended Emergency Contact Information Primary Emergency Contact: Cimino,Shirley Address: [address]          [place], [place] [address] [place]nette Litter of [place] Phone: [phone] Mobile Phone: [phone] Relation: Daughter Secondary Emergency Contact: Burrous,Janice Home Phone: [phone] Relation: Daughter  Code Status:  FULL Goals of care: Advanced Directive information Advanced Directives 08/22/2017  Does Patient Have a Medical Advance Directive? No  Would patient like information on creating a medical advance directive? No - Patient declined     Chief Complaint  Patient presents with  . Medical Management of Chronic Issues    Routine Visit    HPI:  Pt is a 77 y.o. female seen today for medical management of chronic diseases.  Patient was admitted to the facility for rehab following hospitalization at [facility] for a left total knee arthroplasty.  Patient has been participating in PT/OT.  Patient reports pain is well controlled on current regimen.  Patient was found to have mild protein calorie malnutrition on assessment of labs.  Supplementation initiated.  Patient seems to be tolerating well.  Patient reports her appetite is fair.  She is voiding well and having regular BMs.  Patient continues to use Polar Care and oxycodone for knee pain.  Patient plans to discharge later on in the week with outpatient physical therapy through [facility].  Patient seems to be slightly confused about minor details at times.  Husband at bedside, does not express concern over this.  Vital signs stable.  No other complaints.     Past Medical History:  Diagnosis Date  . Allergy   . Anemia   . Arthritis    rheumatoid /knees/hands/shoulders (Right shoulder)  infusion every 5 weeks  . Bladder incontinence   . Chicken pox   . Dyspnea    with exertion or far walking  . Gastric ulcer   . GERD (gastroesophageal reflux disease)   . Heart murmur   . Hypercholesteremia   . Hyperlipidemia, unspecified   . 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 Memory deficit   . Muscular dystrophy ([place])    per patient, this is incorrect  . Myelodysplastic syndrome ([place])   . Myelodysplastic syndrome ([place])   . Rheumatoid arthritis ([place])   . Ulcer    Past Surgical History:  Procedure Laterality Date  . ABDOMINAL HYSTERECTOMY    . APPENDECTOMY     as a child  . CHOLECYSTECTOMY    . COLONOSCOPY    . COLONOSCOPY WITH PROPOFOL N/A 04/24/2017   Procedure: COLONOSCOPY WITH PROPOFOL;  Surgeon: Lollie Sails, MD;  Location: [facility] ENDOSCOPY;  Service: Endoscopy;  Laterality: N/A;  . ESOPHAGOGASTRODUODENOSCOPY (EGD) WITH PROPOFOL N/A 04/24/2017   Procedure: ESOPHAGOGASTRODUODENOSCOPY (EGD) WITH PROPOFOL;  Surgeon: Lollie Sails, MD;  Location: [facility] ENDOSCOPY;  Service: Endoscopy;  Laterality: N/A;  . JOINT REPLACEMENT    . KNEE ARTHROPLASTY Right 02/21/2017   Procedure: COMPUTER ASSISTED TOTAL KNEE ARTHROPLASTY;  Surgeon: Dereck Leep, MD;  Location: [facility] ORS;  Service: Orthopedics;  Laterality: Right;  . KNEE ARTHROPLASTY Left 08/06/2017   Procedure: COMPUTER ASSISTED TOTAL KNEE ARTHROPLASTY;  Surgeon: Dereck Leep, MD;  Location: [facility] ORS;  Service: Orthopedics;  Laterality: Left;  . NM  INTRAVENOUS INJECTION ([place] HX)     receives biologic infusions every 5 weeks for rheumatoid arthritis. followed by dr. Meda Coffee at [facility]  . TONSILLECTOMY    . UPPER GASTROINTESTINAL ENDOSCOPY    . WISDOM TOOTH EXTRACTION      Allergies  Allergen Reactions  . Pollen Extract Other (See Comments)    Nose runs, eyes itch, etc    Allergies as of 08/22/2017      Reactions   Pollen Extract Other (See Comments)    Nose runs, eyes itch, etc      Medication List        Accurate as of 08/22/17  3:41 PM. Always use your most recent med list.          acetaminophen 325 MG tablet Commonly known as:  TYLENOL Take 650 mg by mouth every 6 (six) hours as needed for moderate pain or headache.   aspirin EC 81 MG tablet Take 81 mg by mouth daily.   atorvastatin 10 MG tablet Commonly known as:  LIPITOR Take 5 mg by mouth daily. In the morning   BIOFREEZE EX Apply 1 application topically as needed (for knee/leg pain).   calcium carbonate 1250 (500 Ca) MG chewable tablet Commonly known as:  OS-CAL Chew 1 tablet by mouth daily. As needed for heartburn   celecoxib 200 MG capsule Commonly known as:  CELEBREX Take 1 capsule (200 mg total) by mouth 2 (two) times daily.   diclofenac sodium 1 % Gel Commonly known as:  VOLTAREN Apply 2 g topically 4 (four) times daily as needed (pain). Apply to painful joint   donepezil 10 MG tablet Commonly known as:  ARICEPT Take 10 mg by mouth at bedtime.   enoxaparin 40 MG/0.4ML injection Commonly known as:  LOVENOX Inject 0.4 mLs (40 mg total) into the skin daily.   ENSURE ENLIVE PO Take 1 Bottle by mouth 2 (two) times daily.   feeding supplement (PRO-STAT SUGAR FREE 64) Liqd Take 30 mLs by mouth 2 (two) times daily.   ferrous sulfate 325 (65 FE) MG tablet Take 325 mg by mouth 2 (two) times daily. Anemia. Give at least 2 hours apart from Ensure or Calcium   FISH OIL BURP-LESS 1200 MG Caps Take 1 capsule by mouth daily.   folic acid 778 MCG tablet Commonly known as:  FOLVITE Take 400 mcg by mouth daily.   gabapentin 300 MG capsule Commonly known as:  NEURONTIN Take 1 capsule (300 mg total) by mouth at bedtime.   GENTEAL TEARS 0.1-0.2-0.3 % Soln Place 1 drop into both eyes daily as needed (dry eye).   guaiFENesin 600 MG 12 hr tablet Commonly known as:  MUCINEX Take 600 mg by mouth as needed for cough.   lisinopril 5 MG tablet Commonly known  as:  PRINIVIL,ZESTRIL Take 5 mg by mouth daily. In the morning   OCUVITE PO Take 1 tablet by mouth daily.   OSTEO BI-FLEX TRIPLE STRENGTH Tabs Take 1 tablet by mouth daily.   GLUCOSAMINE CHOND COMPLEX/MSM PO Take 1 tablet by mouth daily.   oxybutynin 10 MG 24 hr tablet Commonly known as:  DITROPAN-XL Take 10 mg by mouth daily.   oxyCODONE 5 MG immediate release tablet Commonly known as:  Oxy IR/ROXICODONE Take 1 tablet (5 mg total) by mouth every 4 (four) hours as needed for moderate pain (pain score 4-6).   pantoprazole 40 MG tablet Commonly known as:  PROTONIX Take 40 mg by mouth daily.   ranitidine 150 MG  tablet Commonly known as:  ZANTAC Take 150 mg by mouth daily as needed for heartburn.   sulfaSALAzine 500 MG tablet Commonly known as:  AZULFIDINE Take 1,000 mg by mouth 2 (two) times daily.   SUPER B COMPLEX PO Take 1 tablet by mouth daily.   traMADol 50 MG tablet Commonly known as:  ULTRAM Take 1-2 tablets (50-100 mg total) by mouth every 4 (four) hours as needed for moderate pain.   Vitamin B-12 5000 MCG Tbdp Take 5,000 mcg by mouth daily.   vitamin C 500 MG tablet Commonly known as:  ASCORBIC ACID Take 500 mg by mouth 2 (two) times daily. give in addition to ferrous sulfate to potentiate iron absorption. for anemia   Vitamin D3 5000 units Caps Take 5,000 Units by mouth daily.       Review of Systems  Constitutional: Negative for activity change, appetite change, chills, diaphoresis and fever.  HENT: Negative for congestion, mouth sores, nosebleeds, postnasal drip, sneezing, sore throat, trouble swallowing and voice change.   Respiratory: Negative for apnea, cough, choking, chest tightness, shortness of breath and wheezing.   Cardiovascular: Negative for chest pain, palpitations and leg swelling.  Gastrointestinal: Negative for abdominal distention, abdominal pain, constipation, diarrhea and nausea.  Genitourinary: Negative for difficulty urinating,  dysuria, frequency and urgency.  Musculoskeletal: Positive for arthralgias (typical arthritis) and gait problem. Negative for back pain and myalgias.  Skin: Positive for wound. Negative for color change, pallor and rash.  Neurological: Positive for weakness. Negative for dizziness, tremors, syncope, speech difficulty, numbness and headaches.  Psychiatric/Behavioral: Negative for agitation and behavioral problems.  All other systems reviewed and are negative.   Immunization History  Administered Date(s) Administered  . Influenza-Unspecified 11/11/2013, 01/18/2015, 11/04/2015  . Tdap 08/17/2016   Pertinent  Health Maintenance Due  Topic Date Due  . DEXA SCAN  05/24/2005  . PNA vac Low Risk Adult (1 of 2 - PCV13) 05/24/2005  . INFLUENZA VACCINE  10/04/2017   No flowsheet data found. Functional Status Survey:    Vitals:   08/22/17 1514  BP: (!) 150/46  Pulse: 88  Resp: 18  Temp: 98.1 F (36.7 C)  TempSrc: Oral  SpO2: 98%  Weight: 149 lb 14.4 oz (68 kg)  Height: 4\' 9"  (1.448 m)   Body mass index is 32.44 kg/m. Physical Exam  Constitutional: She is oriented to person, place, and time. Vital signs are normal. She appears well-developed and well-nourished. She is active and cooperative. She does not appear ill. No distress.  HENT:  Head: Normocephalic and atraumatic.  Mouth/Throat: Uvula is midline, oropharynx is clear and moist and mucous membranes are normal. Mucous membranes are not pale, not dry and not cyanotic.  Eyes: Pupils are equal, round, and reactive to light. Conjunctivae, EOM and lids are normal.  Neck: Trachea normal, normal range of motion and full passive range of motion without pain. Neck supple. No JVD present. No tracheal deviation, no edema and no erythema present. No thyromegaly present.  Cardiovascular: Normal rate, regular rhythm, normal heart sounds, intact distal pulses and normal pulses. Exam reveals no gallop, no distant heart sounds and no friction rub.   No murmur heard. Pulses:      Dorsalis pedis pulses are 2+ on the right side, and 2+ on the left side.  No edema  Pulmonary/Chest: Effort normal and breath sounds normal. No accessory muscle usage. No respiratory distress. She has no decreased breath sounds. She has no wheezes. She has no rhonchi. She has no  rales. She exhibits no tenderness.  Abdominal: Soft. Normal appearance and bowel sounds are normal. She exhibits no distension and no ascites. There is no tenderness.  Musculoskeletal: She exhibits no edema.       Left knee: She exhibits decreased range of motion, swelling and laceration. Tenderness found.  Expected osteoarthritis, stiffness; Bilateral Calves soft, supple. Negative Homan's Sign. B- pedal pulses equal  Neurological: She is alert and oriented to person, place, and time. She has normal strength.  Skin: Skin is warm and dry. Laceration noted. She is not diaphoretic. No cyanosis. No pallor. Nails show no clubbing.  Psychiatric: She has a normal mood and affect. Her speech is normal and behavior is normal. Judgment and thought content normal. Cognition and memory are impaired.  Nursing note and vitals reviewed.   Labs reviewed: Recent Labs    08/07/17 0546 08/08/17 0423 08/14/17 0620  NA 140 141 137  K 3.8 3.7 4.5  CL 112* 111 106  CO2 21* 24 24  GLUCOSE 102* 92 80  BUN 11 15 18   CREATININE 0.90 0.90 0.77  CALCIUM 8.2* 8.4* 8.4*   Recent Labs    02/08/17 1443 07/24/17 1054 08/14/17 0620  AST 25 22 20   ALT 16 12* 11*  ALKPHOS 127* 115 92  BILITOT 0.7 0.7 0.4  PROT 6.8 7.2 5.8*  ALBUMIN 4.1 4.2 3.1*   Recent Labs    08/07/17 0546 08/08/17 0423 08/14/17 0620  WBC 5.2 4.5 3.2*  NEUTROABS  --   --  1.3*  HGB 9.2* 8.2* 7.9*  HCT 26.7* 24.6* 23.7*  MCV 96.8 97.6 98.4  PLT 128* 123* 173   No results found for: TSH No results found for: HGBA1C No results found for: CHOL, HDL, LDLCALC, LDLDIRECT, TRIG, CHOLHDL  Significant Diagnostic Results in last 30  days:  Dg Knee Left Port  Result Date: 08/06/2017 CLINICAL DATA:  77 year old female status post left knee arthroplasty. EXAM: PORTABLE LEFT KNEE - 1-2 VIEW COMPARISON:  Report of [facility] knee series 06/06/2016 (no images available). FINDINGS: AP and cross-table lateral views of the left knee demonstrate total arthroplasty hardware in place. Two suprapatellar approach surgical drains are in place. The hardware appears intact and normally aligned. Postoperative changes to the patella. Several cortical lucencies within the distal femur and proximal tibia such as from external hardware or ex fix placement. Anterior skin staples in place. IMPRESSION: Left total knee arthroplasty with no adverse features. Electronically Signed   By: Genevie Ann M.D.   On: 08/06/2017 11:37    Assessment/Plan:  Primary osteoarthritis of left knee  Status post total left knee replacement  Protein-calorie malnutrition, unspecified severity (HCC)   Continue PT/OT  Continue exercises as taught by PT/OT  Continue tramadol 50 mg 1 to 2 tablets p.o. every 4 hours as needed pain  Continue oxycodone 5 mg 1 tablet p.o. every 4 hours as needed pain  Continue Tylenol 650 mg p.o. every 6 hours as needed pain   Continue Polar Care continuous  Thigh-high TED hose  Elevate legs when at rest  Continue Lovenox 40 mg subcu every 24 hours  Continue ferrous sulfate 325 mg p.o. twice daily  Continue Protostat 30 mL p.o. twice daily  Continue Ensure Enlive 1 bottle p.o. twice daily for nutritional supplementation  Continue daily multivitamin  Skin care per protocol  Follow-up with orthopedist as instructed   Family/ staff Communication:   Total Time:  Documentation:  Face to Face:  Family/Phone:   Labs/tests ordered:  Medication list reviewed and assessed for continued appropriateness. Monthly medication orders reviewed and signed.  Vikki Ports, NP-C [facility] [phone], [place] [address] Cell Phone (Mon-Fri 8am-5pm):  [phone] On Call:  [phone] & follow prompts after 5pm & weekends Office Phone:  [phone] Office Fax:  [phone]

## 2017-08-27 DIAGNOSIS — E46 Unspecified protein-calorie malnutrition: Secondary | ICD-10-CM | POA: Insufficient documentation

## 2017-12-06 ENCOUNTER — Other Ambulatory Visit: Payer: Self-pay | Admitting: Family Medicine

## 2017-12-06 DIAGNOSIS — R9389 Abnormal findings on diagnostic imaging of other specified body structures: Secondary | ICD-10-CM

## 2017-12-26 ENCOUNTER — Ambulatory Visit
Admission: RE | Admit: 2017-12-26 | Discharge: 2017-12-26 | Disposition: A | Discharge status: Home / Self Care | Payer: Medicare Other | Source: Ambulatory Visit | Attending: Family Medicine | Admitting: Family Medicine

## 2017-12-26 DIAGNOSIS — R9389 Abnormal findings on diagnostic imaging of other specified body structures: Secondary | ICD-10-CM | POA: Insufficient documentation

## 2017-12-26 DIAGNOSIS — I700 Atherosclerosis of aorta: Secondary | ICD-10-CM | POA: Insufficient documentation

## 2018-04-30 ENCOUNTER — Ambulatory Visit: Payer: Medicare Other | Attending: Internal Medicine

## 2018-04-30 ENCOUNTER — Other Ambulatory Visit: Payer: Self-pay

## 2018-04-30 DIAGNOSIS — R262 Difficulty in walking, not elsewhere classified: Secondary | ICD-10-CM | POA: Insufficient documentation

## 2018-04-30 DIAGNOSIS — M545 Low back pain, unspecified: Secondary | ICD-10-CM

## 2018-04-30 DIAGNOSIS — R2681 Unsteadiness on feet: Secondary | ICD-10-CM | POA: Diagnosis present

## 2018-04-30 DIAGNOSIS — M6281 Muscle weakness (generalized): Secondary | ICD-10-CM | POA: Insufficient documentation

## 2018-04-30 DIAGNOSIS — G8929 Other chronic pain: Secondary | ICD-10-CM | POA: Diagnosis present

## 2018-04-30 DIAGNOSIS — M546 Pain in thoracic spine: Secondary | ICD-10-CM | POA: Diagnosis present

## 2018-04-30 DIAGNOSIS — M5417 Radiculopathy, lumbosacral region: Secondary | ICD-10-CM | POA: Diagnosis present

## 2018-04-30 NOTE — Therapy (Signed)
[facility] [address] [place], [address] Phone: [phone]   Fax:  [phone]  Physical Therapy Evaluation  Patient Details  Name: Sylvia Miller MRN: [id number] Date of Birth: 1940-09-09 Referring Provider (PT): Marlowe Sax, MD   Encounter Date: 04/30/2018  PT End of Session - 04/30/18 1301    Visit Number  1    Number of Visits  13    Date for PT Re-Evaluation  06/13/18    Authorization Type  1    Authorization Time Period  of 10 progress report (from 04/30/2018)    PT Start Time  1301    PT Stop Time  1425    PT Time Calculation (min)  84 min    Activity Tolerance  Patient tolerated treatment well    Behavior During Therapy  [facility] for tasks assessed/performed       Past Medical History:  Diagnosis Date  . Allergy   . Anemia   . Arthritis    rheumatoid /knees/hands/shoulders (Right shoulder) infusion every 5 weeks  . Bladder incontinence   . Chicken pox   . Dyspnea    with exertion or far walking  . Gastric ulcer   . GERD (gastroesophageal reflux disease)   . Heart murmur   . Hypercholesteremia   . Hyperlipidemia, unspecified   . Hypertension    controlled with meds;   Marland Kitchen MDS (myelodysplastic syndrome) ([place]) 2000  . Memory deficit   . Muscular dystrophy ([place])    per patient, this is incorrect  . Myelodysplastic syndrome ([place])   . Myelodysplastic syndrome ([place])   . Rheumatoid arthritis ([place])   . Ulcer     Past Surgical History:  Procedure Laterality Date  . ABDOMINAL HYSTERECTOMY    . APPENDECTOMY     as a child  . CHOLECYSTECTOMY    . COLONOSCOPY    . COLONOSCOPY WITH PROPOFOL N/A 04/24/2017   Procedure: COLONOSCOPY WITH PROPOFOL;  Surgeon: Lollie Sails, MD;  Location: [facility] ENDOSCOPY;  Service: Endoscopy;  Laterality: N/A;  . ESOPHAGOGASTRODUODENOSCOPY (EGD) WITH PROPOFOL N/A 04/24/2017   Procedure: ESOPHAGOGASTRODUODENOSCOPY (EGD) WITH PROPOFOL;  Surgeon:  Lollie Sails, MD;  Location: [facility] ENDOSCOPY;  Service: Endoscopy;  Laterality: N/A;  . JOINT REPLACEMENT    . KNEE ARTHROPLASTY Right 02/21/2017   Procedure: COMPUTER ASSISTED TOTAL KNEE ARTHROPLASTY;  Surgeon: Dereck Leep, MD;  Location: [facility] ORS;  Service: Orthopedics;  Laterality: Right;  . KNEE ARTHROPLASTY Left 08/06/2017   Procedure: COMPUTER ASSISTED TOTAL KNEE ARTHROPLASTY;  Surgeon: Dereck Leep, MD;  Location: [facility] ORS;  Service: Orthopedics;  Laterality: Left;  . NM INTRAVENOUS INJECTION ([facility] HX)     receives biologic infusions every 5 weeks for rheumatoid arthritis. followed by dr. Meda Coffee at [facility]  . TONSILLECTOMY    . UPPER GASTROINTESTINAL ENDOSCOPY    . WISDOM TOOTH EXTRACTION      There were no vitals filed for this visit.   Subjective Assessment - 04/30/18 1311    Subjective  Mid to low back pain: 8/10 currently (pt sitting), 8/10 at worst for the past 2 months (pt demonstrates difficulty with pain scale); L lateral leg pain: 9/10 currently, 10/10 at worst.  L knee joint:  9/10 currently and 10/10 at worst.     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 Pt had L TKA 08/05/2017. Last 3-4 months pt has been having pain L lateral leg. Pt also states feeling  pain in her upper back  including her B shoulders. Mid back pain travels down her spine to her low back and feel radiating pain down her L lateral leg. R LE seems fine.   Denies loss of bowel or bladder control, or saddle anesthesia.  No falls within the last 6 months.  Low back and L lateral leg pain seems to have increased since onset.    Difficulty with history. Husband assist   Patient Stated Goals  Walk more comfortably. Stand longer than a minute at church.    Currently in Pain?  Yes    Pain Score  7     Pain Location  --   Thoracic and low back, L LE, L knee   Pain Orientation  Left    Pain Descriptors / Indicators  Constant    Pain Type  Chronic pain    Pain Onset   More than a month ago    Pain Frequency  Constant    Aggravating Factors   turning to the R  > L, prolonged walking, standing for greater than 1 minute    Pain Relieving Factors  pushing her back against the chair seat to extend it helps a little, ice to L knee,          [facility] PT Assessment - 04/30/18 1330      Assessment   Referring Provider (PT)  Marlowe Sax, MD    Onset Date/Surgical Date  04/24/18   Date PT referral signed. Chronic condition   Hand Dominance  Right    Prior Therapy  Pt participated in prior PT with good results      Precautions   Precaution Comments  possible fall risk      Restrictions   Other Position/Activity Restrictions  no known weight bearing restrictions      Balance Screen   Has the patient fallen in the past 6 months  No    Has the patient had a decrease in activity level because of a fear of falling?   Yes   more careful   Is the patient reluctant to leave their home because of a fear of falling?   No      Home Environment   Additional Comments  Pt lives in a 1 story home with a bonus room pt does not go up to. Lives with her husband. 20 steps to go up, L rail.  No steps to enter the front or back door.       Prior Function   Vocation  Retired    Biomedical scientist  PLOF: Better able to walk and stand more comfortably with less pain      Observation/Other Assessments   Observations  Decreased B femoral control with sit <> stand. Decreased trunk and pelvic control observed with movements      Posture/Postural Control   Posture Comments  B protracted shoulders and neck, slight thoracic kyphosis, slight R trunk rotation, slight R lateral shift, decreased B hip extension       AROM   Left Knee Extension  0    Left Knee Flexion  102   121 AAROM. Both A/AROM with hamstring cramp   Lumbar Flexion  WFL   performed in sitting   Lumbar Extension  WFL with L trunk rotation. Reproduction of L LE symptoms at L5 dermatome (thigh and  lateral leg)    movement preference around L 4/5   Lumbar - Right Side [facility] with L LE symptoms  around L5/S1 dermatome with feeling of B leg weakness.     Lumbar - Left Side [facility] with increased L leg pain worse that R side bending.    Lumbar - Right Rotation  WFL with increased L lateral leg pain   performed in sitting   Lumbar - Left Rotation  WFL with L lateral leg stretch feeling   performed in sitting     Strength   Right Hip Flexion  4-/5    Right Hip Extension  4-/5   seated manually resisted   Right Hip ABduction  4-/5   Seated clamshell   Left Hip Flexion  4-/5    Left Hip Extension  4-/5   seated manually resisted   Left Hip ABduction  4-/5   Seated clamshell   Right Knee Flexion  4/5    Right Knee Extension  4+/5    Left Knee Flexion  4-/5   with L lateral knee pain   Left Knee Extension  4+/5      Palpation   Palpation comment  Muscle tension lumbar paraspinal muscles      Ambulation/Gait   Gait Comments  antalgic, decreased stance R LE, SPC on R side, trendelenberg, lateral lean.                 Objective measurements completed on examination: See above findings.    Medbridge Access Code:     Blood pressure L arm sitting, mechanically taken, normal cuff: 126/52, HR 76   Therapeutic exercises  Seated glute max squeeze 10x5 seconds for 2 sets  Seated naval ins 10x5 seconds  Seated B scapular retraction 10x5 seconds. Possible decreased in leg pain but unclear   Seated hip extensoin isometrics   R LE 10x5 seconds  L LE 10x5 seconds   Difficulty with pt feedback with exercise response   Improved exercise technique, movement at target joints, use of target muscles after mod to max verbal, visual, tactile cues.    Pt is a 78 year old female who came to physical therapy secondary to mid to low back pain with L LE radiating symptoms. She also presents with altered gait pattern and posture, poor trunk, pelvic and femoral control, B LE  weakness, L knee and lateral leg pain, decreased balance, and difficulty performing functional tasks such as walking and difficulty maintaining positions such as standing due to pain. Pt will benefit from skilled physical therapy services to address the aforementioned deficits.            PT Education - 04/30/18 1509    Education provided  Yes    Education Details  ther-ex, plan of care, L 5 nerve    Person(s) Educated  Patient;Spouse    Methods  Explanation;Demonstration;Tactile cues;Verbal cues    Comprehension  Verbalized understanding;Returned demonstration;Verbal cues required;Tactile cues required;Need further instruction       PT Short Term Goals - 04/30/18 1451      PT SHORT TERM GOAL #1   Title  Patient will be independent with her HEP to improve strength, decrease pain, improve function    Time  3    Period  Weeks    Status  New    Target Date  05/23/18        PT Long Term Goals - 04/30/18 1452      PT LONG TERM GOAL #1   Title  Patient will have a decrease in mid to low back pain to 5/10 or less at worst  to promote ability to ambulate more comfortably and tolerate standing longer.     Baseline  8/10 back pain at worst for the past 2 months (possible greater than 8/10 at worst based on pt reports but pt seems to have difficulty with the pain scale; 04/30/2018)    Time  6    Period  Weeks    Status  New    Target Date  06/13/18      PT LONG TERM GOAL #2   Title  Pt will have a decrease in L lateral leg pain to 5/10 or less at worst  to promote ability to ambulate more comfortably and tolerate standing longer.     Baseline  10/10 at worst for the past 2 months (04/30/2018)    Time  6    Period  Weeks    Status  New    Target Date  06/13/18      PT LONG TERM GOAL #3   Title  Patient will have a decrease in L knee joint pain to 5/10 or less at worst  to promote ability to ambulate more comfortably and tolerate standing longer.     Baseline  10/10 L knee pain at  worst for the past 2 months (04/30/2018)    Time  6    Period  Weeks    Status  New    Target Date  06/13/18      PT LONG TERM GOAL #4   Title  Pt will improve B LE strength by at least 1/2 MMT grade  to promote ability to ambulate more comfortably and tolerate standing longer.     Time  6    Period  Weeks    Status  New    Target Date  06/13/18      PT LONG TERM GOAL #5   Title  Pt will report being able to tolerate standing to 5 minutes or more to promote ability to stand at church as well as improve ability to perform chores.     Baseline  Pt able to tolerate standing 1 min per subjective reports. (04/30/2018)    Time  6    Period  Weeks    Status  New    Target Date  06/13/18             Plan - 04/30/18 1435    Clinical Impression Statement  Pt is a 78 year old female who came to physical therapy secondary to mid to low back pain with L LE radiating symptoms. She also presents with altered gait pattern and posture, poor trunk, pelvic and femoral control, B LE weakness, L knee and lateral leg pain, decreased balance, and difficulty performing functional tasks such as walking and difficulty maintaining positions such as standing due to pain. Pt will benefit from skilled physical therapy services to address the aforementioned deficits.     History and Personal Factors relevant to plan of care:  age, weakness, multiple areas of pain, medical history, difficulty providing feedback with treatment, difficulty walking, decreased balance, difficulty tolerating prolonged standing; good husband support    Clinical Presentation  Evolving    Clinical Presentation due to:  pain seems to be worsening based on pt interview    Clinical Decision Making  Moderate    Rehab Potential  Fair    Clinical Impairments Affecting Rehab Potential  Fall risk, age, co-morbidities, multiple areas of pain, LE weakness, decreased lumbar, pelvic, and femoral control    PT Frequency  2x / week    PT Duration  6  weeks    PT Treatment/Interventions  Aquatic Therapy;Cryotherapy;Moist Heat;Gait training;Stair training;Functional mobility training;Therapeutic activities;Therapeutic exercise;Patient/family education;Neuromuscular re-education;Balance training;Manual techniques;Taping;Energy conservation;Passive range of motion;Electrical Stimulation;Iontophoresis 4mg /ml Dexamethasone;Dry needling    PT Next Visit Plan  --    PT Home Exercise Plan  --    Consulted and Agree with Plan of Care  Patient;Family member/caregiver    Family Member Consulted  husband       Patient will benefit from skilled therapeutic intervention in order to improve the following deficits and impairments:  Abnormal gait, Pain, Postural dysfunction, Decreased strength, Difficulty walking, Improper body mechanics, Decreased balance  Visit Diagnosis: Chronic bilateral low back pain, unspecified whether sciatica present - Plan: PT plan of care cert/re-cert  Pain in thoracic spine - Plan: PT plan of care cert/re-cert  Radiculopathy, lumbosacral region - Plan: PT plan of care cert/re-cert  Muscle weakness (generalized) - Plan: PT plan of care cert/re-cert  Difficulty in walking, not elsewhere classified - Plan: PT plan of care cert/re-cert  Unsteadiness on feet - Plan: PT plan of care cert/re-cert     Problem List Patient Active Problem List   Diagnosis Date Noted  . Protein-calorie malnutrition ([place]) 08/27/2017  . 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 Cubital canal compression syndrome, right 04/18/2017  . Primary osteoarthritis of knee 03/25/2017  . 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 Pure hypercholesterolemia 08/17/2016  . MDS (myelodysplastic syndrome) ([place]) 05/12/2016  . B12 deficiency 05/04/2016  . Essential hypertension 05/04/2016  . Mixed stress and urge urinary incontinence 05/04/2016  . Rheumatoid arthritis involving multiple sites with positive rheumatoid factor ([place]) 05/04/2016     Joneen Boers PT, DPT   04/30/2018, 3:36 PM  [facility] [address] [place], [address] Phone: [phone]   Fax:  [phone]  Name: Sylvia Miller MRN: [id number] Date of Birth: 07-02-1940

## 2018-05-02 ENCOUNTER — Ambulatory Visit: Payer: Medicare Other

## 2018-05-02 DIAGNOSIS — R2681 Unsteadiness on feet: Secondary | ICD-10-CM

## 2018-05-02 DIAGNOSIS — M6281 Muscle weakness (generalized): Secondary | ICD-10-CM

## 2018-05-02 DIAGNOSIS — G8929 Other chronic pain: Secondary | ICD-10-CM

## 2018-05-02 DIAGNOSIS — M545 Low back pain: Secondary | ICD-10-CM

## 2018-05-02 DIAGNOSIS — M5417 Radiculopathy, lumbosacral region: Secondary | ICD-10-CM

## 2018-05-02 DIAGNOSIS — M546 Pain in thoracic spine: Secondary | ICD-10-CM

## 2018-05-02 DIAGNOSIS — R262 Difficulty in walking, not elsewhere classified: Secondary | ICD-10-CM

## 2018-05-02 NOTE — Therapy (Signed)
[facility] [address], [place], [address] Phone: [phone]   Fax:  [phone]  Physical Therapy Treatment  Patient Details  Name: Sylvia Miller MRN: [id number] Date of Birth: Oct 09, 1940 Referring Provider (PT): Marlowe Sax, MD   Encounter Date: 05/02/2018  PT End of Session - 05/02/18 1300    Visit Number  2    Number of Visits  13    Date for PT Re-Evaluation  06/13/18    Authorization Type  2    Authorization Time Period  of 10 progress report (from 04/30/2018)    PT Start Time  1300    PT Stop Time  1345    PT Time Calculation (min)  45 min    Activity Tolerance  Patient tolerated treatment well    Behavior During Therapy  [facility] for tasks assessed/performed       Past Medical History:  Diagnosis Date  . Allergy   . Anemia   . Arthritis    rheumatoid /knees/hands/shoulders (Right shoulder) infusion every 5 weeks  . Bladder incontinence   . Chicken pox   . Dyspnea    with exertion or far walking  . Gastric ulcer   . GERD (gastroesophageal reflux disease)   . Heart murmur   . Hypercholesteremia   . Hyperlipidemia, unspecified   . Hypertension    controlled with meds;   Marland Kitchen MDS (myelodysplastic syndrome) ([place]) 2000  . Memory deficit   . Muscular dystrophy ([place])    per patient, this is incorrect  . Myelodysplastic syndrome ([place])   . Myelodysplastic syndrome ([place])   . Rheumatoid arthritis ([place])   . Ulcer     Past Surgical History:  Procedure Laterality Date  . ABDOMINAL HYSTERECTOMY    . APPENDECTOMY     as a child  . CHOLECYSTECTOMY    . COLONOSCOPY    . COLONOSCOPY WITH PROPOFOL N/A 04/24/2017   Procedure: COLONOSCOPY WITH PROPOFOL;  Surgeon: Lollie Sails, MD;  Location: [facility] ENDOSCOPY;  Service: Endoscopy;  Laterality: N/A;  . ESOPHAGOGASTRODUODENOSCOPY (EGD) WITH PROPOFOL N/A 04/24/2017   Procedure: ESOPHAGOGASTRODUODENOSCOPY (EGD) WITH PROPOFOL;  Surgeon:  Lollie Sails, MD;  Location: [facility] ENDOSCOPY;  Service: Endoscopy;  Laterality: N/A;  . JOINT REPLACEMENT    . KNEE ARTHROPLASTY Right 02/21/2017   Procedure: COMPUTER ASSISTED TOTAL KNEE ARTHROPLASTY;  Surgeon: Dereck Leep, MD;  Location: [facility] ORS;  Service: Orthopedics;  Laterality: Right;  . KNEE ARTHROPLASTY Left 08/06/2017   Procedure: COMPUTER ASSISTED TOTAL KNEE ARTHROPLASTY;  Surgeon: Dereck Leep, MD;  Location: [facility] ORS;  Service: Orthopedics;  Laterality: Left;  . NM INTRAVENOUS INJECTION ([facility] HX)     receives biologic infusions every 5 weeks for rheumatoid arthritis. followed by dr. Meda Coffee at [place] [facility]  . TONSILLECTOMY    . UPPER GASTROINTESTINAL ENDOSCOPY    . WISDOM TOOTH EXTRACTION      There were no vitals filed for this visit.  Subjective Assessment - 05/02/18 1303    Subjective  Back is not too bad. Has been sitting a bit. L leg tingles if she touches it. Starts crying if she bumps her L knee.     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 Pt had L TKA 08/05/2017. Last 3-4 months pt has been having pain L lateral leg. Pt also states feeling pain in her upper back  including her B shoulders. Mid back pain travels down her spine to her  low back and feel radiating pain down her L lateral leg. R LE seems fine.   Denies loss of bowel or bladder control, or saddle anesthesia.  No falls within the last 6 months.  Low back and L lateral leg pain seems to have increased since onset.    Difficulty with history. Husband assist   Patient Stated Goals  Walk more comfortably. Stand longer than a minute at church.    Currently in Pain?  Yes    Pain Score  --   no pain level provided.    Pain Onset  More than a month ago                               PT Education - 05/02/18 1329    Education provided  Yes    Education Details  ther-ex    [employer]) Educated  Patient    Methods  Explanation;Demonstration;Tactile  cues;Verbal cues    Comprehension  Returned demonstration;Verbalized understanding        Objective     Medbridge Access Code:   Does not think she has latex allergies   Manual therapy  Seated STM L lateral hamstrings, vastus lateralis, and IT band to help decrease tension to the L knee  No L latearl leg tingling with gait afterwards      Therapeutic exercises   Seated L hip ER 5x4  Seated hip adduction ball and glute max squeeze 10x5 seconds for 2 sets  Seated hip extension isometrics  L 10x5 seconds for 2 sets  Seated LAQ 2 lbs   L LE 10x2   Seated clamshells, hips less than 90 degrees flexion to promote glute med strengthening  Yellow band 5x6  Towel padding for L lateral knee  Seated hip adduction ball and glute max squeeze again 10x5 seconds  Standing L hip abduction with B UE assist 5x. Difficult with L knee joint pain. Eases with rest  Standing B scapular retraction 5x5 seconds for 2 sets to promote thoracic extension and decrease low back extension pressure.    Standing glute max squeeze 5x5 seconds for 2 sets   Improved exercise technique, movement at target joints, use of target muscles after mod verbal, visual, tactile cues.   Response to treatment Pt tolerated exercises without aggravation of L lateral leg symptoms while performing them. Increased L lateral leg symptoms observed when pt is standing. Decreases with sitting observed.     Clinical impression Worked on decreasing lateral hamstrings, vastus lateralis and IT band muscle tension to help decrease tension to L tibia and promote better mechanics at the knee joint when walking. No L lateral leg tingling with walking about 20 ft with SPC afterwards. Worked on glute med, max strengthening to help promote femoral control and better mechanics at the knee joint during gait. Still demonstrates L LE symptoms with gait after session based on pt reports. Pt also demonstrates poor trunk, pelvic, and  femoral control when walking or performing standing tasks. Pt will benefit from continued skilled physical therapy services to decrease pain, improve strength, function, and ability to ambulate.        PT Short Term Goals - 04/30/18 1451      PT SHORT TERM GOAL #1   Title  Patient will be independent with her HEP to improve strength, decrease pain, improve function    Time  3    Period  Weeks    Status  New  Target Date  05/23/18        PT Long Term Goals - 04/30/18 1452      PT LONG TERM GOAL #1   Title  Patient will have a decrease in mid to low back pain to 5/10 or less at worst to promote ability to ambulate more comfortably and tolerate standing longer.     Baseline  8/10 back pain at worst for the past 2 months (possible greater than 8/10 at worst based on pt reports but pt seems to have difficulty with the pain scale; 04/30/2018)    Time  6    Period  Weeks    Status  New    Target Date  06/13/18      PT LONG TERM GOAL #2   Title  Pt will have a decrease in L lateral leg pain to 5/10 or less at worst  to promote ability to ambulate more comfortably and tolerate standing longer.     Baseline  10/10 at worst for the past 2 months (04/30/2018)    Time  6    Period  Weeks    Status  New    Target Date  06/13/18      PT LONG TERM GOAL #3   Title  Patient will have a decrease in L knee joint pain to 5/10 or less at worst  to promote ability to ambulate more comfortably and tolerate standing longer.     Baseline  10/10 L knee pain at worst for the past 2 months (04/30/2018)    Time  6    Period  Weeks    Status  New    Target Date  06/13/18      PT LONG TERM GOAL #4   Title  Pt will improve B LE strength by at least 1/2 MMT grade  to promote ability to ambulate more comfortably and tolerate standing longer.     Time  6    Period  Weeks    Status  New    Target Date  06/13/18      PT LONG TERM GOAL #5   Title  Pt will report being able to tolerate standing to 5  minutes or more to promote ability to stand at church as well as improve ability to perform chores.     Baseline  Pt able to tolerate standing 1 min per subjective reports. (04/30/2018)    Time  6    Period  Weeks    Status  New    Target Date  06/13/18            Plan - 05/02/18 1258    Clinical Impression Statement  Worked on decreasing lateral hamstrings, vastus lateralis and IT band muscle tension to help decrease tension to L tibia and promote better mechanics at the knee joint when walking. No L lateral leg tingling with walking about 20 ft with SPC afterwards. Worked on glute med, max strengthening to help promote femoral control and better mechanics at the knee joint during gait. Still demonstrates L LE symptoms with gait after session based on pt reports. Pt also demonstrates poor trunk, pelvic, and femoral control when walking or performing standing tasks. Pt will benefit from continued skilled physical therapy services to decrease pain, improve strength, function, and ability to ambulate.     Rehab Potential  Fair    Clinical Impairments Affecting Rehab Potential  Fall risk, age, co-morbidities, multiple areas of pain, LE weakness, decreased lumbar, pelvic, and  femoral control    PT Frequency  2x / week    PT Duration  6 weeks    PT Treatment/Interventions  Aquatic Therapy;Cryotherapy;Moist Heat;Gait training;Stair training;Functional mobility training;Therapeutic activities;Therapeutic exercise;Patient/family education;Neuromuscular re-education;Balance training;Manual techniques;Taping;Energy conservation;Passive range of motion;Electrical Stimulation;Iontophoresis 4mg /ml Dexamethasone;Dry needling    Consulted and Agree with Plan of Care  Patient;Family member/caregiver    Family Member Consulted  husband       Patient will benefit from skilled therapeutic intervention in order to improve the following deficits and impairments:  Abnormal gait, Pain, Postural dysfunction,  Decreased strength, Difficulty walking, Improper body mechanics, Decreased balance  Visit Diagnosis: Pain in thoracic spine  Chronic bilateral low back pain, unspecified whether sciatica present  Radiculopathy, lumbosacral region  Muscle weakness (generalized)  Difficulty in walking, not elsewhere classified  Unsteadiness on feet     Problem List Patient Active Problem List   Diagnosis Date Noted  . Protein-calorie malnutrition ([place]) 08/27/2017  . 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 Cubital canal compression syndrome, right 04/18/2017  . Primary osteoarthritis of knee 03/25/2017  . 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 Pure hypercholesterolemia 08/17/2016  . MDS (myelodysplastic syndrome) ([place]) 05/12/2016  . B12 deficiency 05/04/2016  . Essential hypertension 05/04/2016  . Mixed stress and urge urinary incontinence 05/04/2016  . Rheumatoid arthritis involving multiple sites with positive rheumatoid factor ([place]) 05/04/2016     Joneen Boers PT, DPT  05/02/2018, 3:10 PM  [facility] [address] [place], [address] Phone: [phone]   Fax:  [phone]  Name: Sylvia Miller MRN: [id number] Date of Birth: [birth year]

## 2018-05-06 ENCOUNTER — Ambulatory Visit: Payer: Medicare Other | Attending: Internal Medicine

## 2018-05-06 DIAGNOSIS — M545 Low back pain: Secondary | ICD-10-CM | POA: Insufficient documentation

## 2018-05-06 DIAGNOSIS — M5417 Radiculopathy, lumbosacral region: Secondary | ICD-10-CM

## 2018-05-06 DIAGNOSIS — G8929 Other chronic pain: Secondary | ICD-10-CM | POA: Diagnosis present

## 2018-05-06 DIAGNOSIS — M6281 Muscle weakness (generalized): Secondary | ICD-10-CM | POA: Diagnosis present

## 2018-05-06 DIAGNOSIS — R2681 Unsteadiness on feet: Secondary | ICD-10-CM

## 2018-05-06 DIAGNOSIS — R262 Difficulty in walking, not elsewhere classified: Secondary | ICD-10-CM | POA: Diagnosis present

## 2018-05-06 DIAGNOSIS — M546 Pain in thoracic spine: Secondary | ICD-10-CM | POA: Diagnosis not present

## 2018-05-06 NOTE — Therapy (Signed)
[facility] [address] [place], [address] Phone: [phone]   Fax:  [phone]  Physical Therapy Treatment  Patient Details  Name: Sylvia Miller MRN: [id number] Date of Birth: December 02, 1940 Referring Provider (PT): Marlowe Sax, MD   Encounter Date: 05/06/2018  PT End of Session - 05/06/18 0849    Visit Number  3    Number of Visits  13    Date for PT Re-Evaluation  06/13/18    Authorization Type  3    Authorization Time Period  of 10 progress report (from 04/30/2018)    PT Start Time  0849    PT Stop Time  0930    PT Time Calculation (min)  41 min    Activity Tolerance  Patient tolerated treatment well    Behavior During Therapy  [facility] for tasks assessed/performed       Past Medical History:  Diagnosis Date  . Allergy   . Anemia   . Arthritis    rheumatoid /knees/hands/shoulders (Right shoulder) infusion every 5 weeks  . Bladder incontinence   . Chicken pox   . Dyspnea    with exertion or far walking  . Gastric ulcer   . GERD (gastroesophageal reflux disease)   . Heart murmur   . Hypercholesteremia   . Hyperlipidemia, unspecified   . Hypertension    controlled with meds;   Marland Kitchen MDS (myelodysplastic syndrome) ([place]) 2000  . Memory deficit   . Muscular dystrophy ([place])    per patient, this is incorrect  . Myelodysplastic syndrome ([place])   . Myelodysplastic syndrome ([place])   . Rheumatoid arthritis ([place])   . Ulcer     Past Surgical History:  Procedure Laterality Date  . ABDOMINAL HYSTERECTOMY    . APPENDECTOMY     as a child  . CHOLECYSTECTOMY    . COLONOSCOPY    . COLONOSCOPY WITH PROPOFOL N/A 04/24/2017   Procedure: COLONOSCOPY WITH PROPOFOL;  Surgeon: Lollie Sails, MD;  Location: [facility] ENDOSCOPY;  Service: Endoscopy;  Laterality: N/A;  . ESOPHAGOGASTRODUODENOSCOPY (EGD) WITH PROPOFOL N/A 04/24/2017   Procedure: ESOPHAGOGASTRODUODENOSCOPY (EGD) WITH PROPOFOL;  Surgeon: Lollie Sails, MD;  Location: [facility] ENDOSCOPY;  Service: Endoscopy;  Laterality: N/A;  . JOINT REPLACEMENT    . KNEE ARTHROPLASTY Right 02/21/2017   Procedure: COMPUTER ASSISTED TOTAL KNEE ARTHROPLASTY;  Surgeon: Dereck Leep, MD;  Location: [facility] ORS;  Service: Orthopedics;  Laterality: Right;  . KNEE ARTHROPLASTY Left 08/06/2017   Procedure: COMPUTER ASSISTED TOTAL KNEE ARTHROPLASTY;  Surgeon: Dereck Leep, MD;  Location: [facility] ORS;  Service: Orthopedics;  Laterality: Left;  . NM INTRAVENOUS INJECTION ([facility] HX)     receives biologic infusions every 5 weeks for rheumatoid arthritis. followed by dr. Meda Coffee at [facility]  . TONSILLECTOMY    . UPPER GASTROINTESTINAL ENDOSCOPY    . WISDOM TOOTH EXTRACTION      There were no vitals filed for this visit.  Subjective Assessment - 05/06/18 0852    Subjective  Back seems to be ok. Has only been up for a little while. Has mostly been sitting. L leg tingles and aches currently.  Was ok after last session she thinks.     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 Pt had L TKA 08/05/2017. Last 3-4 months pt has been having pain L lateral leg. Pt also states feeling pain in her upper back  including her B shoulders. Mid back pain  travels down her spine to her low back and feel radiating pain down her L lateral leg. R LE seems fine.   Denies loss of bowel or bladder control, or saddle anesthesia.  No falls within the last 6 months.  Low back and L lateral leg pain seems to have increased since onset.    Difficulty with history. Husband assist   Patient Stated Goals  Walk more comfortably. Stand longer than a minute at church.    Currently in Pain?  Other (Comment)    Pain Score  --   no pain level provided.    Pain Onset  More than a month ago                               PT Education - 05/06/18 0926    Education provided  Yes    Education Details  ther-ex, HEP    Person(s) Educated  Patient     Methods  Explanation;Demonstration;Tactile cues;Verbal cues;Handout    Comprehension  Returned demonstration;Verbalized understanding        Objective    MedbridgeAccess Code:Access Code: [email or web address]    Does not think she has latex allergies   Manual therapy  Seated STM L lateral hamstrings, vastus lateralis, and IT band to help decrease tension to the L knee  Seated sustained medial pressure to L patella. Decreaed L lateral patellar pain    Therapeutic exercises   Seated L hip ER 10x2  Seated clamshells, hips less than 90 degrees flexion to promote glute med strengthening             Yellow band 10x2 (upgrade)             Towel padding for L lateral knee   Seated hip adduction ball and glute max squeeze 10x5 seconds for 2 sets  And 10x (upgrade)  Seated hip extension isometrics             L 10x5 seconds for  3 sets (upgrade)  Seated B scapular retraction   10x5 seconds for 3 sets (upgrade)   No L lateral leg tingling afterwards in sitting  Seated LAQ 2 lbs              L LE 10x3  Seated B shoulder extension isometrics, hands on thighs to promote trunk strengthening   10x5 seconds   Improved exercise technique, movement at target joints, use of target muscles after mod verbal, visual, tactile cues.  Response to treatment Decreased L patellar pain with gentle sustained medial pressure to L patella. Decreased L lateral leg pain after treatment to decrease pressure to low back.     Clinical impression Worked on decreasing L lateral thigh tension to L knee to help decrease leg and patellar symptoms. Also worked on promoting trunk, glute, and scapular strengthening to help decrease extension pressure to her low back. Decreased L patellar pain with gentle sustained medial pressure to L patella. Decreased L lateral leg pain after treatment to decrease pressure to low back. Pt tolerated session well without aggravation of symptoms. Pt will benefit from  continued skilled physical therapy services to decrease pain, improve strength and function.             PT Short Term Goals - 04/30/18 1451      PT SHORT TERM GOAL #1   Title  Patient will be independent with her HEP to improve strength, decrease pain, improve function  Time  3    Period  Weeks    Status  New    Target Date  05/23/18        PT Long Term Goals - 04/30/18 1452      PT LONG TERM GOAL #1   Title  Patient will have a decrease in mid to low back pain to 5/10 or less at worst to promote ability to ambulate more comfortably and tolerate standing longer.     Baseline  8/10 back pain at worst for the past 2 months (possible greater than 8/10 at worst based on pt reports but pt seems to have difficulty with the pain scale; 04/30/2018)    Time  6    Period  Weeks    Status  New    Target Date  06/13/18      PT LONG TERM GOAL #2   Title  Pt will have a decrease in L lateral leg pain to 5/10 or less at worst  to promote ability to ambulate more comfortably and tolerate standing longer.     Baseline  10/10 at worst for the past 2 months (04/30/2018)    Time  6    Period  Weeks    Status  New    Target Date  06/13/18      PT LONG TERM GOAL #3   Title  Patient will have a decrease in L knee joint pain to 5/10 or less at worst  to promote ability to ambulate more comfortably and tolerate standing longer.     Baseline  10/10 L knee pain at worst for the past 2 months (04/30/2018)    Time  6    Period  Weeks    Status  New    Target Date  06/13/18      PT LONG TERM GOAL #4   Title  Pt will improve B LE strength by at least 1/2 MMT grade  to promote ability to ambulate more comfortably and tolerate standing longer.     Time  6    Period  Weeks    Status  New    Target Date  06/13/18      PT LONG TERM GOAL #5   Title  Pt will report being able to tolerate standing to 5 minutes or more to promote ability to stand at church as well as improve ability to perform  chores.     Baseline  Pt able to tolerate standing 1 min per subjective reports. (04/30/2018)    Time  6    Period  Weeks    Status  New    Target Date  06/13/18            Plan - 05/06/18 0926    Clinical Impression Statement  Worked on decreasing L lateral thigh tension to L knee to help decrease leg and patellar symptoms. Also worked on promoting trunk, glute, and scapular strengthening to help decrease extension pressure to her low back. Decreased L patellar pain with gentle sustained medial pressure to L patella. Decreased L lateral leg pain after treatment to decrease pressure to low back. Pt tolerated session well without aggravation of symptoms. Pt will benefit from continued skilled physical therapy services to decrease pain, improve strength and function.     Rehab Potential  Fair    Clinical Impairments Affecting Rehab Potential  Fall risk, age, co-morbidities, multiple areas of pain, LE weakness, decreased lumbar, pelvic, and femoral control    PT Frequency  2x / week    PT Duration  6 weeks    PT Treatment/Interventions  Aquatic Therapy;Cryotherapy;Moist Heat;Gait training;Stair training;Functional mobility training;Therapeutic activities;Therapeutic exercise;Patient/family education;Neuromuscular re-education;Balance training;Manual techniques;Taping;Energy conservation;Passive range of motion;Electrical Stimulation;Iontophoresis 4mg /ml Dexamethasone;Dry needling    Consulted and Agree with Plan of Care  Patient;Family member/caregiver    Family Member Consulted  husband       Patient will benefit from skilled therapeutic intervention in order to improve the following deficits and impairments:  Abnormal gait, Pain, Postural dysfunction, Decreased strength, Difficulty walking, Improper body mechanics, Decreased balance  Visit Diagnosis: Pain in thoracic spine  Chronic bilateral low back pain, unspecified whether sciatica present  Radiculopathy, lumbosacral  region  Muscle weakness (generalized)  Difficulty in walking, not elsewhere classified  Unsteadiness on feet     Problem List Patient Active Problem List   Diagnosis Date Noted  . Protein-calorie malnutrition ([place]) 08/27/2017  . 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 Cubital canal compression syndrome, right 04/18/2017  . Primary osteoarthritis of knee 03/25/2017  . 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 Pure hypercholesterolemia 08/17/2016  . MDS (myelodysplastic syndrome) ([place]) 05/12/2016  . B12 deficiency 05/04/2016  . Essential hypertension 05/04/2016  . Mixed stress and urge urinary incontinence 05/04/2016  . Rheumatoid arthritis involving multiple sites with positive rheumatoid factor ([place]) 05/04/2016    Joneen Boers PT, DPT   05/06/2018, 1:00 PM  [facility] [address] [place], [address] Phone: [phone]   Fax:  [phone]  Name: Abbigael Detlefsen MRN: [id number] Date of Birth: October 26, 1940

## 2018-05-06 NOTE — Patient Instructions (Signed)
MedbridgeAccess Code:Access Code: [email or web address]  Seated Hip Adduction Squeeze with Ball   10x3 with 5 second holds   Seated Scapular Retraction  10x3 with 5 seconds

## 2018-05-08 ENCOUNTER — Ambulatory Visit: Payer: Medicare Other

## 2018-05-09 ENCOUNTER — Ambulatory Visit: Payer: Medicare Other

## 2018-05-09 DIAGNOSIS — M545 Low back pain: Secondary | ICD-10-CM

## 2018-05-09 DIAGNOSIS — M5417 Radiculopathy, lumbosacral region: Secondary | ICD-10-CM

## 2018-05-09 DIAGNOSIS — R2681 Unsteadiness on feet: Secondary | ICD-10-CM

## 2018-05-09 DIAGNOSIS — M546 Pain in thoracic spine: Secondary | ICD-10-CM

## 2018-05-09 DIAGNOSIS — G8929 Other chronic pain: Secondary | ICD-10-CM

## 2018-05-09 DIAGNOSIS — R262 Difficulty in walking, not elsewhere classified: Secondary | ICD-10-CM

## 2018-05-09 DIAGNOSIS — M6281 Muscle weakness (generalized): Secondary | ICD-10-CM

## 2018-05-09 NOTE — Therapy (Signed)
[facility] [address] [place], [address] Phone: [phone]   Fax:  [phone]  Physical Therapy Treatment  Patient Details  Name: Sylvia Miller MRN: [id number] Date of Birth: 1940-11-30 Referring Provider (PT): Marlowe Sax, MD   Encounter Date: 05/09/2018  PT End of Session - 05/09/18 1518    Visit Number  4    Number of Visits  13    Date for PT Re-Evaluation  06/13/18    Authorization Type  4    Authorization Time Period  of 10 progress report (from 04/30/2018)    PT Start Time  1518    PT Stop Time  1601    PT Time Calculation (min)  43 min    Activity Tolerance  Patient tolerated treatment well    Behavior During Therapy  [facility] for tasks assessed/performed       Past Medical History:  Diagnosis Date  . Allergy   . Anemia   . Arthritis    rheumatoid /knees/hands/shoulders (Right shoulder) infusion every 5 weeks  . Bladder incontinence   . Chicken pox   . Dyspnea    with exertion or far walking  . Gastric ulcer   . GERD (gastroesophageal reflux disease)   . Heart murmur   . Hypercholesteremia   . Hyperlipidemia, unspecified   . Hypertension    controlled with meds;   Marland Kitchen MDS (myelodysplastic syndrome) ([place]) 2000  . Memory deficit   . Muscular dystrophy ([place])    per patient, this is incorrect  . Myelodysplastic syndrome ([place])   . Myelodysplastic syndrome ([place])   . Rheumatoid arthritis ([place])   . Ulcer     Past Surgical History:  Procedure Laterality Date  . ABDOMINAL HYSTERECTOMY    . APPENDECTOMY     as a child  . CHOLECYSTECTOMY    . COLONOSCOPY    . COLONOSCOPY WITH PROPOFOL N/A 04/24/2017   Procedure: COLONOSCOPY WITH PROPOFOL;  Surgeon: Lollie Sails, MD;  Location: [facility] ENDOSCOPY;  Service: Endoscopy;  Laterality: N/A;  . ESOPHAGOGASTRODUODENOSCOPY (EGD) WITH PROPOFOL N/A 04/24/2017   Procedure: ESOPHAGOGASTRODUODENOSCOPY (EGD) WITH PROPOFOL;  Surgeon: Lollie Sails, MD;  Location: [facility] ENDOSCOPY;  Service: Endoscopy;  Laterality: N/A;  . JOINT REPLACEMENT    . KNEE ARTHROPLASTY Right 02/21/2017   Procedure: COMPUTER ASSISTED TOTAL KNEE ARTHROPLASTY;  Surgeon: Dereck Leep, MD;  Location: [facility] ORS;  Service: Orthopedics;  Laterality: Right;  . KNEE ARTHROPLASTY Left 08/06/2017   Procedure: COMPUTER ASSISTED TOTAL KNEE ARTHROPLASTY;  Surgeon: Dereck Leep, MD;  Location: [facility] ORS;  Service: Orthopedics;  Laterality: Left;  . NM INTRAVENOUS INJECTION ([facility] HX)     receives biologic infusions every 5 weeks for rheumatoid arthritis. followed by dr. Meda Coffee at [place] [facility]  . TONSILLECTOMY    . UPPER GASTROINTESTINAL ENDOSCOPY    . WISDOM TOOTH EXTRACTION      There were no vitals filed for this visit.  Subjective Assessment - 05/09/18 1520    Subjective  Pt states that she thinks her back is ok. Has been going from appointment to appointment. Feels sore L lateral leg and a little tingling. Does not think there is back pain currently.     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 Pt had L TKA 08/05/2017. Last 3-4 months pt has been having pain L lateral leg. Pt also states feeling pain in her upper back  including her B shoulders. Mid  back pain travels down her spine to her low back and feel radiating pain down her L lateral leg. R LE seems fine.   Denies loss of bowel or bladder control, or saddle anesthesia.  No falls within the last 6 months.  Low back and L lateral leg pain seems to have increased since onset.    Difficulty with history. Husband assist   Patient Stated Goals  Walk more comfortably. Stand longer than a minute at church.    Currently in Pain?  No/denies    Pain Score  --   no back pain but has L lateral leg tingling   Pain Onset  More than a month ago                               PT Education - 05/09/18 1540    Education provided  Yes    Education Details  ther-ex     [employer]) Educated  Patient    Methods  Explanation;Demonstration;Tactile cues;Verbal cues    Comprehension  Returned demonstration;Verbalized understanding        Objective    MedbridgeAccess Code:Access Code: [email or web address]    Does not think she has latex allergies   Manual therapy Seated STM L lateral hamstrings, vastus lateralis, and IT band to help decrease tension to the L knee  Seated sustained medial pressure to L patella to promote gentle movement and decrease stiffness  Seated STM L tibialis anterior. Decreased tingling L lateral leg in sitting    Therapeutic exercises  Seated L hip ER 10x2 with 5 second holds  Seated L hip extension isometrics 10x5 seconds for 3 sets  Seated clamshells, hips less than 90 degrees flexionto promote glute med strengthening Yellow band 10x2 (upgrade) Towel padding for L lateral knee  Seated hip adduction ball and glute max squeeze 10x5 seconds then 5x5 seconds. L ankle area discomfort which eases with rest.    Seated LAQ 2 lbs to promote quad strengthening L LE 10x3  Seated B shoulder extension isometrics, hands on thighs to promote trunk strengthening              10x5 seconds for 2 sets      Improved exercise technique, movement at target joints, use of target muscles after mod verbal, visual, tactile cues.  Response to treatment Decreased L lateral leg tingling and numbness in sitting after STM to decrease tension to L tibialis anterior. Slight difficulty walking on L knee however after exercises based on pt reports.    Continued working on improving L hip strength, decreasing tension to L vastus lateralis, lateral hamstrings, IT band to help decrease knee pain during gait as well as decreasing L tibialis muscle tension to decrease L lateral leg paresthesia. Slight increased L knee discomfort with walking however, reported by pt after exercises. Decreased L lateral leg  symptoms in sitting after STM to decrease tibialis anterior muscle tension. Unsure if there is a change in L lateral leg symptoms in walking afterwards. Pt will benefit from continued skilled physical therapy services to decrease pain, improve strength and function.         PT Short Term Goals - 04/30/18 1451      PT SHORT TERM GOAL #1   Title  Patient will be independent with her HEP to improve strength, decrease pain, improve function    Time  3    Period  Weeks    Status  New  Target Date  05/23/18        PT Long Term Goals - 04/30/18 1452      PT LONG TERM GOAL #1   Title  Patient will have a decrease in mid to low back pain to 5/10 or less at worst to promote ability to ambulate more comfortably and tolerate standing longer.     Baseline  8/10 back pain at worst for the past 2 months (possible greater than 8/10 at worst based on pt reports but pt seems to have difficulty with the pain scale; 04/30/2018)    Time  6    Period  Weeks    Status  New    Target Date  06/13/18      PT LONG TERM GOAL #2   Title  Pt will have a decrease in L lateral leg pain to 5/10 or less at worst  to promote ability to ambulate more comfortably and tolerate standing longer.     Baseline  10/10 at worst for the past 2 months (04/30/2018)    Time  6    Period  Weeks    Status  New    Target Date  06/13/18      PT LONG TERM GOAL #3   Title  Patient will have a decrease in L knee joint pain to 5/10 or less at worst  to promote ability to ambulate more comfortably and tolerate standing longer.     Baseline  10/10 L knee pain at worst for the past 2 months (04/30/2018)    Time  6    Period  Weeks    Status  New    Target Date  06/13/18      PT LONG TERM GOAL #4   Title  Pt will improve B LE strength by at least 1/2 MMT grade  to promote ability to ambulate more comfortably and tolerate standing longer.     Time  6    Period  Weeks    Status  New    Target Date  06/13/18      PT LONG TERM  GOAL #5   Title  Pt will report being able to tolerate standing to 5 minutes or more to promote ability to stand at church as well as improve ability to perform chores.     Baseline  Pt able to tolerate standing 1 min per subjective reports. (04/30/2018)    Time  6    Period  Weeks    Status  New    Target Date  06/13/18            Plan - 05/09/18 1541    Clinical Impression Statement  Continued working on improving L hip strength, decreasing tension to L vastus lateralis, lateral hamstrings, IT band to help decrease knee pain during gait as well as decreasing L tibialis muscle tension to decrease L lateral leg paresthesia. Slight increased L knee discomfort with walking however, reported by pt after exercises. Decreased L lateral leg symptoms in sitting after STM to decrease tibialis anterior muscle tension. Unsure if there is a change in L lateral leg symptoms in walking afterwards. Pt will benefit from continued skilled physical therapy services to decrease pain, improve strength and function.     Rehab Potential  Fair    Clinical Impairments Affecting Rehab Potential  Fall risk, age, co-morbidities, multiple areas of pain, LE weakness, decreased lumbar, pelvic, and femoral control    PT Frequency  2x / week  PT Duration  6 weeks    PT Treatment/Interventions  Aquatic Therapy;Cryotherapy;Moist Heat;Gait training;Stair training;Functional mobility training;Therapeutic activities;Therapeutic exercise;Patient/family education;Neuromuscular re-education;Balance training;Manual techniques;Taping;Energy conservation;Passive range of motion;Electrical Stimulation;Iontophoresis 4mg /ml Dexamethasone;Dry needling    Consulted and Agree with Plan of Care  Patient;Family member/caregiver    Family Member Consulted  husband       Patient will benefit from skilled therapeutic intervention in order to improve the following deficits and impairments:  Abnormal gait, Pain, Postural dysfunction,  Decreased strength, Difficulty walking, Improper body mechanics, Decreased balance  Visit Diagnosis: Pain in thoracic spine  Chronic bilateral low back pain, unspecified whether sciatica present  Radiculopathy, lumbosacral region  Muscle weakness (generalized)  Difficulty in walking, not elsewhere classified  Unsteadiness on feet     Problem List Patient Active Problem List   Diagnosis Date Noted  . Protein-calorie malnutrition ([place]) 08/27/2017  . 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 Cubital canal compression syndrome, right 04/18/2017  . Primary osteoarthritis of knee 03/25/2017  . 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 Pure hypercholesterolemia 08/17/2016  . MDS (myelodysplastic syndrome) ([place]) 05/12/2016  . B12 deficiency 05/04/2016  . Essential hypertension 05/04/2016  . Mixed stress and urge urinary incontinence 05/04/2016  . Rheumatoid arthritis involving multiple sites with positive rheumatoid factor ([place]) 05/04/2016    Joneen Boers PT, DPT   05/09/2018, 4:32 PM   [facility] [address] [place], [address] Phone: [phone]   Fax:  [phone]  Name: Sylvia Miller MRN: [id number] Date of Birth: [birth year]

## 2018-05-13 ENCOUNTER — Ambulatory Visit: Payer: Medicare Other

## 2018-05-13 DIAGNOSIS — M5417 Radiculopathy, lumbosacral region: Secondary | ICD-10-CM

## 2018-05-13 DIAGNOSIS — M6281 Muscle weakness (generalized): Secondary | ICD-10-CM

## 2018-05-13 DIAGNOSIS — R2681 Unsteadiness on feet: Secondary | ICD-10-CM

## 2018-05-13 DIAGNOSIS — G8929 Other chronic pain: Secondary | ICD-10-CM

## 2018-05-13 DIAGNOSIS — M545 Low back pain, unspecified: Secondary | ICD-10-CM

## 2018-05-13 DIAGNOSIS — R262 Difficulty in walking, not elsewhere classified: Secondary | ICD-10-CM

## 2018-05-13 DIAGNOSIS — M546 Pain in thoracic spine: Secondary | ICD-10-CM

## 2018-05-13 NOTE — Therapy (Signed)
[facility] [address] [place], [address] Phone: [phone]   Fax:  [phone]  Physical Therapy Treatment  Patient Details  Name: Sylvia Miller MRN: [id number] Date of Birth: 09-12-1940 Referring Provider (PT): Marlowe Sax, MD    Encounter Date: 05/13/2018  PT End of Session - 05/13/18 1516    Visit Number  5    Number of Visits  13    Date for PT Re-Evaluation  06/13/18    Authorization Type  5    Authorization Time Period  of 10 progress report (from 04/30/2018)    PT Start Time  1516    PT Stop Time  1557    PT Time Calculation (min)  41 min    Activity Tolerance  Patient tolerated treatment well    Behavior During Therapy  [facility] for tasks assessed/performed       Past Medical History:  Diagnosis Date  . Allergy   . Anemia   . Arthritis    rheumatoid /knees/hands/shoulders (Right shoulder) infusion every 5 weeks  . Bladder incontinence   . Chicken pox   . Dyspnea    with exertion or far walking  . Gastric ulcer   . GERD (gastroesophageal reflux disease)   . Heart murmur   . Hypercholesteremia   . Hyperlipidemia, unspecified   . Hypertension    controlled with meds;   Marland Kitchen MDS (myelodysplastic syndrome) ([place]) 2000  . Memory deficit   . Muscular dystrophy ([place])    per patient, this is incorrect  . Myelodysplastic syndrome ([place])   . Myelodysplastic syndrome ([place])   . Rheumatoid arthritis ([place])   . Ulcer     Past Surgical History:  Procedure Laterality Date  . ABDOMINAL HYSTERECTOMY    . APPENDECTOMY     as a child  . CHOLECYSTECTOMY    . COLONOSCOPY    . COLONOSCOPY WITH PROPOFOL N/A 04/24/2017   Procedure: COLONOSCOPY WITH PROPOFOL;  Surgeon: Lollie Sails, MD;  Location: [facility] ENDOSCOPY;  Service: Endoscopy;  Laterality: N/A;  . ESOPHAGOGASTRODUODENOSCOPY (EGD) WITH PROPOFOL N/A 04/24/2017   Procedure: ESOPHAGOGASTRODUODENOSCOPY (EGD) WITH PROPOFOL;  Surgeon:  Lollie Sails, MD;  Location: [facility] ENDOSCOPY;  Service: Endoscopy;  Laterality: N/A;  . JOINT REPLACEMENT    . KNEE ARTHROPLASTY Right 02/21/2017   Procedure: COMPUTER ASSISTED TOTAL KNEE ARTHROPLASTY;  Surgeon: Dereck Leep, MD;  Location: [facility] ORS;  Service: Orthopedics;  Laterality: Right;  . KNEE ARTHROPLASTY Left 08/06/2017   Procedure: COMPUTER ASSISTED TOTAL KNEE ARTHROPLASTY;  Surgeon: Dereck Leep, MD;  Location: [facility] ORS;  Service: Orthopedics;  Laterality: Left;  . NM INTRAVENOUS INJECTION ([facility] HX)     receives biologic infusions every 5 weeks for rheumatoid arthritis. followed by dr. Meda Coffee at [place] [facility]  . TONSILLECTOMY    . UPPER GASTROINTESTINAL ENDOSCOPY    . WISDOM TOOTH EXTRACTION      There were no vitals filed for this visit.  Subjective Assessment - 05/13/18 1518    Subjective  Back is not hurting too much. The L knee joint bothers (6-7/10) her (the cooler air does not help). L lateral leg feels tingly (7/10 when walking).     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 Pt had L TKA 08/05/2017. Last 3-4 months pt has been having pain L lateral leg. Pt also states feeling pain in her upper back  including her B shoulders. Mid back pain travels down her  spine to her low back and feel radiating pain down her L lateral leg. R LE seems fine.   Denies loss of bowel or bladder control, or saddle anesthesia.  No falls within the last 6 months.  Low back and L lateral leg pain seems to have increased since onset.    Difficulty with history. Husband assist   Patient Stated Goals  Walk more comfortably. Stand longer than a minute at church.    Currently in Pain?  Yes    Pain Score  7     Pain Onset  More than a month ago                               PT Education - 05/13/18 1530    Education provided  Yes    Education Details  ther-ex    [employer]) Educated  Patient    Methods  Explanation;Demonstration;Tactile  cues;Verbal cues    Comprehension  Returned demonstration;Verbalized understanding       Objective    MedbridgeAccess Code:Access Code: [email or web address]   Does not think she has latex allergies    Therapeutic exercises  Seated manually resisted trunk extension 10x5 second for 3 sets  Seated L foot ball rolls for L knee flexion and extension to decrease stiffness 10x3  Seated trunk flexion isometrics with PT resistance 10x5 seconds for 3 sets  Seated L hip flexion 10x3  Seated L knee extension 10x3  Seated ankle DF/PF 10x3  To promote L LE neural mobility to help decrease L lateral leg symptoms.    Decreased L lateral leg tingling after aforementioned exercses  Seated pallof press yellow band  R 10x5 seconds  L 5x5 seconds. Low back discomfort.   Seated R lateral shift correction   Decreased L lateral leg discomfort  10x5 seconds, then 5x5 seconds. Low back discomfort afterwards which eases with rest  Seated manually resisted L lateral shift to correct R lateral shift  10x5 seconds   Decreased L lateral leg tingling with walking after session   Improved exercise technique, movement at target joints, use of target muscles after mod verbal, visual, tactile cues.    Response to treatment Decreased L lateral leg tingling after session    Clinical impression Worked on decreasing R lateral shift, improving L LE neural mobility, and improving trunk strength. Decreased L lateral leg symptoms with sitting and with gait after session. Pt will benefit from continued skilled physical therapy services to decrease pain, improve strength and function.      PT Short Term Goals - 04/30/18 1451      PT SHORT TERM GOAL #1   Title  Patient will be independent with her HEP to improve strength, decrease pain, improve function    Time  3    Period  Weeks    Status  New    Target Date  05/23/18        PT Long Term Goals - 04/30/18 1452      PT LONG TERM GOAL #1    Title  Patient will have a decrease in mid to low back pain to 5/10 or less at worst to promote ability to ambulate more comfortably and tolerate standing longer.     Baseline  8/10 back pain at worst for the past 2 months (possible greater than 8/10 at worst based on pt reports but pt seems to have difficulty with the pain scale; 04/30/2018)    Time  6    Period  Weeks    Status  New    Target Date  06/13/18      PT LONG TERM GOAL #2   Title  Pt will have a decrease in L lateral leg pain to 5/10 or less at worst  to promote ability to ambulate more comfortably and tolerate standing longer.     Baseline  10/10 at worst for the past 2 months (04/30/2018)    Time  6    Period  Weeks    Status  New    Target Date  06/13/18      PT LONG TERM GOAL #3   Title  Patient will have a decrease in L knee joint pain to 5/10 or less at worst  to promote ability to ambulate more comfortably and tolerate standing longer.     Baseline  10/10 L knee pain at worst for the past 2 months (04/30/2018)    Time  6    Period  Weeks    Status  New    Target Date  06/13/18      PT LONG TERM GOAL #4   Title  Pt will improve B LE strength by at least 1/2 MMT grade  to promote ability to ambulate more comfortably and tolerate standing longer.     Time  6    Period  Weeks    Status  New    Target Date  06/13/18      PT LONG TERM GOAL #5   Title  Pt will report being able to tolerate standing to 5 minutes or more to promote ability to stand at church as well as improve ability to perform chores.     Baseline  Pt able to tolerate standing 1 min per subjective reports. (04/30/2018)    Time  6    Period  Weeks    Status  New    Target Date  06/13/18            Plan - 05/13/18 1538    Clinical Impression Statement  Worked on decreasing R lateral shift, improving L LE neural mobility, and improving trunk strength. Decreased L lateral leg symptoms with sitting and with gait after session. Pt will benefit from  continued skilled physical therapy services to decrease pain, improve strength and function.     Rehab Potential  Fair    Clinical Impairments Affecting Rehab Potential  Fall risk, age, co-morbidities, multiple areas of pain, LE weakness, decreased lumbar, pelvic, and femoral control    PT Frequency  2x / week    PT Duration  6 weeks    PT Treatment/Interventions  Aquatic Therapy;Cryotherapy;Moist Heat;Gait training;Stair training;Functional mobility training;Therapeutic activities;Therapeutic exercise;Patient/family education;Neuromuscular re-education;Balance training;Manual techniques;Taping;Energy conservation;Passive range of motion;Electrical Stimulation;Iontophoresis 4mg /ml Dexamethasone;Dry needling    Consulted and Agree with Plan of Care  Patient;Family member/caregiver    Family Member Consulted  husband       Patient will benefit from skilled therapeutic intervention in order to improve the following deficits and impairments:  Abnormal gait, Pain, Postural dysfunction, Decreased strength, Difficulty walking, Improper body mechanics, Decreased balance  Visit Diagnosis: Pain in thoracic spine  Chronic bilateral low back pain, unspecified whether sciatica present  Radiculopathy, lumbosacral region  Muscle weakness (generalized)  Difficulty in walking, not elsewhere classified  Unsteadiness on feet     Problem List Patient Active Problem List   Diagnosis Date Noted  . Protein-calorie malnutrition ([place]) 08/27/2017  . 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  Cubital canal compression syndrome, right 04/18/2017  . Primary osteoarthritis of knee 03/25/2017  . 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 Pure hypercholesterolemia 08/17/2016  . MDS (myelodysplastic syndrome) ([place]) 05/12/2016  . B12 deficiency 05/04/2016  . Essential hypertension 05/04/2016  . Mixed stress and urge urinary incontinence 05/04/2016  . Rheumatoid arthritis involving multiple sites  with positive rheumatoid factor ([place]) 05/04/2016     Joneen Boers PT, DPT  05/13/2018, 8:11 PM  [facility] [address] [place], [address] Phone: [phone]   Fax:  [phone]  Name: Sylvia Miller MRN: [id number] Date of Birth: 1940/12/23

## 2018-05-14 ENCOUNTER — Ambulatory Visit: Payer: Medicare Other | Admitting: Oncology

## 2018-05-15 ENCOUNTER — Ambulatory Visit: Payer: Medicare Other

## 2018-05-15 ENCOUNTER — Other Ambulatory Visit: Payer: Self-pay

## 2018-05-15 DIAGNOSIS — R262 Difficulty in walking, not elsewhere classified: Secondary | ICD-10-CM

## 2018-05-15 DIAGNOSIS — M5417 Radiculopathy, lumbosacral region: Secondary | ICD-10-CM

## 2018-05-15 DIAGNOSIS — G8929 Other chronic pain: Secondary | ICD-10-CM

## 2018-05-15 DIAGNOSIS — M546 Pain in thoracic spine: Secondary | ICD-10-CM

## 2018-05-15 DIAGNOSIS — M545 Low back pain: Secondary | ICD-10-CM

## 2018-05-15 DIAGNOSIS — R2681 Unsteadiness on feet: Secondary | ICD-10-CM

## 2018-05-15 DIAGNOSIS — M6281 Muscle weakness (generalized): Secondary | ICD-10-CM

## 2018-05-15 NOTE — Therapy (Signed)
[facility] [address], [place], [address] Phone: [phone]   Fax:  [phone]  Physical Therapy Treatment  Patient Details  Name: Sylvia Miller MRN: [id number] Date of Birth: 01-14-1941 Referring Provider (PT): Marlowe Sax, MD   Encounter Date: 05/15/2018  PT End of Session - 05/15/18 0902    Visit Number  6    Number of Visits  13    Date for PT Re-Evaluation  06/13/18    Authorization Type  6    Authorization Time Period  of 10 progress report (from 04/30/2018)    PT Start Time  0903   pt arrived late   PT Stop Time  0935    PT Time Calculation (min)  32 min    Activity Tolerance  Patient tolerated treatment well    Behavior During Therapy  [facility] for tasks assessed/performed       Past Medical History:  Diagnosis Date  . Allergy   . Anemia   . Arthritis    rheumatoid /knees/hands/shoulders (Right shoulder) infusion every 5 weeks  . Bladder incontinence   . Chicken pox   . Dyspnea    with exertion or far walking  . Gastric ulcer   . GERD (gastroesophageal reflux disease)   . Heart murmur   . Hypercholesteremia   . Hyperlipidemia, unspecified   . Hypertension    controlled with meds;   Marland Kitchen MDS (myelodysplastic syndrome) ([place]) 2000  . Memory deficit   . Muscular dystrophy ([place])    per patient, this is incorrect  . Myelodysplastic syndrome ([place])   . Myelodysplastic syndrome ([place])   . Rheumatoid arthritis ([place])   . Ulcer     Past Surgical History:  Procedure Laterality Date  . ABDOMINAL HYSTERECTOMY    . APPENDECTOMY     as a child  . CHOLECYSTECTOMY    . COLONOSCOPY    . COLONOSCOPY WITH PROPOFOL N/A 04/24/2017   Procedure: COLONOSCOPY WITH PROPOFOL;  Surgeon: Lollie Sails, MD;  Location: [facility] ENDOSCOPY;  Service: Endoscopy;  Laterality: N/A;  . ESOPHAGOGASTRODUODENOSCOPY (EGD) WITH PROPOFOL N/A 04/24/2017   Procedure: ESOPHAGOGASTRODUODENOSCOPY (EGD) WITH PROPOFOL;   Surgeon: Lollie Sails, MD;  Location: [facility] ENDOSCOPY;  Service: Endoscopy;  Laterality: N/A;  . JOINT REPLACEMENT    . KNEE ARTHROPLASTY Right 02/21/2017   Procedure: COMPUTER ASSISTED TOTAL KNEE ARTHROPLASTY;  Surgeon: Dereck Leep, MD;  Location: [facility] ORS;  Service: Orthopedics;  Laterality: Right;  . KNEE ARTHROPLASTY Left 08/06/2017   Procedure: COMPUTER ASSISTED TOTAL KNEE ARTHROPLASTY;  Surgeon: Dereck Leep, MD;  Location: [facility] ORS;  Service: Orthopedics;  Laterality: Left;  . NM INTRAVENOUS INJECTION ([facility] HX)     receives biologic infusions every 5 weeks for rheumatoid arthritis. followed by dr. Meda Coffee at [place] [facility]  . TONSILLECTOMY    . UPPER GASTROINTESTINAL ENDOSCOPY    . WISDOM TOOTH EXTRACTION      There were no vitals filed for this visit.  Subjective Assessment - 05/15/18 0905    Subjective  L knee is a little upset because of the cold weather. Back seems to be ok. A little bit of pain on the L side (L posterior hip, posterior lateral thigh around the L5/S1 dermatome). L knee joint bothers her. L lateral leg feels tingling but not numb.  Did ok after last session.     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 Pt had L TKA 08/05/2017. Last  3-4 months pt has been having pain L lateral leg. Pt also states feeling pain in her upper back  including her B shoulders. Mid back pain travels down her spine to her low back and feel radiating pain down her L lateral leg. R LE seems fine.   Denies loss of bowel or bladder control, or saddle anesthesia.  No falls within the last 6 months.  Low back and L lateral leg pain seems to have increased since onset.    Difficulty with history. Husband assist   Patient Stated Goals  Walk more comfortably. Stand longer than a minute at church.    Currently in Pain?  Yes    Pain Score  --   no pain level provided.    Pain Onset  More than a month ago                               PT  Education - 05/15/18 0915    Education provided  Yes    Education Details  ther-ex    [employer]) Educated  Patient    Methods  Explanation;Demonstration;Tactile cues;Verbal cues    Comprehension  Returned demonstration;Verbalized understanding       Objective    MedbridgeAccess Code:Access Code: [email or web address]   Does not think she has latex allergies    Therapeutic exercises  Seated manually resisted trunk extension 10x5 second for 3 sets  Increased L lateral thigh pain   Seated trunk flexion isometrics with PT resistance 10x5 seconds for 3 sets  Decreased L lateral thigh pain   Seated manually resisted L lateral shift to correct R lateral shift             10x5 seconds for 3 sets   Decreased L lateral leg tingling in sitting after aforementioned exercises  Seated L foot ball rolls for L knee flexion and extension to decrease stiffness 10x3  Seated L hip flexion 10x3             Seated L knee extension 10x3             Seated ankle DF/PF 10x3             To promote L LE neural mobility to help decrease L lateral leg symptoms.    Improved exercise technique, movement at target joints, use of target muscles after mod verbal, visual, tactile cues.    Manual therapy   Seated STM L lateral hamstrings Stopped early secondary to time constraints    Response to treatment Decreased L lateral leg tingling after session in sitting.    Clinical impression Continued working on improving trunk posture and decreasing lateral shift to help decrease L lateral leg symptoms. Pt arrived to clinic late so session was adjusted accordingly. Pt tolerated session well without aggravation of symptoms. Pt will benefit from continued skilled physical therapy services to improve strength, function, and decrease pain.      PT Short Term Goals - 04/30/18 1451      PT SHORT TERM GOAL #1   Title  Patient will be independent with her HEP to improve strength, decrease pain,  improve function    Time  3    Period  Weeks    Status  New    Target Date  05/23/18        PT Long Term Goals - 04/30/18 1452      PT LONG TERM GOAL #1   Title  Patient will have a decrease in mid to low back pain to 5/10 or less at worst to promote ability to ambulate more comfortably and tolerate standing longer.     Baseline  8/10 back pain at worst for the past 2 months (possible greater than 8/10 at worst based on pt reports but pt seems to have difficulty with the pain scale; 04/30/2018)    Time  6    Period  Weeks    Status  New    Target Date  06/13/18      PT LONG TERM GOAL #2   Title  Pt will have a decrease in L lateral leg pain to 5/10 or less at worst  to promote ability to ambulate more comfortably and tolerate standing longer.     Baseline  10/10 at worst for the past 2 months (04/30/2018)    Time  6    Period  Weeks    Status  New    Target Date  06/13/18      PT LONG TERM GOAL #3   Title  Patient will have a decrease in L knee joint pain to 5/10 or less at worst  to promote ability to ambulate more comfortably and tolerate standing longer.     Baseline  10/10 L knee pain at worst for the past 2 months (04/30/2018)    Time  6    Period  Weeks    Status  New    Target Date  06/13/18      PT LONG TERM GOAL #4   Title  Pt will improve B LE strength by at least 1/2 MMT grade  to promote ability to ambulate more comfortably and tolerate standing longer.     Time  6    Period  Weeks    Status  New    Target Date  06/13/18      PT LONG TERM GOAL #5   Title  Pt will report being able to tolerate standing to 5 minutes or more to promote ability to stand at church as well as improve ability to perform chores.     Baseline  Pt able to tolerate standing 1 min per subjective reports. (04/30/2018)    Time  6    Period  Weeks    Status  New    Target Date  06/13/18            Plan - 05/15/18 0902    Clinical Impression Statement  Continued working on improving  trunk posture and decreasing lateral shift to help decrease L lateral leg symptoms. Pt arrived to clinic late so session was adjusted accordingly. Pt tolerated session well without aggravation of symptoms. Pt will benefit from continued skilled physical therapy services to improve strength, function, and decrease pain.     Rehab Potential  Fair    Clinical Impairments Affecting Rehab Potential  Fall risk, age, co-morbidities, multiple areas of pain, LE weakness, decreased lumbar, pelvic, and femoral control    PT Frequency  2x / week    PT Duration  6 weeks    PT Treatment/Interventions  Aquatic Therapy;Cryotherapy;Moist Heat;Gait training;Stair training;Functional mobility training;Therapeutic activities;Therapeutic exercise;Patient/family education;Neuromuscular re-education;Balance training;Manual techniques;Taping;Energy conservation;Passive range of motion;Electrical Stimulation;Iontophoresis 4mg /ml Dexamethasone;Dry needling    Consulted and Agree with Plan of Care  Patient;Family member/caregiver    Family Member Consulted  husband       Patient will benefit from skilled therapeutic intervention in order to improve the following deficits and impairments:  Abnormal gait, Pain, Postural dysfunction, Decreased strength, Difficulty walking, Improper body mechanics, Decreased balance  Visit Diagnosis: Pain in thoracic spine  Chronic bilateral low back pain, unspecified whether sciatica present  Radiculopathy, lumbosacral region  Muscle weakness (generalized)  Difficulty in walking, not elsewhere classified  Unsteadiness on feet     Problem List Patient Active Problem List   Diagnosis Date Noted  . Protein-calorie malnutrition ([place]) 08/27/2017  . 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 Cubital canal compression syndrome, right 04/18/2017  . Primary osteoarthritis of knee 03/25/2017  . 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 Pure hypercholesterolemia 08/17/2016   . MDS (myelodysplastic syndrome) ([place]) 05/12/2016  . B12 deficiency 05/04/2016  . Essential hypertension 05/04/2016  . Mixed stress and urge urinary incontinence 05/04/2016  . Rheumatoid arthritis involving multiple sites with positive rheumatoid factor ([place]) 05/04/2016    Joneen Boers PT, DPT   05/15/2018, 6:41 PM  [facility] [address], [place], [address] Phone: [phone]   Fax:  [phone]  Name: Emillia Weatherly MRN: [id number] Date of Birth: 1940/11/23

## 2018-05-16 ENCOUNTER — Other Ambulatory Visit: Payer: Self-pay

## 2018-05-22 ENCOUNTER — Ambulatory Visit: Payer: Medicare Other

## 2018-05-22 ENCOUNTER — Other Ambulatory Visit: Payer: Self-pay

## 2018-05-22 DIAGNOSIS — M6281 Muscle weakness (generalized): Secondary | ICD-10-CM

## 2018-05-22 DIAGNOSIS — M545 Low back pain: Secondary | ICD-10-CM

## 2018-05-22 DIAGNOSIS — M5417 Radiculopathy, lumbosacral region: Secondary | ICD-10-CM

## 2018-05-22 DIAGNOSIS — R262 Difficulty in walking, not elsewhere classified: Secondary | ICD-10-CM

## 2018-05-22 DIAGNOSIS — G8929 Other chronic pain: Secondary | ICD-10-CM

## 2018-05-22 DIAGNOSIS — M546 Pain in thoracic spine: Secondary | ICD-10-CM

## 2018-05-22 DIAGNOSIS — R2681 Unsteadiness on feet: Secondary | ICD-10-CM

## 2018-05-22 NOTE — Patient Instructions (Signed)
  Sitting on a chair    Press your hands on your thighs to feel your abdominal muscles contract.   Hold for 5 seconds comfortably.   Repeat 10 times.   Perform 3 sets daily.   

## 2018-05-22 NOTE — Therapy (Signed)
[facility] [address] [place], [address] Phone: [phone]   Fax:  [phone]  Physical Therapy Treatment  Patient Details  Name: Sylvia Miller MRN: [id number] Date of Birth: Apr 22, 1940 Referring Provider (PT): Marlowe Sax, MD   Encounter Date: 05/22/2018  PT End of Session - 05/22/18 1001    Visit Number  7    Number of Visits  13    Date for PT Re-Evaluation  06/13/18    Authorization Type  7    Authorization Time Period  of 10 progress report (from 04/30/2018)    PT Start Time  1001    PT Stop Time  1058    PT Time Calculation (min)  57 min    Activity Tolerance  Patient tolerated treatment well    Behavior During Therapy  [facility] for tasks assessed/performed       Past Medical History:  Diagnosis Date  . Allergy   . Anemia   . Arthritis    rheumatoid /knees/hands/shoulders (Right shoulder) infusion every 5 weeks  . Bladder incontinence   . Chicken pox   . Dyspnea    with exertion or far walking  . Gastric ulcer   . GERD (gastroesophageal reflux disease)   . Heart murmur   . Hypercholesteremia   . Hyperlipidemia, unspecified   . Hypertension    controlled with meds;   Marland Kitchen MDS (myelodysplastic syndrome) ([place]) 2000  . Memory deficit   . Muscular dystrophy ([place])    per patient, this is incorrect  . Myelodysplastic syndrome ([place])   . Myelodysplastic syndrome ([place])   . Rheumatoid arthritis ([place])   . Ulcer     Past Surgical History:  Procedure Laterality Date  . ABDOMINAL HYSTERECTOMY    . APPENDECTOMY     as a child  . CHOLECYSTECTOMY    . COLONOSCOPY    . COLONOSCOPY WITH PROPOFOL N/A 04/24/2017   Procedure: COLONOSCOPY WITH PROPOFOL;  Surgeon: Lollie Sails, MD;  Location: [facility] ENDOSCOPY;  Service: Endoscopy;  Laterality: N/A;  . ESOPHAGOGASTRODUODENOSCOPY (EGD) WITH PROPOFOL N/A 04/24/2017   Procedure: ESOPHAGOGASTRODUODENOSCOPY (EGD) WITH PROPOFOL;  Surgeon:  Lollie Sails, MD;  Location: [facility] ENDOSCOPY;  Service: Endoscopy;  Laterality: N/A;  . JOINT REPLACEMENT    . KNEE ARTHROPLASTY Right 02/21/2017   Procedure: COMPUTER ASSISTED TOTAL KNEE ARTHROPLASTY;  Surgeon: Dereck Leep, MD;  Location: [facility] ORS;  Service: Orthopedics;  Laterality: Right;  . KNEE ARTHROPLASTY Left 08/06/2017   Procedure: COMPUTER ASSISTED TOTAL KNEE ARTHROPLASTY;  Surgeon: Dereck Leep, MD;  Location: [facility] ORS;  Service: Orthopedics;  Laterality: Left;  . NM INTRAVENOUS INJECTION ([facility] HX)     receives biologic infusions every 5 weeks for rheumatoid arthritis. followed by dr. Meda Coffee at [place] [facility]  . TONSILLECTOMY    . UPPER GASTROINTESTINAL ENDOSCOPY    . WISDOM TOOTH EXTRACTION      There were no vitals filed for this visit.  Subjective Assessment - 05/22/18 1004    Subjective  L knee bothers her. L leg is sort of a little numb.  Uses ice every night. Back seems to be ok. 5/10 L knee pain when walking. L knee pain is towards the middle.  Pt and husband says that L knee and leg is worse since her knee surgery.     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 Pt had L TKA 08/05/2017. Last 3-4 months pt has been having pain  L lateral leg. Pt also states feeling pain in her upper back  including her B shoulders. Mid back pain travels down her spine to her low back and feel radiating pain down her L lateral leg. R LE seems fine.   Denies loss of bowel or bladder control, or saddle anesthesia.  No falls within the last 6 months.  Low back and L lateral leg pain seems to have increased since onset.    Difficulty with history. Husband assist   Patient Stated Goals  Walk more comfortably. Stand longer than a minute at church.    Currently in Pain?  Yes    Pain Score  5     Pain Onset  More than a month ago                               PT Education - 05/22/18 1043    Education provided  Yes    Education Details   ther-ex,, HEP    Person(s) Educated  Patient;Spouse    Methods  Explanation;Demonstration;Tactile cues;Verbal cues;Handout    Comprehension  Verbalized understanding;Returned demonstration       Objective    MedbridgeAccess Code:Access Code: [email or web address]   Does not think she has latex allergies   Manual therapy  Seated STM L lateral hamstrings Seated STM L vastus lateralis and L IT band Seated gentle sustained medial glide L patella  Seated STM to L tibialis anterior. No L lateral leg tingling afterwards.       Therapeutic exercises  Seated B shoulder extension, hands on thighs  10x5 seconds for 3 sets  Increased tingling, no more numbness in sitting  Reviewed and given as part of her HEP. Pt and husband (caregiver) demonstrated and verbalized understanding. Handout provided.   Seated manually resisted L lateral shift to correct R lateral shift posture 10x5 seconds for 3 sets   Seated trunk flexion isometrics with PT resistance 10x5 seconds for 3 sets  Reclined clamshell yellow band 1x. L hamstring cramp. Eases with seated rest  Seated hip adduction pillow and glute max squeeze 10x5 seconds.   Fatigue afterwards   Improved exercise technique, movement at target joints, use of target muscles after min to mod verbal, visual, tactile cues.      Response to treatment Decreased L lateral leg symptoms overall in sitting after seated exercises.    Clinical impression Decreased L lateral thigh symptoms with treatment to help decrease low back extension pressure. Pt seems to be improving overall with L knee and low back pain based on subjective reports of back being ok and 5/10 L knee pain when walking. Pt started off with 8/10 back and 9/10 L knee current pain at evaluation day. Pt will benefit from continued skilled physical therapy services to decrease pain, improve strength and function.     PT Short Term Goals - 04/30/18 1451       PT SHORT TERM GOAL #1   Title  Patient will be independent with her HEP to improve strength, decrease pain, improve function    Time  3    Period  Weeks    Status  New    Target Date  05/23/18        PT Long Term Goals - 04/30/18 1452      PT LONG TERM GOAL #1   Title  Patient will have a decrease in mid to low back pain to 5/10 or less at worst  to promote ability to ambulate more comfortably and tolerate standing longer.     Baseline  8/10 back pain at worst for the past 2 months (possible greater than 8/10 at worst based on pt reports but pt seems to have difficulty with the pain scale; 04/30/2018)    Time  6    Period  Weeks    Status  New    Target Date  06/13/18      PT LONG TERM GOAL #2   Title  Pt will have a decrease in L lateral leg pain to 5/10 or less at worst  to promote ability to ambulate more comfortably and tolerate standing longer.     Baseline  10/10 at worst for the past 2 months (04/30/2018)    Time  6    Period  Weeks    Status  New    Target Date  06/13/18      PT LONG TERM GOAL #3   Title  Patient will have a decrease in L knee joint pain to 5/10 or less at worst  to promote ability to ambulate more comfortably and tolerate standing longer.     Baseline  10/10 L knee pain at worst for the past 2 months (04/30/2018)    Time  6    Period  Weeks    Status  New    Target Date  06/13/18      PT LONG TERM GOAL #4   Title  Pt will improve B LE strength by at least 1/2 MMT grade  to promote ability to ambulate more comfortably and tolerate standing longer.     Time  6    Period  Weeks    Status  New    Target Date  06/13/18      PT LONG TERM GOAL #5   Title  Pt will report being able to tolerate standing to 5 minutes or more to promote ability to stand at church as well as improve ability to perform chores.     Baseline  Pt able to tolerate standing 1 min per subjective reports. (04/30/2018)    Time  6    Period  Weeks    Status  New    Target Date   06/13/18            Plan - 05/22/18 1001    Clinical Impression Statement  Decreased L lateral thigh symptoms with treatment to help decrease low back extension pressure. Pt seems to be improving overall with L knee and low back pain based on subjective reports of back being ok and 5/10 L knee pain when walking. Pt started off with 8/10 back and 9/10 L knee current pain at evaluation day. Pt will benefit from continued skilled physical therapy services to decrease pain, improve strength and function.    Rehab Potential  Fair    Clinical Impairments Affecting Rehab Potential  Fall risk, age, co-morbidities, multiple areas of pain, LE weakness, decreased lumbar, pelvic, and femoral control    PT Frequency  2x / week    PT Duration  6 weeks    PT Treatment/Interventions  Aquatic Therapy;Cryotherapy;Moist Heat;Gait training;Stair training;Functional mobility training;Therapeutic activities;Therapeutic exercise;Patient/family education;Neuromuscular re-education;Balance training;Manual techniques;Taping;Energy conservation;Passive range of motion;Electrical Stimulation;Iontophoresis 4mg /ml Dexamethasone;Dry needling    Consulted and Agree with Plan of Care  Patient;Family member/caregiver    Family Member Consulted  husband       Patient will benefit from skilled therapeutic intervention in order to improve the following  deficits and impairments:  Abnormal gait, Pain, Postural dysfunction, Decreased strength, Difficulty walking, Improper body mechanics, Decreased balance  Visit Diagnosis: Pain in thoracic spine  Chronic bilateral low back pain, unspecified whether sciatica present  Radiculopathy, lumbosacral region  Muscle weakness (generalized)  Difficulty in walking, not elsewhere classified  Unsteadiness on feet     Problem List Patient Active Problem List   Diagnosis Date Noted  . Protein-calorie malnutrition ([place]) 08/27/2017  . 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  Cubital canal compression syndrome, right 04/18/2017  . Primary osteoarthritis of knee 03/25/2017  . 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 Pure hypercholesterolemia 08/17/2016  . MDS (myelodysplastic syndrome) ([place]) 05/12/2016  . B12 deficiency 05/04/2016  . Essential hypertension 05/04/2016  . Mixed stress and urge urinary incontinence 05/04/2016  . Rheumatoid arthritis involving multiple sites with positive rheumatoid factor ([place]) 05/04/2016    Joneen Boers PT, DPT   05/22/2018, 3:37 PM  [facility] [address], [place], [address] Phone: [phone]   Fax:  [phone]  Name: Sylvia Miller MRN: [id number] Date of Birth: October 20, 1940

## 2018-05-23 ENCOUNTER — Inpatient Hospital Stay: Payer: Medicare Other | Attending: Oncology | Admitting: Oncology

## 2018-05-23 ENCOUNTER — Inpatient Hospital Stay: Payer: Medicare Other | Admitting: Oncology

## 2018-05-23 ENCOUNTER — Encounter: Payer: Self-pay | Admitting: Oncology

## 2018-05-23 ENCOUNTER — Inpatient Hospital Stay: Payer: Medicare Other

## 2018-05-23 ENCOUNTER — Other Ambulatory Visit: Payer: Self-pay

## 2018-05-23 VITALS — BP 147/72 | HR 90 | Temp 97.9°F | Resp 18 | Wt 145.9 lb

## 2018-05-23 DIAGNOSIS — D469 Myelodysplastic syndrome, unspecified: Secondary | ICD-10-CM | POA: Diagnosis present

## 2018-05-23 DIAGNOSIS — M069 Rheumatoid arthritis, unspecified: Secondary | ICD-10-CM | POA: Insufficient documentation

## 2018-05-23 DIAGNOSIS — Z79899 Other long term (current) drug therapy: Secondary | ICD-10-CM | POA: Diagnosis not present

## 2018-05-23 DIAGNOSIS — D539 Nutritional anemia, unspecified: Secondary | ICD-10-CM | POA: Diagnosis not present

## 2018-05-23 LAB — CBC WITH DIFFERENTIAL/PLATELET
Abs Immature Granulocytes: 0.01 10*3/uL (ref 0.00–0.07)
Basophils Absolute: 0 10*3/uL (ref 0.0–0.1)
Basophils Relative: 0 %
Eosinophils Absolute: 0.1 10*3/uL (ref 0.0–0.5)
Eosinophils Relative: 3 %
HCT: 34 % — ABNORMAL LOW (ref 36.0–46.0)
Hemoglobin: 11.3 g/dL — ABNORMAL LOW (ref 12.0–15.0)
IMMATURE GRANULOCYTES: 0 %
Lymphocytes Relative: 37 %
Lymphs Abs: 1.6 10*3/uL (ref 0.7–4.0)
MCH: 32.1 pg (ref 26.0–34.0)
MCHC: 33.2 g/dL (ref 30.0–36.0)
MCV: 96.6 fL (ref 80.0–100.0)
Monocytes Absolute: 0.5 10*3/uL (ref 0.1–1.0)
Monocytes Relative: 12 %
Neutro Abs: 2.2 10*3/uL (ref 1.7–7.7)
Neutrophils Relative %: 48 %
Platelets: 186 10*3/uL (ref 150–400)
RBC: 3.52 MIL/uL — ABNORMAL LOW (ref 3.87–5.11)
RDW: 13 % (ref 11.5–15.5)
WBC: 4.4 10*3/uL (ref 4.0–10.5)
nRBC: 0 % (ref 0.0–0.2)

## 2018-05-23 LAB — IRON AND TIBC
Iron: 45 ug/dL (ref 28–170)
Saturation Ratios: 10 % — ABNORMAL LOW (ref 10.4–31.8)
TIBC: 472 ug/dL — ABNORMAL HIGH (ref 250–450)
UIBC: 427 ug/dL

## 2018-05-23 LAB — FOLATE: Folate: >100 ng/mL (ref >5.9)

## 2018-05-23 LAB — RETICULOCYTES
IMMATURE RETIC FRACT: 12.3 % (ref 2.3–15.9)
RBC.: 3.52 MIL/uL — ABNORMAL LOW (ref 3.87–5.11)
RETIC CT PCT: 2.1 % (ref 0.4–3.1)
Retic Count, Absolute: 72.5 10*3/uL (ref 19.0–186.0)

## 2018-05-23 LAB — VITAMIN B12: Vitamin B-12: 1410 pg/mL — ABNORMAL HIGH (ref 180–914)

## 2018-05-23 LAB — LACTATE DEHYDROGENASE: LDH: 178 U/L (ref 98–192)

## 2018-05-23 LAB — TECHNOLOGIST SMEAR REVIEW: Plt Morphology: ADEQUATE

## 2018-05-23 LAB — FERRITIN: FERRITIN: 15 ng/mL (ref 11–307)

## 2018-05-23 LAB — TSH: TSH: 0.986 u[IU]/mL (ref 0.350–4.500)

## 2018-05-24 ENCOUNTER — Telehealth: Payer: Self-pay | Admitting: *Deleted

## 2018-05-24 LAB — HAPTOGLOBIN: Haptoglobin: 181 mg/dL (ref 42–346)

## 2018-05-24 NOTE — Telephone Encounter (Signed)
-----   Message from Sindy Guadeloupe, MD sent at 05/24/2018  8:20 AM EDT ----- She has mild iron deficiency. Please ask her to take oral iron 325 mg once or twice a day as tolerated. Thanks, Astrid Divine

## 2018-05-24 NOTE — Telephone Encounter (Signed)
Called patient cell phone number and patient talk to me and I went over the iron pill and taking it once a a day for a week and then increase it to 1 tablet twice a day and making sure she is ate food before she takes it.  Told there that it could cause some constipation and stomach aches but when she takes it with food it should make it better.  Also that that it can turn the stools dark tarry looking.  The patient gave the phone to her husband and I went over all this with her husband and they are outside in the parking lot of [facility] and they will go back in and try to find iron pills and start them immediately

## 2018-05-26 NOTE — Progress Notes (Signed)
Hematology/Oncology Consult note [facility]  [phone] [phone]  Patient Care Team: Dion Body, MD as PCP - General (Family Medicine)   Name of the patient: Sylvia Miller  [id number]  [birth year]   Date of visit: 05/26/18  Diagnosis- h/o MDS  Chief complaint/ Reason for visit- routine f/u of MDS lost to follow up  Heme/Onc history:  patient is a 78 year old female who initially had all her care at [place]. Past medical history is significant for B12 and deficiency for which she receives B12 injections monthly. She also has hypertension hyperlipidemia and rheumatoid arthritis along with other medical problems. She has a history of MDS and had a bone marrow biopsy in 2000 and she was being treated with Aranesp in the past with good response. Looks like her last dose of Aranesp was sometime in 2013 and since then her hemoglobin has remained stable around 12. In October 2016 CBC showed white count of 5.6, H&H of 11.8/36 and a platelet count of 202. MCV was 101. Her labs on 05/05/2016 were as follows: CBC showed white count of 3.4, H&H of 12.5/37 with an MCV of 102.5 and a platelet count of 172. B12 was normal at 642. CMP was within normal limits.  Patient has been rereferred by Dr. Netty Starring for routine follow-up of MDS.  Most recent CBC from 05/14/2018 showed white count of 5.7, H&H of 10.8/33.6 with an MCV of 100.6 and a platelet count of 184.  Differential was essentially normal.   Interval history-patient has issues with chronic joint pain due to her rheumatoid arthritis but otherwise feels well and denies any acute issues at this time.  Denies any recurrent infections.  She has been getting Remicade through Dr. Jenny Reichmann  ECOG PS- 1 Pain scale- 4   Review of systems- Review of Systems  Constitutional: Positive for malaise/fatigue. Negative for chills, fever and weight loss.  HENT: Negative for congestion, ear discharge  and nosebleeds.   Eyes: Negative for blurred vision.  Respiratory: Negative for cough, hemoptysis, sputum production, shortness of breath and wheezing.   Cardiovascular: Negative for chest pain, palpitations, orthopnea and claudication.  Gastrointestinal: Negative for abdominal pain, blood in stool, constipation, diarrhea, heartburn, melena, nausea and vomiting.  Genitourinary: Negative for dysuria, flank pain, frequency, hematuria and urgency.  Musculoskeletal: Positive for joint pain. Negative for back pain and myalgias.  Skin: Negative for rash.  Neurological: Negative for dizziness, tingling, focal weakness, seizures, weakness and headaches.  Endo/Heme/Allergies: Does not bruise/bleed easily.  Psychiatric/Behavioral: Negative for depression and suicidal ideas. The patient does not have insomnia.       Allergies  Allergen Reactions  . Pollen Extract Other (See Comments)    Nose runs, eyes itch, etc     Past Medical History:  Diagnosis Date  . Allergy   . Anemia   . Arthritis    rheumatoid /knees/hands/shoulders (Right shoulder) infusion every 5 weeks  . Bladder incontinence   . Chicken pox   . Dyspnea    with exertion or far walking  . Gastric ulcer   . GERD (gastroesophageal reflux disease)   . Heart murmur   . Hypercholesteremia   . Hyperlipidemia, unspecified   . Hypertension    controlled with meds;   Marland Kitchen MDS (myelodysplastic syndrome) ([place]) 2000  . Memory deficit   . Muscular dystrophy ([place])    per patient, this is incorrect  . Myelodysplastic syndrome ([place])   . Myelodysplastic syndrome ([place])   . Rheumatoid arthritis (  [place])   . Ulcer      Past Surgical History:  Procedure Laterality Date  . ABDOMINAL HYSTERECTOMY    . APPENDECTOMY     as a child  . CHOLECYSTECTOMY    . COLONOSCOPY    . COLONOSCOPY WITH PROPOFOL N/A 04/24/2017   Procedure: COLONOSCOPY WITH PROPOFOL;  Surgeon: Lollie Sails, MD;  Location[facility] ENDOSCOPY;  Service: Endoscopy;   Laterality: N/A;  . ESOPHAGOGASTRODUODENOSCOPY (EGD) WITH PROPOFOL N/A 04/24/2017   Procedure: ESOPHAGOGASTRODUODENOSCOPY (EGD) WITH PROPOFOL;  Surgeon: Lollie Sails, MD;  Location: [facility] ENDOSCOPY;  Service: Endoscopy;  Laterality: N/A;  . JOINT REPLACEMENT    . KNEE ARTHROPLASTY Right 02/21/2017   Procedure: COMPUTER ASSISTED TOTAL KNEE ARTHROPLASTY;  Surgeon: Dereck Leep, MD;  Location: [facility] ORS;  Service: Orthopedics;  Laterality: Right;  . KNEE ARTHROPLASTY Left 08/06/2017   Procedure: COMPUTER ASSISTED TOTAL KNEE ARTHROPLASTY;  Surgeon: Dereck Leep, MD;  Location: [facility] ORS;  Service: Orthopedics;  Laterality: Left;  . NM INTRAVENOUS INJECTION ([facility] HX)     receives biologic infusions every 5 weeks for rheumatoid arthritis. followed by dr. Meda Coffee at [facility]  . TONSILLECTOMY    . UPPER GASTROINTESTINAL ENDOSCOPY    . WISDOM TOOTH EXTRACTION      Social History   Socioeconomic History  . Marital status: Married    Spouse name: Not on file  . Number of children: 2  . Years of education: 31  . Highest education level: Not on file  Occupational History  . Not on file  Social Needs  . Financial resource strain: Not on file  . Food insecurity:    Worry: Not on file    Inability: Not on file  . Transportation needs:    Medical: Not on file    Non-medical: Not on file  Tobacco Use  . Smoking status: Never Smoker  . Smokeless tobacco: Never Used  Substance and Sexual Activity  . Alcohol use: Yes    Comment: very occasional  . Drug use: Never  . Sexual activity: Yes  Lifestyle  . Physical activity:    Days per week: Not on file    Minutes per session: Not on file  . Stress: Not on file  Relationships  . Social connections:    Talks on phone: Not on file    Gets together: Not on file    Attends religious service: Not on file    Active member of club or organization: Not on file    Attends meetings of clubs or organizations: Not on file    Relationship  status: Not on file  . Intimate partner violence:    Fear of current or ex partner: Not on file    Emotionally abused: Not on file    Physically abused: Not on file    Forced sexual activity: Not on file  Other Topics Concern  . Not on file  Social History Narrative  . Not on file    Family History  Problem Relation Age of Onset  . Cancer Mother   . Lung cancer Mother   . Lung cancer Father   . Colon cancer Neg Hx   . Colon polyps Neg Hx   . Rectal cancer Neg Hx      Current Outpatient Medications:  .  acetaminophen (TYLENOL) 325 MG tablet, Take 650 mg by mouth every 6 (six) hours as needed for moderate pain or headache. , Disp: , Rfl:  .  Amino Acids-Protein Hydrolys (FEEDING  SUPPLEMENT, PRO-STAT SUGAR FREE 64,) LIQD, Take 30 mLs by mouth 2 (two) times daily., Disp: , Rfl:  .  Artificial Tear Solution (GENTEAL TEARS) 0.1-0.2-0.3 % SOLN, Place 1 drop into both eyes daily as needed (dry eye). , Disp: , Rfl:  .  aspirin EC 81 MG tablet, Take 81 mg by mouth daily., Disp: , Rfl:  .  atorvastatin (LIPITOR) 10 MG tablet, Take 5 mg by mouth daily. In the morning, Disp: , Rfl:  .  B Complex-C (SUPER B COMPLEX PO), Take 1 tablet by mouth daily., Disp: , Rfl:  .  calcium carbonate (OS-CAL) 1250 (500 Ca) MG chewable tablet, Chew 1 tablet by mouth daily. As needed for heartburn, Disp: , Rfl:  .  Cholecalciferol (VITAMIN D3) 5000 units CAPS, Take 5,000 Units by mouth daily., Disp: , Rfl:  .  Cyanocobalamin (VITAMIN B-12) 5000 MCG TBDP, Take 5,000 mcg by mouth daily. , Disp: , Rfl:  .  diclofenac sodium (VOLTAREN) 1 % GEL, Apply 2 g topically 4 (four) times daily as needed (pain). Apply to painful joint, Disp: , Rfl:  .  donepezil (ARICEPT) 10 MG tablet, Take by mouth., Disp: , Rfl:  .  folic acid (FOLVITE) 347 MCG tablet, Take 400 mcg by mouth daily., Disp: , Rfl:  .  gabapentin (NEURONTIN) 300 MG capsule, Take 1 capsule (300 mg total) by mouth at bedtime., Disp: 30 capsule, Rfl: 0 .   lisinopril (PRINIVIL,ZESTRIL) 5 MG tablet, Take 5 mg by mouth daily. In the morning, Disp: , Rfl:  .  meloxicam (MOBIC) 7.5 MG tablet, , Disp: , Rfl:  .  Menthol, Topical Analgesic, (BIOFREEZE EX), Apply 1 application topically as needed (for knee/leg pain)., Disp: , Rfl:  .  Misc Natural Products (GLUCOSAMINE CHOND COMPLEX/MSM PO), Take 1 tablet by mouth daily. , Disp: , Rfl:  .  Misc Natural Products (OSTEO BI-FLEX TRIPLE STRENGTH) TABS, Take 1 tablet by mouth daily., Disp: , Rfl:  .  Multiple Vitamins-Minerals (OCUVITE PO), Take 1 tablet by mouth daily., Disp: , Rfl:  .  Nutritional Supplements (ENSURE ENLIVE PO), Take 1 Bottle by mouth 2 (two) times daily., Disp: , Rfl:  .  oxybutynin (DITROPAN-XL) 10 MG 24 hr tablet, Take 10 mg by mouth daily., Disp: , Rfl: 0 .  pantoprazole (PROTONIX) 40 MG tablet, Take 40 mg by mouth daily. , Disp: , Rfl:  .  sulfaSALAzine (AZULFIDINE) 500 MG tablet, Take 1,000 mg by mouth 2 (two) times daily. , Disp: , Rfl:  .  guaiFENesin (MUCINEX) 600 MG 12 hr tablet, Take 600 mg by mouth as needed for cough. , Disp: , Rfl:   Physical exam:  Vitals:   05/23/18 1137  BP: (!) 147/72  Pulse: 90  Resp: 18  Temp: 97.9 F (36.6 C)  TempSrc: Tympanic  Weight: 145 lb 14.4 oz (66.2 kg)   Physical Exam Constitutional:      General: She is not in acute distress. HENT:     Head: Normocephalic and atraumatic.  Eyes:     Pupils: Pupils are equal, round, and reactive to light.  Neck:     Musculoskeletal: Normal range of motion.  Cardiovascular:     Rate and Rhythm: Normal rate and regular rhythm.     Heart sounds: Normal heart sounds.  Pulmonary:     Effort: Pulmonary effort is normal.     Breath sounds: Normal breath sounds.  Abdominal:     General: Bowel sounds are normal.     Palpations:  Abdomen is soft.  Skin:    General: Skin is warm and dry.  Neurological:     Mental Status: She is alert and oriented to person, place, and time.      CMP Latest Ref Rng  & Units 08/14/2017  Glucose 65 - 99 mg/dL 80  BUN 6 - 20 mg/dL 18  Creatinine 0.44 - 1.00 mg/dL 0.77  Sodium 135 - 145 mmol/L 137  Potassium 3.5 - 5.1 mmol/L 4.5  Chloride 101 - 111 mmol/L 106  CO2 22 - 32 mmol/L 24  Calcium 8.9 - 10.3 mg/dL 8.4(L)  Total Protein 6.5 - 8.1 g/dL 5.8(L)  Total Bilirubin 0.3 - 1.2 mg/dL 0.4  Alkaline Phos 38 - 126 U/L 92  AST 15 - 41 U/L 20  ALT 14 - 54 U/L 11(L)   CBC Latest Ref Rng & Units 05/23/2018  WBC 4.0 - 10.5 K/uL 4.4  Hemoglobin 12.0 - 15.0 g/dL 11.3(L)  Hematocrit 36.0 - 46.0 % 34.0(L)  Platelets 150 - 400 K/uL 186      Assessment and plan- Patient is a 78 y.o. female referred for MDS  On looking back at her recent CBC over the last 1 year patient's hemoglobin has remained stable between 10.5-11.5.  Platelet counts have remained normal.  Her white cell count fluctuates between 3.5 to normal.  She has not had any recurrent infections.  I will proceed with an anemia work-up today including a CBC with differential, ferritin and iron studies, B12 and folate, reticulocyte count haptoglobin and TSH.  I will also get a smear review.  We will call the patient with the results of the blood work.  Given the stability of her counts and ongoing COVID pandemic, I will see her back in 4 months   Visit Diagnosis 1. Macrocytic anemia   2. MDS (myelodysplastic syndrome) ([place])      Dr. Randa Evens, MD, MPH [facility] [phone] 05/26/2018 4:21 PM

## 2018-05-27 NOTE — Therapy (Signed)
[place] [facility] [address], [place], [address] Phone: [phone]   Fax:  [phone]  Patient Details  Name: Sylvia Miller MRN: [id number] Date of Birth: 09-10-1940 Referring Provider:  No ref. provider found  Encounter Date: 05/27/2018    Patient husband/pt caregiver entered clinic to let us know that he does not think his wife/patient should be going out to physical therapy due to the corona virus. He forgot the clinic phone number. Business card with clinic info provided. Husband/pt caregiver was also informed that the clinic is closed for at least 2 weeks or more due to the pandemic. If pt has any questions, she can give clinic a call and can leave a message if no one is available to answer the phone. Added glute max squeezes 10x3 with 5 second holds to pt HEP. Husband/pt caregiver expressed interest in telehealth or online PT if available. Pt husband/pt caregiver was informed that when clinic opens back up again, someone should be available to call patient to schedule more appointments if needed.       Joneen Boers PT, DPT  05/27/2018, 2:47 PM  [place] [facility] [address], [place], [address] Phone: [phone]   Fax:  [phone]

## 2018-05-29 ENCOUNTER — Ambulatory Visit: Payer: Medicare Other

## 2018-06-25 NOTE — Therapy (Signed)
[place] [facility] [address] [place], [address] Phone: [phone]   Fax:  [phone]  Patient Details  Name: Sylvia Miller MRN: [id number] Date of Birth: [birth year] Referring Provider:  No ref. provider found  Encounter Date: 06/25/2018   The patient has been contacted today in regards to telehealth services. The patient and husband/caregiver expressed an interest in participating in telehealth visits. Patient has been informed that an [facility] support representative will be reaching out to them to verify their insurance benefits and for scheduling.      Joneen Boers PT, DPT   06/25/2018, 4:28 PM  [place] [address] [place], [address] Phone: [phone]   Fax:  [phone]

## 2018-08-08 ENCOUNTER — Ambulatory Visit: Payer: Medicare Other | Attending: Internal Medicine

## 2018-08-08 ENCOUNTER — Other Ambulatory Visit: Payer: Self-pay

## 2018-08-08 DIAGNOSIS — R2681 Unsteadiness on feet: Secondary | ICD-10-CM | POA: Diagnosis present

## 2018-08-08 DIAGNOSIS — M5417 Radiculopathy, lumbosacral region: Secondary | ICD-10-CM

## 2018-08-08 DIAGNOSIS — M545 Low back pain, unspecified: Secondary | ICD-10-CM

## 2018-08-08 DIAGNOSIS — G8929 Other chronic pain: Secondary | ICD-10-CM | POA: Diagnosis present

## 2018-08-08 DIAGNOSIS — M25562 Pain in left knee: Secondary | ICD-10-CM | POA: Diagnosis present

## 2018-08-08 DIAGNOSIS — M6281 Muscle weakness (generalized): Secondary | ICD-10-CM

## 2018-08-08 DIAGNOSIS — M546 Pain in thoracic spine: Secondary | ICD-10-CM

## 2018-08-08 DIAGNOSIS — R262 Difficulty in walking, not elsewhere classified: Secondary | ICD-10-CM

## 2018-08-08 NOTE — Therapy (Signed)
[facility] [address] [place], [address] Phone: [phone]   Fax:  [phone]  Physical Therapy Treatment And Progress Report  Patient Details  Name: Sylvia Miller MRN: [id number] Date of Birth: 04/20/1940 Referring Provider (PT): Marlowe Sax, MD   Encounter Date: 08/08/2018  PT End of Session - 08/08/18 1459    Visit Number  8    Number of Visits  25    Date for PT Re-Evaluation  09/19/18    Authorization Type  8    Authorization Time Period  of 10 progress report (from 04/30/2018)    PT Start Time  1501    PT Stop Time  1545    PT Time Calculation (min)  44 min    Activity Tolerance  Patient tolerated treatment well    Behavior During Therapy  [facility] for tasks assessed/performed       Past Medical History:  Diagnosis Date  . Allergy   . Anemia   . Arthritis    rheumatoid /knees/hands/shoulders (Right shoulder) infusion every 5 weeks  . Bladder incontinence   . Chicken pox   . Dyspnea    with exertion or far walking  . Gastric ulcer   . GERD (gastroesophageal reflux disease)   . Heart murmur   . Hypercholesteremia   . Hyperlipidemia, unspecified   . Hypertension    controlled with meds;   Marland Kitchen MDS (myelodysplastic syndrome) ([place]) 2000  . Memory deficit   . Muscular dystrophy ([place])    per patient, this is incorrect  . Myelodysplastic syndrome ([place])   . Myelodysplastic syndrome ([place])   . Rheumatoid arthritis ([place])   . Ulcer     Past Surgical History:  Procedure Laterality Date  . ABDOMINAL HYSTERECTOMY    . APPENDECTOMY     as a child  . CHOLECYSTECTOMY    . COLONOSCOPY    . COLONOSCOPY WITH PROPOFOL N/A 04/24/2017   Procedure: COLONOSCOPY WITH PROPOFOL;  Surgeon: Lollie Sails, MD;  Location: [facility] ENDOSCOPY;  Service: Endoscopy;  Laterality: N/A;  . ESOPHAGOGASTRODUODENOSCOPY (EGD) WITH PROPOFOL N/A 04/24/2017   Procedure: ESOPHAGOGASTRODUODENOSCOPY (EGD) WITH  PROPOFOL;  Surgeon: Lollie Sails, MD;  Location: [facility] ENDOSCOPY;  Service: Endoscopy;  Laterality: N/A;  . JOINT REPLACEMENT    . KNEE ARTHROPLASTY Right 02/21/2017   Procedure: COMPUTER ASSISTED TOTAL KNEE ARTHROPLASTY;  Surgeon: Dereck Leep, MD;  Location: [facility] ORS;  Service: Orthopedics;  Laterality: Right;  . KNEE ARTHROPLASTY Left 08/06/2017   Procedure: COMPUTER ASSISTED TOTAL KNEE ARTHROPLASTY;  Surgeon: Dereck Leep, MD;  Location: [facility] ORS;  Service: Orthopedics;  Laterality: Left;  . NM INTRAVENOUS INJECTION ([facility] HX)     receives biologic infusions every 5 weeks for rheumatoid arthritis. followed by dr. Meda Coffee at [facility]  . TONSILLECTOMY    . UPPER GASTROINTESTINAL ENDOSCOPY    . WISDOM TOOTH EXTRACTION      There were no vitals filed for this visit.  Subjective Assessment - 08/08/18 1503    Subjective  L knee joint hurts 8/10 I guess. Every now and again, her L knee is going to buckle on her.  Not really much pain in her back.     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 Pt had L TKA 08/05/2017. Last 3-4 months pt has been having pain L lateral leg. Pt also states feeling pain in her upper back  including her B shoulders. Mid back pain  travels down her spine to her low back and feel radiating pain down her L lateral leg. R LE seems fine.   Denies loss of bowel or bladder control, or saddle anesthesia.  No falls within the last 6 months.  Low back and L lateral leg pain seems to have increased since onset.    Difficulty with history. Husband assist   Patient Stated Goals  Walk more comfortably. Stand longer than a minute at church.    Currently in Pain?  Yes    Pain Score  8     Pain Onset  More than a month ago         [facility] PT Assessment - 08/08/18 1456      Strength   Right Hip Flexion  4/5    Right Hip Extension  4/5   seated manually resisted   Right Hip ABduction  4+/5   Seated clamshell   Left Hip Flexion  4/5    Left  Hip Extension  4-/5   seated manually resisted   Left Hip ABduction  4+/5   Seated clamshell   Right Knee Flexion  4+/5    Right Knee Extension  5/5    Left Knee Flexion  4/5   no increase in L knee pain   Left Knee Extension  5/5                           PT Education - 08/08/18 1525    Education provided  Yes    Education Details  ther-ex, progress/current status with PT towards goals.     Person(s) Educated  Patient    Methods  Explanation;Demonstration;Tactile cues;Verbal cues    Comprehension  Returned demonstration;Verbalized understanding      Objective    MedbridgeAccess Code:Access Code: [email or web address]   Does not think she has latex allergies  Pt states that she and her husband have been walking a lot since the PT hiatus due to [place]. Also walked with her grandson    Therapeutic exercises  Seated manually resisted hip flexion, hip extension, hip abduction, knee flexion, knee extension 1-2x each way for each LE  Reviewed progress/current status with PT towards goals.   Seated clamshell isometrics 10x5 seconds for 3 sets  Seated L hip extension with PT manual resistance 10x, then 8x  Seated manually resisted knee flexion targeting the medial hamstrings 10x3   Seated B scapular retraction 10x5 seconds for 2 sets to promote thoracic extension and decrease low back extension pressure  Seated manually resisted trunk flexion isometrics to promote trunk strength and to help decrease low back extension pressure 10x5 seconds for 2 sets   Improved exercise technique, movement at target joints, use of target muscles after mod verbal, visual, tactile cues.     Response to treatment Good muscle use felt with exercises. Pt tolerated session well without aggravation of symptoms.    Clinical impression Pt demonstrates slight improvement in back, L knee and L lateral leg pain, as well as overall improvement in bilateral hip and knee  strength since last measured. Continued working on improving thoracic extension and trunk strength to help decrease low back extension pressure in standing, as well as continued working on improving glute med, max and medial hamstrings strength to promote better mechanics at her L knee joint. Pt tolerated session well without aggravation of symptoms. Good muscle use felt with exercises. Pt will benefit from continued skilled physical therapy services to decrease pain,  improve strength, function, lumbopelvic and femoral control, and decrease difficulty with ambulation.         PT Short Term Goals - 08/08/18 1826      PT SHORT TERM GOAL #1   Title  Patient will be independent with her HEP to improve strength, decrease pain, improve function    Time  3    Period  Weeks    Status  On-going    Target Date  05/23/18        PT Long Term Goals - 08/08/18 1506      PT LONG TERM GOAL #1   Title  Patient will have a decrease in mid to low back pain to 5/10 or less at worst to promote ability to ambulate more comfortably and tolerate standing longer.     Baseline  8/10 back pain at worst for the past 2 months (possible greater than 8/10 at worst based on pt reports but pt seems to have difficulty with the pain scale; 04/30/2018); 5-6/10 mid to low back pain at most for the past month (08/08/2018)     Time  6    Period  Weeks    Status  On-going    Target Date  09/19/18      PT LONG TERM GOAL #2   Title  Pt will have a decrease in L lateral leg pain to 5/10 or less at worst  to promote ability to ambulate more comfortably and tolerate standing longer.     Baseline  10/10 at worst for the past 2 months (04/30/2018); 7-8/10 L lateral leg pain/tingling at worst for the past month (08/08/2018).     Time  6    Period  Weeks    Status  On-going    Target Date  09/19/18      PT LONG TERM GOAL #3   Title  Patient will have a decrease in L knee joint pain to 5/10 or less at worst  to promote ability to  ambulate more comfortably and tolerate standing longer.     Baseline  10/10 L knee pain at worst for the past 2 months (04/30/2018); 8/10 at worst for the past month (08/08/2018)    Time  6    Period  Weeks    Status  On-going    Target Date  09/19/18      PT LONG TERM GOAL #4   Title  Pt will improve B LE strength by at least 1/2 MMT grade  to promote ability to ambulate more comfortably and tolerate standing longer.     Time  6    Period  Weeks    Status  Partially Met    Target Date  09/19/18      PT LONG TERM GOAL #5   Title  Pt will report being able to tolerate standing to 5 minutes or more to promote ability to stand at church as well as improve ability to perform chores.     Baseline  Pt able to tolerate standing 1 min per subjective reports. (04/30/2018); half a minute per pt (08/08/2018)    Time  6    Period  Weeks    Status  On-going    Target Date  09/19/18            Plan - 08/08/18 1456    Clinical Impression Statement  Pt demonstrates slight improvement in back, L knee and L lateral leg pain, as well as overall improvement in bilateral hip and  knee strength since last measured. Continued working on improving thoracic extension and trunk strength to help decrease low back extension pressure in standing, as well as continued working on improving glute med, max and medial hamstrings strength to promote better mechanics at her L knee joint. Pt tolerated session well without aggravation of symptoms. Good muscle use felt with exercises. Pt will benefit from continued skilled physical therapy services to decrease pain, improve strength, function, lumbopelvic and femoral control, and decrease difficulty with ambulation.     Personal Factors and Comorbidities  Age;Comorbidity 3+    Comorbidities  Memory deficit, rheumatoid arthritis, dyspnea, arthritis, B TKA, hx of hysterectomy    Examination-Activity Limitations  Squat;Stairs;Stand;Caring for Others;Carry;Transfers     Stability/Clinical Decision Making  Stable/Uncomplicated    Clinical Decision Making  Low    Clinical Presentation due to:  slight decrease in pain, improved LE strength    Rehab Potential  Fair    Clinical Impairments Affecting Rehab Potential  Fall risk, age, co-morbidities, multiple areas of pain, LE weakness, decreased lumbar, pelvic, and femoral control    PT Frequency  2x / week    PT Duration  6 weeks    PT Treatment/Interventions  Aquatic Therapy;Cryotherapy;Moist Heat;Gait training;Stair training;Functional mobility training;Therapeutic activities;Therapeutic exercise;Patient/family education;Neuromuscular re-education;Balance training;Manual techniques;Taping;Energy conservation;Passive range of motion;Electrical Stimulation;Iontophoresis 65m/ml Dexamethasone;Dry needling    Consulted and Agree with Plan of Care  Patient;Family member/caregiver    Family Member Consulted  husband       Patient will benefit from skilled therapeutic intervention in order to improve the following deficits and impairments:  Abnormal gait, Pain, Postural dysfunction, Decreased strength, Difficulty walking, Improper body mechanics, Decreased balance  Visit Diagnosis: Chronic bilateral low back pain, unspecified whether sciatica present - Plan: PT plan of care cert/re-cert  Pain in thoracic spine - Plan: PT plan of care cert/re-cert  Radiculopathy, lumbosacral region - Plan: PT plan of care cert/re-cert  Muscle weakness (generalized) - Plan: PT plan of care cert/re-cert  Difficulty in walking, not elsewhere classified - Plan: PT plan of care cert/re-cert  Unsteadiness on feet - Plan: PT plan of care cert/re-cert  Chronic pain of left knee - Plan: PT plan of care cert/re-cert     Problem List Patient Active Problem List   Diagnosis Date Noted  . Protein-calorie malnutrition ([place] 08/27/2017  . 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 Cubital canal compression syndrome, right 04/18/2017  .  Primary osteoarthritis of knee 03/25/2017  . 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 Pure hypercholesterolemia 08/17/2016  . MDS (myelodysplastic syndrome) ([place] 05/12/2016  . B12 deficiency 05/04/2016  . Essential hypertension 05/04/2016  . Mixed stress and urge urinary incontinence 05/04/2016  . Rheumatoid arthritis involving multiple sites with positive rheumatoid factor ([place] 05/04/2016     Thank you for your referral.    MJoneen BoersPT, DPT   08/08/2018, 6:43 PM  [facility] [address] [place] [address]Phone: [phone]  Fax:  [phone] Name: JTyniesha HowaldMRN: [id number] of Birth: 3Jul 26, 1942

## 2018-08-12 ENCOUNTER — Other Ambulatory Visit: Payer: Self-pay

## 2018-08-12 ENCOUNTER — Ambulatory Visit: Payer: Medicare Other

## 2018-08-12 DIAGNOSIS — R262 Difficulty in walking, not elsewhere classified: Secondary | ICD-10-CM

## 2018-08-12 DIAGNOSIS — M5417 Radiculopathy, lumbosacral region: Secondary | ICD-10-CM

## 2018-08-12 DIAGNOSIS — M545 Low back pain, unspecified: Secondary | ICD-10-CM

## 2018-08-12 DIAGNOSIS — M25562 Pain in left knee: Secondary | ICD-10-CM

## 2018-08-12 DIAGNOSIS — R2681 Unsteadiness on feet: Secondary | ICD-10-CM

## 2018-08-12 DIAGNOSIS — M546 Pain in thoracic spine: Secondary | ICD-10-CM

## 2018-08-12 DIAGNOSIS — G8929 Other chronic pain: Secondary | ICD-10-CM

## 2018-08-12 DIAGNOSIS — M6281 Muscle weakness (generalized): Secondary | ICD-10-CM

## 2018-08-12 NOTE — Therapy (Signed)
[facility] [address] [place], [address] Phone: [phone]   Fax:  [phone]  Physical Therapy Treatment  Patient Details  Name: Sylvia Miller MRN: [id number] Date of Birth: 08/05/1940 Referring Provider (PT): Marlowe Sax, MD   Encounter Date: 08/12/2018  PT End of Session - 08/12/18 1301    Visit Number  9    Number of Visits  25    Date for PT Re-Evaluation  09/19/18    Authorization Type  9    Authorization Time Period  of 10 progress report (from 04/30/2018)    PT Start Time  1301    PT Stop Time  1342    PT Time Calculation (min)  41 min    Activity Tolerance  Patient tolerated treatment well    Behavior During Therapy  [facility] for tasks assessed/performed       Past Medical History:  Diagnosis Date  . Allergy   . Anemia   . Arthritis    rheumatoid /knees/hands/shoulders (Right shoulder) infusion every 5 weeks  . Bladder incontinence   . Chicken pox   . Dyspnea    with exertion or far walking  . Gastric ulcer   . GERD (gastroesophageal reflux disease)   . Heart murmur   . Hypercholesteremia   . Hyperlipidemia, unspecified   . Hypertension    controlled with meds;   Marland Kitchen MDS (myelodysplastic syndrome) ([place]) 2000  . Memory deficit   . Muscular dystrophy ([place])    per patient, this is incorrect  . Myelodysplastic syndrome ([place])   . Myelodysplastic syndrome ([place])   . Rheumatoid arthritis ([place])   . Ulcer     Past Surgical History:  Procedure Laterality Date  . ABDOMINAL HYSTERECTOMY    . APPENDECTOMY     as a child  . CHOLECYSTECTOMY    . COLONOSCOPY    . COLONOSCOPY WITH PROPOFOL N/A 04/24/2017   Procedure: COLONOSCOPY WITH PROPOFOL;  Surgeon: Lollie Sails, MD;  Location[facility]COPY;  Service: Endoscopy;  Laterality: N/A;  . ESOPHAGOGASTRODUODENOSCOPY (EGD) WITH PROPOFOL N/A 04/24/2017   Procedure: ESOPHAGOGASTRODUODENOSCOPY (EGD) WITH PROPOFOL;  Surgeon: Lollie Sails, MD;  Location: [facility] ENDOSCOPY;  Service: Endoscopy;  Laterality: N/A;  . JOINT REPLACEMENT    . KNEE ARTHROPLASTY Right 02/21/2017   Procedure: COMPUTER ASSISTED TOTAL KNEE ARTHROPLASTY;  Surgeon: Dereck Leep, MD;  Location: [facility] ORS;  Service: Orthopedics;  Laterality: Right;  . KNEE ARTHROPLASTY Left 08/06/2017   Procedure: COMPUTER ASSISTED TOTAL KNEE ARTHROPLASTY;  Surgeon: Dereck Leep, MD;  Location: [facility] ORS;  Service: Orthopedics;  Laterality: Left;  . NM INTRAVENOUS INJECTION ([facility] HX)     receives biologic infusions every 5 weeks for rheumatoid arthritis. followed by dr. Meda Coffee at [facility]  . TONSILLECTOMY    . UPPER GASTROINTESTINAL ENDOSCOPY    . WISDOM TOOTH EXTRACTION      There were no vitals filed for this visit.  Subjective Assessment - 08/12/18 1303    Subjective  Pt states that she does not walk well with her SPC. L knee still hurts. 8/10 L knee pain when walking. Back seems to be ok. L shoulder is a little sore.   The L leg does not bother her but the L knee does.  Pt falls at times and thinks that her L knee buckles on her to cause it. Her husband or grandson usually catch her when she walks with them.     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 Pt had L TKA 08/05/2017. Last 3-4 months pt has been having pain L lateral leg. Pt also states feeling pain in her upper back  including her B shoulders. Mid back pain travels down her spine to her low back and feel radiating pain down her L lateral leg. R LE seems fine.   Denies loss of bowel or bladder control, or saddle anesthesia.  No falls within the last 6 months.  Low back and L lateral leg pain seems to have increased since onset.    Difficulty with history. Husband assist   Patient Stated Goals  Walk more comfortably. Stand longer than a minute at church.    Currently in Pain?  Yes    Pain Score  8     Pain Location  Knee    Pain Orientation  Left    Pain Onset  More than  a month ago                               PT Education - 08/12/18 1307    Education provided  Yes    Education Details  ther-ex    [employer]) Educated  Patient    Methods  Explanation;Demonstration;Tactile cues;Verbal cues    Comprehension  Returned demonstration;Verbalized understanding        Objective    MedbridgeAccess Code:Access Code: [email or web address]   Does not think she has latex allergies  Pt states that she and her husband have been walking a lot since the PT hiatus due to [place]. Also walked with her grandson    Therapeutic exercises  Seated L hip ER 10x3  Seated L hip extension with PT manual resistance 10x, then 5x  Rest break secondary to fatigue  Seated clamshell isometrics 10x5 seconds for 3 sets   Seated L knee flexion yellow band targeting the medial hamstrings 10x2  Seated L knee extension 5 lbs 5x3  Sit <> stand from slightly elevated mat table (21 inches) with B UE assist 5x2. Cues for placing and maintaining her center of gravity over her base of support. Pt tendency for backwards weight shifting onto her heels. Difficulty performing properly  Standing hip abduction with B UE assist   L 7x, then 6x. Difficult to perform     Improved exercise technique, movement at target joints, use of target muscles after mod verbal, visual, tactile cues.    Response to treatment Good muscle use felt with exercises. No mention increased L knee pain after session compared to start of session.    Clinical impression  Continued working on L glute and medial hamstrings muscle strengthening to promote better mechanics at the knee with standing tasks. Worked on L quadriceps strengthening as well as placing her center of gravity over her base of support to promote ability to support herself with her LE and also to promote balance and decrease fall risk. Pt demonstrates B LE weakness and difficulty with endurance with supporting  herself in the standing position. Pt will benefit from continued skilled physical therapy services to improve B LE and trunk strength, decrease pain, improve balance and ability to ambulate with less difficulty.      PT Short Term Goals - 08/08/18 1826      PT SHORT TERM GOAL #1   Title  Patient will be independent with her HEP to improve strength, decrease pain, improve function    Time  3    Period  Weeks    Status  On-going    Target Date  05/23/18        PT Long Term Goals - 08/08/18 1506      PT LONG TERM GOAL #1   Title  Patient will have a decrease in mid to low back pain to 5/10 or less at worst to promote ability to ambulate more comfortably and tolerate standing longer.     Baseline  8/10 back pain at worst for the past 2 months (possible greater than 8/10 at worst based on pt reports but pt seems to have difficulty with the pain scale; 04/30/2018); 5-6/10 mid to low back pain at most for the past month (08/08/2018)     Time  6    Period  Weeks    Status  On-going    Target Date  09/19/18      PT LONG TERM GOAL #2   Title  Pt will have a decrease in L lateral leg pain to 5/10 or less at worst  to promote ability to ambulate more comfortably and tolerate standing longer.     Baseline  10/10 at worst for the past 2 months (04/30/2018); 7-8/10 L lateral leg pain/tingling at worst for the past month (08/08/2018).     Time  6    Period  Weeks    Status  On-going    Target Date  09/19/18      PT LONG TERM GOAL #3   Title  Patient will have a decrease in L knee joint pain to 5/10 or less at worst  to promote ability to ambulate more comfortably and tolerate standing longer.     Baseline  10/10 L knee pain at worst for the past 2 months (04/30/2018); 8/10 at worst for the past month (08/08/2018)    Time  6    Period  Weeks    Status  On-going    Target Date  09/19/18      PT LONG TERM GOAL #4   Title  Pt will improve B LE strength by at least 1/2 MMT grade  to promote ability to  ambulate more comfortably and tolerate standing longer.     Time  6    Period  Weeks    Status  Partially Met    Target Date  09/19/18      PT LONG TERM GOAL #5   Title  Pt will report being able to tolerate standing to 5 minutes or more to promote ability to stand at church as well as improve ability to perform chores.     Baseline  Pt able to tolerate standing 1 min per subjective reports. (04/30/2018); half a minute per pt (08/08/2018)    Time  6    Period  Weeks    Status  On-going    Target Date  09/19/18            Plan - 08/12/18 1255    Clinical Impression Statement  Continued working on L glute and medial hamstrings muscle strengthening to promote better mechanics at the knee with standing tasks. Worked on L quadriceps strengthening as well as placing her center of gravity over her base of support to promote ability to support herself with her LE and also to promote balance and decrease fall risk. Pt demonstrates B LE weakness and difficulty with endurance with supporting herself in the standing position. Pt will benefit from continued skilled physical therapy services to  improve B LE and trunk strength, decrease pain, improve balance and ability to ambulate with less difficulty.     Personal Factors and Comorbidities  Age;Comorbidity 3+    Comorbidities  Memory deficit, rheumatoid arthritis, dyspnea, arthritis, B TKA, hx of hysterectomy    Examination-Activity Limitations  Squat;Stairs;Stand;Caring for Others;Carry;Transfers    Stability/Clinical Decision Making  Stable/Uncomplicated    Rehab Potential  Fair    Clinical Impairments Affecting Rehab Potential  Fall risk, age, co-morbidities, multiple areas of pain, LE weakness, decreased lumbar, pelvic, and femoral control    PT Frequency  2x / week    PT Duration  6 weeks    PT Treatment/Interventions  Aquatic Therapy;Cryotherapy;Moist Heat;Gait training;Stair training;Functional mobility training;Therapeutic  activities;Therapeutic exercise;Patient/family education;Neuromuscular re-education;Balance training;Manual techniques;Taping;Energy conservation;Passive range of motion;Electrical Stimulation;Iontophoresis 43m/ml Dexamethasone;Dry needling    Consulted and Agree with Plan of Care  Patient;Family member/caregiver    Family Member Consulted  husband       Patient will benefit from skilled therapeutic intervention in order to improve the following deficits and impairments:  Abnormal gait, Pain, Postural dysfunction, Decreased strength, Difficulty walking, Improper body mechanics, Decreased balance  Visit Diagnosis: Pain in thoracic spine  Chronic bilateral low back pain, unspecified whether sciatica present  Radiculopathy, lumbosacral region  Muscle weakness (generalized)  Difficulty in walking, not elsewhere classified  Unsteadiness on feet  Chronic pain of left knee     Problem List Patient Active Problem List   Diagnosis Date Noted  . Protein-calorie malnutrition ([place] 08/27/2017  . 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 Cubital canal compression syndrome, right 04/18/2017  . Primary osteoarthritis of knee 03/25/2017  . 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 Pure hypercholesterolemia 08/17/2016  . MDS (myelodysplastic syndrome) ([place] 05/12/2016  . B12 deficiency 05/04/2016  . Essential hypertension 05/04/2016  . Mixed stress and urge urinary incontinence 05/04/2016  . Rheumatoid arthritis involving multiple sites with positive rheumatoid factor ([place] 05/04/2016    MJoneen BoersPT, DPT   08/12/2018, 2:00 PM  [facility] [address] [place] [address]Phone: [phone]  Fax:  [phone] Name: JKynnedi ZweigMRN: [id number] of Birth: [birth year]

## 2018-08-14 ENCOUNTER — Other Ambulatory Visit: Payer: Self-pay

## 2018-08-14 ENCOUNTER — Ambulatory Visit: Payer: Medicare Other

## 2018-08-14 DIAGNOSIS — M545 Low back pain: Secondary | ICD-10-CM | POA: Diagnosis not present

## 2018-08-14 DIAGNOSIS — R2681 Unsteadiness on feet: Secondary | ICD-10-CM

## 2018-08-14 DIAGNOSIS — M546 Pain in thoracic spine: Secondary | ICD-10-CM

## 2018-08-14 DIAGNOSIS — M5417 Radiculopathy, lumbosacral region: Secondary | ICD-10-CM

## 2018-08-14 DIAGNOSIS — G8929 Other chronic pain: Secondary | ICD-10-CM

## 2018-08-14 DIAGNOSIS — M25562 Pain in left knee: Secondary | ICD-10-CM

## 2018-08-14 DIAGNOSIS — R262 Difficulty in walking, not elsewhere classified: Secondary | ICD-10-CM

## 2018-08-14 DIAGNOSIS — M6281 Muscle weakness (generalized): Secondary | ICD-10-CM

## 2018-08-14 NOTE — Therapy (Signed)
[facility] [address] [place], [address] Phone: [phone]   Fax:  [phone]  Physical Therapy Treatment And Progress Report (06/13/2018 - 08/14/2018)  Patient Details  Name: Sylvia Miller MRN: [id number] Date of Birth: 08-10-1940 Referring Provider (PT): Marlowe Sax, MD   Encounter Date: 08/14/2018  PT End of Session - 08/14/18 1305    Visit Number  10    Number of Visits  25    Date for PT Re-Evaluation  09/19/18    Authorization Type  10    Authorization Time Period  of 10 progress report (from 04/30/2018)    PT Start Time  1305    PT Stop Time  1345    PT Time Calculation (min)  40 min    Activity Tolerance  Patient tolerated treatment well    Behavior During Therapy  [facility] for tasks assessed/performed       Past Medical History:  Diagnosis Date  . Allergy   . Anemia   . Arthritis    rheumatoid /knees/hands/shoulders (Right shoulder) infusion every 5 weeks  . Bladder incontinence   . Chicken pox   . Dyspnea    with exertion or far walking  . Gastric ulcer   . GERD (gastroesophageal reflux disease)   . Heart murmur   . Hypercholesteremia   . Hyperlipidemia, unspecified   . Hypertension    controlled with meds;   Marland Kitchen MDS (myelodysplastic syndrome) ([place]) 2000  . Memory deficit   . Muscular dystrophy ([place])    per patient, this is incorrect  . Myelodysplastic syndrome ([place])   . Myelodysplastic syndrome ([place])   . Rheumatoid arthritis ([place])   . Ulcer     Past Surgical History:  Procedure Laterality Date  . ABDOMINAL HYSTERECTOMY    . APPENDECTOMY     as a child  . CHOLECYSTECTOMY    . COLONOSCOPY    . COLONOSCOPY WITH PROPOFOL N/A 04/24/2017   Procedure: COLONOSCOPY WITH PROPOFOL;  Surgeon: Lollie Sails, MD;  Location: [facility] ENDOSCOPY;  Service: Endoscopy;  Laterality: N/A;  . ESOPHAGOGASTRODUODENOSCOPY (EGD) WITH PROPOFOL N/A 04/24/2017   Procedure:  ESOPHAGOGASTRODUODENOSCOPY (EGD) WITH PROPOFOL;  Surgeon: Lollie Sails, MD;  Location: [facility] ENDOSCOPY;  Service: Endoscopy;  Laterality: N/A;  . JOINT REPLACEMENT    . KNEE ARTHROPLASTY Right 02/21/2017   Procedure: COMPUTER ASSISTED TOTAL KNEE ARTHROPLASTY;  Surgeon: Dereck Leep, MD;  Location: [facility] ORS;  Service: Orthopedics;  Laterality: Right;  . KNEE ARTHROPLASTY Left 08/06/2017   Procedure: COMPUTER ASSISTED TOTAL KNEE ARTHROPLASTY;  Surgeon: Dereck Leep, MD;  Location: [facility] ORS;  Service: Orthopedics;  Laterality: Left;  . NM INTRAVENOUS INJECTION ([facility] HX)     receives biologic infusions every 5 weeks for rheumatoid arthritis. followed by dr. Meda Coffee at [place] [facility]  . TONSILLECTOMY    . UPPER GASTROINTESTINAL ENDOSCOPY    . WISDOM TOOTH EXTRACTION      There were no vitals filed for this visit.  Subjective Assessment - 08/14/18 1307    Subjective  Doing ok. Not very energetic though. 4/10 L knee pain when walking. Back is not bothering her currently. Does not know when her last fall was.     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 Pt had L TKA 08/05/2017. Last 3-4 months pt has been having pain L lateral leg. Pt also states feeling pain in her upper back  including her B shoulders. Mid back  pain travels down her spine to her low back and feel radiating pain down her L lateral leg. R LE seems fine.   Denies loss of bowel or bladder control, or saddle anesthesia.  No falls within the last 6 months.  Low back and L lateral leg pain seems to have increased since onset.    Difficulty with history. Husband assist   Patient Stated Goals  Walk more comfortably. Stand longer than a minute at church.    Currently in Pain?  Yes    Pain Score  4     Pain Location  Knee    Pain Orientation  Left    Pain Onset  More than a month ago                               PT Education - 08/14/18 1312    Education provided  Yes     Education Details  ther-ex    [employer]) Educated  Patient    Methods  Explanation;Demonstration;Tactile cues;Verbal cues    Comprehension  Returned demonstration;Verbalized understanding         Objective    MedbridgeAccess Code:Access Code: [email or web address]   Does not think she has latex allergies    Therapeutic exercises  Seated L hip ER 10x3  Seated L hip extension with PT manual resistance 10x2          Seated clamshell isometrics 10x5 seconds for 2 sets  Seated L knee flexion yellow band targeting the medial hamstrings 10x3  Seated L knee extension 5 lbs 5x3  Standing hip abduction with B UE assist   L 10x, then 5x  Sit <> stand from regular chair with arms with B UE assist.  Cues for placing and maintaining her center of gravity over her base of support. Pt tendency for backwards lean  5x2   Rest breaks secondary to fatigue    Improved exercise technique, movement at target joints, use of target muscles after min to mod verbal, visual, tactile cues.    Manual therapy  Seated STM L vastus lateralis Seated STM L IT band Seated medial glide L patella grade 3- to promote mobility      Response to treatment Good muscle use felt with exercises. Pt tolerated session well without aggravation of symptoms.   Clinical impression Pt demonstrates decreased starting L knee pain today compared to previous session 2 days ago. Continued working on L glute med and max strengthening, medial hamstrings strengthening, femoral control, decreasing tension to tissues lateral to the knee to promote better mechanics at her knee when walking or performing standing tasks. Also worked on L quadriceps strengthening to promote ability to support herself with her L LE and to decrease fall risk. Pt will benefit from continued skilled physical therapy services to decrease back, knee and leg pain, improve LE strength, function, balance, and ability to ambulate.     PT  Short Term Goals - 08/08/18 1826      PT SHORT TERM GOAL #1   Title  Patient will be independent with her HEP to improve strength, decrease pain, improve function    Time  3    Period  Weeks    Status  On-going    Target Date  05/23/18        PT Long Term Goals - 08/08/18 1506      PT LONG TERM GOAL #1   Title  Patient will have a  decrease in mid to low back pain to 5/10 or less at worst to promote ability to ambulate more comfortably and tolerate standing longer.     Baseline  8/10 back pain at worst for the past 2 months (possible greater than 8/10 at worst based on pt reports but pt seems to have difficulty with the pain scale; 04/30/2018); 5-6/10 mid to low back pain at most for the past month (08/08/2018)     Time  6    Period  Weeks    Status  On-going    Target Date  09/19/18      PT LONG TERM GOAL #2   Title  Pt will have a decrease in L lateral leg pain to 5/10 or less at worst  to promote ability to ambulate more comfortably and tolerate standing longer.     Baseline  10/10 at worst for the past 2 months (04/30/2018); 7-8/10 L lateral leg pain/tingling at worst for the past month (08/08/2018).     Time  6    Period  Weeks    Status  On-going    Target Date  09/19/18      PT LONG TERM GOAL #3   Title  Patient will have a decrease in L knee joint pain to 5/10 or less at worst  to promote ability to ambulate more comfortably and tolerate standing longer.     Baseline  10/10 L knee pain at worst for the past 2 months (04/30/2018); 8/10 at worst for the past month (08/08/2018)    Time  6    Period  Weeks    Status  On-going    Target Date  09/19/18      PT LONG TERM GOAL #4   Title  Pt will improve B LE strength by at least 1/2 MMT grade  to promote ability to ambulate more comfortably and tolerate standing longer.     Time  6    Period  Weeks    Status  Partially Met    Target Date  09/19/18      PT LONG TERM GOAL #5   Title  Pt will report being able to tolerate standing  to 5 minutes or more to promote ability to stand at church as well as improve ability to perform chores.     Baseline  Pt able to tolerate standing 1 min per subjective reports. (04/30/2018); half a minute per pt (08/08/2018)    Time  6    Period  Weeks    Status  On-going    Target Date  09/19/18            Plan - 08/14/18 1302    Clinical Impression Statement  Pt demonstrates decreased starting L knee pain today compared to previous session 2 days ago. Continued working on L glute med and max strengthening, medial hamstrings strengthening, femoral control, decreasing tension to tissues lateral to the knee to promote better mechanics at her knee when walking or performing standing tasks. Also worked on L quadriceps strengthening to promote ability to support herself with her L LE and to decrease fall risk. Pt will benefit from continued skilled physical therapy services to decrease back, knee and leg pain, improve LE strength, function, balance, and ability to ambulate.     Personal Factors and Comorbidities  Age;Comorbidity 3+    Comorbidities  Memory deficit, rheumatoid arthritis, dyspnea, arthritis, B TKA, hx of hysterectomy    Examination-Activity Limitations  Squat;Stairs;Stand;Caring for Others;Carry;Transfers  Stability/Clinical Decision Making  Stable/Uncomplicated    Rehab Potential  Fair    Clinical Impairments Affecting Rehab Potential  Fall risk, age, co-morbidities, multiple areas of pain, LE weakness, decreased lumbar, pelvic, and femoral control    PT Frequency  2x / week    PT Duration  6 weeks    PT Treatment/Interventions  Aquatic Therapy;Cryotherapy;Moist Heat;Gait training;Stair training;Functional mobility training;Therapeutic activities;Therapeutic exercise;Patient/family education;Neuromuscular re-education;Balance training;Manual techniques;Taping;Energy conservation;Passive range of motion;Electrical Stimulation;Iontophoresis 28m/ml Dexamethasone;Dry needling     Consulted and Agree with Plan of Care  Patient;Family member/caregiver    Family Member Consulted  husband       Patient will benefit from skilled therapeutic intervention in order to improve the following deficits and impairments:  Abnormal gait, Pain, Postural dysfunction, Decreased strength, Difficulty walking, Improper body mechanics, Decreased balance  Visit Diagnosis: Pain in thoracic spine  Chronic bilateral low back pain, unspecified whether sciatica present  Radiculopathy, lumbosacral region  Muscle weakness (generalized)  Difficulty in walking, not elsewhere classified  Unsteadiness on feet  Chronic pain of left knee     Problem List Patient Active Problem List   Diagnosis Date Noted  . Protein-calorie malnutrition ([place] 08/27/2017  . 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 Cubital canal compression syndrome, right 04/18/2017  . Primary osteoarthritis of knee 03/25/2017  . 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 Pure hypercholesterolemia 08/17/2016  . MDS (myelodysplastic syndrome) ([place] 05/12/2016  . B12 deficiency 05/04/2016  . Essential hypertension 05/04/2016  . Mixed stress and urge urinary incontinence 05/04/2016  . Rheumatoid arthritis involving multiple sites with positive rheumatoid factor ([place] 05/04/2016    Sylvia BoersPT, DPT   08/14/2018, 4:15 PM  [place] [facility] [address] [place] [address]Phone: [phone]  Fax:  [phone] Name: JLuwanna BrossmanMRN: [id number] of Birth: [birth year]

## 2018-08-19 ENCOUNTER — Ambulatory Visit: Payer: Medicare Other

## 2018-08-19 ENCOUNTER — Other Ambulatory Visit: Payer: Self-pay

## 2018-08-19 DIAGNOSIS — G8929 Other chronic pain: Secondary | ICD-10-CM

## 2018-08-19 DIAGNOSIS — M6281 Muscle weakness (generalized): Secondary | ICD-10-CM

## 2018-08-19 DIAGNOSIS — M5417 Radiculopathy, lumbosacral region: Secondary | ICD-10-CM

## 2018-08-19 DIAGNOSIS — M546 Pain in thoracic spine: Secondary | ICD-10-CM

## 2018-08-19 DIAGNOSIS — R2681 Unsteadiness on feet: Secondary | ICD-10-CM

## 2018-08-19 DIAGNOSIS — R262 Difficulty in walking, not elsewhere classified: Secondary | ICD-10-CM

## 2018-08-19 DIAGNOSIS — M545 Low back pain: Secondary | ICD-10-CM | POA: Diagnosis not present

## 2018-08-19 NOTE — Therapy (Signed)
[place] [facility] [address], [place], [address] Phone: [phone]   Fax:  [phone]  Physical Therapy Treatment  Patient Details  Name: Sylvia Miller MRN: [id number] Date of Birth: [birth year] Referring Provider (PT): Marlowe Sax, MD   Encounter Date: 08/19/2018  PT End of Session - 08/19/18 1258    Visit Number  11    Number of Visits  25    Date for PT Re-Evaluation  09/19/18    Authorization Type  1    Authorization Time Period  of 10 progress report (from 04/30/2018)    PT Start Time  1259    PT Stop Time  1343    PT Time Calculation (min)  44 min    Activity Tolerance  Patient tolerated treatment well    Behavior During Therapy  [facility] for tasks assessed/performed       Past Medical History:  Diagnosis Date  . Allergy   . Anemia   . Arthritis    rheumatoid /knees/hands/shoulders (Right shoulder) infusion every 5 weeks  . Bladder incontinence   . Chicken pox   . Dyspnea    with exertion or far walking  . Gastric ulcer   . GERD (gastroesophageal reflux disease)   . Heart murmur   . Hypercholesteremia   . Hyperlipidemia, unspecified   . Hypertension    controlled with meds;   Marland Kitchen MDS (myelodysplastic syndrome) ([place]) 2000  . Memory deficit   . Muscular dystrophy ([place])    per patient, this is incorrect  . Myelodysplastic syndrome ([place])   . Myelodysplastic syndrome ([place])   . Rheumatoid arthritis ([place])   . Ulcer     Past Surgical History:  Procedure Laterality Date  . ABDOMINAL HYSTERECTOMY    . APPENDECTOMY     as a child  . CHOLECYSTECTOMY    . COLONOSCOPY    . COLONOSCOPY WITH PROPOFOL N/A 04/24/2017   Procedure: COLONOSCOPY WITH PROPOFOL;  Surgeon: Lollie Sails, MD;  Location: [facility] ENDOSCOPY;  Service: Endoscopy;  Laterality: N/A;  . ESOPHAGOGASTRODUODENOSCOPY (EGD) WITH PROPOFOL N/A 04/24/2017   Procedure: ESOPHAGOGASTRODUODENOSCOPY (EGD) WITH PROPOFOL;  Surgeon:  Lollie Sails, MD;  Location: [facility] ENDOSCOPY;  Service: Endoscopy;  Laterality: N/A;  . JOINT REPLACEMENT    . KNEE ARTHROPLASTY Right 02/21/2017   Procedure: COMPUTER ASSISTED TOTAL KNEE ARTHROPLASTY;  Surgeon: Dereck Leep, MD;  Location: [facility] ORS;  Service: Orthopedics;  Laterality: Right;  . KNEE ARTHROPLASTY Left 08/06/2017   Procedure: COMPUTER ASSISTED TOTAL KNEE ARTHROPLASTY;  Surgeon: Dereck Leep, MD;  Location: [facility] ORS;  Service: Orthopedics;  Laterality: Left;  . NM INTRAVENOUS INJECTION ([facility] HX)     receives biologic infusions every 5 weeks for rheumatoid arthritis. followed by dr. Meda Coffee at [facility]  . TONSILLECTOMY    . UPPER GASTROINTESTINAL ENDOSCOPY    . WISDOM TOOTH EXTRACTION      There were no vitals filed for this visit.  Subjective Assessment - 08/19/18 1303    Subjective  Doing pretty good. Back seems to be ok. Shoulders are bothering her. Does not know why... maybe the dampness. L knee hurts, 6-7/10 ache when walking. The weather (raining) does not help. L lateral leg is a littls sore. 6/10 L leg soreness currently.    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 Pt had L TKA 08/05/2017. Last 3-4 months pt has been having pain L lateral leg. Pt also states feeling pain  in her upper back  including her B shoulders. Mid back pain travels down her spine to her low back and feel radiating pain down her L lateral leg. R LE seems fine.   Denies loss of bowel or bladder control, or saddle anesthesia.  No falls within the last 6 months.  Low back and L lateral leg pain seems to have increased since onset.    Difficulty with history. Husband assist   Patient Stated Goals  Walk more comfortably. Stand longer than a minute at church.    Currently in Pain?  Yes    Pain Score  7    6-7/10 L knee and 6/10 L lateral leg   Pain Onset  More than a month ago                               PT Education - 08/19/18 1308     Education provided  Yes    Education Details  ther-ex    [employer]) Educated  Patient    Methods  Explanation;Demonstration;Tactile cues;Verbal cues    Comprehension  Returned demonstration;Verbalized understanding      Objective    MedbridgeAccess Code:Access Code: [email or web address]   Does not think she has latex allergies    Therapeutic exercises  Seated L hip ER 10x3 with 5 second holds  Seated L knee flexion yellow band targeting the medial hamstrings 10x3  Seated L knee extension 5 lbs 5x4  Seated clamshell isometrics 10x5 seconds for 3 sets  Seated L hip extension with PT manual resistance 10x2  Sit <> stand from low mat table with B UE assist 5x2  Cues for placing and maintaining her center of gravity over her base of support. Pt tendency for backwards lean    Standing hip abduction with B UE assist              L 10x, then 8x    Rest breaks secondary to fatigue    Improved exercise technique, movement at target joints, use of target muscles after min to mod verbal, visual, tactile cues.    Manual therapy  Seated STM L vastus lateralis Seated STM L IT band Seated medial glide L patella grade 3- to promote mobility seated STM L lateral hamstrings     Response to treatment Good muscle use felt with exercises.Pt tolerated session well without aggravation of symptoms. No L knee or leg pain with gait at end of session.   Clinical impression Continued working on L glute med, max, quadriceps, and medial hamstrings strengthening as well as decreasing L vastus lateralis, lateral hamstrings and IT band tension to promote better mechanics at her L knee joint when performing standing tasks or when walking. Pt tolerated session well without aggravation of symptoms. Pt demonstrates weakness with glute med < max muscle strength palpated. Pt will benefit from continued skilled physical therapy services to decreased pain, improve  strength, function and decrease difficulty with gait. No complain of L knee or leg pain with gait after session.     PT Short Term Goals - 08/08/18 1826      PT SHORT TERM GOAL #1   Title  Patient will be independent with her HEP to improve strength, decrease pain, improve function    Time  3    Period  Weeks    Status  On-going    Target Date  05/23/18        PT Long Term  Goals - 08/08/18 1506      PT LONG TERM GOAL #1   Title  Patient will have a decrease in mid to low back pain to 5/10 or less at worst to promote ability to ambulate more comfortably and tolerate standing longer.     Baseline  8/10 back pain at worst for the past 2 months (possible greater than 8/10 at worst based on pt reports but pt seems to have difficulty with the pain scale; 04/30/2018); 5-6/10 mid to low back pain at most for the past month (08/08/2018)     Time  6    Period  Weeks    Status  On-going    Target Date  09/19/18      PT LONG TERM GOAL #2   Title  Pt will have a decrease in L lateral leg pain to 5/10 or less at worst  to promote ability to ambulate more comfortably and tolerate standing longer.     Baseline  10/10 at worst for the past 2 months (04/30/2018); 7-8/10 L lateral leg pain/tingling at worst for the past month (08/08/2018).     Time  6    Period  Weeks    Status  On-going    Target Date  09/19/18      PT LONG TERM GOAL #3   Title  Patient will have a decrease in L knee joint pain to 5/10 or less at worst  to promote ability to ambulate more comfortably and tolerate standing longer.     Baseline  10/10 L knee pain at worst for the past 2 months (04/30/2018); 8/10 at worst for the past month (08/08/2018)    Time  6    Period  Weeks    Status  On-going    Target Date  09/19/18      PT LONG TERM GOAL #4   Title  Pt will improve B LE strength by at least 1/2 MMT grade  to promote ability to ambulate more comfortably and tolerate standing longer.     Time  6    Period  Weeks    Status   Partially Met    Target Date  09/19/18      PT LONG TERM GOAL #5   Title  Pt will report being able to tolerate standing to 5 minutes or more to promote ability to stand at church as well as improve ability to perform chores.     Baseline  Pt able to tolerate standing 1 min per subjective reports. (04/30/2018); half a minute per pt (08/08/2018)    Time  6    Period  Weeks    Status  On-going    Target Date  09/19/18            Plan - 08/19/18 1315    Clinical Impression Statement  Continued working on L glute med, max, quadriceps, and medial hamstrings strengthening as well as decreasing L vastus lateralis, lateral hamstrings and IT band tension to promote better mechanics at her L knee joint when performing standing tasks or when walking. Pt tolerated session well without aggravation of symptoms. Pt demonstrates weakness with glute med < max muscle strength palpated. Pt will benefit from continued skilled physical therapy services to decreased pain, improve strength, function and decrease difficulty with gait. No complain of L knee or leg pain with gait after session.    Personal Factors and Comorbidities  Age;Comorbidity 3+    Comorbidities  Memory deficit, rheumatoid arthritis, dyspnea,  arthritis, B TKA, hx of hysterectomy    Examination-Activity Limitations  Squat;Stairs;Stand;Caring for Others;Carry;Transfers    Stability/Clinical Decision Making  Stable/Uncomplicated    Rehab Potential  Fair    Clinical Impairments Affecting Rehab Potential  Fall risk, age, co-morbidities, multiple areas of pain, LE weakness, decreased lumbar, pelvic, and femoral control    PT Frequency  2x / week    PT Duration  6 weeks    PT Treatment/Interventions  Aquatic Therapy;Cryotherapy;Moist Heat;Gait training;Stair training;Functional mobility training;Therapeutic activities;Therapeutic exercise;Patient/family education;Neuromuscular re-education;Balance training;Manual techniques;Taping;Energy  conservation;Passive range of motion;Electrical Stimulation;Iontophoresis 52m/ml Dexamethasone;Dry needling    Consulted and Agree with Plan of Care  Patient;Family member/caregiver    Family Member Consulted  husband       Patient will benefit from skilled therapeutic intervention in order to improve the following deficits and impairments:  Abnormal gait, Pain, Postural dysfunction, Decreased strength, Difficulty walking, Improper body mechanics, Decreased balance  Visit Diagnosis: Radiculopathy, lumbosacral region - Plan:   Pain in thoracic spine - Plan:   Muscle weakness (generalized) - Plan:   Difficulty in walking, not elsewhere classified - Plan:   Unsteadiness on feet - Plan:   Chronic bilateral low back pain, unspecified whether sciatica present - Plan:   Chronic pain of left knee - Plan:      Problem List Patient Active Problem List   Diagnosis Date Noted  . Protein-calorie malnutrition ([place] 08/27/2017  . 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 Cubital canal compression syndrome, right 04/18/2017  . Primary osteoarthritis of knee 03/25/2017  . 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 Pure hypercholesterolemia 08/17/2016  . MDS (myelodysplastic syndrome) ([place] 05/12/2016  . B12 deficiency 05/04/2016  . Essential hypertension 05/04/2016  . Mixed stress and urge urinary incontinence 05/04/2016  . Rheumatoid arthritis involving multiple sites with positive rheumatoid factor ([place] 05/04/2016    MJoneen BoersPT, DPT   08/19/2018, 2:03 PM  [facility] [address] [place] [address]Phone: [phone]  Fax:  [phone] Name: JLogan VeghMRN: [id number] of Birth: [birth year]

## 2018-08-21 ENCOUNTER — Other Ambulatory Visit: Payer: Self-pay

## 2018-08-21 ENCOUNTER — Ambulatory Visit: Payer: Medicare Other

## 2018-08-21 DIAGNOSIS — M5417 Radiculopathy, lumbosacral region: Secondary | ICD-10-CM

## 2018-08-21 DIAGNOSIS — M546 Pain in thoracic spine: Secondary | ICD-10-CM

## 2018-08-21 DIAGNOSIS — M6281 Muscle weakness (generalized): Secondary | ICD-10-CM

## 2018-08-21 DIAGNOSIS — M545 Low back pain, unspecified: Secondary | ICD-10-CM

## 2018-08-21 DIAGNOSIS — M25562 Pain in left knee: Secondary | ICD-10-CM

## 2018-08-21 DIAGNOSIS — R2681 Unsteadiness on feet: Secondary | ICD-10-CM

## 2018-08-21 DIAGNOSIS — G8929 Other chronic pain: Secondary | ICD-10-CM

## 2018-08-21 DIAGNOSIS — R262 Difficulty in walking, not elsewhere classified: Secondary | ICD-10-CM

## 2018-08-21 NOTE — Therapy (Signed)
[facility] [address] [place], [address] Phone: [phone]   Fax:  [phone]  Physical Therapy Treatment  Patient Details  Name: Sylvia Miller MRN: [id number] Date of Birth: December 31, 1940 Referring Provider (PT): Marlowe Sax, MD   Encounter Date: 08/21/2018  PT End of Session - 08/21/18 1301    Visit Number  12    Number of Visits  25    Date for PT Re-Evaluation  09/19/18    Authorization Type  2    Authorization Time Period  of 10 progress report (from 04/30/2018)    PT Start Time  1302    PT Stop Time  1347    PT Time Calculation (min)  45 min    Activity Tolerance  Patient tolerated treatment well    Behavior During Therapy  [facility] for tasks assessed/performed       Past Medical History:  Diagnosis Date  . Allergy   . Anemia   . Arthritis    rheumatoid /knees/hands/shoulders (Right shoulder) infusion every 5 weeks  . Bladder incontinence   . Chicken pox   . Dyspnea    with exertion or far walking  . Gastric ulcer   . GERD (gastroesophageal reflux disease)   . Heart murmur   . Hypercholesteremia   . Hyperlipidemia, unspecified   . Hypertension    controlled with meds;   Marland Kitchen MDS (myelodysplastic syndrome) ([place]) 2000  . Memory deficit   . Muscular dystrophy ([place])    per patient, this is incorrect  . Myelodysplastic syndrome ([place])   . Myelodysplastic syndrome ([place])   . Rheumatoid arthritis ([place])   . Ulcer     Past Surgical History:  Procedure Laterality Date  . ABDOMINAL HYSTERECTOMY    . APPENDECTOMY     as a child  . CHOLECYSTECTOMY    . COLONOSCOPY    . COLONOSCOPY WITH PROPOFOL N/A 04/24/2017   Procedure: COLONOSCOPY WITH PROPOFOL;  Surgeon: Lollie Sails, MD;  Location: [facility] ENDOSCOPY;  Service: Endoscopy;  Laterality: N/A;  . ESOPHAGOGASTRODUODENOSCOPY (EGD) WITH PROPOFOL N/A 04/24/2017   Procedure: ESOPHAGOGASTRODUODENOSCOPY (EGD) WITH PROPOFOL;  Surgeon:  Lollie Sails, MD;  Location: [facility] ENDOSCOPY;  Service: Endoscopy;  Laterality: N/A;  . JOINT REPLACEMENT    . KNEE ARTHROPLASTY Right 02/21/2017   Procedure: COMPUTER ASSISTED TOTAL KNEE ARTHROPLASTY;  Surgeon: Dereck Leep, MD;  Location: [facility] ORS;  Service: Orthopedics;  Laterality: Right;  . KNEE ARTHROPLASTY Left 08/06/2017   Procedure: COMPUTER ASSISTED TOTAL KNEE ARTHROPLASTY;  Surgeon: Dereck Leep, MD;  Location: [facility] ORS;  Service: Orthopedics;  Laterality: Left;  . NM INTRAVENOUS INJECTION ([facility] HX)     receives biologic infusions every 5 weeks for rheumatoid arthritis. followed by dr. Meda Coffee at [facility]  . TONSILLECTOMY    . UPPER GASTROINTESTINAL ENDOSCOPY    . WISDOM TOOTH EXTRACTION      There were no vitals filed for this visit.  Subjective Assessment - 08/21/18 1304    Subjective  L posterior thigh, knee, and lateral leg are sore (7/10 soreness). The L knee joint is ok. Did not fall or anything.    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 Pt had L TKA 08/05/2017. Last 3-4 months pt has been having pain L lateral leg. Pt also states feeling pain in her upper back  including her B shoulders. Mid back pain travels down her spine to her low back and feel  radiating pain down her L lateral leg. R LE seems fine.   Denies loss of bowel or bladder control, or saddle anesthesia.  No falls within the last 6 months.  Low back and L lateral leg pain seems to have increased since onset.    Difficulty with history. Husband assist   Patient Stated Goals  Walk more comfortably. Stand longer than a minute at church.    Currently in Pain?  Yes    Pain Score  7     Pain Onset  More than a month ago                               PT Education - 08/21/18 1310    Education provided  Yes    Education Details  ther-ex    [employer]) Educated  Patient    Methods  Explanation;Demonstration;Tactile cues;Verbal cues    Comprehension   Returned demonstration;Verbalized understanding      Objective    MedbridgeAccess Code:Access Code: [email or web address]   Does not think she has latex allergies    Therapeutic exercises  Seated trunk flexion. Slight increase in L LE soreness   Seated hip adduction folded pillow squeeze 10x5 seconds for 2 sets   Seated manually resisted trunk extension in neutral 10x5 seconds for 2 sets  Decreased L medial leg soreness  Seated L hip extension isometrics to promote glute max strengthening 10x5 seconds for 3 sets  Decreased L posterior thigh soreness  Seated trunk flexion isometrics in neutral 7x5 seconds, 5x5 seconds  Seated R hip flexion 10x2, then 10x 5 second holds with PT manual resistance to promote posterior L pelvic tilt.   Seated B ankle DF/PF 10x3 to promote neural flossing  Standing hip abduction with B UE assist   L 10x2  Seated manually resisted L lateral shift in neutral to counter R lateral shift posture 10x5 seconds for 2 sets   Gait 20 ft with SPC. Decreased L lateral leg tingling per pt.   Try closed chain L LE strengthening next visit if appropriate.    Improved exercise technique, movement at target joints, use of target muscles after mod verbal, visual, tactile cues.    Manual therapy  Seated STM to B lumbar paraspinal muscles       Response to treatment Good muscle use felt with exercises.Pt tolerated session well without aggravation of symptoms.   Clinical impression Decreased L LE symptoms with gentle trunk extension related movements and L glute strengthening. Significant trunk weakness and difficulty with endurance observed with exercises. Continued working on trunk and LE strengthening to promote ability to perform standing tasks and walking with less difficulty.       PT Short Term Goals - 08/08/18 1826      PT SHORT TERM GOAL #1   Title  Patient will be independent with her HEP to improve strength, decrease pain,  improve function    Time  3    Period  Weeks    Status  On-going    Target Date  05/23/18        PT Long Term Goals - 08/08/18 1506      PT LONG TERM GOAL #1   Title  Patient will have a decrease in mid to low back pain to 5/10 or less at worst to promote ability to ambulate more comfortably and tolerate standing longer.     Baseline  8/10 back pain at worst for the  past 2 months (possible greater than 8/10 at worst based on pt reports but pt seems to have difficulty with the pain scale; 04/30/2018); 5-6/10 mid to low back pain at most for the past month (08/08/2018)     Time  6    Period  Weeks    Status  On-going    Target Date  09/19/18      PT LONG TERM GOAL #2   Title  Pt will have a decrease in L lateral leg pain to 5/10 or less at worst  to promote ability to ambulate more comfortably and tolerate standing longer.     Baseline  10/10 at worst for the past 2 months (04/30/2018); 7-8/10 L lateral leg pain/tingling at worst for the past month (08/08/2018).     Time  6    Period  Weeks    Status  On-going    Target Date  09/19/18      PT LONG TERM GOAL #3   Title  Patient will have a decrease in L knee joint pain to 5/10 or less at worst  to promote ability to ambulate more comfortably and tolerate standing longer.     Baseline  10/10 L knee pain at worst for the past 2 months (04/30/2018); 8/10 at worst for the past month (08/08/2018)    Time  6    Period  Weeks    Status  On-going    Target Date  09/19/18      PT LONG TERM GOAL #4   Title  Pt will improve B LE strength by at least 1/2 MMT grade  to promote ability to ambulate more comfortably and tolerate standing longer.     Time  6    Period  Weeks    Status  Partially Met    Target Date  09/19/18      PT LONG TERM GOAL #5   Title  Pt will report being able to tolerate standing to 5 minutes or more to promote ability to stand at church as well as improve ability to perform chores.     Baseline  Pt able to tolerate standing 1  min per subjective reports. (04/30/2018); half a minute per pt (08/08/2018)    Time  6    Period  Weeks    Status  On-going    Target Date  09/19/18            Plan - 08/21/18 1254    Clinical Impression Statement  Decreased L LE symptoms with gentle trunk extension related movements and L glute strengthening. Significant trunk weakness and difficulty with endurance observed with exercises. Continued working on trunk and LE strengthening to promote ability to perform standing tasks and walking with less difficulty.    Personal Factors and Comorbidities  Age;Comorbidity 3+    Comorbidities  Memory deficit, rheumatoid arthritis, dyspnea, arthritis, B TKA, hx of hysterectomy    Examination-Activity Limitations  Squat;Stairs;Stand;Caring for Others;Carry;Transfers    Stability/Clinical Decision Making  Stable/Uncomplicated    Rehab Potential  Fair    Clinical Impairments Affecting Rehab Potential  Fall risk, age, co-morbidities, multiple areas of pain, LE weakness, decreased lumbar, pelvic, and femoral control    PT Frequency  2x / week    PT Duration  6 weeks    PT Treatment/Interventions  Aquatic Therapy;Cryotherapy;Moist Heat;Gait training;Stair training;Functional mobility training;Therapeutic activities;Therapeutic exercise;Patient/family education;Neuromuscular re-education;Balance training;Manual techniques;Taping;Energy conservation;Passive range of motion;Electrical Stimulation;Iontophoresis 26m/ml Dexamethasone;Dry needling    Consulted and Agree with Plan of  Care  Patient;Family member/caregiver    Family Member Consulted  husband       Patient will benefit from skilled therapeutic intervention in order to improve the following deficits and impairments:  Abnormal gait, Pain, Postural dysfunction, Decreased strength, Difficulty walking, Improper body mechanics, Decreased balance  Visit Diagnosis: 1. Radiculopathy, lumbosacral region   2. Pain in thoracic spine   3. Muscle  weakness (generalized)   4. Difficulty in walking, not elsewhere classified   5. Unsteadiness on feet   6. Chronic bilateral low back pain, unspecified whether sciatica present   7. Chronic pain of left knee        Problem List Patient Active Problem List   Diagnosis Date Noted  . Protein-calorie malnutrition ([place]) 08/27/2017  . 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 Cubital canal compression syndrome, right 04/18/2017  . Primary osteoarthritis of knee 03/25/2017  . 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 Pure hypercholesterolemia 08/17/2016  . MDS (myelodysplastic syndrome) ([place]) 05/12/2016  . B12 deficiency 05/04/2016  . Essential hypertension 05/04/2016  . Mixed stress and urge urinary incontinence 05/04/2016  . Rheumatoid arthritis involving multiple sites with positive rheumatoid factor ([place]) 05/04/2016     Joneen Boers PT, DPT  08/21/2018, 2:05 PM  [facility] [address], [place], [address] Phone: [phone]   Fax:  [phone]  Name: Fama Muenchow MRN: [id number] Date of Birth: February 23, 1941

## 2018-08-26 ENCOUNTER — Ambulatory Visit: Payer: Medicare Other

## 2018-08-28 ENCOUNTER — Ambulatory Visit: Payer: Medicare Other

## 2018-09-02 ENCOUNTER — Other Ambulatory Visit: Payer: Self-pay

## 2018-09-02 ENCOUNTER — Ambulatory Visit: Payer: Medicare Other

## 2018-09-02 DIAGNOSIS — R262 Difficulty in walking, not elsewhere classified: Secondary | ICD-10-CM

## 2018-09-02 DIAGNOSIS — M546 Pain in thoracic spine: Secondary | ICD-10-CM

## 2018-09-02 DIAGNOSIS — R2681 Unsteadiness on feet: Secondary | ICD-10-CM

## 2018-09-02 DIAGNOSIS — M6281 Muscle weakness (generalized): Secondary | ICD-10-CM

## 2018-09-02 DIAGNOSIS — M5417 Radiculopathy, lumbosacral region: Secondary | ICD-10-CM

## 2018-09-02 DIAGNOSIS — G8929 Other chronic pain: Secondary | ICD-10-CM

## 2018-09-02 DIAGNOSIS — M545 Low back pain: Secondary | ICD-10-CM | POA: Diagnosis not present

## 2018-09-02 NOTE — Therapy (Signed)
[facility] [address] [place], [address] Phone: [phone]   Fax:  [phone]  Physical Therapy Treatment  Patient Details  Name: Sylvia Miller MRN: [id number] Date of Birth: Mar 12, 1940 Referring Provider (PT): Marlowe Sax, MD   Encounter Date: 09/02/2018  PT End of Session - 09/02/18 1304    Visit Number  13    Number of Visits  25    Date for PT Re-Evaluation  09/19/18    Authorization Type  3    Authorization Time Period  of 10 progress report (from 04/30/2018)    PT Start Time  1304    PT Stop Time  1345    PT Time Calculation (min)  41 min    Activity Tolerance  Patient tolerated treatment well    Behavior During Therapy  [facility] for tasks assessed/performed       Past Medical History:  Diagnosis Date  . Allergy   . Anemia   . Arthritis    rheumatoid /knees/hands/shoulders (Right shoulder) infusion every 5 weeks  . Bladder incontinence   . Chicken pox   . Dyspnea    with exertion or far walking  . Gastric ulcer   . GERD (gastroesophageal reflux disease)   . Heart murmur   . Hypercholesteremia   . Hyperlipidemia, unspecified   . Hypertension    controlled with meds;   Marland Kitchen MDS (myelodysplastic syndrome) ([place]) 2000  . Memory deficit   . Muscular dystrophy ([place])    per patient, this is incorrect  . Myelodysplastic syndrome ([place])   . Myelodysplastic syndrome ([place])   . Rheumatoid arthritis ([place])   . Ulcer     Past Surgical History:  Procedure Laterality Date  . ABDOMINAL HYSTERECTOMY    . APPENDECTOMY     as a child  . CHOLECYSTECTOMY    . COLONOSCOPY    . COLONOSCOPY WITH PROPOFOL N/A 04/24/2017   Procedure: COLONOSCOPY WITH PROPOFOL;  Surgeon: Lollie Sails, MD;  Location: [facility] ENDOSCOPY;  Service: Endoscopy;  Laterality: N/A;  . ESOPHAGOGASTRODUODENOSCOPY (EGD) WITH PROPOFOL N/A 04/24/2017   Procedure: ESOPHAGOGASTRODUODENOSCOPY (EGD) WITH PROPOFOL;  Surgeon:  Lollie Sails, MD;  Location: [facility] ENDOSCOPY;  Service: Endoscopy;  Laterality: N/A;  . JOINT REPLACEMENT    . KNEE ARTHROPLASTY Right 02/21/2017   Procedure: COMPUTER ASSISTED TOTAL KNEE ARTHROPLASTY;  Surgeon: Dereck Leep, MD;  Location: [facility] ORS;  Service: Orthopedics;  Laterality: Right;  . KNEE ARTHROPLASTY Left 08/06/2017   Procedure: COMPUTER ASSISTED TOTAL KNEE ARTHROPLASTY;  Surgeon: Dereck Leep, MD;  Location: [facility] ORS;  Service: Orthopedics;  Laterality: Left;  . NM INTRAVENOUS INJECTION ([facility] HX)     receives biologic infusions every 5 weeks for rheumatoid arthritis. followed by dr. Meda Coffee at [facility]  . TONSILLECTOMY    . UPPER GASTROINTESTINAL ENDOSCOPY    . WISDOM TOOTH EXTRACTION      There were no vitals filed for this visit.  Subjective Assessment - 09/02/18 1306    Subjective  The back of her L thigh bothers her (sciatic distribution). Also feels symptoms down the back of her L leg. 7-8/10 L sciatic symptoms. L knee joint just feels stiff and achy. Sat on a chair for a long time working on taxes earlier.    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 Pt had L TKA 08/05/2017. Last 3-4 months pt has been having pain L lateral leg. Pt also states feeling pain  in her upper back  including her B shoulders. Mid back pain travels down her spine to her low back and feel radiating pain down her L lateral leg. R LE seems fine.   Denies loss of bowel or bladder control, or saddle anesthesia.  No falls within the last 6 months.  Low back and L lateral leg pain seems to have increased since onset.    Difficulty with history. Husband assist   Patient Stated Goals  Walk more comfortably. Stand longer than a minute at church.    Currently in Pain?  Yes    Pain Score  8    L LE sciatic distribution to ankle   Pain Onset  More than a month ago                               PT Education - 09/02/18 1310    Education provided   Yes    Education Details  ther-ex    [employer]) Educated  Patient    Methods  Explanation;Demonstration;Tactile cues;Verbal cues    Comprehension  Returned demonstration;Verbalized understanding       Objective    MedbridgeAccess Code:Access Code: [email or web address]   Does not think she has latex allergies  Sitting posture: L trunk rotation    Therapeutic exercises  Sitting with lumbar towel roll on chair. Increased symptoms. Eases when towel roll removed.   Seated L hip extension isometrics 10x5 seconds for 2 sets. No change in L LE symptoms  Seated manually resisted trunk extension in neutral 10x5 seconds for 3 sets          Standing L hip abduction with B UE assist to promote glute med muscle strengthening 7x. Increased L LE sciatic symptoms  Seated clamshell isometrics (hips less than 90 degrees flexion) 10x5 seconds for 3 sets  Seated L hip extension manually resisted by PT 10x3 to promote glute max muscle strengthening  Seated R hip flexion 10x2, then 10x 5 second holds with PT manual resistance to promote posterior L pelvic tilt.    Decreased L posterior hip, knee and thigh soreness after aforementioned exercises  Seated trunk flexion isometrics in neutral 10x5 seconds   Possible increase in posterior LE soreness  Seated hip adduction folded pillow squeeze 10x5 seconds for 2 sets   Seated R trunk rotation 10x3. Decreased L LE symptoms  Assist to decrease anterior pelvic tilt and R lateral shifting.       Try closed chain L LE strengthening next visit if appropriate.    Improved exercise technique, movement at target joints, use of target muscles after mod verbal, visual, tactile cues.       Response to treatment Good muscle use felt with exercises.Fair tolerance to exercises.    Clinical impression Decreased L sciatic symptoms with exercises to promote glute med and max strengthening as well as with decreasing L trunk rotation  position. Weak trunk muscles and control observed during exercises. Pt also demonstrates weak L LE strength felt during manually resisted exercises. Pt will benefit from continued skilled physical therapy services to decrease pain, improve strength, function, and ability to ambulate.       PT Short Term Goals - 08/08/18 1826      PT SHORT TERM GOAL #1   Title  Patient will be independent with her HEP to improve strength, decrease pain, improve function    Time  3    Period  Weeks  Status  On-going    Target Date  05/23/18        PT Long Term Goals - 08/08/18 1506      PT LONG TERM GOAL #1   Title  Patient will have a decrease in mid to low back pain to 5/10 or less at worst to promote ability to ambulate more comfortably and tolerate standing longer.     Baseline  8/10 back pain at worst for the past 2 months (possible greater than 8/10 at worst based on pt reports but pt seems to have difficulty with the pain scale; 04/30/2018); 5-6/10 mid to low back pain at most for the past month (08/08/2018)     Time  6    Period  Weeks    Status  On-going    Target Date  09/19/18      PT LONG TERM GOAL #2   Title  Pt will have a decrease in L lateral leg pain to 5/10 or less at worst  to promote ability to ambulate more comfortably and tolerate standing longer.     Baseline  10/10 at worst for the past 2 months (04/30/2018); 7-8/10 L lateral leg pain/tingling at worst for the past month (08/08/2018).     Time  6    Period  Weeks    Status  On-going    Target Date  09/19/18      PT LONG TERM GOAL #3   Title  Patient will have a decrease in L knee joint pain to 5/10 or less at worst  to promote ability to ambulate more comfortably and tolerate standing longer.     Baseline  10/10 L knee pain at worst for the past 2 months (04/30/2018); 8/10 at worst for the past month (08/08/2018)    Time  6    Period  Weeks    Status  On-going    Target Date  09/19/18      PT LONG TERM GOAL #4   Title  Pt  will improve B LE strength by at least 1/2 MMT grade  to promote ability to ambulate more comfortably and tolerate standing longer.     Time  6    Period  Weeks    Status  Partially Met    Target Date  09/19/18      PT LONG TERM GOAL #5   Title  Pt will report being able to tolerate standing to 5 minutes or more to promote ability to stand at church as well as improve ability to perform chores.     Baseline  Pt able to tolerate standing 1 min per subjective reports. (04/30/2018); half a minute per pt (08/08/2018)    Time  6    Period  Weeks    Status  On-going    Target Date  09/19/18            Plan - 09/02/18 1300    Clinical Impression Statement  Decreased L sciatic symptoms with exercises to promote glute med and max strengthening as well as with decreasing L trunk rotation position. Weak trunk muscles and control observed during exercises. Pt also demonstrates weak L LE strength felt during manually resisted exercises. Pt will benefit from continued skilled physical therapy services to decrease pain, improve strength, function, and ability to ambulate.    Personal Factors and Comorbidities  Age;Comorbidity 3+    Comorbidities  Memory deficit, rheumatoid arthritis, dyspnea, arthritis, B TKA, hx of hysterectomy    Examination-Activity Limitations  Squat;Stairs;Stand;Caring for Others;Carry;Transfers    Stability/Clinical Decision Making  Stable/Uncomplicated    Rehab Potential  Fair    Clinical Impairments Affecting Rehab Potential  Fall risk, age, co-morbidities, multiple areas of pain, LE weakness, decreased lumbar, pelvic, and femoral control    PT Frequency  2x / week    PT Duration  6 weeks    PT Treatment/Interventions  Aquatic Therapy;Cryotherapy;Moist Heat;Gait training;Stair training;Functional mobility training;Therapeutic activities;Therapeutic exercise;Patient/family education;Neuromuscular re-education;Balance training;Manual techniques;Taping;Energy conservation;Passive  range of motion;Electrical Stimulation;Iontophoresis 71m/ml Dexamethasone;Dry needling    Consulted and Agree with Plan of Care  Patient;Family member/caregiver    Family Member Consulted  husband       Patient will benefit from skilled therapeutic intervention in order to improve the following deficits and impairments:  Abnormal gait, Pain, Postural dysfunction, Decreased strength, Difficulty walking, Improper body mechanics, Decreased balance  Visit Diagnosis: 1. Pain in thoracic spine   2. Radiculopathy, lumbosacral region   3. Muscle weakness (generalized)   4. Difficulty in walking, not elsewhere classified   5. Unsteadiness on feet   6. Chronic bilateral low back pain, unspecified whether sciatica present   7. Chronic pain of left knee        Problem List Patient Active Problem List   Diagnosis Date Noted  . Protein-calorie malnutrition ([place] 08/27/2017  . 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 Cubital canal compression syndrome, right 04/18/2017  . Primary osteoarthritis of knee 03/25/2017  . 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 Pure hypercholesterolemia 08/17/2016  . MDS (myelodysplastic syndrome) ([place] 05/12/2016  . B12 deficiency 05/04/2016  . Essential hypertension 05/04/2016  . Mixed stress and urge urinary incontinence 05/04/2016  . Rheumatoid arthritis involving multiple sites with positive rheumatoid factor ([place] 05/04/2016   MJoneen BoersPT, DPT    09/02/2018, 1:59 PM  [facility] [address] [place] [address]Phone: [phone]  Fax:  [phone] Name: JAlpa SalvoMRN: [id number] of Birth: [birth year]

## 2018-09-10 ENCOUNTER — Ambulatory Visit: Payer: Medicare Other | Attending: Internal Medicine

## 2018-09-10 ENCOUNTER — Other Ambulatory Visit: Payer: Self-pay

## 2018-09-10 DIAGNOSIS — M546 Pain in thoracic spine: Secondary | ICD-10-CM | POA: Diagnosis present

## 2018-09-10 DIAGNOSIS — R2681 Unsteadiness on feet: Secondary | ICD-10-CM | POA: Diagnosis present

## 2018-09-10 DIAGNOSIS — M25562 Pain in left knee: Secondary | ICD-10-CM | POA: Diagnosis present

## 2018-09-10 DIAGNOSIS — M545 Low back pain: Secondary | ICD-10-CM | POA: Diagnosis present

## 2018-09-10 DIAGNOSIS — M5417 Radiculopathy, lumbosacral region: Secondary | ICD-10-CM | POA: Diagnosis present

## 2018-09-10 DIAGNOSIS — G8929 Other chronic pain: Secondary | ICD-10-CM | POA: Diagnosis present

## 2018-09-10 DIAGNOSIS — R262 Difficulty in walking, not elsewhere classified: Secondary | ICD-10-CM | POA: Insufficient documentation

## 2018-09-10 DIAGNOSIS — M6281 Muscle weakness (generalized): Secondary | ICD-10-CM | POA: Diagnosis present

## 2018-09-10 NOTE — Therapy (Signed)
[place] [address] [place], [address] Phone: [phone]   Fax:  [phone]  Physical Therapy Treatment  Patient Details  Name: Sylvia Miller MRN: [id number] Date of Birth: 12/04/1940 Referring Provider (PT): Marlowe Sax, MD   Encounter Date: 09/10/2018  PT End of Session - 09/10/18 1305    Visit Number  14    Number of Visits  25    Date for PT Re-Evaluation  09/19/18    Authorization Type  4    Authorization Time Period  of 10 progress report (from 04/30/2018)    PT Start Time  1306    PT Stop Time  1347    PT Time Calculation (min)  41 min    Activity Tolerance  Patient tolerated treatment well    Behavior During Therapy  [facility] for tasks assessed/performed       Past Medical History:  Diagnosis Date  . Allergy   . Anemia   . Arthritis    rheumatoid /knees/hands/shoulders (Right shoulder) infusion every 5 weeks  . Bladder incontinence   . Chicken pox   . Dyspnea    with exertion or far walking  . Gastric ulcer   . GERD (gastroesophageal reflux disease)   . Heart murmur   . Hypercholesteremia   . Hyperlipidemia, unspecified   . Hypertension    controlled with meds;   Marland Kitchen MDS (myelodysplastic syndrome) ([place]) 2000  . Memory deficit   . Muscular dystrophy ([place])    per patient, this is incorrect  . Myelodysplastic syndrome ([place])   . Myelodysplastic syndrome ([place])   . Rheumatoid arthritis ([place])   . Ulcer     Past Surgical History:  Procedure Laterality Date  . ABDOMINAL HYSTERECTOMY    . APPENDECTOMY     as a child  . CHOLECYSTECTOMY    . COLONOSCOPY    . COLONOSCOPY WITH PROPOFOL N/A 04/24/2017   Procedure: COLONOSCOPY WITH PROPOFOL;  Surgeon: Lollie Sails, MD;  Location: [facility] ENDOSCOPY;  Service: Endoscopy;  Laterality: N/A;  . ESOPHAGOGASTRODUODENOSCOPY (EGD) WITH PROPOFOL N/A 04/24/2017   Procedure: ESOPHAGOGASTRODUODENOSCOPY (EGD) WITH PROPOFOL;  Surgeon:  Lollie Sails, MD;  Location: [facility] ENDOSCOPY;  Service: Endoscopy;  Laterality: N/A;  . JOINT REPLACEMENT    . KNEE ARTHROPLASTY Right 02/21/2017   Procedure: COMPUTER ASSISTED TOTAL KNEE ARTHROPLASTY;  Surgeon: Dereck Leep, MD;  Location: [facility] ORS;  Service: Orthopedics;  Laterality: Right;  . KNEE ARTHROPLASTY Left 08/06/2017   Procedure: COMPUTER ASSISTED TOTAL KNEE ARTHROPLASTY;  Surgeon: Dereck Leep, MD;  Location: [facility] ORS;  Service: Orthopedics;  Laterality: Left;  . NM INTRAVENOUS INJECTION ([facility] HX)     receives biologic infusions every 5 weeks for rheumatoid arthritis. followed by dr. Meda Coffee at [facility]  . TONSILLECTOMY    . UPPER GASTROINTESTINAL ENDOSCOPY    . WISDOM TOOTH EXTRACTION      There were no vitals filed for this visit.  Subjective Assessment - 09/10/18 1308    Subjective  Back seems to be ok. 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 L knee feels tingly (7-8/10 currently). Did not notice L lateral leg bothering her.    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 Pt had L TKA 08/05/2017. Last 3-4 months pt has been having pain L lateral leg. Pt also states feeling pain in her upper back  including her B shoulders. Mid back pain travels down her spine  to her low back and feel radiating pain down her L lateral leg. R LE seems fine.   Denies loss of bowel or bladder control, or saddle anesthesia.  No falls within the last 6 months.  Low back and L lateral leg pain seems to have increased since onset.    Difficulty with history. Husband assist   Patient Stated Goals  Walk more comfortably. Stand longer than a minute at church.    Currently in Pain?  Yes    Pain Score  8    7-8/10 L knee pain   Pain Onset  More than a month ago                               PT Education - 09/10/18 1331    Education provided  Yes    Education Details  ther-ex    [employer]) Educated  Patient    Methods   Explanation;Demonstration;Tactile cues;Verbal cues    Comprehension  Returned demonstration;Verbalized understanding      Objective    MedbridgeAccess Code:Access Code: [email or web address]   Does not think she has latex allergies  Sitting posture: L trunk rotation    Manual therapy   Seated STM L lateral hamstrings Seated STM L vastus lateralis Seated STM L iliotibial band       Therapeutic exercises  SpO2 L hand (pt seems out of breath). Pt husband said a couple of weeks ago that they tested negative for COVID  98% room air  Pt states that the hot weather makes her tired. Drove to PT with no AC because there was not enough gas in the car  Seated L knee flexion manually resisted targeting the medial hamstrings 10x3 with 5 second holds    Seated clamshell isometrics (hips less than 90 degrees flexion) 10x5 seconds for 3 sets   seated hip extension manually resisted   L 10x2  Pt seems more out of breath than usual Blood pressure L arm sitting, mechanically taken, normal cuff 118/94, HR 82, 97% room air  SLS L with R tip toe and B UE assist, emphasis on level pelvis 5x5 seconds   Therapeutic rest breaks secondary to pt fatigue  Husband informed of her fatigue (with pt verbal permission). Husband said that she usually gets tired due to hot weather.    Improved exercise technique, movement at target joints, use of target muscles after mod verbal, visual, tactile cues.      Response to treatment Good muscle use felt with exercises.No complain of increased pain.     Clinical impression Back and L lateral leg pain seem to be improving based on pt mainly stating L knee bothering her during the subjective report. Worked on decreasing L lateral hamstrings tension, improving medial hamstrings, glute med and max strength as well as decreasing tension to L vastus lateralis and iliotibial band to promote better mechanics at her knee and help decrease pain. Pt  will benefit from continued skilled physical therapy services to decrease pain, improve strength, function, and decrease difficulty ambulating.      PT Short Term Goals - 08/08/18 1826      PT SHORT TERM GOAL #1   Title  Patient will be independent with her HEP to improve strength, decrease pain, improve function    Time  3    Period  Weeks    Status  On-going    Target Date  05/23/18  PT Long Term Goals - 08/08/18 1506      PT LONG TERM GOAL #1   Title  Patient will have a decrease in mid to low back pain to 5/10 or less at worst to promote ability to ambulate more comfortably and tolerate standing longer.     Baseline  8/10 back pain at worst for the past 2 months (possible greater than 8/10 at worst based on pt reports but pt seems to have difficulty with the pain scale; 04/30/2018); 5-6/10 mid to low back pain at most for the past month (08/08/2018)     Time  6    Period  Weeks    Status  On-going    Target Date  09/19/18      PT LONG TERM GOAL #2   Title  Pt will have a decrease in L lateral leg pain to 5/10 or less at worst  to promote ability to ambulate more comfortably and tolerate standing longer.     Baseline  10/10 at worst for the past 2 months (04/30/2018); 7-8/10 L lateral leg pain/tingling at worst for the past month (08/08/2018).     Time  6    Period  Weeks    Status  On-going    Target Date  09/19/18      PT LONG TERM GOAL #3   Title  Patient will have a decrease in L knee joint pain to 5/10 or less at worst  to promote ability to ambulate more comfortably and tolerate standing longer.     Baseline  10/10 L knee pain at worst for the past 2 months (04/30/2018); 8/10 at worst for the past month (08/08/2018)    Time  6    Period  Weeks    Status  On-going    Target Date  09/19/18      PT LONG TERM GOAL #4   Title  Pt will improve B LE strength by at least 1/2 MMT grade  to promote ability to ambulate more comfortably and tolerate standing longer.     Time  6     Period  Weeks    Status  Partially Met    Target Date  09/19/18      PT LONG TERM GOAL #5   Title  Pt will report being able to tolerate standing to 5 minutes or more to promote ability to stand at church as well as improve ability to perform chores.     Baseline  Pt able to tolerate standing 1 min per subjective reports. (04/30/2018); half a minute per pt (08/08/2018)    Time  6    Period  Weeks    Status  On-going    Target Date  09/19/18            Plan - 09/10/18 1332    Clinical Impression Statement  Back and L lateral leg pain seem to be improving based on pt mainly stating L knee bothering her during the subjective report. Worked on decreasing L lateral hamstrings tension, improving medial hamstrings, glute med and max strength as well as decreasing tension to L vastus lateralis and iliotibial band to promote better mechanics at her knee and help decrease pain. Pt will benefit from continued skilled physical therapy services to decrease pain, improve strength, function, and decrease difficulty ambulating.    Personal Factors and Comorbidities  Age;Comorbidity 3+    Comorbidities  Memory deficit, rheumatoid arthritis, dyspnea, arthritis, B TKA, hx of hysterectomy  Examination-Activity Limitations  Squat;Stairs;Stand;Caring for Others;Carry;Transfers    Stability/Clinical Decision Making  Stable/Uncomplicated    Rehab Potential  Fair    Clinical Impairments Affecting Rehab Potential  Fall risk, age, co-morbidities, multiple areas of pain, LE weakness, decreased lumbar, pelvic, and femoral control    PT Frequency  2x / week    PT Duration  6 weeks    PT Treatment/Interventions  Aquatic Therapy;Cryotherapy;Moist Heat;Gait training;Stair training;Functional mobility training;Therapeutic activities;Therapeutic exercise;Patient/family education;Neuromuscular re-education;Balance training;Manual techniques;Taping;Energy conservation;Passive range of motion;Electrical  Stimulation;Iontophoresis 43m/ml Dexamethasone;Dry needling    Consulted and Agree with Plan of Care  Patient;Family member/caregiver    Family Member Consulted  husband       Patient will benefit from skilled therapeutic intervention in order to improve the following deficits and impairments:  Abnormal gait, Pain, Postural dysfunction, Decreased strength, Difficulty walking, Improper body mechanics, Decreased balance  Visit Diagnosis: 1. Pain in thoracic spine   2. Radiculopathy, lumbosacral region   3. Muscle weakness (generalized)   4. Difficulty in walking, not elsewhere classified   5. Chronic bilateral low back pain, unspecified whether sciatica present   6. Unsteadiness on feet   7. Chronic pain of left knee        Problem List Patient Active Problem List   Diagnosis Date Noted  . Protein-calorie malnutrition ([place] 08/27/2017  . 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 Cubital canal compression syndrome, right 04/18/2017  . Primary osteoarthritis of knee 03/25/2017  . 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 Pure hypercholesterolemia 08/17/2016  . MDS (myelodysplastic syndrome) ([place] 05/12/2016  . B12 deficiency 05/04/2016  . Essential hypertension 05/04/2016  . Mixed stress and urge urinary incontinence 05/04/2016  . Rheumatoid arthritis involving multiple sites with positive rheumatoid factor ([place] 05/04/2016    MJoneen BoersPT, DPT   09/10/2018, 4:33 PM  [facility] [address] [place] [address]Phone: [phone]  Fax:  [phone] Name: Sylvia BrotzMRN: [id number] of Birth: [birth year]

## 2018-09-12 ENCOUNTER — Other Ambulatory Visit: Payer: Self-pay

## 2018-09-12 ENCOUNTER — Ambulatory Visit: Payer: Medicare Other

## 2018-09-12 DIAGNOSIS — M6281 Muscle weakness (generalized): Secondary | ICD-10-CM

## 2018-09-12 DIAGNOSIS — G8929 Other chronic pain: Secondary | ICD-10-CM

## 2018-09-12 DIAGNOSIS — M546 Pain in thoracic spine: Secondary | ICD-10-CM | POA: Diagnosis not present

## 2018-09-12 DIAGNOSIS — M545 Low back pain, unspecified: Secondary | ICD-10-CM

## 2018-09-12 DIAGNOSIS — R2681 Unsteadiness on feet: Secondary | ICD-10-CM

## 2018-09-12 DIAGNOSIS — M5417 Radiculopathy, lumbosacral region: Secondary | ICD-10-CM

## 2018-09-12 DIAGNOSIS — R262 Difficulty in walking, not elsewhere classified: Secondary | ICD-10-CM

## 2018-09-12 NOTE — Therapy (Signed)
[place] [address], [place], [address] Phone: [phone]   Fax:  [phone]  Physical Therapy Treatment  Patient Details  Name: Sylvia Miller MRN: [id number] Date of Birth: 06-23-1940 Referring Provider (PT): Marlowe Sax, MD   Encounter Date: 09/12/2018  PT End of Session - 09/12/18 1323    Visit Number  15    Number of Visits  25    Date for PT Re-Evaluation  09/19/18    Authorization Type  5    Authorization Time Period  of 10 progress report (from 04/30/2018)    PT Start Time  1314    PT Stop Time  1400    PT Time Calculation (min)  46 min    Activity Tolerance  Patient tolerated treatment well    Behavior During Therapy  [facility] for tasks assessed/performed       Past Medical History:  Diagnosis Date  . Allergy   . Anemia   . Arthritis    rheumatoid /knees/hands/shoulders (Right shoulder) infusion every 5 weeks  . Bladder incontinence   . Chicken pox   . Dyspnea    with exertion or far walking  . Gastric ulcer   . GERD (gastroesophageal reflux disease)   . Heart murmur   . Hypercholesteremia   . Hyperlipidemia, unspecified   . Hypertension    controlled with meds;   Marland Kitchen MDS (myelodysplastic syndrome) ([place]) 2000  . Memory deficit   . Muscular dystrophy ([place])    per patient, this is incorrect  . Myelodysplastic syndrome ([place])   . Myelodysplastic syndrome ([place])   . Rheumatoid arthritis ([place])   . Ulcer     Past Surgical History:  Procedure Laterality Date  . ABDOMINAL HYSTERECTOMY    . APPENDECTOMY     as a child  . CHOLECYSTECTOMY    . COLONOSCOPY    . COLONOSCOPY WITH PROPOFOL N/A 04/24/2017   Procedure: COLONOSCOPY WITH PROPOFOL;  Surgeon: Lollie Sails, MD;  Location[facility] ENDOSCOPY;  Service: Endoscopy;  Laterality: N/A;  . ESOPHAGOGASTRODUODENOSCOPY (EGD) WITH PROPOFOL N/A 04/24/2017   Procedure: ESOPHAGOGASTRODUODENOSCOPY (EGD) WITH PROPOFOL;  Surgeon:  Lollie Sails, MD;  Location: [facility] ENDOSCOPY;  Service: Endoscopy;  Laterality: N/A;  . JOINT REPLACEMENT    . KNEE ARTHROPLASTY Right 02/21/2017   Procedure: COMPUTER ASSISTED TOTAL KNEE ARTHROPLASTY;  Surgeon: Dereck Leep, MD;  Location: [facility] ORS;  Service: Orthopedics;  Laterality: Right;  . KNEE ARTHROPLASTY Left 08/06/2017   Procedure: COMPUTER ASSISTED TOTAL KNEE ARTHROPLASTY;  Surgeon: Dereck Leep, MD;  Location: [facility] ORS;  Service: Orthopedics;  Laterality: Left;  . NM INTRAVENOUS INJECTION ([facility] HX)     receives biologic infusions every 5 weeks for rheumatoid arthritis. followed by dr. Meda Coffee at [place] [facility]  . TONSILLECTOMY    . UPPER GASTROINTESTINAL ENDOSCOPY    . WISDOM TOOTH EXTRACTION      There were no vitals filed for this visit.  Subjective Assessment - 09/12/18 1316    Subjective  L knee is tingling and sore.  Back does not seem to be a problem. Leg feels tingly all the way down. 6-7/10 L knee pain with gait.    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 Pt had L TKA 08/05/2017. Last 3-4 months pt has been having pain L lateral leg. Pt also states feeling pain in her upper back  including her B shoulders. Mid back pain travels down her spine  to her low back and feel radiating pain down her L lateral leg. R LE seems fine.   Denies loss of bowel or bladder control, or saddle anesthesia.  No falls within the last 6 months.  Low back and L lateral leg pain seems to have increased since onset.    Difficulty with history. Husband assist   Patient Stated Goals  Walk more comfortably. Stand longer than a minute at church.    Currently in Pain?  Yes    Pain Score  7    L knee   Pain Onset  More than a month ago                               PT Education - 09/12/18 1322    Education provided  Yes    Education Details  ther-ex    [employer]) Educated  Patient    Methods  Explanation;Demonstration;Tactile cues;Verbal  cues    Comprehension  Returned demonstration;Verbalized understanding      Objective    MedbridgeAccess Code:Access Code: [email or web address]   Does not think she has latex allergies  Sitting posture: L trunk rotation    Therapeutic exercises  Standing glute max squeeze with B UE assist 10x5 seconds for 3 sets  Stand to sit onto elevated mat table height 19 inches without UE assist 3x  Seated clamshell isometrics (hips less than 90 degrees flexion) 10x5 seconds for 3 sets manually resisted  Standing L hip abduction with B UE assist 10x2  Seated L knee flexion isometrics manually resisted targeting the medial hamstrings 10x3 with 5 second holds   Seated manually resisted L knee extension 10x3    seated hip extension manually resisted              L 10x2  No L knee pain after aforementioned exercises     Improved exercise technique, movement at target joints, use of target muscles after mod verbal, visual, tactile cues.      Manual therapy  Seated STM L lateral hamstrings Seated STM L vastus lateralis Seated STM L iliotibial band    Decreased L leg tingling after manual therapy   Response to treatment Good muscle use felt with exercises.No complain of increased pain.     Clinical impression Continued working on L glute med, max, medial hamstrings, quadriceps strengthening to promote better mechanics at her L knee with standing tasks as well as worked on decreasing muscle tension to L lateral knee tissues to decrease tension on L patella. Decreased L knee pain and L leg tingling after session. Pt will benefit from continued skilled physical therapy services to decrease pain, improve strength, femoral control, function, and ability to ambulate.     PT Short Term Goals - 08/08/18 1826      PT SHORT TERM GOAL #1   Title  Patient will be independent with her HEP to improve strength, decrease pain, improve function    Time  3    Period  Weeks     Status  On-going    Target Date  05/23/18        PT Long Term Goals - 08/08/18 1506      PT LONG TERM GOAL #1   Title  Patient will have a decrease in mid to low back pain to 5/10 or less at worst to promote ability to ambulate more comfortably and tolerate standing longer.     Baseline  8/10 back  pain at worst for the past 2 months (possible greater than 8/10 at worst based on pt reports but pt seems to have difficulty with the pain scale; 04/30/2018); 5-6/10 mid to low back pain at most for the past month (08/08/2018)     Time  6    Period  Weeks    Status  On-going    Target Date  09/19/18      PT LONG TERM GOAL #2   Title  Pt will have a decrease in L lateral leg pain to 5/10 or less at worst  to promote ability to ambulate more comfortably and tolerate standing longer.     Baseline  10/10 at worst for the past 2 months (04/30/2018); 7-8/10 L lateral leg pain/tingling at worst for the past month (08/08/2018).     Time  6    Period  Weeks    Status  On-going    Target Date  09/19/18      PT LONG TERM GOAL #3   Title  Patient will have a decrease in L knee joint pain to 5/10 or less at worst  to promote ability to ambulate more comfortably and tolerate standing longer.     Baseline  10/10 L knee pain at worst for the past 2 months (04/30/2018); 8/10 at worst for the past month (08/08/2018)    Time  6    Period  Weeks    Status  On-going    Target Date  09/19/18      PT LONG TERM GOAL #4   Title  Pt will improve B LE strength by at least 1/2 MMT grade  to promote ability to ambulate more comfortably and tolerate standing longer.     Time  6    Period  Weeks    Status  Partially Met    Target Date  09/19/18      PT LONG TERM GOAL #5   Title  Pt will report being able to tolerate standing to 5 minutes or more to promote ability to stand at church as well as improve ability to perform chores.     Baseline  Pt able to tolerate standing 1 min per subjective reports. (04/30/2018); half a  minute per pt (08/08/2018)    Time  6    Period  Weeks    Status  On-going    Target Date  09/19/18            Plan - 09/12/18 1311    Clinical Impression Statement  Continued working on L glute med, max, medial hamstrings, quadriceps strengthening to promote better mechanics at her L knee with standing tasks as well as worked on decreasing muscle tension to L lateral knee tissues to decrease tension on L patella. Decreased L knee pain and L leg tingling after session. Pt will benefit from continued skilled physical therapy services to decrease pain, improve strength, femoral control, function, and ability to ambulate.    Personal Factors and Comorbidities  Age;Comorbidity 3+    Comorbidities  Memory deficit, rheumatoid arthritis, dyspnea, arthritis, B TKA, hx of hysterectomy    Examination-Activity Limitations  Squat;Stairs;Stand;Caring for Others;Carry;Transfers    Stability/Clinical Decision Making  Stable/Uncomplicated    Rehab Potential  Fair    Clinical Impairments Affecting Rehab Potential  Fall risk, age, co-morbidities, multiple areas of pain, LE weakness, decreased lumbar, pelvic, and femoral control    PT Frequency  2x / week    PT Duration  6 weeks  PT Treatment/Interventions  Aquatic Therapy;Cryotherapy;Moist Heat;Gait training;Stair training;Functional mobility training;Therapeutic activities;Therapeutic exercise;Patient/family education;Neuromuscular re-education;Balance training;Manual techniques;Taping;Energy conservation;Passive range of motion;Electrical Stimulation;Iontophoresis 49m/ml Dexamethasone;Dry needling    Consulted and Agree with Plan of Care  Patient    Family Member Consulted  --       Patient will benefit from skilled therapeutic intervention in order to improve the following deficits and impairments:  Abnormal gait, Pain, Postural dysfunction, Decreased strength, Difficulty walking, Improper body mechanics, Decreased balance  Visit Diagnosis: 1.  Radiculopathy, lumbosacral region   2. Pain in thoracic spine   3. Muscle weakness (generalized)   4. Difficulty in walking, not elsewhere classified   5. Chronic bilateral low back pain, unspecified whether sciatica present   6. Unsteadiness on feet   7. Chronic pain of left knee        Problem List Patient Active Problem List   Diagnosis Date Noted  . Protein-calorie malnutrition ([place] 08/27/2017  . 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 Cubital canal compression syndrome, right 04/18/2017  . Primary osteoarthritis of knee 03/25/2017  . 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 Pure hypercholesterolemia 08/17/2016  . MDS (myelodysplastic syndrome) ([place] 05/12/2016  . B12 deficiency 05/04/2016  . Essential hypertension 05/04/2016  . Mixed stress and urge urinary incontinence 05/04/2016  . Rheumatoid arthritis involving multiple sites with positive rheumatoid factor ([place] 05/04/2016    MJoneen BoersPT, DPT   09/12/2018, 2:15 PM  [place] [facility] [address] [place] [address]Phone: [phone]  Fax:  [phone] Name: Sylvia MicheliniMRN: [id number] of Birth: 310-06-1940

## 2018-09-16 ENCOUNTER — Other Ambulatory Visit: Payer: Self-pay

## 2018-09-16 ENCOUNTER — Ambulatory Visit: Payer: Medicare Other

## 2018-09-16 DIAGNOSIS — R262 Difficulty in walking, not elsewhere classified: Secondary | ICD-10-CM

## 2018-09-16 DIAGNOSIS — M546 Pain in thoracic spine: Secondary | ICD-10-CM | POA: Diagnosis not present

## 2018-09-16 DIAGNOSIS — G8929 Other chronic pain: Secondary | ICD-10-CM

## 2018-09-16 DIAGNOSIS — M5417 Radiculopathy, lumbosacral region: Secondary | ICD-10-CM

## 2018-09-16 DIAGNOSIS — M25562 Pain in left knee: Secondary | ICD-10-CM

## 2018-09-16 DIAGNOSIS — R2681 Unsteadiness on feet: Secondary | ICD-10-CM

## 2018-09-16 DIAGNOSIS — M6281 Muscle weakness (generalized): Secondary | ICD-10-CM

## 2018-09-16 NOTE — Therapy (Signed)
[place] [facility] [address], [place], [address] Phone: [phone]   Fax:  [phone]  Physical Therapy Treatment  Patient Details  Name: Sylvia Miller MRN: [id number] Date of Birth: 03-Feb-1941 Referring Provider (PT): Marlowe Sax, MD   Encounter Date: 09/16/2018  PT End of Session - 09/16/18 1651    Visit Number  16    Number of Visits  25    Date for PT Re-Evaluation  09/19/18    Authorization Type  6    Authorization Time Period  of 10 progress report (from 04/30/2018)    PT Start Time  1640    PT Stop Time  1720    PT Time Calculation (min)  40 min    Activity Tolerance  Patient tolerated treatment well;No increased pain;Patient limited by fatigue    Behavior During Therapy  [facility] for tasks assessed/performed       Past Medical History:  Diagnosis Date  . Allergy   . Anemia   . Arthritis    rheumatoid /knees/hands/shoulders (Right shoulder) infusion every 5 weeks  . Bladder incontinence   . Chicken pox   . Dyspnea    with exertion or far walking  . Gastric ulcer   . GERD (gastroesophageal reflux disease)   . Heart murmur   . Hypercholesteremia   . Hyperlipidemia, unspecified   . Hypertension    controlled with meds;   Marland Kitchen MDS (myelodysplastic syndrome) ([place]) 2000  . Memory deficit   . Muscular dystrophy ([place])    per patient, this is incorrect  . Myelodysplastic syndrome ([place])   . Myelodysplastic syndrome ([place])   . Rheumatoid arthritis ([place])   . Ulcer     Past Surgical History:  Procedure Laterality Date  . ABDOMINAL HYSTERECTOMY    . APPENDECTOMY     as a child  . CHOLECYSTECTOMY    . COLONOSCOPY    . COLONOSCOPY WITH PROPOFOL N/A 04/24/2017   Procedure: COLONOSCOPY WITH PROPOFOL;  Surgeon: Lollie Sails, MD;  Location: [facility] ENDOSCOPY;  Service: Endoscopy;  Laterality: N/A;  . ESOPHAGOGASTRODUODENOSCOPY (EGD) WITH PROPOFOL N/A 04/24/2017   Procedure:  ESOPHAGOGASTRODUODENOSCOPY (EGD) WITH PROPOFOL;  Surgeon: Lollie Sails, MD;  Location: [facility] ENDOSCOPY;  Service: Endoscopy;  Laterality: N/A;  . JOINT REPLACEMENT    . KNEE ARTHROPLASTY Right 02/21/2017   Procedure: COMPUTER ASSISTED TOTAL KNEE ARTHROPLASTY;  Surgeon: Dereck Leep, MD;  Location: [facility] ORS;  Service: Orthopedics;  Laterality: Right;  . KNEE ARTHROPLASTY Left 08/06/2017   Procedure: COMPUTER ASSISTED TOTAL KNEE ARTHROPLASTY;  Surgeon: Dereck Leep, MD;  Location: [facility] ORS;  Service: Orthopedics;  Laterality: Left;  . NM INTRAVENOUS INJECTION ([facility] HX)     receives biologic infusions every 5 weeks for rheumatoid arthritis. followed by dr. Meda Coffee at [facility]  . TONSILLECTOMY    . UPPER GASTROINTESTINAL ENDOSCOPY    . WISDOM TOOTH EXTRACTION      There were no vitals filed for this visit.  Subjective Assessment - 09/16/18 1643    Subjective  Pt reports pain in the back of he rleg as per usual, no updates to meds or to exercises recently.    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 Pt had L TKA 08/05/2017. Last 3-4 months pt has been having pain L lateral leg. Pt also states feeling pain in her upper back  including her B shoulders. Mid back pain travels down her spine to her low  back and feel radiating pain down her L lateral leg. R LE seems fine.   Denies loss of bowel or bladder control, or saddle anesthesia.  No falls within the last 6 months.  Low back and L lateral leg pain seems to have increased since onset.     Currently in Pain?  Yes    Pain Score  7     Pain Location  --   Left calf      INTERVENTION THIS DATE  -Stand to sit onto elevated mat table height 19 inches without UE assist 2x5, supervision level with chair in front for safety -Hooklying RedTB clam with green ball between feet 2x15 -Hooklying Green Ball Adduction squeeze 2x15x3secH  -Glute max bridge (maximal hip/knee flexion in wide stance) 2x15 -Hook-lying Bent  knee raise (marching) 2x10 bilat (feels tender on left hip, but abated with multimodal cues for slight abduction of knee (knee toward shoulder)  -Hooklying SLR 2x10 bilat (MinA provided on Left side for pain control)  -Left SAQ (supported by green ball) 2x10 c 2.5lb ankle weight  - gluteal set (pillow under ankle c cues to crush) 2x10x3secH bilat     PT Short Term Goals - 08/08/18 1826      PT SHORT TERM GOAL #1   Title  Patient will be independent with her HEP to improve strength, decrease pain, improve function    Time  3    Period  Weeks    Status  On-going    Target Date  05/23/18        PT Long Term Goals - 08/08/18 1506      PT LONG TERM GOAL #1   Title  Patient will have a decrease in mid to low back pain to 5/10 or less at worst to promote ability to ambulate more comfortably and tolerate standing longer.     Baseline  8/10 back pain at worst for the past 2 months (possible greater than 8/10 at worst based on pt reports but pt seems to have difficulty with the pain scale; 04/30/2018); 5-6/10 mid to low back pain at most for the past month (08/08/2018)     Time  6    Period  Weeks    Status  On-going    Target Date  09/19/18      PT LONG TERM GOAL #2   Title  Pt will have a decrease in L lateral leg pain to 5/10 or less at worst  to promote ability to ambulate more comfortably and tolerate standing longer.     Baseline  10/10 at worst for the past 2 months (04/30/2018); 7-8/10 L lateral leg pain/tingling at worst for the past month (08/08/2018).     Time  6    Period  Weeks    Status  On-going    Target Date  09/19/18      PT LONG TERM GOAL #3   Title  Patient will have a decrease in L knee joint pain to 5/10 or less at worst  to promote ability to ambulate more comfortably and tolerate standing longer.     Baseline  10/10 L knee pain at worst for the past 2 months (04/30/2018); 8/10 at worst for the past month (08/08/2018)    Time  6    Period  Weeks    Status  On-going     Target Date  09/19/18      PT LONG TERM GOAL #4   Title  Pt will improve B  LE strength by at least 1/2 MMT grade  to promote ability to ambulate more comfortably and tolerate standing longer.     Time  6    Period  Weeks    Status  Partially Met    Target Date  09/19/18      PT LONG TERM GOAL #5   Title  Pt will report being able to tolerate standing to 5 minutes or more to promote ability to stand at church as well as improve ability to perform chores.     Baseline  Pt able to tolerate standing 1 min per subjective reports. (04/30/2018); half a minute per pt (08/08/2018)    Time  6    Period  Weeks    Status  On-going    Target Date  09/19/18            Plan - 09/16/18 1654    Clinical Impression Statement  Continued with current plan, focus on hip and core strengthening, gently to avoid exacerbation of symptoms or pain. Pt is easily fatigue and sometimes tachypnic, but tolerates session wel when adequate rest breaks are given.    Comorbidities  Memory deficit, rheumatoid arthritis, dyspnea, arthritis, B TKA, hx of hysterectomy    Rehab Potential  Fair    PT Frequency  2x / week    PT Duration  6 weeks    PT Treatment/Interventions  Aquatic Therapy;Cryotherapy;Moist Heat;Gait training;Stair training;Functional mobility training;Therapeutic activities;Therapeutic exercise;Patient/family education;Neuromuscular re-education;Balance training;Manual techniques;Taping;Energy conservation;Passive range of motion;Electrical Stimulation;Iontophoresis 75m/ml Dexamethasone;Dry needling    PT Next Visit Plan  Continued with core strengthening, hip strenghtening.    PT Home Exercise Plan  no unpdates this session    Consulted and Agree with Plan of Care  Patient       Patient will benefit from skilled therapeutic intervention in order to improve the following deficits and impairments:  Abnormal gait, Pain, Postural dysfunction, Decreased strength, Difficulty walking, Improper body mechanics,  Decreased balance  Visit Diagnosis: 1. Radiculopathy, lumbosacral region   2. Pain in thoracic spine   3. Muscle weakness (generalized)   4. Difficulty in walking, not elsewhere classified   5. Chronic bilateral low back pain, unspecified whether sciatica present   6. Unsteadiness on feet   7. Chronic pain of left knee        Problem List Patient Active Problem List   Diagnosis Date Noted  . Protein-calorie malnutrition ([place] 08/27/2017  . 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 Cubital canal compression syndrome, right 04/18/2017  . Primary osteoarthritis of knee 03/25/2017  . 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 Pure hypercholesterolemia 08/17/2016  . MDS (myelodysplastic syndrome) ([place] 05/12/2016  . B12 deficiency 05/04/2016  . Essential hypertension 05/04/2016  . Mixed stress and urge urinary incontinence 05/04/2016  . Rheumatoid arthritis involving multiple sites with positive rheumatoid factor ([place] 05/04/2016    5:19 PM, 09/16/18 AEtta Grandchild PT, DPT Physical Therapist - [phone](Office)    , C 09/16/2018, 5:18 PM  [facility] [address] [place] [address]Phone: [phone]  Fax:  [phone] Name: Sylvia BlunckMRN: [id number] of Birth: [birth year]

## 2018-09-18 ENCOUNTER — Ambulatory Visit: Payer: Medicare Other

## 2018-09-23 ENCOUNTER — Other Ambulatory Visit: Payer: Medicare Other

## 2018-09-23 ENCOUNTER — Ambulatory Visit: Payer: Medicare Other | Admitting: Oncology

## 2018-09-24 ENCOUNTER — Ambulatory Visit: Payer: Medicare Other

## 2018-09-24 ENCOUNTER — Other Ambulatory Visit: Payer: Self-pay

## 2018-09-24 DIAGNOSIS — R2681 Unsteadiness on feet: Secondary | ICD-10-CM

## 2018-09-24 DIAGNOSIS — R262 Difficulty in walking, not elsewhere classified: Secondary | ICD-10-CM

## 2018-09-24 DIAGNOSIS — G8929 Other chronic pain: Secondary | ICD-10-CM

## 2018-09-24 DIAGNOSIS — M546 Pain in thoracic spine: Secondary | ICD-10-CM | POA: Diagnosis not present

## 2018-09-24 DIAGNOSIS — M6281 Muscle weakness (generalized): Secondary | ICD-10-CM

## 2018-09-24 DIAGNOSIS — M25562 Pain in left knee: Secondary | ICD-10-CM

## 2018-09-24 DIAGNOSIS — M5417 Radiculopathy, lumbosacral region: Secondary | ICD-10-CM

## 2018-09-24 NOTE — Therapy (Signed)
[place] [facility] [address] [place], [address] Phone: [phone]   Fax:  [phone]  Physical Therapy Treatment  Patient Details  Name: Sylvia Miller MRN: [id number] Date of Birth: 1941-02-11 Referring Provider (PT): Marlowe Sax, MD   Encounter Date: 09/24/2018  PT End of Session - 09/24/18 1453    Visit Number  17    Number of Visits  37    Date for PT Re-Evaluation  11/07/18    Authorization Type  7    Authorization Time Period  of 10 progress report (from 04/30/2018)    PT Start Time  1453    PT Stop Time  1533    PT Time Calculation (min)  40 min    Activity Tolerance  Patient tolerated treatment well;No increased pain;Patient limited by fatigue    Behavior During Therapy  [facility] for tasks assessed/performed       Past Medical History:  Diagnosis Date  . Allergy   . Anemia   . Arthritis    rheumatoid /knees/hands/shoulders (Right shoulder) infusion every 5 weeks  . Bladder incontinence   . Chicken pox   . Dyspnea    with exertion or far walking  . Gastric ulcer   . GERD (gastroesophageal reflux disease)   . Heart murmur   . Hypercholesteremia   . Hyperlipidemia, unspecified   . Hypertension    controlled with meds;   Marland Kitchen MDS (myelodysplastic syndrome) ([place]) 2000  . Memory deficit   . Muscular dystrophy ([place])    per patient, this is incorrect  . Myelodysplastic syndrome ([place])   . Myelodysplastic syndrome ([place])   . Rheumatoid arthritis ([place])   . Ulcer     Past Surgical History:  Procedure Laterality Date  . ABDOMINAL HYSTERECTOMY    . APPENDECTOMY     as a child  . CHOLECYSTECTOMY    . COLONOSCOPY    . COLONOSCOPY WITH PROPOFOL N/A 04/24/2017   Procedure: COLONOSCOPY WITH PROPOFOL;  Surgeon: Lollie Sails, MD;  Location: [facility] ENDOSCOPY;  Service: Endoscopy;  Laterality: N/A;  . ESOPHAGOGASTRODUODENOSCOPY (EGD) WITH PROPOFOL N/A 04/24/2017   Procedure:  ESOPHAGOGASTRODUODENOSCOPY (EGD) WITH PROPOFOL;  Surgeon: Lollie Sails, MD;  Location: [facility] ENDOSCOPY;  Service: Endoscopy;  Laterality: N/A;  . JOINT REPLACEMENT    . KNEE ARTHROPLASTY Right 02/21/2017   Procedure: COMPUTER ASSISTED TOTAL KNEE ARTHROPLASTY;  Surgeon: Dereck Leep, MD;  Location: [facility] ORS;  Service: Orthopedics;  Laterality: Right;  . KNEE ARTHROPLASTY Left 08/06/2017   Procedure: COMPUTER ASSISTED TOTAL KNEE ARTHROPLASTY;  Surgeon: Dereck Leep, MD;  Location: [facility] ORS;  Service: Orthopedics;  Laterality: Left;  . NM INTRAVENOUS INJECTION ([facility] HX)     receives biologic infusions every 5 weeks for rheumatoid arthritis. followed by dr. Meda Coffee at [facility]  . TONSILLECTOMY    . UPPER GASTROINTESTINAL ENDOSCOPY    . WISDOM TOOTH EXTRACTION      There were no vitals filed for this visit.  Subjective Assessment - 09/24/18 1455    Subjective  Doing pretty good. Not too much back pain. 7/10 back pain at worst for the past 7 days, 7-8/10 L knee pain at worst. L lateral leg does not really have pain, mostly tingling/funny sensation. L knee buckles at times.  Feels like PT is helping with her back, knee and leg but pt does not really do much at home except going to the store. Thinks she still needs to continue PT in the  clinic. L knee has been buclking for the past 20 years.    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 Pt had L TKA 08/05/2017. Last 3-4 months pt has been having pain L lateral leg. Pt also states feeling pain in her upper back  including her B shoulders. Mid back pain travels down her spine to her low back and feel radiating pain down her L lateral leg. R LE seems fine.   Denies loss of bowel or bladder control, or saddle anesthesia.  No falls within the last 6 months.  Low back and L lateral leg pain seems to have increased since onset.     Currently in Pain?  Yes    Pain Score  7    L knee pain                               PT Education - 09/24/18 1516    Education provided  Yes    Education Details  ther-ex, plan of care    Person(s) Educated  Patient    Methods  Explanation;Demonstration;Tactile cues;Verbal cues    Comprehension  Returned demonstration;Verbalized understanding       Objective    MedbridgeAccess Code:Access Code: [email or web address]   Does not think she has latex allergies  Sitting posture: L trunk rotation  L knee has been buckling for the past 20 years per pt.   Therapeutic exercises  Static standing with light touch assist x 2 minutes 8 seconds.   Reviewed progress with pt.   Reviewed plan of care: 2x/week for 6 weeks  Standing mini squats with B UE assist 10x  Forward step up onto Air Ex pad with L LE and with B UE assist 10x  Lateral step up on the Air Ex pad L LE with B UE assist 5x  Seated LAQ L, 4 lbs 10x3 to promote quad strength and decrease knee buckling   Standing L hip abduction with B UE assist 10x2  Standing R hip flexion with emphasis on level pelvis to promote L glute med muscle strength with B UE assist 5x2  L knee pain which eases with rest.    Improved exercise technique, movement at target joints, use of target muscles after mod verbal, visual, tactile cues.     Response to treatment Good muscle use felt with exercises.Fair tolerance to session.    Clinical impression Similar pain levels at worst since last progress report/recertification but overall better compared to initial evaluation. Pt currently able to maintain standing position with light touch assist for 2 minutes which is an improvement compared to her baseline. Challenges to progress include sedentary lifestyle, age, medical history and chronicity of condition. Pt will benefit from continued skilled physical therapy services to decrease pain, improve LE strength, function, balance, and decrease difficulty with gait.          PT Short Term Goals - 08/08/18 1826      PT SHORT TERM GOAL #1   Title  Patient will be independent with her HEP to improve strength, decrease pain, improve function    Time  3    Period  Weeks    Status  On-going    Target Date  05/23/18        PT Long Term Goals - 09/24/18 1501      PT LONG TERM GOAL #1   Title  Patient will have a decrease in  mid to low back pain to 5/10 or less at worst to promote ability to ambulate more comfortably and tolerate standing longer.     Baseline  8/10 back pain at worst for the past 2 months (possible greater than 8/10 at worst based on pt reports but pt seems to have difficulty with the pain scale; 04/30/2018); 5-6/10 mid to low back pain at most for the past month (08/08/2018); 7/10 back pain at worst for the past 7 days (09/24/2018)    Time  6    Period  Weeks    Status  On-going    Target Date  11/07/18      PT LONG TERM GOAL #2   Title  Pt will have a decrease in L lateral leg pain to 5/10 or less at worst  to promote ability to ambulate more comfortably and tolerate standing longer.     Baseline  10/10 at worst for the past 2 months (04/30/2018); 7-8/10 L lateral leg pain/tingling at worst for the past month (08/08/2018). 7/10 tingling at most for the past 7 days (09/24/2018)    Time  6    Period  Weeks    Status  On-going    Target Date  11/07/18      PT LONG TERM GOAL #3   Title  Patient will have a decrease in L knee joint pain to 5/10 or less at worst  to promote ability to ambulate more comfortably and tolerate standing longer.     Baseline  10/10 L knee pain at worst for the past 2 months (04/30/2018); 8/10 at worst for the past month (08/08/2018); 7-8/10 L knee pain at worst for the past 7 days (09/24/2018)    Time  6    Period  Weeks    Status  On-going    Target Date  11/07/18      PT LONG TERM GOAL #4   Title  Pt will improve B LE strength by at least 1/2 MMT grade  to promote ability to ambulate more comfortably and tolerate  standing longer.     Time  6    Period  Weeks    Status  Partially Met      PT LONG TERM GOAL #5   Title  Pt will report being able to tolerate standing to 5 minutes or more to promote ability to stand at church as well as improve ability to perform chores.     Baseline  Pt able to tolerate standing 1 min per subjective reports. (04/30/2018); half a minute per pt (08/08/2018); 2 minutes 8 seconds with light touch assist (09/24/2018)    Time  6    Period  Weeks    Status  On-going    Target Date  11/07/18            Plan - 09/24/18 1452    Clinical Impression Statement  Similar pain levels at worst since last progress report/recertification but overall better compared to initial evaluation. Pt currently able to maintain standing position with light touch assist for 2 minutes which is an improvement compared to her baseline. Challenges to progress include sedentary lifestyle, age, medical history and chronicity of condition. Pt will benefit from continued skilled physical therapy services to decrease pain, improve LE strength, function, balance, and decrease difficulty with gait.    Personal Factors and Comorbidities  Age;Comorbidity 3+;Fitness;Past/Current Experience;Time since onset of injury/illness/exacerbation    Comorbidities  Memory deficit, rheumatoid arthritis, dyspnea, arthritis, B TKA, hx of  hysterectomy, myelodysplastic syndrome, arthritis multiple areas    Examination-Activity Limitations  Locomotion Level;Carry;Stairs;Stand    Stability/Clinical Decision Making  Stable/Uncomplicated    Clinical Decision Making  Low    Clinical Presentation due to:  Pt making some progress with PT towards goals    Rehab Potential  Fair    Clinical Impairments Affecting Rehab Potential  sedentary lifestyle, age, medical history and chronicity of condition.    PT Frequency  2x / week    PT Duration  6 weeks    PT Treatment/Interventions  Aquatic Therapy;Cryotherapy;Moist Heat;Gait training;Stair  training;Functional mobility training;Therapeutic activities;Therapeutic exercise;Patient/family education;Neuromuscular re-education;Balance training;Manual techniques;Taping;Energy conservation;Passive range of motion;Electrical Stimulation;Iontophoresis 29m/ml Dexamethasone;Dry needling    PT Next Visit Plan  Continue with core strengthening, hip strenghtening.    PT Home Exercise Plan  --    Consulted and Agree with Plan of Care  Patient       Patient will benefit from skilled therapeutic intervention in order to improve the following deficits and impairments:  Abnormal gait, Pain, Postural dysfunction, Decreased strength, Difficulty walking, Improper body mechanics, Decreased balance  Visit Diagnosis: 1. Pain in thoracic spine   2. Radiculopathy, lumbosacral region   3. Muscle weakness (generalized)   4. Difficulty in walking, not elsewhere classified   5. Chronic bilateral low back pain, unspecified whether sciatica present   6. Unsteadiness on feet   7. Chronic pain of left knee        Problem List Patient Active Problem List   Diagnosis Date Noted  . Protein[place]calorie malnutrition ([place] 08/27/2017  . 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 Cubital canal compression syndrome, right 04/18/2017  . Primary osteoarthritis of knee 03/25/2017  . 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 Pure hypercholesterolemia 08/17/2016  . MDS (myelodysplastic syndrome) ([place] 05/12/2016  . B12 deficiency 05/04/2016  . Essential hypertension 05/04/2016  . Mixed stress and urge urinary incontinence 05/04/2016  . Rheumatoid arthritis involving multiple sites with positive rheumatoid factor ([place] 05/04/2016    MJoneen BoersPT, DPT   09/24/2018, 6:55 PM  [place] [facility] [address] [place] [address]Phone: [phone]  Fax:  [phone] Name: JBreianna DelfinoMRN: [id number] of Birth:  [birth year]

## 2018-09-26 ENCOUNTER — Other Ambulatory Visit: Payer: Self-pay

## 2018-09-26 ENCOUNTER — Ambulatory Visit: Payer: Medicare Other

## 2018-09-26 DIAGNOSIS — M546 Pain in thoracic spine: Secondary | ICD-10-CM | POA: Diagnosis not present

## 2018-09-26 DIAGNOSIS — M5417 Radiculopathy, lumbosacral region: Secondary | ICD-10-CM

## 2018-09-26 DIAGNOSIS — M545 Low back pain, unspecified: Secondary | ICD-10-CM

## 2018-09-26 DIAGNOSIS — R262 Difficulty in walking, not elsewhere classified: Secondary | ICD-10-CM

## 2018-09-26 DIAGNOSIS — R2681 Unsteadiness on feet: Secondary | ICD-10-CM

## 2018-09-26 DIAGNOSIS — M25562 Pain in left knee: Secondary | ICD-10-CM

## 2018-09-26 DIAGNOSIS — G8929 Other chronic pain: Secondary | ICD-10-CM

## 2018-09-26 DIAGNOSIS — M6281 Muscle weakness (generalized): Secondary | ICD-10-CM

## 2018-09-26 NOTE — Patient Instructions (Signed)
Reviewed seated hip adduction isometrics HEP for pt to continue performing. Has not been performing the exercise based on pt reports.

## 2018-09-26 NOTE — Therapy (Signed)
[facility] [address] [place], [address] Phone: [phone]   Fax:  [phone]  Physical Therapy Treatment  Patient Details  Name: Sylvia Miller MRN: [id number] Date of Birth: Apr 23, 1940 Referring Provider (PT): Marlowe Sax, MD   Encounter Date: 09/26/2018  PT End of Session - 09/26/18 1538    Visit Number  18    Number of Visits  37    Date for PT Re-Evaluation  11/07/18    Authorization Type  8    Authorization Time Period  of 10 progress report (from 04/30/2018)    PT Start Time  1450    PT Stop Time  1546    PT Time Calculation (min)  56 min    Activity Tolerance  Patient tolerated treatment well;No increased pain;Patient limited by fatigue    Behavior During Therapy  [facility] for tasks assessed/performed       Past Medical History:  Diagnosis Date  . Allergy   . Anemia   . Arthritis    rheumatoid /knees/hands/shoulders (Right shoulder) infusion every 5 weeks  . Bladder incontinence   . Chicken pox   . Dyspnea    with exertion or far walking  . Gastric ulcer   . GERD (gastroesophageal reflux disease)   . Heart murmur   . Hypercholesteremia   . Hyperlipidemia, unspecified   . Hypertension    controlled with meds;   Marland Kitchen MDS (myelodysplastic syndrome) ([place]) 2000  . Memory deficit   . Muscular dystrophy ([place])    per patient, this is incorrect  . Myelodysplastic syndrome ([place])   . Myelodysplastic syndrome ([place])   . Rheumatoid arthritis ([place])   . Ulcer     Past Surgical History:  Procedure Laterality Date  . ABDOMINAL HYSTERECTOMY    . APPENDECTOMY     as a child  . CHOLECYSTECTOMY    . COLONOSCOPY    . COLONOSCOPY WITH PROPOFOL N/A 04/24/2017   Procedure: COLONOSCOPY WITH PROPOFOL;  Surgeon: Lollie Sails, MD;  Location: [facility] ENDOSCOPY;  Service: Endoscopy;  Laterality: N/A;  . ESOPHAGOGASTRODUODENOSCOPY (EGD) WITH PROPOFOL N/A 04/24/2017   Procedure:  ESOPHAGOGASTRODUODENOSCOPY (EGD) WITH PROPOFOL;  Surgeon: Lollie Sails, MD;  Location: [facility] ENDOSCOPY;  Service: Endoscopy;  Laterality: N/A;  . JOINT REPLACEMENT    . KNEE ARTHROPLASTY Right 02/21/2017   Procedure: COMPUTER ASSISTED TOTAL KNEE ARTHROPLASTY;  Surgeon: Dereck Leep, MD;  Location: [facility] ORS;  Service: Orthopedics;  Laterality: Right;  . KNEE ARTHROPLASTY Left 08/06/2017   Procedure: COMPUTER ASSISTED TOTAL KNEE ARTHROPLASTY;  Surgeon: Dereck Leep, MD;  Location: [facility] ORS;  Service: Orthopedics;  Laterality: Left;  . NM INTRAVENOUS INJECTION ([facility] HX)     receives biologic infusions every 5 weeks for rheumatoid arthritis. followed by dr. Meda Coffee at [facility]  . TONSILLECTOMY    . UPPER GASTROINTESTINAL ENDOSCOPY    . WISDOM TOOTH EXTRACTION      There were no vitals filed for this visit.  Subjective Assessment - 09/26/18 1554    Subjective  L knee is still kind of iffy. Back does not feel too bad. No back pain currently. 7-8/10 L knee and leg pain currently.    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 Pt had L TKA 08/05/2017. Last 3-4 months pt has been having pain L lateral leg. Pt also states feeling pain in her upper back  including her B shoulders. Mid back pain travels down her  spine to her low back and feel radiating pain down her L lateral leg. R LE seems fine.   Denies loss of bowel or bladder control, or saddle anesthesia.  No falls within the last 6 months.  Low back and L lateral leg pain seems to have increased since onset.     Currently in Pain?  Yes    Pain Score  8                                PT Education - 09/26/18 1552    Education provided  Yes    Education Details  ther-ex    Person(s) Educated  Patient    Methods  Explanation;Demonstration;Tactile cues;Verbal cues;Handout    Comprehension  Returned demonstration;Verbalized understanding          Objective  MedbridgeAccess  Code:Access Code: [email or web address]   Manual therapy Seated STM L vastus lateralis, lateral hamstrings, iliotibial band  Seated STM L lumbar paraspinal muscles to decrease tension   No change in symptoms     Therapeutic exercise Seated trunk flexion 10 seconds x 2. Increased medial and lateral leg pain  Seated trunk extension isometrics on chair 10x5 seconds for 3 sets  Decreased L medial and lateral leg shooting pain and dull ache  Seated manually resisted L lateral shift in neutral to counter R lateral shift 10x5 seconds for 3 sets  Seated R trunk rotation. Increases symptoms   Seated L trunk rotation. Increases symptoms  Seated trunk flexion. No change in symptoms   Seated trunk extension. Slight decrease in symptoms initially  Seated gentle anterior pelvic tilt 10x. Increased leg tingling on L  Seated L hip extension isometrics 10x5 seconds for 3 sets  Standing R trunk side bend. Increased symptoms.   Standing L trunk side bend. No change in symptoms.    Sitting with folded towel under L hip. Slight decrease in L leg symptoms.   Seated hip adduction manually resisted 10x2 with 5 second holds  Decreased L leg symptoms.    Improved exercise technique, movement at target joints, use of target muscles after mod verbal, visual, tactile cues.    Pt response to treatment  Decreased L leg pain after session   Clinical impression  Decreased L medial and lateral leg symptoms with treatment to improve bilateral hip adductor muscle use and decrease piriformis muscle tension. Pt will benefit from continued skilled physical therapy services to decrease pain, improve strength, function, and improve ability to ambulate.      PT Short Term Goals - 08/08/18 1826      PT SHORT TERM GOAL #1   Title  Patient will be independent with her HEP to improve strength, decrease pain, improve function    Time  3    Period  Weeks    Status  On-going    Target Date  05/23/18         PT Long Term Goals - 09/24/18 1501      PT LONG TERM GOAL #1   Title  Patient will have a decrease in mid to low back pain to 5/10 or less at worst to promote ability to ambulate more comfortably and tolerate standing longer.     Baseline  8/10 back pain at worst for the past 2 months (possible greater than 8/10 at worst based on pt reports but pt seems to have difficulty with the pain scale; 04/30/2018); 5-6/10 mid to low back  pain at most for the past month (08/08/2018); 7/10 back pain at worst for the past 7 days (09/24/2018)    Time  6    Period  Weeks    Status  On-going    Target Date  11/07/18      PT LONG TERM GOAL #2   Title  Pt will have a decrease in L lateral leg pain to 5/10 or less at worst  to promote ability to ambulate more comfortably and tolerate standing longer.     Baseline  10/10 at worst for the past 2 months (04/30/2018); 7-8/10 L lateral leg pain/tingling at worst for the past month (08/08/2018). 7/10 tingling at most for the past 7 days (09/24/2018)    Time  6    Period  Weeks    Status  On-going    Target Date  11/07/18      PT LONG TERM GOAL #3   Title  Patient will have a decrease in L knee joint pain to 5/10 or less at worst  to promote ability to ambulate more comfortably and tolerate standing longer.     Baseline  10/10 L knee pain at worst for the past 2 months (04/30/2018); 8/10 at worst for the past month (08/08/2018); 7-8/10 L knee pain at worst for the past 7 days (09/24/2018)    Time  6    Period  Weeks    Status  On-going    Target Date  11/07/18      PT LONG TERM GOAL #4   Title  Pt will improve B LE strength by at least 1/2 MMT grade  to promote ability to ambulate more comfortably and tolerate standing longer.     Time  6    Period  Weeks    Status  Partially Met      PT LONG TERM GOAL #5   Title  Pt will report being able to tolerate standing to 5 minutes or more to promote ability to stand at church as well as improve ability to perform chores.      Baseline  Pt able to tolerate standing 1 min per subjective reports. (04/30/2018); half a minute per pt (08/08/2018); 2 minutes 8 seconds with light touch assist (09/24/2018)    Time  6    Period  Weeks    Status  On-going    Target Date  11/07/18            Plan - 09/26/18 1552    Clinical Impression Statement  Decreased L medial and lateral leg symptoms with treatment to improve bilateral hip adductor muscle use and decrease piriformis muscle tension. Pt will benefit from continued skilled physical therapy services to decrease pain, improve strength, function, and improve ability to ambulate.    Personal Factors and Comorbidities  Age;Comorbidity 3+;Fitness;Past/Current Experience;Time since onset of injury/illness/exacerbation    Comorbidities  Memory deficit, rheumatoid arthritis, dyspnea, arthritis, B TKA, hx of hysterectomy, myelodysplastic syndrome, arthritis multiple areas    Examination-Activity Limitations  Locomotion Level;Carry;Stairs;Stand    Stability/Clinical Decision Making  Stable/Uncomplicated    Rehab Potential  Fair    Clinical Impairments Affecting Rehab Potential  sedentary lifestyle, age, medical history and chronicity of condition.    PT Frequency  2x / week    PT Duration  6 weeks    PT Treatment/Interventions  Aquatic Therapy;Cryotherapy;Moist Heat;Gait training;Stair training;Functional mobility training;Therapeutic activities;Therapeutic exercise;Patient/family education;Neuromuscular re-education;Balance training;Manual techniques;Taping;Energy conservation;Passive range of motion;Electrical Stimulation;Iontophoresis 69m/ml Dexamethasone;Dry needling    PT Next  Visit Plan  Continue with core strengthening, hip strenghtening.    Consulted and Agree with Plan of Care  Patient       Patient will benefit from skilled therapeutic intervention in order to improve the following deficits and impairments:  Abnormal gait, Pain, Postural dysfunction, Decreased  strength, Difficulty walking, Improper body mechanics, Decreased balance  Visit Diagnosis: 1. Pain in thoracic spine   2. Radiculopathy, lumbosacral region   3. Muscle weakness (generalized)   4. Difficulty in walking, not elsewhere classified   5. Chronic bilateral low back pain, unspecified whether sciatica present   6. Unsteadiness on feet   7. Chronic pain of left knee        Problem List Patient Active Problem List   Diagnosis Date Noted  . Protein-calorie malnutrition ([place]) 08/27/2017  . 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 Cubital canal compression syndrome, right 04/18/2017  . Primary osteoarthritis of knee 03/25/2017  . 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 Pure hypercholesterolemia 08/17/2016  . MDS (myelodysplastic syndrome) ([place]) 05/12/2016  . B12 deficiency 05/04/2016  . Essential hypertension 05/04/2016  . Mixed stress and urge urinary incontinence 05/04/2016  . Rheumatoid arthritis involving multiple sites with positive rheumatoid factor ([place]) 05/04/2016    Joneen Boers PT, DPT   09/26/2018, 3:56 PM  [facility] [address] [place], [address] Phone: [phone]   Fax:  [phone]  Name: Sylvia Miller MRN: [id number] Date of Birth: 12/30/1940

## 2018-09-30 ENCOUNTER — Other Ambulatory Visit: Payer: Self-pay

## 2018-09-30 ENCOUNTER — Ambulatory Visit: Payer: Medicare Other

## 2018-09-30 DIAGNOSIS — M546 Pain in thoracic spine: Secondary | ICD-10-CM

## 2018-09-30 DIAGNOSIS — R262 Difficulty in walking, not elsewhere classified: Secondary | ICD-10-CM

## 2018-09-30 DIAGNOSIS — G8929 Other chronic pain: Secondary | ICD-10-CM

## 2018-09-30 DIAGNOSIS — M6281 Muscle weakness (generalized): Secondary | ICD-10-CM

## 2018-09-30 DIAGNOSIS — M5417 Radiculopathy, lumbosacral region: Secondary | ICD-10-CM

## 2018-09-30 DIAGNOSIS — R2681 Unsteadiness on feet: Secondary | ICD-10-CM

## 2018-09-30 NOTE — Therapy (Signed)
[facility] [address] [place], [address] Phone: [phone]   Fax:  [phone]  Physical Therapy Treatment  Patient Details  Name: Sylvia Miller MRN: [id number] Date of Birth: [birth year] Referring Provider (PT): Marlowe Sax, MD   Encounter Date: 09/30/2018  PT End of Session - 09/30/18 1308    Visit Number  19    Number of Visits  37    Date for PT Re-Evaluation  11/07/18    Authorization Type  9    Authorization Time Period  of 10 progress report (from 04/30/2018)    PT Start Time  1313    PT Stop Time  1358    PT Time Calculation (min)  45 min    Activity Tolerance  Patient tolerated treatment well;No increased pain;Patient limited by fatigue    Behavior During Therapy  [facility] for tasks assessed/performed       Past Medical History:  Diagnosis Date  . Allergy   . Anemia   . Arthritis    rheumatoid /knees/hands/shoulders (Right shoulder) infusion every 5 weeks  . Bladder incontinence   . Chicken pox   . Dyspnea    with exertion or far walking  . Gastric ulcer   . GERD (gastroesophageal reflux disease)   . Heart murmur   . Hypercholesteremia   . Hyperlipidemia, unspecified   . Hypertension    controlled with meds;   Marland Kitchen MDS (myelodysplastic syndrome) ([place]) 2000  . Memory deficit   . Muscular dystrophy ([place])    per patient, this is incorrect  . Myelodysplastic syndrome ([place])   . Myelodysplastic syndrome ([place])   . Rheumatoid arthritis ([place])   . Ulcer     Past Surgical History:  Procedure Laterality Date  . ABDOMINAL HYSTERECTOMY    . APPENDECTOMY     as a child  . CHOLECYSTECTOMY    . COLONOSCOPY    . COLONOSCOPY WITH PROPOFOL N/A 04/24/2017   Procedure: COLONOSCOPY WITH PROPOFOL;  Surgeon: Lollie Sails, MD;  Location: [facility] ENDOSCOPY;  Service: Endoscopy;  Laterality: N/A;  . ESOPHAGOGASTRODUODENOSCOPY (EGD) WITH PROPOFOL N/A 04/24/2017   Procedure:  ESOPHAGOGASTRODUODENOSCOPY (EGD) WITH PROPOFOL;  Surgeon: Lollie Sails, MD;  Location: [facility] ENDOSCOPY;  Service: Endoscopy;  Laterality: N/A;  . JOINT REPLACEMENT    . KNEE ARTHROPLASTY Right 02/21/2017   Procedure: COMPUTER ASSISTED TOTAL KNEE ARTHROPLASTY;  Surgeon: Dereck Leep, MD;  Location: [facility] ORS;  Service: Orthopedics;  Laterality: Right;  . KNEE ARTHROPLASTY Left 08/06/2017   Procedure: COMPUTER ASSISTED TOTAL KNEE ARTHROPLASTY;  Surgeon: Dereck Leep, MD;  Location: [facility] ORS;  Service: Orthopedics;  Laterality: Left;  . NM INTRAVENOUS INJECTION ([facility] HX)     receives biologic infusions every 5 weeks for rheumatoid arthritis. followed by dr. Meda Coffee at [place] [facility]  . TONSILLECTOMY    . UPPER GASTROINTESTINAL ENDOSCOPY    . WISDOM TOOTH EXTRACTION      There were no vitals filed for this visit.  Subjective Assessment - 09/30/18 1320    Subjective  Pt husband said that pt fell this past Saturday when she stood up to sit down on the dining room chair. Did not break anything. No pain in back currently.Pt states no light headedness or dizziness or passing out when she fell. She was just trying to say goodbye to her daughter as her husband turned around.  They have been putting ice on her L knee since then.  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 Pt had L TKA 08/05/2017. Last 3-4 months pt has been having pain L lateral leg. Pt also states feeling pain in her upper back  including her B shoulders. Mid back pain travels down her spine to her low back and feel radiating pain down her L lateral leg. R LE seems fine.   Denies loss of bowel or bladder control, or saddle anesthesia.  No falls within the last 6 months.  Low back and L lateral leg pain seems to have increased since onset.     Currently in Pain?  No/denies         [facility] PT Assessment - 09/30/18 1340      Berg Balance Test   Sit to Stand  Able to stand   independently using hands    Standing Unsupported  Able to stand safely 2 minutes    Sitting with Back Unsupported but Feet Supported on Floor or Stool  Able to sit safely and securely 2 minutes    Stand to Sit  Controls descent by using hands    Transfers  Able to transfer safely, definite need of hands    Standing Unsupported with Eyes Closed  Able to stand 3 seconds   backward weight shifting   Standing Unsupported with Feet Together  Able to place feet together independently but unable to hold for 30 seconds    From Standing, Reach Forward with Outstretched Arm  Can reach forward >5 cm safely (2")    From Standing Position, Pick up Object from Floor  Able to pick up shoe, needs supervision    From Standing Position, Turn to Look Behind Over each Shoulder  Looks behind one side only/other side shows less weight shift    Turn 360 Degrees  Able to turn 360 degrees safely but slowly    Standing Unsupported, Alternately Place Feet on Step/Stool  Able to complete >2 steps/needs minimal assist    Standing Unsupported, One Foot in Front  Needs help to step but can hold 15 seconds    Standing on One Leg  Unable to try or needs assist to prevent fall    Total Score  33    Berg comment:  < 46/56 suggests increased need for AD and increased fall risk                           PT Education - 09/30/18 1418    Education provided  Yes    Education Details  ther-ex    [employer]) Educated  Patient    Methods  Explanation;Demonstration;Tactile cues;Verbal cues    Comprehension  Returned demonstration;Verbalized understanding        Objective  MedbridgeAccess Code:Access Code: [email or web address]  Pt states no light headedness or dizziness or passing out when she fell. She was just trying to say goodbye to her daughter as her husband turned around.  They have been putting ice on her L knee since then.     Therapeutic exercise  No TTP B hip  TTP L medial and lateral knee, medial  knee bruising observed. Bruising R eye and nose (from glasses) and slightly on L eye where glasses frame would be. No complain of blurred vision or headaches  Standing up from a chair, walking 10 ft forward, then returning 10 ft, then sitting back onto chair 3x  Using SPC. 27.6 seconds (no L knee pain per pt). 26.  82 seconds, 24.44 seconds   (26.3 seconds on average using SPC)  No L lateral leg tingling sensation with gait. No complain of knee pain.     Directed patient with sit <> stand throughout session Stand pivot transfer chair <> chair without arms Static standing shoulder width apart, then with eyes closed, then with eyes open feet together, tandem stance with feet shoulder width apart Picking up an object (1 lb weight) from the floor,  Turning 360 degrees to the R and to the L 1x Looking behind to the R and to the L,  Standing forward reach,   Standing alternate toe taps 4 x each LE onto step   Seated glute max squeeze 10x5 seconds  Recommended for pt to call her surgeon for her L knee to make sure everything is ok with it secondary to fall. Also to let her MD know about the discomfort R anterior hip. Pt and husband/caregiver verbalized understanding     Improved exercise technique, movement at target joints, use of target muscles after mod verbal, visual, tactile cues.     Pt response to treatment Pt tolerated session fair. Some L knee/lateral leg pain/discomfort with standing exercises but seems similar compared to her usual symptoms. Demonstrates posterior weight shifting and lean when standing with her eyes closed.    Clinical impression Worked on balance related activities today secondary to history of fall and unsteadiness. Pt scored 33/56 with the Merrilee Jansky and demonstrated an average time of 26.3 seconds with the TUG using her SPC suggesting risk for falls. Difficulty with balance related exercises involving decreased base of support. Pt was recommended to call her L  knee surgeon pertaining the her knee and the fall as well as to check her R hip to make sure everything is ok. Pt and husband/caregiver verbalized understanding. Fair tolerance to today's session with some L knee/lateral leg symptoms at times with standing exercises but seems similar to her usual symptoms prior to her fall. Pt will benefit from continued skilled physical therapy services to decrease pain, improve strength, function, balance, decrease fall risk, and decrease difficulty with gait.       PT Short Term Goals - 08/08/18 1826      PT SHORT TERM GOAL #1   Title  Patient will be independent with her HEP to improve strength, decrease pain, improve function    Time  3    Period  Weeks    Status  On-going    Target Date  05/23/18        PT Long Term Goals - 09/24/18 1501      PT LONG TERM GOAL #1   Title  Patient will have a decrease in mid to low back pain to 5/10 or less at worst to promote ability to ambulate more comfortably and tolerate standing longer.     Baseline  8/10 back pain at worst for the past 2 months (possible greater than 8/10 at worst based on pt reports but pt seems to have difficulty with the pain scale; 04/30/2018); 5-6/10 mid to low back pain at most for the past month (08/08/2018); 7/10 back pain at worst for the past 7 days (09/24/2018)    Time  6    Period  Weeks    Status  On-going    Target Date  11/07/18      PT LONG TERM GOAL #2   Title  Pt will have a decrease in L lateral leg pain to 5/10 or less at  worst  to promote ability to ambulate more comfortably and tolerate standing longer.     Baseline  10/10 at worst for the past 2 months (04/30/2018); 7-8/10 L lateral leg pain/tingling at worst for the past month (08/08/2018). 7/10 tingling at most for the past 7 days (09/24/2018)    Time  6    Period  Weeks    Status  On-going    Target Date  11/07/18      PT LONG TERM GOAL #3   Title  Patient will have a decrease in L knee joint pain to 5/10 or less at  worst  to promote ability to ambulate more comfortably and tolerate standing longer.     Baseline  10/10 L knee pain at worst for the past 2 months (04/30/2018); 8/10 at worst for the past month (08/08/2018); 7-8/10 L knee pain at worst for the past 7 days (09/24/2018)    Time  6    Period  Weeks    Status  On-going    Target Date  11/07/18      PT LONG TERM GOAL #4   Title  Pt will improve B LE strength by at least 1/2 MMT grade  to promote ability to ambulate more comfortably and tolerate standing longer.     Time  6    Period  Weeks    Status  Partially Met      PT LONG TERM GOAL #5   Title  Pt will report being able to tolerate standing to 5 minutes or more to promote ability to stand at church as well as improve ability to perform chores.     Baseline  Pt able to tolerate standing 1 min per subjective reports. (04/30/2018); half a minute per pt (08/08/2018); 2 minutes 8 seconds with light touch assist (09/24/2018)    Time  6    Period  Weeks    Status  On-going    Target Date  11/07/18            Plan - 09/30/18 1307    Clinical Impression Statement  Worked on balance related activities today secondary to history of fall and unsteadiness. Pt scored 33/56 with the Merrilee Jansky and demonstrated an average time of 26.3 seconds with the TUG using her SPC suggesting risk for falls. Difficulty with balance related exercises involving decreased base of support. Pt was recommended to call her L knee surgeon pertaining the her knee and the fall as well as to check her R hip to make sure everything is ok. Pt and husband/caregiver verbalized understanding. Fair tolerance to today's session with some L knee/lateral leg symptoms at times with standing exercises but seems similar to her usual symptoms prior to her fall. Pt will benefit from continued skilled physical therapy services to decrease pain, improve strength, function, balance, decrease fall risk, and decrease difficulty with gait.    Personal Factors  and Comorbidities  Age;Comorbidity 3+;Fitness;Past/Current Experience;Time since onset of injury/illness/exacerbation    Comorbidities  Memory deficit, rheumatoid arthritis, dyspnea, arthritis, B TKA, hx of hysterectomy, myelodysplastic syndrome, arthritis multiple areas    Examination-Activity Limitations  Locomotion Level;Carry;Stairs;Stand    Stability/Clinical Decision Making  Stable/Uncomplicated    Rehab Potential  Fair    Clinical Impairments Affecting Rehab Potential  sedentary lifestyle, age, medical history and chronicity of condition.    PT Frequency  2x / week    PT Duration  6 weeks    PT Treatment/Interventions  Aquatic Therapy;Cryotherapy;Moist Heat;Gait training;Stair training;Functional mobility  training;Therapeutic activities;Therapeutic exercise;Patient/family education;Neuromuscular re-education;Balance training;Manual techniques;Taping;Energy conservation;Passive range of motion;Electrical Stimulation;Iontophoresis 40m/ml Dexamethasone;Dry needling    PT Next Visit Plan  Continue with core strengthening, hip strenghtening.    Consulted and Agree with Plan of Care  Patient       Patient will benefit from skilled therapeutic intervention in order to improve the following deficits and impairments:  Abnormal gait, Pain, Postural dysfunction, Decreased strength, Difficulty walking, Improper body mechanics, Decreased balance  Visit Diagnosis: 1. Pain in thoracic spine   2. Radiculopathy, lumbosacral region   3. Muscle weakness (generalized)   4. Difficulty in walking, not elsewhere classified   5. Chronic bilateral low back pain, unspecified whether sciatica present   6. Unsteadiness on feet   7. Chronic pain of left knee        Problem List Patient Active Problem List   Diagnosis Date Noted  . Protein-calorie malnutrition ([place] 08/27/2017  . 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 Cubital canal compression syndrome, right 04/18/2017  . Primary osteoarthritis of  knee 03/25/2017  . 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 Pure hypercholesterolemia 08/17/2016  . MDS (myelodysplastic syndrome) ([place] 05/12/2016  . B12 deficiency 05/04/2016  . Essential hypertension 05/04/2016  . Mixed stress and urge urinary incontinence 05/04/2016  . Rheumatoid arthritis involving multiple sites with positive rheumatoid factor ([place] 05/04/2016    MJoneen BoersPT, DPT   09/30/2018, 2:35 PM  [facility] [address] [place] [id number]: [phone]  Fax:  [phone] Name: Sylvia WestbayMRN: [id number] of Birth: 3November 04, 1942

## 2018-10-01 ENCOUNTER — Inpatient Hospital Stay: Payer: Medicare Other | Attending: Oncology

## 2018-10-01 ENCOUNTER — Inpatient Hospital Stay: Payer: Medicare Other | Admitting: Oncology

## 2018-10-02 ENCOUNTER — Other Ambulatory Visit: Payer: Self-pay

## 2018-10-02 ENCOUNTER — Ambulatory Visit: Payer: Medicare Other

## 2018-10-02 DIAGNOSIS — G8929 Other chronic pain: Secondary | ICD-10-CM

## 2018-10-02 DIAGNOSIS — R2681 Unsteadiness on feet: Secondary | ICD-10-CM

## 2018-10-02 DIAGNOSIS — M6281 Muscle weakness (generalized): Secondary | ICD-10-CM

## 2018-10-02 DIAGNOSIS — M5417 Radiculopathy, lumbosacral region: Secondary | ICD-10-CM

## 2018-10-02 DIAGNOSIS — M546 Pain in thoracic spine: Secondary | ICD-10-CM | POA: Diagnosis not present

## 2018-10-02 DIAGNOSIS — R262 Difficulty in walking, not elsewhere classified: Secondary | ICD-10-CM

## 2018-10-02 DIAGNOSIS — M545 Low back pain, unspecified: Secondary | ICD-10-CM

## 2018-10-02 NOTE — Therapy (Signed)
[place] [facility] [address], [place], [address] Phone: [phone]   Fax:  [phone]  Physical Therapy Treatment And Progress Report  (08/19/2018 - 10/02/2018)  Patient Details  Name: Sylvia Miller MRN: [id number] Date of Birth: [birth year] Referring Provider (PT): Marlowe Sax, MD   Encounter Date: 10/02/2018  PT End of Session - 10/02/18 1315    Visit Number  20    Number of Visits  37    Date for PT Re-Evaluation  11/07/18    Authorization Type  10    Authorization Time Period  of 10 progress report (from 04/30/2018)    PT Start Time  7096    PT Stop Time  1400    PT Time Calculation (min)  43 min    Activity Tolerance  Patient tolerated treatment well;No increased pain;Patient limited by fatigue    Behavior During Therapy  [facility] for tasks assessed/performed       Past Medical History:  Diagnosis Date  . Allergy   . Anemia   . Arthritis    rheumatoid /knees/hands/shoulders (Right shoulder) infusion every 5 weeks  . Bladder incontinence   . Chicken pox   . Dyspnea    with exertion or far walking  . Gastric ulcer   . GERD (gastroesophageal reflux disease)   . Heart murmur   . Hypercholesteremia   . Hyperlipidemia, unspecified   . Hypertension    controlled with meds;   Marland Kitchen MDS (myelodysplastic syndrome) ([place]) 2000  . Memory deficit   . Muscular dystrophy ([place])    per patient, this is incorrect  . Myelodysplastic syndrome ([place])   . Myelodysplastic syndrome ([place])   . Rheumatoid arthritis ([place])   . Ulcer     Past Surgical History:  Procedure Laterality Date  . ABDOMINAL HYSTERECTOMY    . APPENDECTOMY     as a child  . CHOLECYSTECTOMY    . COLONOSCOPY    . COLONOSCOPY WITH PROPOFOL N/A 04/24/2017   Procedure: COLONOSCOPY WITH PROPOFOL;  Surgeon: Lollie Sails, MD;  Location: [facility] ENDOSCOPY;  Service: Endoscopy;  Laterality: N/A;  . ESOPHAGOGASTRODUODENOSCOPY (EGD) WITH PROPOFOL  N/A 04/24/2017   Procedure: ESOPHAGOGASTRODUODENOSCOPY (EGD) WITH PROPOFOL;  Surgeon: Lollie Sails, MD;  Location: [facility] ENDOSCOPY;  Service: Endoscopy;  Laterality: N/A;  . JOINT REPLACEMENT    . KNEE ARTHROPLASTY Right 02/21/2017   Procedure: COMPUTER ASSISTED TOTAL KNEE ARTHROPLASTY;  Surgeon: Dereck Leep, MD;  Location: [facility] ORS;  Service: Orthopedics;  Laterality: Right;  . KNEE ARTHROPLASTY Left 08/06/2017   Procedure: COMPUTER ASSISTED TOTAL KNEE ARTHROPLASTY;  Surgeon: Dereck Leep, MD;  Location: [facility] ORS;  Service: Orthopedics;  Laterality: Left;  . NM INTRAVENOUS INJECTION ([facility] HX)     receives biologic infusions every 5 weeks for rheumatoid arthritis. followed by dr. Meda Coffee at [facility]  . TONSILLECTOMY    . UPPER GASTROINTESTINAL ENDOSCOPY    . WISDOM TOOTH EXTRACTION      There were no vitals filed for this visit.  Subjective Assessment - 10/02/18 1319    Subjective  Doing ok except for the heat. No back pain currently. Could not get through when trying to call her doctor. 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 Pt had L TKA 08/05/2017. Last 3-4 months pt has been having pain L lateral leg. Pt also states feeling pain in her upper back  including her  B shoulders. Mid back pain travels down her spine to her low back and feel radiating pain down her L lateral leg. R LE seems fine.   Denies loss of bowel or bladder control, or saddle anesthesia.  No falls within the last 6 months.  Low back and L lateral leg pain seems to have increased since onset.     Currently in Pain?  No/denies    Pain Score  0-No pain         [facility] PT Assessment - 10/02/18 0001      Special Tests   Other special tests  TUG: 26.3 seconds average using SPC (measured on 09/30/2018)      Berg Balance Test   Sit to Stand  Able to stand  independently using hands    Standing Unsupported  Able to stand safely 2 minutes    Sitting with  Back Unsupported but Feet Supported on Floor or Stool  Able to sit safely and securely 2 minutes    Stand to Sit  Controls descent by using hands    Transfers  Able to transfer safely, definite need of hands    Standing Unsupported with Eyes Closed  Able to stand 3 seconds   backward weight shifting   Standing Unsupported with Feet Together  Able to place feet together independently but unable to hold for 30 seconds    From Standing, Reach Forward with Outstretched Arm  Can reach forward >5 cm safely (2")    From Standing Position, Pick up Object from Floor  Able to pick up shoe, needs supervision    From Standing Position, Turn to Look Behind Over each Shoulder  Looks behind one side only/other side shows less weight shift    Turn 360 Degrees  Able to turn 360 degrees safely but slowly    Standing Unsupported, Alternately Place Feet on Step/Stool  Able to complete >2 steps/needs minimal assist    Standing Unsupported, One Foot in Front  Needs help to step but can hold 15 seconds    Standing on One Leg  Unable to try or needs assist to prevent fall    Total Score  33    Berg comment:  < 46/56 suggests increased need for AD and increased fall risk   Measured on 09/30/2018                          PT Education - 10/02/18 1339    Education provided  Yes    Education Details  ther-ex    [employer]) Educated  Patient    Methods  Explanation;Demonstration;Tactile cues;Verbal cues    Comprehension  Returned demonstration;Verbalized understanding      Objective  MedbridgeAccess Code:Access Code: [email or web address]   Therapeutic exercise  Standing L hip abduction with B UE assist 10x3 to promote glute med muscle strengthening.   Sit <> Stand from regular chair with arms 5 x  Seated clamshell isometrics, hips less than 90 degrees flexion 10x5 seconds for 3 sets   Side stepping with B UE assist from treadmill bar 5 ft to the R and 5 ft to the L 3x CGA  Standing  alternating toe taps onto treadmill platform with B UE assist 10x2 each LE   Standing hip extension to promote glute muscle strengthening  L 10x  R 10x  Slight cramp posterior L LE which eases with sitting rest.   Seated hip adduction ball and glute max squeeze 10x5 seconds for 3  sets  Static standing with eyes closed, no UE assist 10 seconds 5x. Better able to maintain her center of gravity over his base of support (between her feet) and less backwards lean compared to previous session.      Improved exercise technique, movement at target joints, use of target muscles after mod verbal, visual, tactile cues.       Pt response to treatment Pt tolerated session well without aggravation of L knee, leg, or back symptoms.    Clinical impression  Pt experienced a fall a few days ago on 09/28/2018 when pt was trying to sit down. Bruising observed at her face where her eye glasses would sit as well as at her L anterior medial knee (bruise at knee decreasing). Pt able to perform standing exercises today with minimal to no complain of L knee pain. Worked on LE strengthening in standing, especially for her glute muscles to promote pelvic control with standing tasks, walking, and to help decrease fall risk. Pt overall seems to be improving standing activity tolerance observed today with pt able to perform more standing exercises today compared to previous visits. Pt also able to maintain 2 minutes of standing as measured on 09/24/2018 which is longer that her initial length of time. Similar pain levels at worst since last progress report but overall better compared to initial evaluation. Challenges to progress include fall risk, sedentary lifestyle, age, medical history and chronicity of condition. Pt will benefit from continued skilled physical therapy services to decrease pain, improve strength, balance and function.          PT Short Term Goals - 08/08/18 1826      PT SHORT TERM GOAL #1    Title  Patient will be independent with her HEP to improve strength, decrease pain, improve function    Time  3    Period  Weeks    Status  On-going    Target Date  05/23/18        PT Long Term Goals - 10/02/18 1723      PT LONG TERM GOAL #1   Title  Patient will have a decrease in mid to low back pain to 5/10 or less at worst to promote ability to ambulate more comfortably and tolerate standing longer.     Baseline  8/10 back pain at worst for the past 2 months (possible greater than 8/10 at worst based on pt reports but pt seems to have difficulty with the pain scale; 04/30/2018); 5-6/10 mid to low back pain at most for the past month (08/08/2018); 7/10 back pain at worst for the past 7 days (09/24/2018)    Time  6    Period  Weeks    Status  On-going    Target Date  11/07/18      PT LONG TERM GOAL #2   Title  Pt will have a decrease in L lateral leg pain to 5/10 or less at worst  to promote ability to ambulate more comfortably and tolerate standing longer.     Baseline  10/10 at worst for the past 2 months (04/30/2018); 7-8/10 L lateral leg pain/tingling at worst for the past month (08/08/2018). 7/10 tingling at most for the past 7 days (09/24/2018)    Time  6    Period  Weeks    Status  On-going    Target Date  11/07/18      PT LONG TERM GOAL #3   Title  Patient will have a decrease in  L knee joint pain to 5/10 or less at worst  to promote ability to ambulate more comfortably and tolerate standing longer.     Baseline  10/10 L knee pain at worst for the past 2 months (04/30/2018); 8/10 at worst for the past month (08/08/2018); 7-8/10 L knee pain at worst for the past 7 days (09/24/2018)    Time  6    Period  Weeks    Status  On-going    Target Date  11/07/18      PT LONG TERM GOAL #4   Title  Pt will improve B LE strength by at least 1/2 MMT grade  to promote ability to ambulate more comfortably and tolerate standing longer.     Time  6    Period  Weeks    Status  Partially Met     Target Date  11/07/18      PT LONG TERM GOAL #5   Title  Pt will report being able to tolerate standing to 5 minutes or more to promote ability to stand at church as well as improve ability to perform chores.     Baseline  Pt able to tolerate standing 1 min per subjective reports. (04/30/2018); half a minute per pt (08/08/2018); 2 minutes 8 seconds with light touch assist (09/24/2018)    Time  6    Period  Weeks    Status  On-going    Target Date  11/07/18      Additional Long Term Goals   Additional Long Term Goals  Yes      PT LONG TERM GOAL #6   Title  Patient will improve her TUG time using SPC to 15 seconds or less as a demonstration of improved functional mobility and balance.    Baseline  26.3 seconds on average using SPC (09/30/2018)    Time  5    Period  Weeks    Status  New    Target Date  11/07/18      PT LONG TERM GOAL #7   Title  Patient will improve her Berg Balance Test score to 40/56 or more as a demonstration of improved balance and decreased fall risk.    Baseline  33/56 (09/30/2018)    Time  5    Period  Weeks    Status  New    Target Date  11/07/18            Plan - 10/02/18 1306    Clinical Impression Statement  Pt experienced a fall a few days ago on 09/28/2018 when pt was trying to sit down. Bruising observed at her face where her eye glasses would sit as well as at her L anterior medial knee (bruise at knee decreasing). Pt able to perform standing exercises today with minimal to no complain of L knee pain. Worked on LE strengthening in standing, especially for her glute muscles to promote pelvic control with standing tasks, walking, and to help decrease fall risk. Pt overall seems to be improving standing activity tolerance observed today with pt able to perform more standing exercises today compared to previous visits. Pt also able to maintain 2 minutes of standing as measured on 09/24/2018 which is longer that her initial length of time. Similar pain levels at  worst since last progress report but overall better compared to initial evaluation. Challenges to progress include fall risk, sedentary lifestyle, age, medical history and chronicity of condition. Pt will benefit from continued skilled physical therapy services to  decrease pain, improve strength, balance and function.    Personal Factors and Comorbidities  Age;Comorbidity 3+;Fitness;Past/Current Experience;Time since onset of injury/illness/exacerbation    Comorbidities  Memory deficit, rheumatoid arthritis, dyspnea, arthritis, B TKA, hx of hysterectomy, myelodysplastic syndrome, arthritis multiple areas    Examination-Activity Limitations  Locomotion Level;Carry;Stairs;Stand    Stability/Clinical Decision Making  Stable/Uncomplicated    Clinical Decision Making  Moderate    Clinical Presentation due to:  recent fall    Rehab Potential  Fair    Clinical Impairments Affecting Rehab Potential  sedentary lifestyle, age, medical history and chronicity of condition, fall risk    PT Frequency  2x / week    PT Duration  6 weeks    PT Treatment/Interventions  Aquatic Therapy;Cryotherapy;Moist Heat;Gait training;Stair training;Functional mobility training;Therapeutic activities;Therapeutic exercise;Patient/family education;Neuromuscular re-education;Balance training;Manual techniques;Taping;Energy conservation;Passive range of motion;Electrical Stimulation;Iontophoresis 54m/ml Dexamethasone;Dry needling    PT Next Visit Plan  Continue with core strengthening, hip strenghtening.    PT HNewport NewsAccess code: [email or web address]   Consulted and Agree with Plan of Care  Patient       Patient will benefit from skilled therapeutic intervention in order to improve the following deficits and impairments:  Abnormal gait, Pain, Postural dysfunction, Decreased strength, Difficulty walking, Improper body mechanics, Decreased balance  Visit Diagnosis: 1. Pain in thoracic spine   2. Radiculopathy,  lumbosacral region   3. Muscle weakness (generalized)   4. Difficulty in walking, not elsewhere classified   5. Chronic bilateral low back pain, unspecified whether sciatica present   6. Unsteadiness on feet   7. Chronic pain of left knee        Problem List Patient Active Problem List   Diagnosis Date Noted  . Protein-calorie malnutrition ([place] 08/27/2017  . 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 Cubital canal compression syndrome, right 04/18/2017  . Primary osteoarthritis of knee 03/25/2017  . 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 Pure hypercholesterolemia 08/17/2016  . MDS (myelodysplastic syndrome) ([place] 05/12/2016  . B12 deficiency 05/04/2016  . Essential hypertension 05/04/2016  . Mixed stress and urge urinary incontinence 05/04/2016  . Rheumatoid arthritis involving multiple sites with positive rheumatoid factor ([place] 05/04/2016    Thank you for your referral.   MJoneen BoersPT, DPT   10/02/2018, 5:32 PM  [facility] [address] [place] [address]Phone: [phone]  Fax:  [phone] Name: Sylvia BoulaisMRN: [id number] of Birth: 327-Oct-1942

## 2018-10-08 ENCOUNTER — Other Ambulatory Visit: Payer: Self-pay

## 2018-10-08 ENCOUNTER — Ambulatory Visit: Payer: Medicare Other | Attending: Internal Medicine

## 2018-10-08 DIAGNOSIS — M546 Pain in thoracic spine: Secondary | ICD-10-CM | POA: Diagnosis not present

## 2018-10-08 DIAGNOSIS — R262 Difficulty in walking, not elsewhere classified: Secondary | ICD-10-CM | POA: Diagnosis present

## 2018-10-08 DIAGNOSIS — M545 Low back pain: Secondary | ICD-10-CM | POA: Diagnosis present

## 2018-10-08 DIAGNOSIS — M5417 Radiculopathy, lumbosacral region: Secondary | ICD-10-CM | POA: Diagnosis present

## 2018-10-08 DIAGNOSIS — G8929 Other chronic pain: Secondary | ICD-10-CM | POA: Diagnosis present

## 2018-10-08 DIAGNOSIS — R2681 Unsteadiness on feet: Secondary | ICD-10-CM | POA: Diagnosis present

## 2018-10-08 DIAGNOSIS — M6281 Muscle weakness (generalized): Secondary | ICD-10-CM | POA: Insufficient documentation

## 2018-10-08 DIAGNOSIS — M25562 Pain in left knee: Secondary | ICD-10-CM | POA: Insufficient documentation

## 2018-10-08 NOTE — Therapy (Signed)
[facility] [address] [place], [address] Phone: [phone]   Fax:  [phone]  Physical Therapy Treatment  Patient Details  Name: Sylvia Miller MRN: [id number] Date of Birth: 01-30-1941 Referring Provider (PT): Marlowe Sax, MD   Encounter Date: 10/08/2018  PT End of Session - 10/08/18 1304    Visit Number  21    Number of Visits  37    Date for PT Re-Evaluation  11/07/18    Authorization Type  1    Authorization Time Period  of 10 progress report (from 04/30/2018)    PT Start Time  1304    PT Stop Time  9702    PT Time Calculation (min)  45 min    Activity Tolerance  Patient tolerated treatment well;No increased pain;Patient limited by fatigue    Behavior During Therapy  [facility] for tasks assessed/performed       Past Medical History:  Diagnosis Date  . Allergy   . Anemia   . Arthritis    rheumatoid /knees/hands/shoulders (Right shoulder) infusion every 5 weeks  . Bladder incontinence   . Chicken pox   . Dyspnea    with exertion or far walking  . Gastric ulcer   . GERD (gastroesophageal reflux disease)   . Heart murmur   . Hypercholesteremia   . Hyperlipidemia, unspecified   . Hypertension    controlled with meds;   Marland Kitchen MDS (myelodysplastic syndrome) ([place]) 2000  . Memory deficit   . Muscular dystrophy ([place])    per patient, this is incorrect  . Myelodysplastic syndrome ([place])   . Myelodysplastic syndrome ([place])   . Rheumatoid arthritis ([place])   . Ulcer     Past Surgical History:  Procedure Laterality Date  . ABDOMINAL HYSTERECTOMY    . APPENDECTOMY     as a child  . CHOLECYSTECTOMY    . COLONOSCOPY    . COLONOSCOPY WITH PROPOFOL N/A 04/24/2017   Procedure: COLONOSCOPY WITH PROPOFOL;  Surgeon: Lollie Sails, MD;  Location: [facility]COPY;  Service: Endoscopy;  Laterality: N/A;  . ESOPHAGOGASTRODUODENOSCOPY (EGD) WITH PROPOFOL N/A 04/24/2017   Procedure:  ESOPHAGOGASTRODUODENOSCOPY (EGD) WITH PROPOFOL;  Surgeon: Lollie Sails, MD;  Location: [facility] ENDOSCOPY;  Service: Endoscopy;  Laterality: N/A;  . JOINT REPLACEMENT    . KNEE ARTHROPLASTY Right 02/21/2017   Procedure: COMPUTER ASSISTED TOTAL KNEE ARTHROPLASTY;  Surgeon: Dereck Leep, MD;  Location: [facility] ORS;  Service: Orthopedics;  Laterality: Right;  . KNEE ARTHROPLASTY Left 08/06/2017   Procedure: COMPUTER ASSISTED TOTAL KNEE ARTHROPLASTY;  Surgeon: Dereck Leep, MD;  Location: [facility] ORS;  Service: Orthopedics;  Laterality: Left;  . NM INTRAVENOUS INJECTION ([facility] HX)     receives biologic infusions every 5 weeks for rheumatoid arthritis. followed by dr. Meda Coffee at [facility]  . TONSILLECTOMY    . UPPER GASTROINTESTINAL ENDOSCOPY    . WISDOM TOOTH EXTRACTION      There were no vitals filed for this visit.  Subjective Assessment - 10/08/18 1306    Subjective  Doing ok. L knee is not much better. No back pain currently. Not painful L knee but has some tingles. Comes and goes. 6-7/10 L lateral leg pain currently.    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 Pt had L TKA 08/05/2017. Last 3-4 months pt has been having pain L lateral leg. Pt also states feeling pain in her upper back  including her B shoulders. Mid  back pain travels down her spine to her low back and feel radiating pain down her L lateral leg. R LE seems fine.   Denies loss of bowel or bladder control, or saddle anesthesia.  No falls within the last 6 months.  Low back and L lateral leg pain seems to have increased since onset.     Currently in Pain?  Yes    Pain Score  7    L lateral leg                              PT Education - 10/08/18 1310    Education provided  Yes    Education Details  ther-ex    [employer]) Educated  Patient    Methods  Explanation;Demonstration;Tactile cues;Verbal cues    Comprehension  Returned demonstration;Verbalized understanding       Objective  MedbridgeAccess Code:Access Code: [email or web address]  TTP R greater trochanter and glute med muscle   Therapeutic exercise   Seated hip adduction ball and glute max squeeze 10x5 seconds for 3 sets  Seated L hip extension isometrics 10x5 seconds for 3 sets  Sit <> Stand from regular chair with arms 5 x (no hands to sit)  Standing L hip abduction with B UE assist 6x to promote glute med muscle strengthening.   R lateral hip discomfort  Seated clamshell isometrics, hips less than 90 degrees flexion 10x5 seconds for 3 sets   Side stepping with B UE assist from treadmill bar 5 ft to the R and 5 ft to the L 4x CGA  Static standing with eyes closed, no UE assist 10 seconds 5x  Forward step up onto Air Ex pad with L LE and R UE assist 4x. R hip discomfort around greater trochanter and glute med muscle area.   Seated clamshell isometrics, hips less than 90 degrees flexion, 40% effort for 1 minute with 1 minute rest breaks 4x   Pt and pt's husband/careviger were recommended to contact her doctor pertaining to her L knee and hips to make sure everything is ok from her fall about a week ago. Pt husband/caregiver verbalized understanding.     Improved exercise technique, movement at target joints, use of target muscles after mod verbal, visual, tactile cues.        Pt response to treatment Pt tolerated session well without aggravation of back, L knee or leg pain. Slight R hip discomfort around glute med and greater trochanter area with standing exercises. Eases with sitting. Pt states back, L knee and leg ok after session    Clinical impression Continued working on LE strengthening to promote lumbopelvic control during gait to help decrease low back and L LE pain as well as strength to support herself when walking and performing standing tasks to help decrease fall risk. Low back and L knee pain doing well based on subjective reports past 2 sessions.  Pt will  benefit from continued skilled physical therapy services to decrease pain, improve strength, function, and decrease fall risk.    PT Short Term Goals - 08/08/18 1826      PT SHORT TERM GOAL #1   Title  Patient will be independent with her HEP to improve strength, decrease pain, improve function    Time  3    Period  Weeks    Status  On-going    Target Date  05/23/18        PT Long Term Goals -  10/02/18 1723      PT LONG TERM GOAL #1   Title  Patient will have a decrease in mid to low back pain to 5/10 or less at worst to promote ability to ambulate more comfortably and tolerate standing longer.     Baseline  8/10 back pain at worst for the past 2 months (possible greater than 8/10 at worst based on pt reports but pt seems to have difficulty with the pain scale; 04/30/2018); 5-6/10 mid to low back pain at most for the past month (08/08/2018); 7/10 back pain at worst for the past 7 days (09/24/2018)    Time  6    Period  Weeks    Status  On-going    Target Date  11/07/18      PT LONG TERM GOAL #2   Title  Pt will have a decrease in L lateral leg pain to 5/10 or less at worst  to promote ability to ambulate more comfortably and tolerate standing longer.     Baseline  10/10 at worst for the past 2 months (04/30/2018); 7-8/10 L lateral leg pain/tingling at worst for the past month (08/08/2018). 7/10 tingling at most for the past 7 days (09/24/2018)    Time  6    Period  Weeks    Status  On-going    Target Date  11/07/18      PT LONG TERM GOAL #3   Title  Patient will have a decrease in L knee joint pain to 5/10 or less at worst  to promote ability to ambulate more comfortably and tolerate standing longer.     Baseline  10/10 L knee pain at worst for the past 2 months (04/30/2018); 8/10 at worst for the past month (08/08/2018); 7-8/10 L knee pain at worst for the past 7 days (09/24/2018)    Time  6    Period  Weeks    Status  On-going    Target Date  11/07/18      PT LONG TERM GOAL #4    Title  Pt will improve B LE strength by at least 1/2 MMT grade  to promote ability to ambulate more comfortably and tolerate standing longer.     Time  6    Period  Weeks    Status  Partially Met    Target Date  11/07/18      PT LONG TERM GOAL #5   Title  Pt will report being able to tolerate standing to 5 minutes or more to promote ability to stand at church as well as improve ability to perform chores.     Baseline  Pt able to tolerate standing 1 min per subjective reports. (04/30/2018); half a minute per pt (08/08/2018); 2 minutes 8 seconds with light touch assist (09/24/2018)    Time  6    Period  Weeks    Status  On-going    Target Date  11/07/18      Additional Long Term Goals   Additional Long Term Goals  Yes      PT LONG TERM GOAL #6   Title  Patient will improve her TUG time using SPC to 15 seconds or less as a demonstration of improved functional mobility and balance.    Baseline  26.3 seconds on average using SPC (09/30/2018)    Time  5    Period  Weeks    Status  New    Target Date  11/07/18      PT  LONG TERM GOAL #7   Title  Patient will improve her Merrilee Jansky Balance Test score to 40/56 or more as a demonstration of improved balance and decreased fall risk.    Baseline  33/56 (09/30/2018)    Time  5    Period  Weeks    Status  New    Target Date  11/07/18            Plan - 10/08/18 1310    Clinical Impression Statement  Continued working on LE strengthening to promote lumbopelvic control during gait to help decrease low back and L LE pain as well as strength to support herself when walking and performing standing tasks to help decrease fall risk. Low back and L knee pain doing well based on subjective reports past 2 sessions.  Pt will benefit from continued skilled physical therapy services to decrease pain, improve strength, function, and decrease fall risk.    Personal Factors and Comorbidities  Age;Comorbidity 3+;Fitness;Past/Current Experience;Time since onset of  injury/illness/exacerbation    Comorbidities  Memory deficit, rheumatoid arthritis, dyspnea, arthritis, B TKA, hx of hysterectomy, myelodysplastic syndrome, arthritis multiple areas    Examination-Activity Limitations  Locomotion Level;Carry;Stairs;Stand    Stability/Clinical Decision Making  Stable/Uncomplicated    Rehab Potential  Fair    Clinical Impairments Affecting Rehab Potential  sedentary lifestyle, age, medical history and chronicity of condition.    PT Frequency  2x / week    PT Duration  6 weeks    PT Treatment/Interventions  Aquatic Therapy;Cryotherapy;Moist Heat;Gait training;Stair training;Functional mobility training;Therapeutic activities;Therapeutic exercise;Patient/family education;Neuromuscular re-education;Balance training;Manual techniques;Taping;Energy conservation;Passive range of motion;Electrical Stimulation;Iontophoresis 57m/ml Dexamethasone;Dry needling    PT Next Visit Plan  Continue with core strengthening, hip strenghtening.    Consulted and Agree with Plan of Care  Patient       Patient will benefit from skilled therapeutic intervention in order to improve the following deficits and impairments:  Abnormal gait, Pain, Postural dysfunction, Decreased strength, Difficulty walking, Improper body mechanics, Decreased balance  Visit Diagnosis: 1. Pain in thoracic spine   2. Radiculopathy, lumbosacral region   3. Muscle weakness (generalized)   4. Difficulty in walking, not elsewhere classified   5. Chronic bilateral low back pain, unspecified whether sciatica present   6. Unsteadiness on feet   7. Chronic pain of left knee        Problem List Patient Active Problem List   Diagnosis Date Noted  . Protein-calorie malnutrition ([place] 08/27/2017  . 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 Cubital canal compression syndrome, right 04/18/2017  . Primary osteoarthritis of knee 03/25/2017  . 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 Pure  hypercholesterolemia 08/17/2016  . MDS (myelodysplastic syndrome) ([place] 05/12/2016  . B12 deficiency 05/04/2016  . Essential hypertension 05/04/2016  . Mixed stress and urge urinary incontinence 05/04/2016  . Rheumatoid arthritis involving multiple sites with positive rheumatoid factor ([place] 05/04/2016    MJoneen BoersPT, DPT   10/08/2018, 2:24 PM  [place] [address] [place] [address]Phone: [phone]  Fax:  [phone] Name: JTyffany WaldropMRN: [id number] of Birth: [birth year]

## 2018-10-15 ENCOUNTER — Other Ambulatory Visit: Payer: Self-pay

## 2018-10-15 ENCOUNTER — Ambulatory Visit: Payer: Medicare Other

## 2018-10-15 DIAGNOSIS — M25562 Pain in left knee: Secondary | ICD-10-CM

## 2018-10-15 DIAGNOSIS — R262 Difficulty in walking, not elsewhere classified: Secondary | ICD-10-CM

## 2018-10-15 DIAGNOSIS — M546 Pain in thoracic spine: Secondary | ICD-10-CM | POA: Diagnosis not present

## 2018-10-15 DIAGNOSIS — M545 Low back pain, unspecified: Secondary | ICD-10-CM

## 2018-10-15 DIAGNOSIS — M6281 Muscle weakness (generalized): Secondary | ICD-10-CM

## 2018-10-15 DIAGNOSIS — R2681 Unsteadiness on feet: Secondary | ICD-10-CM

## 2018-10-15 DIAGNOSIS — G8929 Other chronic pain: Secondary | ICD-10-CM

## 2018-10-15 DIAGNOSIS — M5417 Radiculopathy, lumbosacral region: Secondary | ICD-10-CM

## 2018-10-15 NOTE — Therapy (Signed)
[facility] [address] [place], [address] Phone: [phone]   Fax:  [phone]  Physical Therapy Treatment  Patient Details  Name: Sylvia Miller MRN: [id number] Date of Birth: Aug 12, 1940 Referring Provider (PT): Marlowe Sax, MD   Encounter Date: 10/15/2018  PT End of Session - 10/15/18 1303    Visit Number  22    Number of Visits  37    Date for PT Re-Evaluation  11/07/18    Authorization Type  2    Authorization Time Period  of 10 progress report (from 04/30/2018)    PT Start Time  1303    PT Stop Time  1344    PT Time Calculation (min)  41 min    Activity Tolerance  Patient tolerated treatment well;No increased pain;Patient limited by fatigue    Behavior During Therapy  [facility] for tasks assessed/performed       Past Medical History:  Diagnosis Date  . Allergy   . Anemia   . Arthritis    rheumatoid /knees/hands/shoulders (Right shoulder) infusion every 5 weeks  . Bladder incontinence   . Chicken pox   . Dyspnea    with exertion or far walking  . Gastric ulcer   . GERD (gastroesophageal reflux disease)   . Heart murmur   . Hypercholesteremia   . Hyperlipidemia, unspecified   . Hypertension    controlled with meds;   Marland Kitchen MDS (myelodysplastic syndrome) ([place]) 2000  . Memory deficit   . Muscular dystrophy ([place])    per patient, this is incorrect  . Myelodysplastic syndrome ([place])   . Myelodysplastic syndrome ([place])   . Rheumatoid arthritis ([place])   . Ulcer     Past Surgical History:  Procedure Laterality Date  . ABDOMINAL HYSTERECTOMY    . APPENDECTOMY     as a child  . CHOLECYSTECTOMY    . COLONOSCOPY    . COLONOSCOPY WITH PROPOFOL N/A 04/24/2017   Procedure: COLONOSCOPY WITH PROPOFOL;  Surgeon: Lollie Sails, MD;  Location: [facility] ENDOSCOPY;  Service: Endoscopy;  Laterality: N/A;  . ESOPHAGOGASTRODUODENOSCOPY (EGD) WITH PROPOFOL N/A 04/24/2017   Procedure:  ESOPHAGOGASTRODUODENOSCOPY (EGD) WITH PROPOFOL;  Surgeon: Lollie Sails, MD;  Location: [facility] ENDOSCOPY;  Service: Endoscopy;  Laterality: N/A;  . JOINT REPLACEMENT    . KNEE ARTHROPLASTY Right 02/21/2017   Procedure: COMPUTER ASSISTED TOTAL KNEE ARTHROPLASTY;  Surgeon: Dereck Leep, MD;  Location: [facility] ORS;  Service: Orthopedics;  Laterality: Right;  . KNEE ARTHROPLASTY Left 08/06/2017   Procedure: COMPUTER ASSISTED TOTAL KNEE ARTHROPLASTY;  Surgeon: Dereck Leep, MD;  Location: [facility] ORS;  Service: Orthopedics;  Laterality: Left;  . NM INTRAVENOUS INJECTION ([facility] HX)     receives biologic infusions every 5 weeks for rheumatoid arthritis. followed by dr. Meda Coffee at [facility]  . TONSILLECTOMY    . UPPER GASTROINTESTINAL ENDOSCOPY    . WISDOM TOOTH EXTRACTION      There were no vitals filed for this visit.  Subjective Assessment - 10/15/18 1305    Subjective  L knee hurts. 6-7/10 when walking. L lateral leg feels tingly.  Back is doing ok.    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 Pt had L TKA 08/05/2017. Last 3-4 months pt has been having pain L lateral leg. Pt also states feeling pain in her upper back  including her B shoulders. Mid back pain travels down her spine to her low back and feel radiating  pain down her L lateral leg. R LE seems fine.   Denies loss of bowel or bladder control, or saddle anesthesia.  No falls within the last 6 months.  Low back and L lateral leg pain seems to have increased since onset.     Currently in Pain?  Yes    Pain Score  7                                PT Education - 10/15/18 1307    Education provided  Yes    Education Details  ther-ex    Person(s) Educated  Patient    Methods  Explanation;Demonstration;Tactile cues;Verbal cues    Comprehension  Returned demonstration;Verbalized understanding      Objective  MedbridgeAccess Code:Access Code: [email or web address]  TTP R greater  trochanter and glute med muscle   Therapeutic exercise   Seated hip adduction ball and glute max squeeze 10x5 seconds for 3 sets  Seated L hip extension isometrics 10x5 seconds for 3 sets  Standing L hip abduction with B UE assist 10x2 to promote glute med muscle strengthening.            No R hip discomfort today after first set. R hip discomfort during second set   Seated manually resisted clamshell isometrics, hips less than 90 degrees flexion 10x5 seconds for 3 sets   Side stepping with B UE assist from treadmill bar 5 ft to the R and 5 ft to the L 4x CGA  Sit <> Stand from regular chair with arms 5 x 2 (no hands to sit)  Able to perform 3 sit <> stands during 2nd set without use of hands. Improving functional strength observed.    Improved exercise technique, movement at target joints, use of target muscles after mod verbal, visual, tactile cues.     Manual therapy  Seated STM L lateral hamstrings, vastus lateralis, IT band Seated STM L scar tissue to decrease stiffness   No L lateral leg tingling afterwards in sitting   Pt response to treatment Rest breaks provided secondary to fatigue. Pt tolerated session well without aggravation of symptoms.     Clinical impression Improved B LE strength observed with pt able to perform 3 sit <> stands towards end of session without UE assist. Continued working on LE strengthening to promote ability to support herself when standing and walking to decrease fall risk. Continued working on glute med and max strengthening as well as decreasing soft tissue tension L lateral thigh to promote better mechanics at her knee when ambulating. PT tolerated session well without aggravation of symptoms. Pt will benefit from continued skilled physical therapy services to decrease pain, improve strength, function, ability to ambulate, and decrease fall risk. Challenges to progress include chronicity of condition, fitness level, memory,  and sedentary lifestyle.     PT Short Term Goals - 08/08/18 1826      PT SHORT TERM GOAL #1   Title  Patient will be independent with her HEP to improve strength, decrease pain, improve function    Time  3    Period  Weeks    Status  On-going    Target Date  05/23/18        PT Long Term Goals - 10/02/18 1723      PT LONG TERM GOAL #1   Title  Patient will have a decrease in mid to low back pain to 5/10 or less  at worst to promote ability to ambulate more comfortably and tolerate standing longer.     Baseline  8/10 back pain at worst for the past 2 months (possible greater than 8/10 at worst based on pt reports but pt seems to have difficulty with the pain scale; 04/30/2018); 5-6/10 mid to low back pain at most for the past month (08/08/2018); 7/10 back pain at worst for the past 7 days (09/24/2018)    Time  6    Period  Weeks    Status  On-going    Target Date  11/07/18      PT LONG TERM GOAL #2   Title  Pt will have a decrease in L lateral leg pain to 5/10 or less at worst  to promote ability to ambulate more comfortably and tolerate standing longer.     Baseline  10/10 at worst for the past 2 months (04/30/2018); 7-8/10 L lateral leg pain/tingling at worst for the past month (08/08/2018). 7/10 tingling at most for the past 7 days (09/24/2018)    Time  6    Period  Weeks    Status  On-going    Target Date  11/07/18      PT LONG TERM GOAL #3   Title  Patient will have a decrease in L knee joint pain to 5/10 or less at worst  to promote ability to ambulate more comfortably and tolerate standing longer.     Baseline  10/10 L knee pain at worst for the past 2 months (04/30/2018); 8/10 at worst for the past month (08/08/2018); 7-8/10 L knee pain at worst for the past 7 days (09/24/2018)    Time  6    Period  Weeks    Status  On-going    Target Date  11/07/18      PT LONG TERM GOAL #4   Title  Pt will improve B LE strength by at least 1/2 MMT grade  to promote ability to ambulate more  comfortably and tolerate standing longer.     Time  6    Period  Weeks    Status  Partially Met    Target Date  11/07/18      PT LONG TERM GOAL #5   Title  Pt will report being able to tolerate standing to 5 minutes or more to promote ability to stand at church as well as improve ability to perform chores.     Baseline  Pt able to tolerate standing 1 min per subjective reports. (04/30/2018); half a minute per pt (08/08/2018); 2 minutes 8 seconds with light touch assist (09/24/2018)    Time  6    Period  Weeks    Status  On-going    Target Date  11/07/18      Additional Long Term Goals   Additional Long Term Goals  Yes      PT LONG TERM GOAL #6   Title  Patient will improve her TUG time using SPC to 15 seconds or less as a demonstration of improved functional mobility and balance.    Baseline  26.3 seconds on average using SPC (09/30/2018)    Time  5    Period  Weeks    Status  New    Target Date  11/07/18      PT LONG TERM GOAL #7   Title  Patient will improve her Berg Balance Test score to 40/56 or more as a demonstration of improved balance and decreased fall risk.  Baseline  33/56 (09/30/2018)    Time  5    Period  Weeks    Status  New    Target Date  11/07/18            Plan - 10/15/18 1308    Clinical Impression Statement  Improved B LE strength observed with pt able to perform 3 sit <> stands towards end of session without UE assist. Continued working on LE strengthening to promote ability to support herself when standing and walking to decrease fall risk. Continued working on glute med and max strengthening as well as decreasing soft tissue tension L lateral thigh to promote better mechanics at her knee when ambulating. PT tolerated session well without aggravation of symptoms. Pt will benefit from continued skilled physical therapy services to decrease pain, improve strength, function, ability to ambulate, and decrease fall risk. Challenges to progress include chronicity  of condition, fitness level, memory, and sedentary lifestyle.    Personal Factors and Comorbidities  Age;Comorbidity 3+;Fitness;Past/Current Experience;Time since onset of injury/illness/exacerbation    Comorbidities  Memory deficit, rheumatoid arthritis, dyspnea, arthritis, B TKA, hx of hysterectomy, myelodysplastic syndrome, arthritis multiple areas    Examination-Activity Limitations  Locomotion Level;Carry;Stairs;Stand    Stability/Clinical Decision Making  Stable/Uncomplicated    Rehab Potential  Fair    Clinical Impairments Affecting Rehab Potential  sedentary lifestyle, age, medical history and chronicity of condition.    PT Frequency  2x / week    PT Duration  6 weeks    PT Treatment/Interventions  Aquatic Therapy;Cryotherapy;Moist Heat;Gait training;Stair training;Functional mobility training;Therapeutic activities;Therapeutic exercise;Patient/family education;Neuromuscular re-education;Balance training;Manual techniques;Taping;Energy conservation;Passive range of motion;Electrical Stimulation;Iontophoresis 57m/ml Dexamethasone;Dry needling    PT Next Visit Plan  Continue with core strengthening, hip strenghtening.    Consulted and Agree with Plan of Care  Patient       Patient will benefit from skilled therapeutic intervention in order to improve the following deficits and impairments:  Abnormal gait, Pain, Postural dysfunction, Decreased strength, Difficulty walking, Improper body mechanics, Decreased balance  Visit Diagnosis: 1. Pain in thoracic spine   2. Radiculopathy, lumbosacral region   3. Muscle weakness (generalized)   4. Difficulty in walking, not elsewhere classified   5. Chronic pain of left knee   6. Unsteadiness on feet   7. Chronic bilateral low back pain, unspecified whether sciatica present        Problem List Patient Active Problem List   Diagnosis Date Noted  . Protein-calorie malnutrition ([place] 08/27/2017  . 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  Cubital canal compression syndrome, right 04/18/2017  . Primary osteoarthritis of knee 03/25/2017  . 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 Pure hypercholesterolemia 08/17/2016  . MDS (myelodysplastic syndrome) ([place] 05/12/2016  . B12 deficiency 05/04/2016  . Essential hypertension 05/04/2016  . Mixed stress and urge urinary incontinence 05/04/2016  . Rheumatoid arthritis involving multiple sites with positive rheumatoid factor ([place] 05/04/2016    MJoneen BoersPT, DPT   10/15/2018, 6:38 PM  [place] [facility] [address] [place] [id number]: [phone]  Fax:  [phone] Name: Sylvia ShankerMRN: [id number] of Birth: [birth year]

## 2018-10-17 ENCOUNTER — Other Ambulatory Visit: Payer: Self-pay

## 2018-10-17 ENCOUNTER — Ambulatory Visit: Payer: Medicare Other

## 2018-10-17 DIAGNOSIS — R2681 Unsteadiness on feet: Secondary | ICD-10-CM

## 2018-10-17 DIAGNOSIS — G8929 Other chronic pain: Secondary | ICD-10-CM

## 2018-10-17 DIAGNOSIS — M546 Pain in thoracic spine: Secondary | ICD-10-CM | POA: Diagnosis not present

## 2018-10-17 DIAGNOSIS — M25562 Pain in left knee: Secondary | ICD-10-CM

## 2018-10-17 DIAGNOSIS — R262 Difficulty in walking, not elsewhere classified: Secondary | ICD-10-CM

## 2018-10-17 DIAGNOSIS — M6281 Muscle weakness (generalized): Secondary | ICD-10-CM

## 2018-10-17 DIAGNOSIS — M5417 Radiculopathy, lumbosacral region: Secondary | ICD-10-CM

## 2018-10-17 NOTE — Therapy (Signed)
[place] [facility] [address], [place], [address] Phone: [phone]   Fax:  [phone]  Physical Therapy Treatment  Patient Details  Name: Sylvia Miller MRN: [id number] Date of Birth: 11/28/1940 Referring Provider (PT): Marlowe Sax, MD   Encounter Date: 10/17/2018  PT End of Session - 10/17/18 0905    Visit Number  23    Number of Visits  37    Date for PT Re-Evaluation  11/07/18    Authorization Type  3    Authorization Time Period  of 10 progress report (from 04/30/2018)    PT Start Time  0905    PT Stop Time  0946    PT Time Calculation (min)  41 min    Activity Tolerance  Patient tolerated treatment well;No increased pain;Patient limited by fatigue    Behavior During Therapy  [facility] for tasks assessed/performed       Past Medical History:  Diagnosis Date  . Allergy   . Anemia   . Arthritis    rheumatoid /knees/hands/shoulders (Right shoulder) infusion every 5 weeks  . Bladder incontinence   . Chicken pox   . Dyspnea    with exertion or far walking  . Gastric ulcer   . GERD (gastroesophageal reflux disease)   . Heart murmur   . Hypercholesteremia   . Hyperlipidemia, unspecified   . Hypertension    controlled with meds;   Marland Kitchen MDS (myelodysplastic syndrome) ([place]) 2000  . Memory deficit   . Muscular dystrophy ([place])    per patient, this is incorrect  . Myelodysplastic syndrome ([place])   . Myelodysplastic syndrome ([place])   . Rheumatoid arthritis ([place])   . Ulcer     Past Surgical History:  Procedure Laterality Date  . ABDOMINAL HYSTERECTOMY    . APPENDECTOMY     as a child  . CHOLECYSTECTOMY    . COLONOSCOPY    . COLONOSCOPY WITH PROPOFOL N/A 04/24/2017   Procedure: COLONOSCOPY WITH PROPOFOL;  Surgeon: Lollie Sails, MD;  Location: [facility] ENDOSCOPY;  Service: Endoscopy;  Laterality: N/A;  . ESOPHAGOGASTRODUODENOSCOPY (EGD) WITH PROPOFOL N/A 04/24/2017   Procedure:  ESOPHAGOGASTRODUODENOSCOPY (EGD) WITH PROPOFOL;  Surgeon: Lollie Sails, MD;  Location: [facility] ENDOSCOPY;  Service: Endoscopy;  Laterality: N/A;  . JOINT REPLACEMENT    . KNEE ARTHROPLASTY Right 02/21/2017   Procedure: COMPUTER ASSISTED TOTAL KNEE ARTHROPLASTY;  Surgeon: Dereck Leep, MD;  Location: [facility] ORS;  Service: Orthopedics;  Laterality: Right;  . KNEE ARTHROPLASTY Left 08/06/2017   Procedure: COMPUTER ASSISTED TOTAL KNEE ARTHROPLASTY;  Surgeon: Dereck Leep, MD;  Location: [facility] ORS;  Service: Orthopedics;  Laterality: Left;  . NM INTRAVENOUS INJECTION ([facility] HX)     receives biologic infusions every 5 weeks for rheumatoid arthritis. followed by dr. Meda Coffee at [facility]  . TONSILLECTOMY    . UPPER GASTROINTESTINAL ENDOSCOPY    . WISDOM TOOTH EXTRACTION      There were no vitals filed for this visit.  Subjective Assessment - 10/17/18 0907    Subjective  L knee still tingly L lateral leg is sore. Back does not seem to bother her.    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 Pt had L TKA 08/05/2017. Last 3-4 months pt has been having pain L lateral leg. Pt also states feeling pain in her upper back  including her B shoulders. Mid back pain travels down her spine to her low back and feel radiating  pain down her L lateral leg. R LE seems fine.   Denies loss of bowel or bladder control, or saddle anesthesia.  No falls within the last 6 months.  Low back and L lateral leg pain seems to have increased since onset.     Currently in Pain?  Yes    Pain Score  6                                PT Education - 10/17/18 0909    Education provided  Yes    Education Details  ther-ex    Person(s) Educated  Patient    Methods  Explanation;Demonstration;Tactile cues;Verbal cues    Comprehension  Returned demonstration;Verbalized understanding      Objective  MedbridgeAccess Code:Access Code: [email or web address]  TTP R greater trochanter and  glute med muscle   Therapeutic exercise   Seated hip adduction ball and glute max squeeze 10x5 seconds for 3 sets  Sit <> Stand from regular chair with arms5x 2 (occasional use of hands on chair)             Able to perform sit <> stands without use of hands. Improving functional strength observed.   Side stepping with B UE assist from treadmill bar 5 ft to the R and 5 ft to the L4x CGA to promote glute med strengthening   Forward step up onto Air Ex pad with L LE and R UE assist 10x   Seated manually resisted clamshell isometrics, hips less than 90 degrees flexion 10x5 seconds for 3 sets   SLS on L LE with B UE assist 3x5 seconds to promote R LE weight bearing strength   Seated L hip extension isometrics 10x5 seconds for 3 sets  Improved exercise technique, movement at target joints, use of target muscles after mod verbal, visual, tactile cues.      Manual therapy  Seated STM L lateral hamstrings, vastus lateralis, IT band    Pt response to treatment Rest breaks provided secondary to fatigue. Pt tolerated session well without aggravation of symptoms.No L knee pain or L lateral leg symptoims after session wth gait  Clinical impression  Worked on LE strengthening to promote ability to support herself when walking or performing standing tasks to help decrease fall risk. Also worked on L hip strength to promote better mechanics at her L knee and leg when performing closed chain activities. No L knee pain or L lateral leg symptoms after session with gait with SPC. Pt also seems to be improving back pain level of comfort based on subjective reports. Pt will benefit from continued skilled physical therapy services to decrease pain, improve strength, function, ability to ambulate and decrease fall risk.     PT Short Term Goals - 08/08/18 1826      PT SHORT TERM GOAL #1   Title  Patient will be independent with her HEP to improve strength, decrease pain,  improve function    Time  3    Period  Weeks    Status  On-going    Target Date  05/23/18        PT Long Term Goals - 10/02/18 1723      PT LONG TERM GOAL #1   Title  Patient will have a decrease in mid to low back pain to 5/10 or less at worst to promote ability to ambulate more comfortably and tolerate standing longer.  Baseline  8/10 back pain at worst for the past 2 months (possible greater than 8/10 at worst based on pt reports but pt seems to have difficulty with the pain scale; 04/30/2018); 5-6/10 mid to low back pain at most for the past month (08/08/2018); 7/10 back pain at worst for the past 7 days (09/24/2018)    Time  6    Period  Weeks    Status  On-going    Target Date  11/07/18      PT LONG TERM GOAL #2   Title  Pt will have a decrease in L lateral leg pain to 5/10 or less at worst  to promote ability to ambulate more comfortably and tolerate standing longer.     Baseline  10/10 at worst for the past 2 months (04/30/2018); 7-8/10 L lateral leg pain/tingling at worst for the past month (08/08/2018). 7/10 tingling at most for the past 7 days (09/24/2018)    Time  6    Period  Weeks    Status  On-going    Target Date  11/07/18      PT LONG TERM GOAL #3   Title  Patient will have a decrease in L knee joint pain to 5/10 or less at worst  to promote ability to ambulate more comfortably and tolerate standing longer.     Baseline  10/10 L knee pain at worst for the past 2 months (04/30/2018); 8/10 at worst for the past month (08/08/2018); 7-8/10 L knee pain at worst for the past 7 days (09/24/2018)    Time  6    Period  Weeks    Status  On-going    Target Date  11/07/18      PT LONG TERM GOAL #4   Title  Pt will improve B LE strength by at least 1/2 MMT grade  to promote ability to ambulate more comfortably and tolerate standing longer.     Time  6    Period  Weeks    Status  Partially Met    Target Date  11/07/18      PT LONG TERM GOAL #5   Title  Pt will report being able  to tolerate standing to 5 minutes or more to promote ability to stand at church as well as improve ability to perform chores.     Baseline  Pt able to tolerate standing 1 min per subjective reports. (04/30/2018); half a minute per pt (08/08/2018); 2 minutes 8 seconds with light touch assist (09/24/2018)    Time  6    Period  Weeks    Status  On-going    Target Date  11/07/18      Additional Long Term Goals   Additional Long Term Goals  Yes      PT LONG TERM GOAL #6   Title  Patient will improve her TUG time using SPC to 15 seconds or less as a demonstration of improved functional mobility and balance.    Baseline  26.3 seconds on average using SPC (09/30/2018)    Time  5    Period  Weeks    Status  New    Target Date  11/07/18      PT LONG TERM GOAL #7   Title  Patient will improve her Berg Balance Test score to 40/56 or more as a demonstration of improved balance and decreased fall risk.    Baseline  33/56 (09/30/2018)    Time  5    Period  Weeks    Status  New    Target Date  11/07/18            Plan - 10/17/18 0910    Clinical Impression Statement  Worked on LE strengthening to promote ability to support herself when walking or performing standing tasks to help decrease fall risk. Also worked on L hip strength to promote better mechanics at her L knee and leg when performing closed chain activities. No L knee pain or L lateral leg symptoms after session with gait with SPC. Pt also seems to be improving back pain level of comfort based on subjective reports. Pt will benefit from continued skilled physical therapy services to decrease pain, improve strength, function, ability to ambulate and decrease fall risk.    Personal Factors and Comorbidities  Age;Comorbidity 3+;Fitness;Past/Current Experience;Time since onset of injury/illness/exacerbation    Comorbidities  Memory deficit, rheumatoid arthritis, dyspnea, arthritis, B TKA, hx of hysterectomy, myelodysplastic syndrome, arthritis  multiple areas    Examination-Activity Limitations  Locomotion Level;Carry;Stairs;Stand    Stability/Clinical Decision Making  Stable/Uncomplicated    Rehab Potential  Fair    Clinical Impairments Affecting Rehab Potential  sedentary lifestyle, age, medical history and chronicity of condition.    PT Frequency  2x / week    PT Duration  6 weeks    PT Treatment/Interventions  Aquatic Therapy;Cryotherapy;Moist Heat;Gait training;Stair training;Functional mobility training;Therapeutic activities;Therapeutic exercise;Patient/family education;Neuromuscular re-education;Balance training;Manual techniques;Taping;Energy conservation;Passive range of motion;Electrical Stimulation;Iontophoresis 67m/ml Dexamethasone;Dry needling    PT Next Visit Plan  Continue with core strengthening, hip strenghtening.    Consulted and Agree with Plan of Care  Patient       Patient will benefit from skilled therapeutic intervention in order to improve the following deficits and impairments:  Abnormal gait, Pain, Postural dysfunction, Decreased strength, Difficulty walking, Improper body mechanics, Decreased balance  Visit Diagnosis: 1. Pain in thoracic spine   2. Radiculopathy, lumbosacral region   3. Muscle weakness (generalized)   4. Difficulty in walking, not elsewhere classified   5. Chronic pain of left knee   6. Unsteadiness on feet   7. Chronic bilateral low back pain, unspecified whether sciatica present        Problem List Patient Active Problem List   Diagnosis Date Noted  . Protein-calorie malnutrition ([place] 08/27/2017  . 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 Cubital canal compression syndrome, right 04/18/2017  . Primary osteoarthritis of knee 03/25/2017  . 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 Pure hypercholesterolemia 08/17/2016  . MDS (myelodysplastic syndrome) ([place] 05/12/2016  . B12 deficiency 05/04/2016  . Essential hypertension 05/04/2016  . Mixed stress and urge  urinary incontinence 05/04/2016  . Rheumatoid arthritis involving multiple sites with positive rheumatoid factor ([place] 05/04/2016    MJoneen BoersPT, DPT   10/17/2018, 11:07 AM  [facility] [address] [place] [address]Phone: [phone]  Fax:  [phone] Name: JLashaye FiskMRN: [id number] of Birth: 313-Jan-1942

## 2018-10-22 ENCOUNTER — Ambulatory Visit: Payer: Medicare Other

## 2018-10-22 ENCOUNTER — Other Ambulatory Visit: Payer: Self-pay

## 2018-10-22 DIAGNOSIS — R2681 Unsteadiness on feet: Secondary | ICD-10-CM

## 2018-10-22 DIAGNOSIS — M6281 Muscle weakness (generalized): Secondary | ICD-10-CM

## 2018-10-22 DIAGNOSIS — G8929 Other chronic pain: Secondary | ICD-10-CM

## 2018-10-22 DIAGNOSIS — M546 Pain in thoracic spine: Secondary | ICD-10-CM

## 2018-10-22 DIAGNOSIS — R262 Difficulty in walking, not elsewhere classified: Secondary | ICD-10-CM

## 2018-10-22 DIAGNOSIS — M5417 Radiculopathy, lumbosacral region: Secondary | ICD-10-CM

## 2018-10-22 NOTE — Therapy (Signed)
[name] [facility] [address], [place], [address] Phone: [phone]   Fax:  [phone]  Physical Therapy Treatment  Patient Details  Name: Sylvia Miller MRN: [id number] Date of Birth: 08/27/1940 Referring Provider (PT): Marlowe Sax, MD   Encounter Date: 10/22/2018  PT End of Session - 10/22/18 1301    Visit Number  24    Number of Visits  37    Date for PT Re-Evaluation  11/07/18    Authorization Type  4    Authorization Time Period  of 10 progress report (from 04/30/2018)    PT Start Time  1303    PT Stop Time  1343    PT Time Calculation (min)  40 min    Activity Tolerance  Patient tolerated treatment well;No increased pain;Patient limited by fatigue    Behavior During Therapy  [facility] for tasks assessed/performed       Past Medical History:  Diagnosis Date  . Allergy   . Anemia   . Arthritis    rheumatoid /knees/hands/shoulders (Right shoulder) infusion every 5 weeks  . Bladder incontinence   . Chicken pox   . Dyspnea    with exertion or far walking  . Gastric ulcer   . GERD (gastroesophageal reflux disease)   . Heart murmur   . Hypercholesteremia   . Hyperlipidemia, unspecified   . Hypertension    controlled with meds;   Marland Kitchen MDS (myelodysplastic syndrome) ([place]) 2000  . Memory deficit   . Muscular dystrophy ([place])    per patient, this is incorrect  . Myelodysplastic syndrome ([place])   . Myelodysplastic syndrome ([place])   . Rheumatoid arthritis ([place])   . Ulcer     Past Surgical History:  Procedure Laterality Date  . ABDOMINAL HYSTERECTOMY    . APPENDECTOMY     as a child  . CHOLECYSTECTOMY    . COLONOSCOPY    . COLONOSCOPY WITH PROPOFOL N/A 04/24/2017   Procedure: COLONOSCOPY WITH PROPOFOL;  Surgeon: Lollie Sails, MD;  Location: [facility] ENDOSCOPY;  Service: Endoscopy;  Laterality: N/A;  . ESOPHAGOGASTRODUODENOSCOPY (EGD) WITH PROPOFOL N/A 04/24/2017   Procedure:  ESOPHAGOGASTRODUODENOSCOPY (EGD) WITH PROPOFOL;  Surgeon: Lollie Sails, MD;  Location: [facility] ENDOSCOPY;  Service: Endoscopy;  Laterality: N/A;  . JOINT REPLACEMENT    . KNEE ARTHROPLASTY Right 02/21/2017   Procedure: COMPUTER ASSISTED TOTAL KNEE ARTHROPLASTY;  Surgeon: Dereck Leep, MD;  Location: [facility] ORS;  Service: Orthopedics;  Laterality: Right;  . KNEE ARTHROPLASTY Left 08/06/2017   Procedure: COMPUTER ASSISTED TOTAL KNEE ARTHROPLASTY;  Surgeon: Dereck Leep, MD;  Location: [facility] ORS;  Service: Orthopedics;  Laterality: Left;  . NM INTRAVENOUS INJECTION ([facility] HX)     receives biologic infusions every 5 weeks for rheumatoid arthritis. followed by dr. Meda Coffee at [facility]  . TONSILLECTOMY    . UPPER GASTROINTESTINAL ENDOSCOPY    . WISDOM TOOTH EXTRACTION      There were no vitals filed for this visit.  Subjective Assessment - 10/22/18 1305    Subjective  Pt husband states that pt falls rarely. They try to be careful. She fell 2x in the last year and 1x the year before. Back is not bothering her currently. No pain or tingling L leg currently. L knee is not bothering her currently and when walking from the bathroom to the treatment room.    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 Pt had L TKA 08/05/2017. Last  3-4 months pt has been having pain L lateral leg. Pt also states feeling pain in her upper back  including her B shoulders. Mid back pain travels down her spine to her low back and feel radiating pain down her L lateral leg. R LE seems fine.   Denies loss of bowel or bladder control, or saddle anesthesia.  No falls within the last 6 months.  Low back and L lateral leg pain seems to have increased since onset.     Currently in Pain?  No/denies                               PT Education - 10/22/18 1309    Education provided  Yes    Education Details  ther-ex    [employer]) Educated  Patient    Methods   Explanation;Demonstration;Tactile cues;Verbal cues    Comprehension  Verbalized understanding;Returned demonstration      Objective  MedbridgeAccess Code:Access Code: [email or web address]   Therapeutic exercise  Seated L hip extension isometrics 10x5 seconds for 3 sets  Seatedmanually resistedclamshell isometrics, hips less than 90 degrees flexion 10x5 seconds for 3 sets   Seated hip adduction ball and glute max squeeze 10x5 seconds for 3 sets  Standing hip abduction with B UE assist   L 10x2  R 10x2  Sit <> Stand from regular chair with arms5x to stand, no UE assist to sit.   Then no UE assist 5x  Side stepping with B UE assist from treadmill bar 5 ft to the R and 5 ft to the L2x SBA to promote glute med strengthening    Improved exercise technique, movement at target joints, use of target muscles after mod verbal, visual, tactile cues.     Manual therapy  Seated STM L lateral hamstrings, vastus lateralis, IT band    Pt response to treatment Rest breaks provided secondary to fatigue.Pt tolerated session well without aggravation of symptoms.  Clinical impression Continued working on glute med and max strengthening to promote proper mechanics at her low back and L knee while ambulating (decrease trunk lean, pelvic drop, and genu valgus), as well as continued working on overall LE strength to promote ability to support herself with her LE when performing standing tasks to help decrease fall risk. Pt tolerated session well without aggravation of symptoms. Therapeutic rest breaks provided secondary to fatigue and to allow her muscles to recover for her next exercise. Pt will benefit from continued skilled physical therapy services to decrease pain, improve strength and function.       PT Short Term Goals - 08/08/18 1826      PT SHORT TERM GOAL #1   Title  Patient will be independent with her HEP to improve strength, decrease pain, improve function     Time  3    Period  Weeks    Status  On-going    Target Date  05/23/18        PT Long Term Goals - 10/02/18 1723      PT LONG TERM GOAL #1   Title  Patient will have a decrease in mid to low back pain to 5/10 or less at worst to promote ability to ambulate more comfortably and tolerate standing longer.     Baseline  8/10 back pain at worst for the past 2 months (possible greater than 8/10 at worst based on pt reports but pt seems to have difficulty with the pain  scale; 04/30/2018); 5-6/10 mid to low back pain at most for the past month (08/08/2018); 7/10 back pain at worst for the past 7 days (09/24/2018)    Time  6    Period  Weeks    Status  On-going    Target Date  11/07/18      PT LONG TERM GOAL #2   Title  Pt will have a decrease in L lateral leg pain to 5/10 or less at worst  to promote ability to ambulate more comfortably and tolerate standing longer.     Baseline  10/10 at worst for the past 2 months (04/30/2018); 7-8/10 L lateral leg pain/tingling at worst for the past month (08/08/2018). 7/10 tingling at most for the past 7 days (09/24/2018)    Time  6    Period  Weeks    Status  On-going    Target Date  11/07/18      PT LONG TERM GOAL #3   Title  Patient will have a decrease in L knee joint pain to 5/10 or less at worst  to promote ability to ambulate more comfortably and tolerate standing longer.     Baseline  10/10 L knee pain at worst for the past 2 months (04/30/2018); 8/10 at worst for the past month (08/08/2018); 7-8/10 L knee pain at worst for the past 7 days (09/24/2018)    Time  6    Period  Weeks    Status  On-going    Target Date  11/07/18      PT LONG TERM GOAL #4   Title  Pt will improve B LE strength by at least 1/2 MMT grade  to promote ability to ambulate more comfortably and tolerate standing longer.     Time  6    Period  Weeks    Status  Partially Met    Target Date  11/07/18      PT LONG TERM GOAL #5   Title  Pt will report being able to tolerate standing  to 5 minutes or more to promote ability to stand at church as well as improve ability to perform chores.     Baseline  Pt able to tolerate standing 1 min per subjective reports. (04/30/2018); half a minute per pt (08/08/2018); 2 minutes 8 seconds with light touch assist (09/24/2018)    Time  6    Period  Weeks    Status  On-going    Target Date  11/07/18      Additional Long Term Goals   Additional Long Term Goals  Yes      PT LONG TERM GOAL #6   Title  Patient will improve her TUG time using SPC to 15 seconds or less as a demonstration of improved functional mobility and balance.    Baseline  26.3 seconds on average using SPC (09/30/2018)    Time  5    Period  Weeks    Status  New    Target Date  11/07/18      PT LONG TERM GOAL #7   Title  Patient will improve her Berg Balance Test score to 40/56 or more as a demonstration of improved balance and decreased fall risk.    Baseline  33/56 (09/30/2018)    Time  5    Period  Weeks    Status  New    Target Date  11/07/18            Plan - 10/22/18 1316  Clinical Impression Statement  Continued working on glute med and max strengthening to promote proper mechanics at her low back and L knee while ambulating (decrease trunk lean, pelvic drop, and genu valgus), as well as continued working on overall LE strength to promote ability to support herself with her LE when performing standing tasks to help decrease fall risk. Pt tolerated session well without aggravation of symptoms. Therapeutic rest breaks provided secondary to fatigue and to allow her muscles to recover for her next exercise. Pt will benefit from continued skilled physical therapy services to decrease pain, improve strength and function.    Personal Factors and Comorbidities  Age;Comorbidity 3+;Fitness;Past/Current Experience;Time since onset of injury/illness/exacerbation    Comorbidities  Memory deficit, rheumatoid arthritis, dyspnea, arthritis, B TKA, hx of hysterectomy,  myelodysplastic syndrome, arthritis multiple areas    Examination-Activity Limitations  Locomotion Level;Carry;Stairs;Stand    Stability/Clinical Decision Making  Stable/Uncomplicated    Rehab Potential  Fair    Clinical Impairments Affecting Rehab Potential  sedentary lifestyle, age, medical history and chronicity of condition.    PT Frequency  2x / week    PT Duration  6 weeks    PT Treatment/Interventions  Aquatic Therapy;Cryotherapy;Moist Heat;Gait training;Stair training;Functional mobility training;Therapeutic activities;Therapeutic exercise;Patient/family education;Neuromuscular re-education;Balance training;Manual techniques;Taping;Energy conservation;Passive range of motion;Electrical Stimulation;Iontophoresis 62m/ml Dexamethasone;Dry needling    PT Next Visit Plan  Continue with core strengthening, hip strenghtening.    Consulted and Agree with Plan of Care  Patient       Patient will benefit from skilled therapeutic intervention in order to improve the following deficits and impairments:  Abnormal gait, Pain, Postural dysfunction, Decreased strength, Difficulty walking, Improper body mechanics, Decreased balance  Visit Diagnosis: 1. Pain in thoracic spine   2. Radiculopathy, lumbosacral region   3. Muscle weakness (generalized)   4. Difficulty in walking, not elsewhere classified   5. Chronic pain of left knee   6. Unsteadiness on feet        Problem List Patient Active Problem List   Diagnosis Date Noted  . Protein-calorie malnutrition ([place] 08/27/2017  . 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 Cubital canal compression syndrome, right 04/18/2017  . Primary osteoarthritis of knee 03/25/2017  . 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 Pure hypercholesterolemia 08/17/2016  . MDS (myelodysplastic syndrome) ([place] 05/12/2016  . B12 deficiency 05/04/2016  . Essential hypertension 05/04/2016  . Mixed stress and urge urinary incontinence 05/04/2016  .  Rheumatoid arthritis involving multiple sites with positive rheumatoid factor ([place] 05/04/2016    MJoneen BoersPT, DPT   10/22/2018, 5:25 PM  [place] [facility] [address] [place] [address]Phone: [phone]  Fax:  [phone] Name: JAdwoa AxeMRN: [id number] of Birth: [birth year]

## 2018-10-24 ENCOUNTER — Other Ambulatory Visit: Payer: Self-pay

## 2018-10-24 ENCOUNTER — Ambulatory Visit: Payer: Medicare Other

## 2018-10-24 DIAGNOSIS — M6281 Muscle weakness (generalized): Secondary | ICD-10-CM

## 2018-10-24 DIAGNOSIS — G8929 Other chronic pain: Secondary | ICD-10-CM

## 2018-10-24 DIAGNOSIS — R2681 Unsteadiness on feet: Secondary | ICD-10-CM

## 2018-10-24 DIAGNOSIS — M25562 Pain in left knee: Secondary | ICD-10-CM

## 2018-10-24 DIAGNOSIS — M546 Pain in thoracic spine: Secondary | ICD-10-CM | POA: Diagnosis not present

## 2018-10-24 DIAGNOSIS — M5417 Radiculopathy, lumbosacral region: Secondary | ICD-10-CM

## 2018-10-24 DIAGNOSIS — R262 Difficulty in walking, not elsewhere classified: Secondary | ICD-10-CM

## 2018-10-24 NOTE — Therapy (Signed)
[facility] [address], [place], [address] Phone: [phone]   Fax:  [phone]  Physical Therapy Treatment  Patient Details  Name: Sylvia Miller MRN: [id number] Date of Birth: April 28, 1940 Referring Provider (PT): Marlowe Sax, MD   Encounter Date: 10/24/2018  PT End of Session - 10/24/18 0907    Visit Number  25    Number of Visits  37    Date for PT Re-Evaluation  11/07/18    Authorization Type  5    Authorization Time Period  of 10 progress report (from 04/30/2018)    PT Start Time  0907    PT Stop Time  0947    PT Time Calculation (min)  40 min    Activity Tolerance  Patient tolerated treatment well;No increased pain;Patient limited by fatigue    Behavior During Therapy  [facility] for tasks assessed/performed       Past Medical History:  Diagnosis Date  . Allergy   . Anemia   . Arthritis    rheumatoid /knees/hands/shoulders (Right shoulder) infusion every 5 weeks  . Bladder incontinence   . Chicken pox   . Dyspnea    with exertion or far walking  . Gastric ulcer   . GERD (gastroesophageal reflux disease)   . Heart murmur   . Hypercholesteremia   . Hyperlipidemia, unspecified   . Hypertension    controlled with meds;   Marland Kitchen MDS (myelodysplastic syndrome) ([place]) 2000  . Memory deficit   . Muscular dystrophy ([place])    per patient, this is incorrect  . Myelodysplastic syndrome ([place])   . Myelodysplastic syndrome ([place])   . Rheumatoid arthritis ([place])   . Ulcer     Past Surgical History:  Procedure Laterality Date  . ABDOMINAL HYSTERECTOMY    . APPENDECTOMY     as a child  . CHOLECYSTECTOMY    . COLONOSCOPY    . COLONOSCOPY WITH PROPOFOL N/A 04/24/2017   Procedure: COLONOSCOPY WITH PROPOFOL;  Surgeon: Lollie Sails, MD;  Location: [facility] ENDOSCOPY;  Service: Endoscopy;  Laterality: N/A;  . ESOPHAGOGASTRODUODENOSCOPY (EGD) WITH PROPOFOL N/A 04/24/2017   Procedure:  ESOPHAGOGASTRODUODENOSCOPY (EGD) WITH PROPOFOL;  Surgeon: Lollie Sails, MD;  Location: [facility] ENDOSCOPY;  Service: Endoscopy;  Laterality: N/A;  . JOINT REPLACEMENT    . KNEE ARTHROPLASTY Right 02/21/2017   Procedure: COMPUTER ASSISTED TOTAL KNEE ARTHROPLASTY;  Surgeon: Dereck Leep, MD;  Location: [facility] ORS;  Service: Orthopedics;  Laterality: Right;  . KNEE ARTHROPLASTY Left 08/06/2017   Procedure: COMPUTER ASSISTED TOTAL KNEE ARTHROPLASTY;  Surgeon: Dereck Leep, MD;  Location: [facility] ORS;  Service: Orthopedics;  Laterality: Left;  . NM INTRAVENOUS INJECTION ([facility] HX)     receives biologic infusions every 5 weeks for rheumatoid arthritis. followed by dr. Meda Coffee at [place] [facility]  . TONSILLECTOMY    . UPPER GASTROINTESTINAL ENDOSCOPY    . WISDOM TOOTH EXTRACTION      There were no vitals filed for this visit.  Subjective Assessment - 10/24/18 0912    Subjective  Pt states difficulty breathing with her mask on. 6/10 L knee pain while walking. Back is not bothering her currently.    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 Pt had L TKA 08/05/2017. Last 3-4 months pt has been having pain L lateral leg. Pt also states feeling pain in her upper back  including her B shoulders. Mid back pain travels down her spine to her low  back and feel radiating pain down her L lateral leg. R LE seems fine.   Denies loss of bowel or bladder control, or saddle anesthesia.  No falls within the last 6 months.  Low back and L lateral leg pain seems to have increased since onset.     Currently in Pain?  Yes    Pain Score  6     Pain Location  Knee                               PT Education - 10/24/18 0931    Education provided  Yes    Education Details  ther-ex    Person(s) Educated  Patient    Methods  Explanation;Demonstration;Tactile cues;Verbal cues    Comprehension  Returned demonstration;Verbalized understanding          Objective  MedbridgeAccess Code:Access Code: [email or web address]    Manual therapy  Seated STM L lateral hamstrings, vastus lateralis, IT band  Therapeutic exercise  94% SpO2 after gait from waiting room to treatment room, about 40 ft  97% room air after rest  (monitored oxygenation with pt movement/activtity to determine exercise appropriateness)   Clinic mask provided for pt (pt personal mask difficulty to breath with, pt states the clinic mask is better)     Seated L hip extension isometrics 10x5 seconds for 3 sets   Seatedmanually resistedclamshell isometrics, hips less than 90 degrees flexion 10x5 seconds for 3 sets   Seated hip adduction ball and glute max squeeze 10x5 seconds for 3 sets  Sit <> Stand from regular chair with arms5x to stand, no UE assist to sit.              Then no UE assist 5x  Lighter exercises secondary to fatigue  Improved exercise technique, movement at target joints, use of target muscles after mod verbal, visual, tactile cues.     Pt response to treatment Rest breaks provided secondary to fatigue. No complain of L knee pain during gait after session. Mainly L lateral leg tingling.    Clinical impression Continued working on decreasing lateral soft tissue tension to L patella, as well as glute med and max muscle strengthening to promote better mechanics at her L knee when ambulating. Provided clinic mask to replace pt personal mask secondary to pt reports difficulty breathing with her mask on. No cough observed. Pt reports slightly better breathing with clinic mask. SpO2 levels at room air within functional limits. Therapeutic rest breaks provided and light exercises performed today secondary to pt status. Good muscle use felt with exercises. No L knee pain reported with gait after session. Still felt L lateral leg paresthesia. Pt also overall demonstrates some progress with back pain secondary to consistent reports of her back  not bothering her. Pt will benefit from continued skilled physical therapy services to decrease pain, improve strength, function, and ability to ambulate.    PT Short Term Goals - 08/08/18 1826      PT SHORT TERM GOAL #1   Title  Patient will be independent with her HEP to improve strength, decrease pain, improve function    Time  3    Period  Weeks    Status  On-going    Target Date  05/23/18        PT Long Term Goals - 10/02/18 1723      PT LONG TERM GOAL #1   Title  Patient will have a  decrease in mid to low back pain to 5/10 or less at worst to promote ability to ambulate more comfortably and tolerate standing longer.     Baseline  8/10 back pain at worst for the past 2 months (possible greater than 8/10 at worst based on pt reports but pt seems to have difficulty with the pain scale; 04/30/2018); 5-6/10 mid to low back pain at most for the past month (08/08/2018); 7/10 back pain at worst for the past 7 days (09/24/2018)    Time  6    Period  Weeks    Status  On-going    Target Date  11/07/18      PT LONG TERM GOAL #2   Title  Pt will have a decrease in L lateral leg pain to 5/10 or less at worst  to promote ability to ambulate more comfortably and tolerate standing longer.     Baseline  10/10 at worst for the past 2 months (04/30/2018); 7-8/10 L lateral leg pain/tingling at worst for the past month (08/08/2018). 7/10 tingling at most for the past 7 days (09/24/2018)    Time  6    Period  Weeks    Status  On-going    Target Date  11/07/18      PT LONG TERM GOAL #3   Title  Patient will have a decrease in L knee joint pain to 5/10 or less at worst  to promote ability to ambulate more comfortably and tolerate standing longer.     Baseline  10/10 L knee pain at worst for the past 2 months (04/30/2018); 8/10 at worst for the past month (08/08/2018); 7-8/10 L knee pain at worst for the past 7 days (09/24/2018)    Time  6    Period  Weeks    Status  On-going    Target Date  11/07/18       PT LONG TERM GOAL #4   Title  Pt will improve B LE strength by at least 1/2 MMT grade  to promote ability to ambulate more comfortably and tolerate standing longer.     Time  6    Period  Weeks    Status  Partially Met    Target Date  11/07/18      PT LONG TERM GOAL #5   Title  Pt will report being able to tolerate standing to 5 minutes or more to promote ability to stand at church as well as improve ability to perform chores.     Baseline  Pt able to tolerate standing 1 min per subjective reports. (04/30/2018); half a minute per pt (08/08/2018); 2 minutes 8 seconds with light touch assist (09/24/2018)    Time  6    Period  Weeks    Status  On-going    Target Date  11/07/18      Additional Long Term Goals   Additional Long Term Goals  Yes      PT LONG TERM GOAL #6   Title  Patient will improve her TUG time using SPC to 15 seconds or less as a demonstration of improved functional mobility and balance.    Baseline  26.3 seconds on average using SPC (09/30/2018)    Time  5    Period  Weeks    Status  New    Target Date  11/07/18      PT LONG TERM GOAL #7   Title  Patient will improve her Berg Balance Test score to 40/56 or more  as a demonstration of improved balance and decreased fall risk.    Baseline  33/56 (09/30/2018)    Time  5    Period  Weeks    Status  New    Target Date  11/07/18            Plan - 10/24/18 0932    Clinical Impression Statement  Continued working on decreasing lateral soft tissue tension to L patella, as well as glute med and max muscle strengthening to promote better mechanics at her L knee when ambulating. Provided clinic mask to replace pt personal mask secondary to pt reports difficulty breathing with her mask on. No cough observed. Pt reports slightly better breathing with clinic mask. SpO2 levels at room air within functional limits. Therapeutic rest breaks provided and light exercises performed today secondary to pt status. Good muscle use felt with  exercises. No L knee pain reported with gait after session. Still felt L lateral leg paresthesia. Pt also overall demonstrates some progress with back pain secondary to consistent reports of her back not bothering her. Pt will benefit from continued skilled physical therapy services to decrease pain, improve strength, function, and ability to ambulate.    Personal Factors and Comorbidities  Age;Comorbidity 3+;Fitness;Past/Current Experience;Time since onset of injury/illness/exacerbation    Comorbidities  Memory deficit, rheumatoid arthritis, dyspnea, arthritis, B TKA, hx of hysterectomy, myelodysplastic syndrome, arthritis multiple areas    Examination-Activity Limitations  Locomotion Level;Carry;Stairs;Stand    Stability/Clinical Decision Making  Stable/Uncomplicated    Rehab Potential  Fair    Clinical Impairments Affecting Rehab Potential  sedentary lifestyle, age, medical history and chronicity of condition.    PT Frequency  2x / week    PT Duration  6 weeks    PT Treatment/Interventions  Aquatic Therapy;Cryotherapy;Moist Heat;Gait training;Stair training;Functional mobility training;Therapeutic activities;Therapeutic exercise;Patient/family education;Neuromuscular re-education;Balance training;Manual techniques;Taping;Energy conservation;Passive range of motion;Electrical Stimulation;Iontophoresis 30m/ml Dexamethasone;Dry needling    PT Next Visit Plan  Continue with core strengthening, hip strenghtening.    Consulted and Agree with Plan of Care  Patient       Patient will benefit from skilled therapeutic intervention in order to improve the following deficits and impairments:  Abnormal gait, Pain, Postural dysfunction, Decreased strength, Difficulty walking, Improper body mechanics, Decreased balance  Visit Diagnosis: 1. Pain in thoracic spine   2. Radiculopathy, lumbosacral region   3. Muscle weakness (generalized)   4. Difficulty in walking, not elsewhere classified   5. Chronic pain  of left knee   6. Unsteadiness on feet   7. Chronic bilateral low back pain, unspecified whether sciatica present        Problem List Patient Active Problem List   Diagnosis Date Noted  . Protein-calorie malnutrition ([place] 08/27/2017  . 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 Cubital canal compression syndrome, right 04/18/2017  . Primary osteoarthritis of knee 03/25/2017  . 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 Pure hypercholesterolemia 08/17/2016  . MDS (myelodysplastic syndrome) ([place] 05/12/2016  . B12 deficiency 05/04/2016  . Essential hypertension 05/04/2016  . Mixed stress and urge urinary incontinence 05/04/2016  . Rheumatoid arthritis involving multiple sites with positive rheumatoid factor ([place] 05/04/2016    MJoneen BoersPT, DPT   10/24/2018, 3:53 PM  [facility] [address] [place] [address]Phone: [phone]  Fax:  [phone] Name: JZanaria MorellMRN: [id number] of Birth: 324-Nov-1942

## 2018-10-29 ENCOUNTER — Other Ambulatory Visit: Payer: Self-pay

## 2018-10-29 ENCOUNTER — Ambulatory Visit: Payer: Medicare Other

## 2018-10-29 DIAGNOSIS — R262 Difficulty in walking, not elsewhere classified: Secondary | ICD-10-CM

## 2018-10-29 DIAGNOSIS — M546 Pain in thoracic spine: Secondary | ICD-10-CM

## 2018-10-29 DIAGNOSIS — M5417 Radiculopathy, lumbosacral region: Secondary | ICD-10-CM

## 2018-10-29 DIAGNOSIS — G8929 Other chronic pain: Secondary | ICD-10-CM

## 2018-10-29 DIAGNOSIS — M6281 Muscle weakness (generalized): Secondary | ICD-10-CM

## 2018-10-29 DIAGNOSIS — R2681 Unsteadiness on feet: Secondary | ICD-10-CM

## 2018-10-29 NOTE — Therapy (Signed)
[facility] [address] [place], [address] Phone: [phone]   Fax:  [phone]  Physical Therapy Treatment  Patient Details  Name: Sylvia Miller MRN: [id number] Date of Birth: 1940-08-31 Referring Provider (PT): Marlowe Sax, MD   Encounter Date: 10/29/2018  PT End of Session - 10/29/18 1259    Visit Number  26    Number of Visits  37    Date for PT Re-Evaluation  11/07/18    Authorization Type  6    Authorization Time Period  of 10 progress report (from 04/30/2018)    PT Start Time  1302    PT Stop Time  1344    PT Time Calculation (min)  42 min    Activity Tolerance  Patient tolerated treatment well;No increased pain;Patient limited by fatigue    Behavior During Therapy  [facility] for tasks assessed/performed       Past Medical History:  Diagnosis Date  . Allergy   . Anemia   . Arthritis    rheumatoid /knees/hands/shoulders (Right shoulder) infusion every 5 weeks  . Bladder incontinence   . Chicken pox   . Dyspnea    with exertion or far walking  . Gastric ulcer   . GERD (gastroesophageal reflux disease)   . Heart murmur   . Hypercholesteremia   . Hyperlipidemia, unspecified   . Hypertension    controlled with meds;   Marland Kitchen MDS (myelodysplastic syndrome) ([place]) 2000  . Memory deficit   . Muscular dystrophy ([place])    per patient, this is incorrect  . Myelodysplastic syndrome ([place])   . Myelodysplastic syndrome ([place])   . Rheumatoid arthritis ([place])   . Ulcer     Past Surgical History:  Procedure Laterality Date  . ABDOMINAL HYSTERECTOMY    . APPENDECTOMY     as a child  . CHOLECYSTECTOMY    . COLONOSCOPY    . COLONOSCOPY WITH PROPOFOL N/A 04/24/2017   Procedure: COLONOSCOPY WITH PROPOFOL;  Surgeon: Lollie Sails, MD;  Location: [facility] ENDOSCOPY;  Service: Endoscopy;  Laterality: N/A;  . ESOPHAGOGASTRODUODENOSCOPY (EGD) WITH PROPOFOL N/A 04/24/2017   Procedure:  ESOPHAGOGASTRODUODENOSCOPY (EGD) WITH PROPOFOL;  Surgeon: Lollie Sails, MD;  Location: [facility] ENDOSCOPY;  Service: Endoscopy;  Laterality: N/A;  . JOINT REPLACEMENT    . KNEE ARTHROPLASTY Right 02/21/2017   Procedure: COMPUTER ASSISTED TOTAL KNEE ARTHROPLASTY;  Surgeon: Dereck Leep, MD;  Location: [facility] ORS;  Service: Orthopedics;  Laterality: Right;  . KNEE ARTHROPLASTY Left 08/06/2017   Procedure: COMPUTER ASSISTED TOTAL KNEE ARTHROPLASTY;  Surgeon: Dereck Leep, MD;  Location: [facility] ORS;  Service: Orthopedics;  Laterality: Left;  . NM INTRAVENOUS INJECTION ([facility] HX)     receives biologic infusions every 5 weeks for rheumatoid arthritis. followed by dr. Meda Coffee at [place] [facility]  . TONSILLECTOMY    . UPPER GASTROINTESTINAL ENDOSCOPY    . WISDOM TOOTH EXTRACTION      There were no vitals filed for this visit.  Subjective Assessment - 10/29/18 1304    Subjective  Patient reported that shes "just trying to keep going". Cites fatigue. Has not been up much today, unable to tell if her back currently hurts.    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 Pt had L TKA 08/05/2017. Last 3-4 months pt has been having pain L lateral leg. Pt also states feeling pain in her upper back  including her B shoulders. Mid back pain travels down  her spine to her low back and feel radiating pain down her L lateral leg. R LE seems fine.   Denies loss of bowel or bladder control, or saddle anesthesia.  No falls within the last 6 months.  Low back and L lateral leg pain seems to have increased since onset.     Patient Stated Goals  Walk more comfortably. Stand longer than a minute at church.    Currently in Pain?  Yes    Pain Score  6     Pain Location  Knee    Pain Orientation  Left    Pain Descriptors / Indicators  Constant;Tender    Pain Type  Chronic pain    Pain Onset  More than a month ago         Medbridge Access Code: Access Code: [email or web address]      TREATMENT    Manual  therapy   Seated STM L lateral hamstrings, vastus lateralis, IT band    Therapeutic exercise       Seated L hip extension isometrics 10x5 seconds for 3 sets     Seated manually resisted clamshell isometrics, hips less than 90 degrees flexion 10x5 seconds for 3 sets    Seated hip adduction ball and glute max squeeze 10x5 seconds for 3 sets   Sit <> Stand from regular chair with arms, no UE support x5, supervision   Standing hip abduction with B UE assist              L 10x2             R 10x2   Improved exercise technique, movement at target joints, use of target muscles after mod verbal, visual, tactile cues.   Pt response/clinical impression: Pt easily distracted this session, often needed cues to re-direct and maintain exercise form, technique and count. Easily short of breath with ambulation and exertion this session, but performed all exercises with motivation from PT. The patient would benefit from further skilled PT intervention to continue to progress towards goals.        PT Education - 10/29/18 1259    Education provided  Yes    Education Details  ther-ex    [employer]) Educated  Patient    Methods  Explanation;Tactile cues;Verbal cues;Demonstration    Comprehension  Verbalized understanding;Returned demonstration       PT Short Term Goals - 08/08/18 1826      PT SHORT TERM GOAL #1   Title  Patient will be independent with her HEP to improve strength, decrease pain, improve function    Time  3    Period  Weeks    Status  On-going    Target Date  05/23/18        PT Long Term Goals - 10/02/18 1723      PT LONG TERM GOAL #1   Title  Patient will have a decrease in mid to low back pain to 5/10 or less at worst to promote ability to ambulate more comfortably and tolerate standing longer.     Baseline  8/10 back pain at worst for the past 2 months (possible greater than 8/10 at worst based on pt reports but pt seems to have difficulty with the pain scale;  04/30/2018); 5-6/10 mid to low back pain at most for the past month (08/08/2018); 7/10 back pain at worst for the past 7 days (09/24/2018)    Time  6    Period  Weeks    Status  On-going    Target Date  11/07/18      PT LONG TERM GOAL #2   Title  Pt will have a decrease in L lateral leg pain to 5/10 or less at worst  to promote ability to ambulate more comfortably and tolerate standing longer.     Baseline  10/10 at worst for the past 2 months (04/30/2018); 7-8/10 L lateral leg pain/tingling at worst for the past month (08/08/2018). 7/10 tingling at most for the past 7 days (09/24/2018)    Time  6    Period  Weeks    Status  On-going    Target Date  11/07/18      PT LONG TERM GOAL #3   Title  Patient will have a decrease in L knee joint pain to 5/10 or less at worst  to promote ability to ambulate more comfortably and tolerate standing longer.     Baseline  10/10 L knee pain at worst for the past 2 months (04/30/2018); 8/10 at worst for the past month (08/08/2018); 7-8/10 L knee pain at worst for the past 7 days (09/24/2018)    Time  6    Period  Weeks    Status  On-going    Target Date  11/07/18      PT LONG TERM GOAL #4   Title  Pt will improve B LE strength by at least 1/2 MMT grade  to promote ability to ambulate more comfortably and tolerate standing longer.     Time  6    Period  Weeks    Status  Partially Met    Target Date  11/07/18      PT LONG TERM GOAL #5   Title  Pt will report being able to tolerate standing to 5 minutes or more to promote ability to stand at church as well as improve ability to perform chores.     Baseline  Pt able to tolerate standing 1 min per subjective reports. (04/30/2018); half a minute per pt (08/08/2018); 2 minutes 8 seconds with light touch assist (09/24/2018)    Time  6    Period  Weeks    Status  On-going    Target Date  11/07/18      Additional Long Term Goals   Additional Long Term Goals  Yes      PT LONG TERM GOAL #6   Title  Patient will improve  her TUG time using SPC to 15 seconds or less as a demonstration of improved functional mobility and balance.    Baseline  26.3 seconds on average using SPC (09/30/2018)    Time  5    Period  Weeks    Status  New    Target Date  11/07/18      PT LONG TERM GOAL #7   Title  Patient will improve her Berg Balance Test score to 40/56 or more as a demonstration of improved balance and decreased fall risk.    Baseline  33/56 (09/30/2018)    Time  5    Period  Weeks    Status  New    Target Date  11/07/18            Plan - 10/29/18 1342    Clinical Impression Statement  Pt easily distracted this session, often needed cues to re-direct and maintain exercise form, technique and count. Easily short of breath with ambulation and exertion this session, but performed all exercises with motivation from PT. The patient would  benefit from further skilled PT intervention to continue to progress towards goals.    Personal Factors and Comorbidities  Age;Comorbidity 3+;Fitness;Past/Current Experience;Time since onset of injury/illness/exacerbation    Comorbidities  Memory deficit, rheumatoid arthritis, dyspnea, arthritis, B TKA, hx of hysterectomy, myelodysplastic syndrome, arthritis multiple areas    Examination-Activity Limitations  Locomotion Level;Carry;Stairs;Stand    Stability/Clinical Decision Making  Stable/Uncomplicated    Rehab Potential  Fair    Clinical Impairments Affecting Rehab Potential  sedentary lifestyle, age, medical history and chronicity of condition.    PT Frequency  2x / week    PT Duration  6 weeks    PT Treatment/Interventions  Aquatic Therapy;Cryotherapy;Moist Heat;Gait training;Stair training;Functional mobility training;Therapeutic activities;Therapeutic exercise;Patient/family education;Neuromuscular re-education;Balance training;Manual techniques;Taping;Energy conservation;Passive range of motion;Electrical Stimulation;Iontophoresis 94m/ml Dexamethasone;Dry needling    PT Next  Visit Plan  Continue with core strengthening, hip strenghtening.    PT Home Exercise Plan  MEast BerlinAccess code: [email or web address]   Consulted and Agree with Plan of Care  Patient    Family Member Consulted  husband       Patient will benefit from skilled therapeutic intervention in order to improve the following deficits and impairments:  Abnormal gait, Pain, Postural dysfunction, Decreased strength, Difficulty walking, Improper body mechanics, Decreased balance  Visit Diagnosis: Pain in thoracic spine  Radiculopathy, lumbosacral region  Muscle weakness (generalized)  Difficulty in walking, not elsewhere classified  Chronic pain of left knee  Unsteadiness on feet  Chronic bilateral low back pain, unspecified whether sciatica present     Problem List Patient Active Problem List   Diagnosis Date Noted  . Protein-calorie malnutrition ([facility] 08/27/2017  . 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 Cubital canal compression syndrome, right 04/18/2017  . Primary osteoarthritis of knee 03/25/2017  . 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 Pure hypercholesterolemia 08/17/2016  . MDS (myelodysplastic syndrome) (HS[place] 05/12/2016  . B12 deficiency 05/04/2016  . Essential hypertension 05/04/2016  . Mixed stress and urge urinary incontinence 05/04/2016  . Rheumatoid arthritis involving multiple sites with positive rheumatoid factor ([place] 05/04/2016    DLieutenant DiegoPT, DPT 1:47 PM,10/29/18 [phone] [facility] [address] [place] [address]Phone: [phone]  Fax:  [phone] Name: JKatricia PrehnMRN: [id number] of Birth: 3March 15, 1942

## 2018-10-31 ENCOUNTER — Other Ambulatory Visit: Payer: Self-pay

## 2018-10-31 ENCOUNTER — Ambulatory Visit: Payer: Medicare Other

## 2018-10-31 DIAGNOSIS — R2681 Unsteadiness on feet: Secondary | ICD-10-CM

## 2018-10-31 DIAGNOSIS — M5417 Radiculopathy, lumbosacral region: Secondary | ICD-10-CM

## 2018-10-31 DIAGNOSIS — M25562 Pain in left knee: Secondary | ICD-10-CM

## 2018-10-31 DIAGNOSIS — M546 Pain in thoracic spine: Secondary | ICD-10-CM

## 2018-10-31 DIAGNOSIS — M6281 Muscle weakness (generalized): Secondary | ICD-10-CM

## 2018-10-31 DIAGNOSIS — G8929 Other chronic pain: Secondary | ICD-10-CM

## 2018-10-31 DIAGNOSIS — R262 Difficulty in walking, not elsewhere classified: Secondary | ICD-10-CM

## 2018-10-31 NOTE — Therapy (Signed)
[place] [facility] [address], [place], [address] Phone: [phone]   Fax:  [phone]  Physical Therapy Treatment  Patient Details  Name: Sylvia Miller MRN: [id number] Date of Birth: September 09, 1940 Referring Provider (PT): Marlowe Sax, MD   Encounter Date: 10/31/2018  PT End of Session - 10/31/18 0951    Visit Number  27    Number of Visits  37    Date for PT Re-Evaluation  11/07/18    Authorization Type  7    Authorization Time Period  of 10 progress report (from 04/30/2018)    PT Start Time  0957   pt arrived late   PT Stop Time  1028    PT Time Calculation (min)  31 min    Activity Tolerance  Patient tolerated treatment well;No increased pain;Patient limited by fatigue    Behavior During Therapy  [facility] for tasks assessed/performed       Past Medical History:  Diagnosis Date  . Allergy   . Anemia   . Arthritis    rheumatoid /knees/hands/shoulders (Right shoulder) infusion every 5 weeks  . Bladder incontinence   . Chicken pox   . Dyspnea    with exertion or far walking  . Gastric ulcer   . GERD (gastroesophageal reflux disease)   . Heart murmur   . Hypercholesteremia   . Hyperlipidemia, unspecified   . Hypertension    controlled with meds;   Marland Kitchen MDS (myelodysplastic syndrome) ([place]) 2000  . Memory deficit   . Muscular dystrophy ([place])    per patient, this is incorrect  . Myelodysplastic syndrome ([place])   . Myelodysplastic syndrome ([place])   . Rheumatoid arthritis ([place])   . Ulcer     Past Surgical History:  Procedure Laterality Date  . ABDOMINAL HYSTERECTOMY    . APPENDECTOMY     as a child  . CHOLECYSTECTOMY    . COLONOSCOPY    . COLONOSCOPY WITH PROPOFOL N/A 04/24/2017   Procedure: COLONOSCOPY WITH PROPOFOL;  Surgeon: Lollie Sails, MD;  Location: [facility] ENDOSCOPY;  Service: Endoscopy;  Laterality: N/A;  . ESOPHAGOGASTRODUODENOSCOPY (EGD) WITH PROPOFOL N/A 04/24/2017   Procedure:  ESOPHAGOGASTRODUODENOSCOPY (EGD) WITH PROPOFOL;  Surgeon: Lollie Sails, MD;  Location: [facility] ENDOSCOPY;  Service: Endoscopy;  Laterality: N/A;  . JOINT REPLACEMENT    . KNEE ARTHROPLASTY Right 02/21/2017   Procedure: COMPUTER ASSISTED TOTAL KNEE ARTHROPLASTY;  Surgeon: Dereck Leep, MD;  Location: [facility] ORS;  Service: Orthopedics;  Laterality: Right;  . KNEE ARTHROPLASTY Left 08/06/2017   Procedure: COMPUTER ASSISTED TOTAL KNEE ARTHROPLASTY;  Surgeon: Dereck Leep, MD;  Location: [facility] ORS;  Service: Orthopedics;  Laterality: Left;  . NM INTRAVENOUS INJECTION ([facility] HX)     receives biologic infusions every 5 weeks for rheumatoid arthritis. followed by dr. Meda Coffee at [facility]  . TONSILLECTOMY    . UPPER GASTROINTESTINAL ENDOSCOPY    . WISDOM TOOTH EXTRACTION      There were no vitals filed for this visit.  Subjective Assessment - 10/31/18 0957    Subjective  L knee is still iffy. No back pain currently. 5-6/10 L lateral knee ache currently.  A little tingling L lateral leg.    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 Pt had L TKA 08/05/2017. Last 3-4 months pt has been having pain L lateral leg. Pt also states feeling pain in her upper back  including her B shoulders. Mid back pain travels  down her spine to her low back and feel radiating pain down her L lateral leg. R LE seems fine.   Denies loss of bowel or bladder control, or saddle anesthesia.  No falls within the last 6 months.  Low back and L lateral leg pain seems to have increased since onset.     Patient Stated Goals  Walk more comfortably. Stand longer than a minute at church.    Currently in Pain?  Yes    Pain Score  6     Pain Location  Knee    Pain Orientation  Left    Pain Onset  More than a month ago                               PT Education - 10/31/18 1012    Education provided  Yes    Education Details  ther-ex    [employer]) Educated  Patient    Methods   Explanation;Demonstration;Tactile cues;Verbal cues    Comprehension  Returned demonstration;Verbalized understanding         Objective  MedbridgeAccess Code:Access Code: [email or web address]     Therapeutic exercise   Seated L hip extension isometrics 10x5 seconds for 3 sets    Standing hip abduction with B UE assist   L 10x3  Seatedmanually resistedclamshell isometrics, hips less than 90 degrees flexion 10x5 seconds for 3 sets   Seated hip adduction ball and glute max squeeze 10x5 seconds for 3 sets  Forward step up onto Air Ex pad with B UE assist 10x   Improved exercise technique, movement at target joints, use of target muscles after mod verbal, visual, tactile cues.      Manual therapy  Seated STM L lateral hamstrings, vastus lateralis, IT band        Improved exercise technique, movement at target joints, use of target muscles after mod verbal, visual, tactile cues.     Pt response to treatment Rest breaks provided secondary to fatigue. No L knee pain during gait after session. Slight L lateral leg tingling.    Clinical impression Continued working on glute med, and max strengthening to promote lumbopelvic control and decrease femoral IR and adduction to decrease L lateral knee joint pain during gait. Continued working on decreasing L lateral tissue tension around L knee to decrease tension to L patella. No L knee joint pain with gait with SPC after session. Pt will benefit from continued skilled physical therapy services to decrease pain, improve strength and function.      PT Short Term Goals - 08/08/18 1826      PT SHORT TERM GOAL #1   Title  Patient will be independent with her HEP to improve strength, decrease pain, improve function    Time  3    Period  Weeks    Status  On-going    Target Date  05/23/18        PT Long Term Goals - 10/02/18 1723      PT LONG TERM GOAL #1   Title  Patient will have a decrease in mid to  low back pain to 5/10 or less at worst to promote ability to ambulate more comfortably and tolerate standing longer.     Baseline  8/10 back pain at worst for the past 2 months (possible greater than 8/10 at worst based on pt reports but pt seems to have difficulty with the pain scale; 04/30/2018); 5-6/10 mid to  low back pain at most for the past month (08/08/2018); 7/10 back pain at worst for the past 7 days (09/24/2018)    Time  6    Period  Weeks    Status  On-going    Target Date  11/07/18      PT LONG TERM GOAL #2   Title  Pt will have a decrease in L lateral leg pain to 5/10 or less at worst  to promote ability to ambulate more comfortably and tolerate standing longer.     Baseline  10/10 at worst for the past 2 months (04/30/2018); 7-8/10 L lateral leg pain/tingling at worst for the past month (08/08/2018). 7/10 tingling at most for the past 7 days (09/24/2018)    Time  6    Period  Weeks    Status  On-going    Target Date  11/07/18      PT LONG TERM GOAL #3   Title  Patient will have a decrease in L knee joint pain to 5/10 or less at worst  to promote ability to ambulate more comfortably and tolerate standing longer.     Baseline  10/10 L knee pain at worst for the past 2 months (04/30/2018); 8/10 at worst for the past month (08/08/2018); 7-8/10 L knee pain at worst for the past 7 days (09/24/2018)    Time  6    Period  Weeks    Status  On-going    Target Date  11/07/18      PT LONG TERM GOAL #4   Title  Pt will improve B LE strength by at least 1/2 MMT grade  to promote ability to ambulate more comfortably and tolerate standing longer.     Time  6    Period  Weeks    Status  Partially Met    Target Date  11/07/18      PT LONG TERM GOAL #5   Title  Pt will report being able to tolerate standing to 5 minutes or more to promote ability to stand at church as well as improve ability to perform chores.     Baseline  Pt able to tolerate standing 1 min per subjective reports. (04/30/2018); half a  minute per pt (08/08/2018); 2 minutes 8 seconds with light touch assist (09/24/2018)    Time  6    Period  Weeks    Status  On-going    Target Date  11/07/18      Additional Long Term Goals   Additional Long Term Goals  Yes      PT LONG TERM GOAL #6   Title  Patient will improve her TUG time using SPC to 15 seconds or less as a demonstration of improved functional mobility and balance.    Baseline  26.3 seconds on average using SPC (09/30/2018)    Time  5    Period  Weeks    Status  New    Target Date  11/07/18      PT LONG TERM GOAL #7   Title  Patient will improve her Berg Balance Test score to 40/56 or more as a demonstration of improved balance and decreased fall risk.    Baseline  33/56 (09/30/2018)    Time  5    Period  Weeks    Status  New    Target Date  11/07/18            Plan - 10/31/18 0936    Clinical Impression Statement  Continued working on [employer], and max strengthening to promote lumbopelvic control and decrease femoral IR and adduction to decrease L lateral knee joint pain during gait. Continued working on decreasing L lateral tissue tension around L knee to decrease tension to L patella. No L knee joint pain with gait with SPC after session. Pt will benefit from continued skilled physical therapy services to decrease pain, improve strength and function.    Personal Factors and Comorbidities  Age;Comorbidity 3+;Fitness;Past/Current Experience;Time since onset of injury/illness/exacerbation    Comorbidities  Memory deficit, rheumatoid arthritis, dyspnea, arthritis, B TKA, hx of hysterectomy, myelodysplastic syndrome, arthritis multiple areas    Examination-Activity Limitations  Locomotion Level;Carry;Stairs;Stand    Stability/Clinical Decision Making  Stable/Uncomplicated    Rehab Potential  Fair    Clinical Impairments Affecting Rehab Potential  sedentary lifestyle, age, medical history and chronicity of condition.    PT Frequency  2x / week    PT Duration  6  weeks    PT Treatment/Interventions  Aquatic Therapy;Cryotherapy;Moist Heat;Gait training;Stair training;Functional mobility training;Therapeutic activities;Therapeutic exercise;Patient/family education;Neuromuscular re-education;Balance training;Manual techniques;Taping;Energy conservation;Passive range of motion;Electrical Stimulation;Iontophoresis 40m/ml Dexamethasone;Dry needling    PT Next Visit Plan  Continue with core strengthening, hip strenghtening.    PT Home Exercise Plan  MHighpointAccess code: [email or web address]   Consulted and Agree with Plan of Care  Patient    Family Member Consulted  husband       Patient will benefit from skilled therapeutic intervention in order to improve the following deficits and impairments:  Abnormal gait, Pain, Postural dysfunction, Decreased strength, Difficulty walking, Improper body mechanics, Decreased balance  Visit Diagnosis: Pain in thoracic spine  Radiculopathy, lumbosacral region  Muscle weakness (generalized)  Difficulty in walking, not elsewhere classified  Chronic pain of left knee  Unsteadiness on feet  Chronic bilateral low back pain, unspecified whether sciatica present     Problem List Patient Active Problem List   Diagnosis Date Noted  . Protein-calorie malnutrition ([place] 08/27/2017  . 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 Cubital canal compression syndrome, right 04/18/2017  . Primary osteoarthritis of knee 03/25/2017  . 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 Pure hypercholesterolemia 08/17/2016  . MDS (myelodysplastic syndrome) ([place] 05/12/2016  . B12 deficiency 05/04/2016  . Essential hypertension 05/04/2016  . Mixed stress and urge urinary incontinence 05/04/2016  . Rheumatoid arthritis involving multiple sites with positive rheumatoid factor ([place] 05/04/2016    MJoneen BoersPT, DPT  10/31/2018, 3:00 PM  [facility] [address] [place] [address]Phone: [phone]  Fax:  [phone] Name: Sylvia CreditMRN: [id number] of Birth: 316-Nov-1942

## 2018-11-05 ENCOUNTER — Other Ambulatory Visit: Payer: Self-pay

## 2018-11-05 ENCOUNTER — Ambulatory Visit: Payer: Medicare Other | Attending: Internal Medicine

## 2018-11-05 DIAGNOSIS — M6281 Muscle weakness (generalized): Secondary | ICD-10-CM | POA: Diagnosis present

## 2018-11-05 DIAGNOSIS — G8929 Other chronic pain: Secondary | ICD-10-CM | POA: Insufficient documentation

## 2018-11-05 DIAGNOSIS — M545 Low back pain: Secondary | ICD-10-CM | POA: Insufficient documentation

## 2018-11-05 DIAGNOSIS — R262 Difficulty in walking, not elsewhere classified: Secondary | ICD-10-CM | POA: Diagnosis present

## 2018-11-05 DIAGNOSIS — M25562 Pain in left knee: Secondary | ICD-10-CM | POA: Diagnosis present

## 2018-11-05 DIAGNOSIS — M5417 Radiculopathy, lumbosacral region: Secondary | ICD-10-CM | POA: Diagnosis present

## 2018-11-05 DIAGNOSIS — M546 Pain in thoracic spine: Secondary | ICD-10-CM | POA: Insufficient documentation

## 2018-11-05 DIAGNOSIS — R2681 Unsteadiness on feet: Secondary | ICD-10-CM | POA: Insufficient documentation

## 2018-11-05 NOTE — Therapy (Signed)
[facility] [address], [place], [address] Phone: [phone]   Fax:  [phone]  Physical Therapy Treatment  Patient Details  Name: Sylvia Miller MRN: [id number] Date of Birth: April 29, 1940 Referring Provider (PT): Marlowe Sax, MD   Encounter Date: 11/05/2018  PT End of Session - 11/05/18 1304    Visit Number  28    Number of Visits  37    Date for PT Re-Evaluation  11/07/18    Authorization Type  8    Authorization Time Period  of 10 progress report (from 04/30/2018)    PT Start Time  1304    PT Stop Time  1346    PT Time Calculation (min)  42 min    Activity Tolerance  Patient tolerated treatment well;No increased pain;Patient limited by fatigue    Behavior During Therapy  [facility] for tasks assessed/performed       Past Medical History:  Diagnosis Date  . Allergy   . Anemia   . Arthritis    rheumatoid /knees/hands/shoulders (Right shoulder) infusion every 5 weeks  . Bladder incontinence   . Chicken pox   . Dyspnea    with exertion or far walking  . Gastric ulcer   . GERD (gastroesophageal reflux disease)   . Heart murmur   . Hypercholesteremia   . Hyperlipidemia, unspecified   . Hypertension    controlled with meds;   Marland Kitchen MDS (myelodysplastic syndrome) ([place]) 2000  . Memory deficit   . Muscular dystrophy ([place])    per patient, this is incorrect  . Myelodysplastic syndrome ([place])   . Myelodysplastic syndrome ([place])   . Rheumatoid arthritis ([place])   . Ulcer     Past Surgical History:  Procedure Laterality Date  . ABDOMINAL HYSTERECTOMY    . APPENDECTOMY     as a child  . CHOLECYSTECTOMY    . COLONOSCOPY    . COLONOSCOPY WITH PROPOFOL N/A 04/24/2017   Procedure: COLONOSCOPY WITH PROPOFOL;  Surgeon: Lollie Sails, MD;  Location[facility] ENDOSCOPY;  Service: Endoscopy;  Laterality: N/A;  . ESOPHAGOGASTRODUODENOSCOPY (EGD) WITH PROPOFOL N/A 04/24/2017   Procedure:  ESOPHAGOGASTRODUODENOSCOPY (EGD) WITH PROPOFOL;  Surgeon: Lollie Sails, MD;  Location: [facility] ENDOSCOPY;  Service: Endoscopy;  Laterality: N/A;  . JOINT REPLACEMENT    . KNEE ARTHROPLASTY Right 02/21/2017   Procedure: COMPUTER ASSISTED TOTAL KNEE ARTHROPLASTY;  Surgeon: Dereck Leep, MD;  Location: [facility] ORS;  Service: Orthopedics;  Laterality: Right;  . KNEE ARTHROPLASTY Left 08/06/2017   Procedure: COMPUTER ASSISTED TOTAL KNEE ARTHROPLASTY;  Surgeon: Dereck Leep, MD;  Location: [facility] ORS;  Service: Orthopedics;  Laterality: Left;  . NM INTRAVENOUS INJECTION ([facility] HX)     receives biologic infusions every 5 weeks for rheumatoid arthritis. followed by dr. Meda Coffee at [facility]  . TONSILLECTOMY    . UPPER GASTROINTESTINAL ENDOSCOPY    . WISDOM TOOTH EXTRACTION      There were no vitals filed for this visit.  Subjective Assessment - 11/05/18 1309    Subjective  Not really having L knee pain. Just stiff and a little tingling. 6-7/10 L knee pain at most for the past 7 days. No back pain currently, 6-7/10 back pain at most for the past 7 days. 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 Pt had L TKA 08/05/2017. Last 3-4 months pt  has been having pain L lateral leg. Pt also states feeling pain in her upper back  including her B shoulders. Mid back pain travels down her spine to her low back and feel radiating pain down her L lateral leg. R LE seems fine.   Denies loss of bowel or bladder control, or saddle anesthesia.  No falls within the last 6 months.  Low back and L lateral leg pain seems to have increased since onset.     Patient Stated Goals  Walk more comfortably. Stand longer than a minute at church.    Currently in Pain?  No/denies    Pain Onset  More than a month ago         [facility] PT Assessment - 11/05/18 1319      Observation/Other Assessments   Observations  TUG with  SPC 17 seconds average      Berg Balance Test   Sit to Stand  Able to stand without using hands and stabilize independently    Standing Unsupported  Able to stand safely 2 minutes    Sitting with Back Unsupported but Feet Supported on Floor or Stool  Able to sit safely and securely 2 minutes    Stand to Sit  Sits safely with minimal use of hands    Transfers  Able to transfer safely, minor use of hands    Standing Unsupported with Eyes Closed  Able to stand 10 seconds safely    Standing Unsupported with Feet Together  Able to place feet together independently and stand for 1 minute with supervision    From Standing, Reach Forward with Outstretched Arm  Can reach forward >5 cm safely (2")    From Standing Position, Pick up Object from Floor  Able to pick up shoe, needs supervision    From Standing Position, Turn to Look Behind Over each Shoulder  Looks behind from both sides and weight shifts well    Turn 360 Degrees  Able to turn 360 degrees safely but slowly    Standing Unsupported, Alternately Place Feet on Step/Stool  Able to complete >2 steps/needs minimal assist    Standing Unsupported, One Foot in Front  Able to plae foot ahead of the other independently and hold 30 seconds    Standing on One Leg  Tries to lift leg/unable to hold 3 seconds but remains standing independently   6 seconds R LE, 3 seconds L LE   Total Score  43    Berg comment:  < 46/56 suggests increased need for AD and increased fall risk                           PT Education - 11/05/18 1315    Education provided  Yes    Education Details  ther-ex    [employer]) Educated  Patient    Methods  Explanation;Demonstration;Tactile cues;Verbal cues    Comprehension  Returned demonstration;Verbalized understanding        Objective  MedbridgeAccess Code:Access Code: [email or web address]     Therapeutic exercise  SPO2 99% room air  Standing up from a chair, walking 10 ft forward, then  returning 10 ft, then sitting back onto chair 3x  with SPC 18 seconds, 17 seconds, 16 seconds (17 seconds average)  Able to stand up from the chair without UE assist  Directed patient with sit <> stand throughout session Stand pivot transfer chair <> low mat table Static standing shoulder width apart, then with eyes  closed, then with eyes open feet together, tandem stance with feet shoulder width apart Picking up an object (1 lb weight ) from the floor,  Turning 360 degrees to the R and to the L 1x Looking behind to the R and to the L,  Standing forward reach,   Standing alternate toe taps 4 x each LE onto first regular step   Able to stand 2 min 50 seconds today prior to sitting down   Reviewed progress with balance with PT with pt.   Improved exercise technique, movement at target joints, use of target muscles after mod verbal, visual, tactile cues.     Pt response to treatment Rest breaks provided secondary to fatigue. No complain of increased pain during session.    Clinical impression  Pt demonstrates slight decreased low back, L knee and leg pain levels, improved TUG time suggesting improved balance and functional mobility and improved BERG balance score since last measured suggesting decreased fall risk. Pt able to perform sit <> stand transfers throughout session from regular chair with and without arms without UE assist multiple times on the first attempt demonstrating improved functional strength. Pt making progress with PT towards goals. Pt will benefit from continued skilled physical therapy services to decrease pain, improve strength and function.     PT Short Term Goals - 08/08/18 1826      PT SHORT TERM GOAL #1   Title  Patient will be independent with her HEP to improve strength, decrease pain, improve function    Time  3    Period  Weeks    Status  On-going    Target Date  05/23/18        PT Long Term Goals - 10/02/18 1723      PT LONG TERM GOAL  #1   Title  Patient will have a decrease in mid to low back pain to 5/10 or less at worst to promote ability to ambulate more comfortably and tolerate standing longer.     Baseline  8/10 back pain at worst for the past 2 months (possible greater than 8/10 at worst based on pt reports but pt seems to have difficulty with the pain scale; 04/30/2018); 5-6/10 mid to low back pain at most for the past month (08/08/2018); 7/10 back pain at worst for the past 7 days (09/24/2018)    Time  6    Period  Weeks    Status  On-going    Target Date  11/07/18      PT LONG TERM GOAL #2   Title  Pt will have a decrease in L lateral leg pain to 5/10 or less at worst  to promote ability to ambulate more comfortably and tolerate standing longer.     Baseline  10/10 at worst for the past 2 months (04/30/2018); 7-8/10 L lateral leg pain/tingling at worst for the past month (08/08/2018). 7/10 tingling at most for the past 7 days (09/24/2018)    Time  6    Period  Weeks    Status  On-going    Target Date  11/07/18      PT LONG TERM GOAL #3   Title  Patient will have a decrease in L knee joint pain to 5/10 or less at worst  to promote ability to ambulate more comfortably and tolerate standing longer.     Baseline  10/10 L knee pain at worst for the past 2 months (04/30/2018); 8/10 at worst for the past month (08/08/2018); 7-8/10  L knee pain at worst for the past 7 days (09/24/2018)    Time  6    Period  Weeks    Status  On-going    Target Date  11/07/18      PT LONG TERM GOAL #4   Title  Pt will improve B LE strength by at least 1/2 MMT grade  to promote ability to ambulate more comfortably and tolerate standing longer.     Time  6    Period  Weeks    Status  Partially Met    Target Date  11/07/18      PT LONG TERM GOAL #5   Title  Pt will report being able to tolerate standing to 5 minutes or more to promote ability to stand at church as well as improve ability to perform chores.     Baseline  Pt able to tolerate  standing 1 min per subjective reports. (04/30/2018); half a minute per pt (08/08/2018); 2 minutes 8 seconds with light touch assist (09/24/2018)    Time  6    Period  Weeks    Status  On-going    Target Date  11/07/18      Additional Long Term Goals   Additional Long Term Goals  Yes      PT LONG TERM GOAL #6   Title  Patient will improve her TUG time using SPC to 15 seconds or less as a demonstration of improved functional mobility and balance.    Baseline  26.3 seconds on average using SPC (09/30/2018)    Time  5    Period  Weeks    Status  New    Target Date  11/07/18      PT LONG TERM GOAL #7   Title  Patient will improve her Berg Balance Test score to 40/56 or more as a demonstration of improved balance and decreased fall risk.    Baseline  33/56 (09/30/2018)    Time  5    Period  Weeks    Status  New    Target Date  11/07/18            Plan - 11/05/18 1316    Clinical Impression Statement  Pt demonstrates slight decreased low back, L knee and leg pain levels, improved TUG time suggesting improved balance and functional mobility and improved BERG balance score since last measured suggesting decreased fall risk. Pt able to perform sit <> stand transfers throughout session from regular chair with and without arms without UE assist multiple times on the first attempt demonstrating improved functional strength. Pt making progress with PT towards goals. Pt will benefit from continued skilled physical therapy services to decrease pain, improve strength and function.    Personal Factors and Comorbidities  Age;Comorbidity 3+;Fitness;Past/Current Experience;Time since onset of injury/illness/exacerbation    Comorbidities  Memory deficit, rheumatoid arthritis, dyspnea, arthritis, B TKA, hx of hysterectomy, myelodysplastic syndrome, arthritis multiple areas    Examination-Activity Limitations  Locomotion Level;Carry;Stairs;Stand    Stability/Clinical Decision Making  Stable/Uncomplicated     Rehab Potential  Fair    Clinical Impairments Affecting Rehab Potential  sedentary lifestyle, age, medical history and chronicity of condition.    PT Frequency  2x / week    PT Duration  6 weeks    PT Treatment/Interventions  Aquatic Therapy;Cryotherapy;Moist Heat;Gait training;Stair training;Functional mobility training;Therapeutic activities;Therapeutic exercise;Patient/family education;Neuromuscular re-education;Balance training;Manual techniques;Taping;Energy conservation;Passive range of motion;Electrical Stimulation;Iontophoresis '4mg'$ /ml Dexamethasone;Dry needling    PT Next Visit Plan  Continue with core  strengthening, hip strenghtening.    PT Home Exercise Plan  [place] Access code: [email or web address]    Consulted and Agree with Plan of Care  Patient    Family Member Consulted  husband       Patient will benefit from skilled therapeutic intervention in order to improve the following deficits and impairments:  Abnormal gait, Pain, Postural dysfunction, Decreased strength, Difficulty walking, Improper body mechanics, Decreased balance  Visit Diagnosis: Pain in thoracic spine  Radiculopathy, lumbosacral region  Muscle weakness (generalized)  Difficulty in walking, not elsewhere classified  Chronic pain of left knee  Unsteadiness on feet  Chronic bilateral low back pain, unspecified whether sciatica present     Problem List Patient Active Problem List   Diagnosis Date Noted  . Protein-calorie malnutrition ([place]) 08/27/2017  . 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 Cubital canal compression syndrome, right 04/18/2017  . Primary osteoarthritis of knee 03/25/2017  . 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 Pure hypercholesterolemia 08/17/2016  . MDS (myelodysplastic syndrome) ([place]) 05/12/2016  . B12 deficiency 05/04/2016  . Essential hypertension 05/04/2016  . Mixed stress and urge urinary incontinence 05/04/2016  . Rheumatoid arthritis involving multiple sites  with positive rheumatoid factor ([place]) 05/04/2016   Joneen Boers PT, DPT  11/05/2018, 2:02 PM  [facility] [address], [place], [address] Phone: [phone]   Fax:  [phone]  Name: Sylvia Miller MRN: [id number] Date of Birth: 02/18/1941

## 2018-11-07 ENCOUNTER — Other Ambulatory Visit: Payer: Self-pay

## 2018-11-07 ENCOUNTER — Ambulatory Visit: Payer: Medicare Other

## 2018-11-07 DIAGNOSIS — M546 Pain in thoracic spine: Secondary | ICD-10-CM

## 2018-11-07 DIAGNOSIS — G8929 Other chronic pain: Secondary | ICD-10-CM

## 2018-11-07 DIAGNOSIS — M5417 Radiculopathy, lumbosacral region: Secondary | ICD-10-CM

## 2018-11-07 DIAGNOSIS — R262 Difficulty in walking, not elsewhere classified: Secondary | ICD-10-CM

## 2018-11-07 DIAGNOSIS — M6281 Muscle weakness (generalized): Secondary | ICD-10-CM

## 2018-11-07 DIAGNOSIS — R2681 Unsteadiness on feet: Secondary | ICD-10-CM

## 2018-11-07 NOTE — Therapy (Signed)
[facility] [address], [place], [address] Phone: [phone]   Fax:  [phone]  Physical Therapy Treatment  Patient Details  Name: Sylvia Miller MRN: [id number] Date of Birth: March 17, 1940 Referring Provider (PT): Marlowe Sax, MD   Encounter Date: 11/07/2018  PT End of Session - 11/07/18 1309    Visit Number  29    Number of Visits  49    Date for PT Re-Evaluation  12/19/18    Authorization Type  9    Authorization Time Period  of 10 progress report (from 04/30/2018)    PT Start Time  1310   pt arrived late   PT Stop Time  1341    PT Time Calculation (min)  31 min    Activity Tolerance  Patient tolerated treatment well;No increased pain;Patient limited by fatigue    Behavior During Therapy  [facility] for tasks assessed/performed       Past Medical History:  Diagnosis Date  . Allergy   . Anemia   . Arthritis    rheumatoid /knees/hands/shoulders (Right shoulder) infusion every 5 weeks  . Bladder incontinence   . Chicken pox   . Dyspnea    with exertion or far walking  . Gastric ulcer   . GERD (gastroesophageal reflux disease)   . Heart murmur   . Hypercholesteremia   . Hyperlipidemia, unspecified   . Hypertension    controlled with meds;   Marland Kitchen MDS (myelodysplastic syndrome) ([place]) 2000  . Memory deficit   . Muscular dystrophy ([place])    per patient, this is incorrect  . Myelodysplastic syndrome ([place])   . Myelodysplastic syndrome ([place])   . Rheumatoid arthritis ([place])   . Ulcer     Past Surgical History:  Procedure Laterality Date  . ABDOMINAL HYSTERECTOMY    . APPENDECTOMY     as a child  . CHOLECYSTECTOMY    . COLONOSCOPY    . COLONOSCOPY WITH PROPOFOL N/A 04/24/2017   Procedure: COLONOSCOPY WITH PROPOFOL;  Surgeon: Lollie Sails, MD;  Location[facility] ENDOSCOPY;  Service: Endoscopy;  Laterality: N/A;  . ESOPHAGOGASTRODUODENOSCOPY (EGD) WITH PROPOFOL N/A 04/24/2017   Procedure:  ESOPHAGOGASTRODUODENOSCOPY (EGD) WITH PROPOFOL;  Surgeon: Lollie Sails, MD;  Location: [facility] ENDOSCOPY;  Service: Endoscopy;  Laterality: N/A;  . JOINT REPLACEMENT    . KNEE ARTHROPLASTY Right 02/21/2017   Procedure: COMPUTER ASSISTED TOTAL KNEE ARTHROPLASTY;  Surgeon: Dereck Leep, MD;  Location: [facility] ORS;  Service: Orthopedics;  Laterality: Right;  . KNEE ARTHROPLASTY Left 08/06/2017   Procedure: COMPUTER ASSISTED TOTAL KNEE ARTHROPLASTY;  Surgeon: Dereck Leep, MD;  Location: [facility] ORS;  Service: Orthopedics;  Laterality: Left;  . NM INTRAVENOUS INJECTION ([facility] HX)     receives biologic infusions every 5 weeks for rheumatoid arthritis. followed by dr. Meda Coffee at [facility]  . TONSILLECTOMY    . UPPER GASTROINTESTINAL ENDOSCOPY    . WISDOM TOOTH EXTRACTION      There were no vitals filed for this visit.  Subjective Assessment - 11/07/18 1310    Subjective  L knee aches all the time. 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 Pt had L TKA 08/05/2017. Last 3-4 months pt has been having pain L lateral leg. Pt also states feeling pain in her upper back  including her B shoulders. Mid back pain travels down her spine to her low back and feel  radiating pain down her L lateral leg. R LE seems fine.   Denies loss of bowel or bladder control, or saddle anesthesia.  No falls within the last 6 months.  Low back and L lateral leg pain seems to have increased since onset.     Patient Stated Goals  Walk more comfortably. Stand longer than a minute at church.    Currently in Pain?  Yes    Pain Score  7    6-7/10   Pain Location  Knee    Pain Orientation  Left    Pain Onset  More than a month ago         [facility] PT Assessment - 11/07/18 1313      Observation/Other Assessments   Observations  TUG with SPC 17 seconds average   Measured on 11/05/2018     Strength   Right Hip Flexion  4+/5    Right Hip Extension  4+/5    seated manually resisted   Right Hip ABduction  4+/5   seated manually resisted clamshell isometrics   Left Hip Flexion  4+/5    Left Hip Extension  4+/5   seated manually resisted   Left Hip ABduction  4+/5   seated manually resisted clamshell isometrics   Right Knee Flexion  5/5    Right Knee Extension  5/5    Left Knee Flexion  4+/5    Left Knee Extension  5/5      Berg Balance Test   Sit to Stand  Able to stand without using hands and stabilize independently    Standing Unsupported  Able to stand safely 2 minutes    Sitting with Back Unsupported but Feet Supported on Floor or Stool  Able to sit safely and securely 2 minutes    Stand to Sit  Sits safely with minimal use of hands    Transfers  Able to transfer safely, minor use of hands    Standing Unsupported with Eyes Closed  Able to stand 10 seconds safely    Standing Unsupported with Feet Together  Able to place feet together independently and stand for 1 minute with supervision    From Standing, Reach Forward with Outstretched Arm  Can reach forward >5 cm safely (2")    From Standing Position, Pick up Object from Floor  Able to pick up shoe, needs supervision    From Standing Position, Turn to Look Behind Over each Shoulder  Looks behind from both sides and weight shifts well    Turn 360 Degrees  Able to turn 360 degrees safely but slowly    Standing Unsupported, Alternately Place Feet on Step/Stool  Able to complete >2 steps/needs minimal assist    Standing Unsupported, One Foot in Front  Able to plae foot ahead of the other independently and hold 30 seconds    Standing on One Leg  Tries to lift leg/unable to hold 3 seconds but remains standing independently   6 seconds R LE, 3 seconds L LE   Total Score  43    Berg comment:  < 46/56 suggests increased need for AD and increased fall risk   Measured on 11/05/2018                          PT Education - 11/07/18 1355    Education provided  Yes     Education Details  ther-ex    [employer]) Educated  Patient    Methods  Explanation;Demonstration;Tactile cues;Verbal cues    Comprehension  Returned demonstration;Verbalized understanding         Objective  MedbridgeAccess Code:Access Code: [email or web address]     Therapeutic exercise  seated manually resisted hip flexion, extension, clamshell isometrics, knee flexion, knee extension 1x each way for each LE.   Reviewed progress/current status with LE strength with pt   Seatedmanually resistedclamshell isometrics, hips less than 90 degrees flexion 10x5 seconds for 3 sets    Seated L hip extension isometrics 10x5 seconds for 3 sets  Seated hip adduction ball and glute max squeeze 10x5 seconds for 3 sets   Improved exercise technique, movement at target joints, use of target muscles after mod verbal, visual, tactile cues.     Manual therapy  Seated STM L lateral hamstrings, vastus lateralis, IT band    Improved exercise technique, movement at target joints, use of target muscles after mod verbal, visual, tactile cues.    Pt response to treatment Rest breaks provided secondary to fatigue. Good muscle use felt with exercises. Pt tolerated session well without aggravation of symptoms.     Clinical impression  Pt demonstrates slight decrease in overall back, L knee and lateral leg pain at most compared to previous progress report. She also demonstrates improved B LE strength, TUG time using SPC and improved Berg Balance scores suggesting improved balance and decrease fall risk. Pt is making progress with PT towards strength, balance, and function. She is also able to perform sit <> stand transfers from regular chair without UE assist. Pt will benefit from continued skilled physical therapy services to decrease pain, improve strength and function.      PT Short Term Goals - 08/08/18 1826      PT SHORT TERM GOAL #1   Title  Patient will be independent  with her HEP to improve strength, decrease pain, improve function    Time  3    Period  Weeks    Status  On-going    Target Date  05/23/18        PT Long Term Goals - 11/07/18 1357      PT LONG TERM GOAL #1   Title  Patient will have a decrease in mid to low back pain to 5/10 or less at worst to promote ability to ambulate more comfortably and tolerate standing longer.     Baseline  8/10 back pain at worst for the past 2 months (possible greater than 8/10 at worst based on pt reports but pt seems to have difficulty with the pain scale; 04/30/2018); 5-6/10 mid to low back pain at most for the past month (08/08/2018); 7/10 back pain at worst for the past 7 days (09/24/2018); 6-7/10 back pain at most for the past 7 days (11/05/2018)    Time  6    Period  Weeks    Status  On-going    Target Date  12/19/18      PT LONG TERM GOAL #2   Title  Pt will have a decrease in L lateral leg pain to 5/10 or less at worst  to promote ability to ambulate more comfortably and tolerate standing longer.     Baseline  10/10 at worst for the past 2 months (04/30/2018); 7-8/10 L lateral leg pain/tingling at worst for the past month (08/08/2018). 7/10 tingling at most for the past 7 days (09/24/2018); 6-7/10 at most for the past 7 days (11/05/2018)    Time  6    Period  Weeks  Status  On-going    Target Date  12/19/18      PT LONG TERM GOAL #3   Title  Patient will have a decrease in L knee joint pain to 5/10 or less at worst  to promote ability to ambulate more comfortably and tolerate standing longer.     Baseline  10/10 L knee pain at worst for the past 2 months (04/30/2018); 8/10 at worst for the past month (08/08/2018); 7-8/10 L knee pain at worst for the past 7 days (09/24/2018); 6-7/10 at worst for the past 7 days (11/05/2018)    Time  6    Period  Weeks    Status  On-going    Target Date  12/19/18      PT LONG TERM GOAL #4   Title  Pt will improve B LE strength by at least 1/2 MMT grade  to promote ability to  ambulate more comfortably and tolerate standing longer.     Time  6    Period  Weeks    Status  Partially Met    Target Date  12/19/18      PT LONG TERM GOAL #5   Title  Pt will report being able to tolerate standing to 5 minutes or more to promote ability to stand at church as well as improve ability to perform chores.     Baseline  Pt able to tolerate standing 1 min per subjective reports. (04/30/2018); half a minute per pt (08/08/2018); 2 minutes 8 seconds with light touch assist (09/24/2018); able to stand 2 min and 50 seconds prior to sitting down (11/05/2018)    Time  6    Period  Weeks    Status  On-going    Target Date  12/19/18      PT LONG TERM GOAL #6   Title  Patient will improve her TUG time using SPC to 15 seconds or less as a demonstration of improved functional mobility and balance.    Baseline  26.3 seconds on average using SPC (09/30/2018); 17 seconds average with SPC (11/05/2018)    Time  6    Period  Weeks    Status  Partially Met    Target Date  12/19/18      PT LONG TERM GOAL #7   Title  Patient will improve her Berg Balance Test score to 40/56 or more as a demonstration of improved balance and decreased fall risk.    Baseline  33/56 (09/30/2018); 43/56 (11/05/2018)    Time  6    Period  Weeks    Status  Partially Met    Target Date  12/19/18            Plan - 11/07/18 1350    Clinical Impression Statement  Pt demonstrates slight decrease in overall back, L knee and lateral leg pain at most compared to previous progress report. She also demonstrates improved B LE strength, TUG time using SPC and improved Berg Balance scores suggesting improved balance and decrease fall risk. Pt is making progress with PT towards strength, balance, and function. She is also able to perform sit <> stand transfers from regular chair without UE assist. Pt will benefit from continued skilled physical therapy services to decrease pain, improve strength and function.    Personal Factors and  Comorbidities  Age;Comorbidity 3+;Fitness;Past/Current Experience;Time since onset of injury/illness/exacerbation    Comorbidities  Memory deficit, rheumatoid arthritis, dyspnea, arthritis, B TKA, hx of hysterectomy, myelodysplastic syndrome, arthritis multiple areas  Examination-Activity Limitations  Locomotion Level;Carry;Stairs;Stand    Stability/Clinical Decision Making  Stable/Uncomplicated    Rehab Potential  Fair    Clinical Impairments Affecting Rehab Potential  sedentary lifestyle, age, medical history and chronicity of condition.    PT Frequency  2x / week    PT Duration  6 weeks    PT Treatment/Interventions  Aquatic Therapy;Cryotherapy;Moist Heat;Gait training;Stair training;Functional mobility training;Therapeutic activities;Therapeutic exercise;Patient/family education;Neuromuscular re-education;Balance training;Manual techniques;Taping;Energy conservation;Passive range of motion;Electrical Stimulation;Iontophoresis 38m/ml Dexamethasone;Dry needling    PT Next Visit Plan  Continue with core strengthening, hip strenghtening.    PT Home Exercise Plan  MIcehouse CanyonAccess code: [email or web address]   Consulted and Agree with Plan of Care  Patient    Family Member Consulted  husband       Patient will benefit from skilled therapeutic intervention in order to improve the following deficits and impairments:  Abnormal gait, Pain, Postural dysfunction, Decreased strength, Difficulty walking, Improper body mechanics, Decreased balance  Visit Diagnosis: Chronic pain of left knee - Plan: PT plan of care cert/re-cert  Pain in thoracic spine - Plan: PT plan of care cert/re-cert  Radiculopathy, lumbosacral region - Plan: PT plan of care cert/re-cert  Muscle weakness (generalized) - Plan: PT plan of care cert/re-cert  Difficulty in walking, not elsewhere classified - Plan: PT plan of care cert/re-cert  Unsteadiness on feet - Plan: PT plan of care cert/re-cert  Chronic bilateral low back pain,  unspecified whether sciatica present - Plan: PT plan of care cert/re-cert     Problem List Patient Active Problem List   Diagnosis Date Noted  . Protein-calorie malnutrition ([place] 08/27/2017  . 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 Cubital canal compression syndrome, right 04/18/2017  . Primary osteoarthritis of knee 03/25/2017  . 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 Pure hypercholesterolemia 08/17/2016  . MDS (myelodysplastic syndrome) ([place] 05/12/2016  . B12 deficiency 05/04/2016  . Essential hypertension 05/04/2016  . Mixed stress and urge urinary incontinence 05/04/2016  . Rheumatoid arthritis involving multiple sites with positive rheumatoid factor ([place] 05/04/2016    MJoneen BoersPT, DPT   11/07/2018, 2:24 PM  [facility] [address] [place] [id number]: [phone]  Fax:  [phone] Name: Sylvia CroninMRN: [id number] of Birth: 3October 23, 1942

## 2018-11-13 ENCOUNTER — Other Ambulatory Visit: Payer: Self-pay

## 2018-11-13 ENCOUNTER — Ambulatory Visit: Payer: Medicare Other

## 2018-11-13 DIAGNOSIS — M546 Pain in thoracic spine: Secondary | ICD-10-CM | POA: Diagnosis not present

## 2018-11-13 DIAGNOSIS — M6281 Muscle weakness (generalized): Secondary | ICD-10-CM

## 2018-11-13 DIAGNOSIS — R2681 Unsteadiness on feet: Secondary | ICD-10-CM

## 2018-11-13 DIAGNOSIS — R262 Difficulty in walking, not elsewhere classified: Secondary | ICD-10-CM

## 2018-11-13 DIAGNOSIS — M5417 Radiculopathy, lumbosacral region: Secondary | ICD-10-CM

## 2018-11-13 DIAGNOSIS — M545 Low back pain, unspecified: Secondary | ICD-10-CM

## 2018-11-13 DIAGNOSIS — G8929 Other chronic pain: Secondary | ICD-10-CM

## 2018-11-13 NOTE — Therapy (Signed)
[facility] [address], [place], [address] Phone: [phone]   Fax:  [phone]  Physical Therapy Treatment And Progress Report  (Medicare) (10/08/2018 - 11/13/2018)  Patient Details  Name: Sylvia Miller MRN: [id number] Date of Birth: 1940/11/19 Referring Provider (PT): Marlowe Sax, MD   Encounter Date: 11/13/2018  PT End of Session - 11/13/18 1352    Visit Number  30    Number of Visits  49    Date for PT Re-Evaluation  12/19/18    Authorization Type  10    Authorization Time Period  of 10 progress report    PT Start Time  8850   pt arrived late   PT Stop Time  1432    PT Time Calculation (min)  39 min    Activity Tolerance  Patient tolerated treatment well;No increased pain;Patient limited by fatigue    Behavior During Therapy  [facility] for tasks assessed/performed       Past Medical History:  Diagnosis Date  . Allergy   . Anemia   . Arthritis    rheumatoid /knees/hands/shoulders (Right shoulder) infusion every 5 weeks  . Bladder incontinence   . Chicken pox   . Dyspnea    with exertion or far walking  . Gastric ulcer   . GERD (gastroesophageal reflux disease)   . Heart murmur   . Hypercholesteremia   . Hyperlipidemia, unspecified   . Hypertension    controlled with meds;   Marland Kitchen MDS (myelodysplastic syndrome) ([place]) 2000  . Memory deficit   . Muscular dystrophy ([place])    per patient, this is incorrect  . Myelodysplastic syndrome ([place])   . Myelodysplastic syndrome ([place])   . Rheumatoid arthritis ([place])   . Ulcer     Past Surgical History:  Procedure Laterality Date  . ABDOMINAL HYSTERECTOMY    . APPENDECTOMY     as a child  . CHOLECYSTECTOMY    . COLONOSCOPY    . COLONOSCOPY WITH PROPOFOL N/A 04/24/2017   Procedure: COLONOSCOPY WITH PROPOFOL;  Surgeon: Lollie Sails, MD;  Location: [facility] ENDOSCOPY;  Service: Endoscopy;  Laterality: N/A;  . ESOPHAGOGASTRODUODENOSCOPY (EGD)  WITH PROPOFOL N/A 04/24/2017   Procedure: ESOPHAGOGASTRODUODENOSCOPY (EGD) WITH PROPOFOL;  Surgeon: Lollie Sails, MD;  Location: [facility] ENDOSCOPY;  Service: Endoscopy;  Laterality: N/A;  . JOINT REPLACEMENT    . KNEE ARTHROPLASTY Right 02/21/2017   Procedure: COMPUTER ASSISTED TOTAL KNEE ARTHROPLASTY;  Surgeon: Dereck Leep, MD;  Location: [facility] ORS;  Service: Orthopedics;  Laterality: Right;  . KNEE ARTHROPLASTY Left 08/06/2017   Procedure: COMPUTER ASSISTED TOTAL KNEE ARTHROPLASTY;  Surgeon: Dereck Leep, MD;  Location: [facility] ORS;  Service: Orthopedics;  Laterality: Left;  . NM INTRAVENOUS INJECTION ([facility] HX)     receives biologic infusions every 5 weeks for rheumatoid arthritis. followed by dr. Meda Coffee at [facility]  . TONSILLECTOMY    . UPPER GASTROINTESTINAL ENDOSCOPY    . WISDOM TOOTH EXTRACTION      There were no vitals filed for this visit.  Subjective Assessment - 11/13/18 1354    Subjective  L knee is achy, 6-7/10 L knee ache currently. Back is doing ok.    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 Pt had L TKA 08/05/2017. Last 3-4 months pt has been having pain L lateral leg. Pt also states feeling pain in her upper back  including her B shoulders. Mid back pain travels down her spine  to her low back and feel radiating pain down her L lateral leg. R LE seems fine.   Denies loss of bowel or bladder control, or saddle anesthesia.  No falls within the last 6 months.  Low back and L lateral leg pain seems to have increased since onset.     Patient Stated Goals  Walk more comfortably. Stand longer than a minute at church.    Currently in Pain?  Yes    Pain Score  7     Pain Location  Knee    Pain Orientation  Left    Pain Onset  More than a month ago                               PT Education - 11/13/18 1400    Education provided  Yes    Education Details  ther-ex    [employer]) Educated  Patient    Methods   Explanation;Demonstration;Tactile cues;Verbal cues    Comprehension  Returned demonstration;Verbalized understanding       Objective  MedbridgeAccess Code:Access Code: [email or web address]     Therapeutic exercise  Sit <> stands, no UE assist, regular chair 5x2, then 3x  Seatedmanually resistedclamshell isometrics, hips less than 90 degrees flexion 10x5 seconds for 3 sets   Seated hip adduction ball and glute max squeeze 10x5 seconds for 3 sets  Seated L hip extension isometrics 10x5 seconds for 3 sets  Standing hip abduction with B UE assist   L 10x2   R 10x2  Forward step up onto 4 inch step with B UE assist 5x2 L LE   Improved exercise technique, movement at target joints, use of target muscles after mod verbal, visual, tactile cues.     Manual therapy  Seated STM L lateral hamstrings, vastus lateralis, IT band   slight decrease in L knee ache afterwrads    Improved exercise technique, movement at target joints, use of target muscles after mod verbal, visual, tactile cues.    Pt response to treatment Rest breaks provided secondary to fatigue.Good muscle use felt with exercises. Pt tolerated session well without aggravation of symptoms.     Clinical impression Pt continues to be able to stand up from a regular chair height without use of hands multiple times, suggesting improved functional LE strength. Continued working on glute max, and med strength to promote better mechanics and her L knee and low back to help decrease pain. Pt demonstrates slight decreased low back, L knee and leg pain levels, improved TUG time suggesting improved balance and functional mobility and improved BERG balance score since last measured suggesting decreased fall risk. Pt will benefit from continued skilled physical therapy services to decrease pain, improve strength, function, decrease fall risk and improve ability to ambulate.     PT Short Term Goals -  08/08/18 1826      PT SHORT TERM GOAL #1   Title  Patient will be independent with her HEP to improve strength, decrease pain, improve function    Time  3    Period  Weeks    Status  On-going    Target Date  05/23/18        PT Long Term Goals - 11/07/18 1357      PT LONG TERM GOAL #1   Title  Patient will have a decrease in mid to low back pain to 5/10 or less at worst to promote ability to ambulate more  comfortably and tolerate standing longer.     Baseline  8/10 back pain at worst for the past 2 months (possible greater than 8/10 at worst based on pt reports but pt seems to have difficulty with the pain scale; 04/30/2018); 5-6/10 mid to low back pain at most for the past month (08/08/2018); 7/10 back pain at worst for the past 7 days (09/24/2018); 6-7/10 back pain at most for the past 7 days (11/05/2018)    Time  6    Period  Weeks    Status  On-going    Target Date  12/19/18      PT LONG TERM GOAL #2   Title  Pt will have a decrease in L lateral leg pain to 5/10 or less at worst  to promote ability to ambulate more comfortably and tolerate standing longer.     Baseline  10/10 at worst for the past 2 months (04/30/2018); 7-8/10 L lateral leg pain/tingling at worst for the past month (08/08/2018). 7/10 tingling at most for the past 7 days (09/24/2018); 6-7/10 at most for the past 7 days (11/05/2018)    Time  6    Period  Weeks    Status  On-going    Target Date  12/19/18      PT LONG TERM GOAL #3   Title  Patient will have a decrease in L knee joint pain to 5/10 or less at worst  to promote ability to ambulate more comfortably and tolerate standing longer.     Baseline  10/10 L knee pain at worst for the past 2 months (04/30/2018); 8/10 at worst for the past month (08/08/2018); 7-8/10 L knee pain at worst for the past 7 days (09/24/2018); 6-7/10 at worst for the past 7 days (11/05/2018)    Time  6    Period  Weeks    Status  On-going    Target Date  12/19/18      PT LONG TERM GOAL #4   Title   Pt will improve B LE strength by at least 1/2 MMT grade  to promote ability to ambulate more comfortably and tolerate standing longer.     Time  6    Period  Weeks    Status  Partially Met    Target Date  12/19/18      PT LONG TERM GOAL #5   Title  Pt will report being able to tolerate standing to 5 minutes or more to promote ability to stand at church as well as improve ability to perform chores.     Baseline  Pt able to tolerate standing 1 min per subjective reports. (04/30/2018); half a minute per pt (08/08/2018); 2 minutes 8 seconds with light touch assist (09/24/2018); able to stand 2 min and 50 seconds prior to sitting down (11/05/2018)    Time  6    Period  Weeks    Status  On-going    Target Date  12/19/18      PT LONG TERM GOAL #6   Title  Patient will improve her TUG time using SPC to 15 seconds or less as a demonstration of improved functional mobility and balance.    Baseline  26.3 seconds on average using SPC (09/30/2018); 17 seconds average with SPC (11/05/2018)    Time  6    Period  Weeks    Status  Partially Met    Target Date  12/19/18      PT LONG TERM GOAL #7   Title  Patient will improve her Merrilee Jansky Balance Test score to 40/56 or more as a demonstration of improved balance and decreased fall risk.    Baseline  33/56 (09/30/2018); 43/56 (11/05/2018)    Time  6    Period  Weeks    Status  Partially Met    Target Date  12/19/18            Plan - 11/13/18 1401    Clinical Impression Statement  Pt continues to be able to stand up from a regular chair height without use of hands multiple times, suggesting improved functional LE strength. Continued working on glute max, and med strength to promote better mechanics and her L knee and low back to help decrease pain. Pt demonstrates slight decreased low back, L knee and leg pain levels, improved TUG time suggesting improved balance and functional mobility and improved BERG balance score since last measured suggesting decreased fall  risk. Pt will benefit from continued skilled physical therapy services to decrease pain, improve strength, function, decrease fall risk and improve ability to ambulate.    Personal Factors and Comorbidities  Age;Comorbidity 3+;Fitness;Past/Current Experience;Time since onset of injury/illness/exacerbation    Comorbidities  Memory deficit, rheumatoid arthritis, dyspnea, arthritis, B TKA, hx of hysterectomy, myelodysplastic syndrome, arthritis multiple areas    Examination-Activity Limitations  Locomotion Level;Carry;Stairs;Stand    Stability/Clinical Decision Making  Stable/Uncomplicated    Clinical Decision Making  Low    Clinical Presentation due to:  improving function and balance    Rehab Potential  Fair    Clinical Impairments Affecting Rehab Potential  sedentary lifestyle, age, medical history and chronicity of condition.    PT Frequency  2x / week    PT Duration  6 weeks    PT Treatment/Interventions  Aquatic Therapy;Cryotherapy;Moist Heat;Gait training;Stair training;Functional mobility training;Therapeutic activities;Therapeutic exercise;Patient/family education;Neuromuscular re-education;Balance training;Manual techniques;Taping;Energy conservation;Passive range of motion;Electrical Stimulation;Iontophoresis 60m/ml Dexamethasone;Dry needling    PT Next Visit Plan  Continue with core strengthening, hip strenghtening.    PT Home Exercise Plan  MCommerceAccess code: [email or web address]   Consulted and Agree with Plan of Care  Patient    Family Member Consulted  husband       Patient will benefit from skilled therapeutic intervention in order to improve the following deficits and impairments:  Abnormal gait, Pain, Postural dysfunction, Decreased strength, Difficulty walking, Improper body mechanics, Decreased balance  Visit Diagnosis: Pain in thoracic spine  Radiculopathy, lumbosacral region  Muscle weakness (generalized)  Difficulty in walking, not elsewhere classified  Chronic pain of  left knee  Unsteadiness on feet  Chronic bilateral low back pain, unspecified whether sciatica present     Problem List Patient Active Problem List   Diagnosis Date Noted  . Protein-calorie malnutrition ([place] 08/27/2017  . 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 Cubital canal compression syndrome, right 04/18/2017  . Primary osteoarthritis of knee 03/25/2017  . 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 Pure hypercholesterolemia 08/17/2016  . MDS (myelodysplastic syndrome) ([place] 05/12/2016  . B12 deficiency 05/04/2016  . Essential hypertension 05/04/2016  . Mixed stress and urge urinary incontinence 05/04/2016  . Rheumatoid arthritis involving multiple sites with positive rheumatoid factor ([place] 05/04/2016   Thank you for your referral.  MJoneen BoersPT, DPT   11/13/2018, 5:37 PM  [facility] [address] [place] [address]Phone: [phone]  Fax:  [phone] Name: Sylvia MustoMRN: [id number] of Birth: [birth year]

## 2018-11-19 ENCOUNTER — Other Ambulatory Visit: Payer: Self-pay

## 2018-11-19 ENCOUNTER — Ambulatory Visit: Payer: Medicare Other

## 2018-11-19 DIAGNOSIS — G8929 Other chronic pain: Secondary | ICD-10-CM

## 2018-11-19 DIAGNOSIS — M546 Pain in thoracic spine: Secondary | ICD-10-CM

## 2018-11-19 DIAGNOSIS — M5417 Radiculopathy, lumbosacral region: Secondary | ICD-10-CM

## 2018-11-19 DIAGNOSIS — R262 Difficulty in walking, not elsewhere classified: Secondary | ICD-10-CM

## 2018-11-19 DIAGNOSIS — M6281 Muscle weakness (generalized): Secondary | ICD-10-CM

## 2018-11-19 DIAGNOSIS — M25562 Pain in left knee: Secondary | ICD-10-CM

## 2018-11-19 DIAGNOSIS — R2681 Unsteadiness on feet: Secondary | ICD-10-CM

## 2018-11-19 NOTE — Therapy (Signed)
[place] [address], [place], [address] Phone: [phone]   Fax:  [phone]  Physical Therapy Treatment  Patient Details  Name: Sylvia Miller MRN: [id number] Date of Birth: [birth year] Referring Provider (PT): Marlowe Sax, MD   Encounter Date: 11/19/2018  PT End of Session - 11/19/18 1038    Visit Number  31    Number of Visits  25    Date for PT Re-Evaluation  12/19/18    Authorization Type  1    Authorization Time Period  of 10 progress report    PT Start Time  1038    PT Stop Time  1121    PT Time Calculation (min)  43 min    Activity Tolerance  Patient tolerated treatment well;No increased pain;Patient limited by fatigue    Behavior During Therapy  [facility] for tasks assessed/performed       Past Medical History:  Diagnosis Date  . Allergy   . Anemia   . Arthritis    rheumatoid /knees/hands/shoulders (Right shoulder) infusion every 5 weeks  . Bladder incontinence   . Chicken pox   . Dyspnea    with exertion or far walking  . Gastric ulcer   . GERD (gastroesophageal reflux disease)   . Heart murmur   . Hypercholesteremia   . Hyperlipidemia, unspecified   . Hypertension    controlled with meds;   Marland Kitchen MDS (myelodysplastic syndrome) ([place]) 2000  . Memory deficit   . Muscular dystrophy ([place])    per patient, this is incorrect  . Myelodysplastic syndrome ([place])   . Myelodysplastic syndrome ([place])   . Rheumatoid arthritis ([place])   . Ulcer     Past Surgical History:  Procedure Laterality Date  . ABDOMINAL HYSTERECTOMY    . APPENDECTOMY     as a child  . CHOLECYSTECTOMY    . COLONOSCOPY    . COLONOSCOPY WITH PROPOFOL N/A 04/24/2017   Procedure: COLONOSCOPY WITH PROPOFOL;  Surgeon: Lollie Sails, MD;  Location: [facility] ENDOSCOPY;  Service: Endoscopy;  Laterality: N/A;  . ESOPHAGOGASTRODUODENOSCOPY (EGD) WITH PROPOFOL N/A 04/24/2017   Procedure: ESOPHAGOGASTRODUODENOSCOPY (EGD) WITH  PROPOFOL;  Surgeon: Lollie Sails, MD;  Location: [facility] ENDOSCOPY;  Service: Endoscopy;  Laterality: N/A;  . JOINT REPLACEMENT    . KNEE ARTHROPLASTY Right 02/21/2017   Procedure: COMPUTER ASSISTED TOTAL KNEE ARTHROPLASTY;  Surgeon: Dereck Leep, MD;  Location: [facility] ORS;  Service: Orthopedics;  Laterality: Right;  . KNEE ARTHROPLASTY Left 08/06/2017   Procedure: COMPUTER ASSISTED TOTAL KNEE ARTHROPLASTY;  Surgeon: Dereck Leep, MD;  Location: [facility] ORS;  Service: Orthopedics;  Laterality: Left;  . NM INTRAVENOUS INJECTION ([facility] HX)     receives biologic infusions every 5 weeks for rheumatoid arthritis. followed by dr. Meda Coffee at [place] [facility]  . TONSILLECTOMY    . UPPER GASTROINTESTINAL ENDOSCOPY    . WISDOM TOOTH EXTRACTION      There were no vitals filed for this visit.  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 Pt had L TKA 08/05/2017. Last 3-4 months pt has been having pain L lateral leg. Pt also states feeling pain in her upper back  including her B shoulders. Mid back pain travels down her spine to her low back and feel radiating pain down her L lateral leg. R LE seems fine.   Denies  loss of bowel or bladder control, or saddle anesthesia.  No falls within the last 6 months.  Low back and L lateral leg pain seems to have increased since onset.     Patient Stated Goals  Walk more comfortably. Stand longer than a minute at church.    Currently in Pain?  Yes    Pain Score  7    6-7/10   Pain Onset  More than a month ago                               PT Education - 11/19/18 1047    Education provided  Yes    Education Details  ther-ex    [employer]) Educated  Patient    Methods  Explanation;Demonstration;Tactile cues;Verbal cues    Comprehension  Returned demonstration;Verbalized understanding      Objective  MedbridgeAccess  Code:Access Code: [email or web address]     Therapeutic exercise  Standing with L arch supported: increased L knee soreness  Seated manually resisted L knee flexion targeting medial hamstrings 10x2  Seated L knee extension resisting 5 lbs 10x3  Seated hip adduction ball and glute max squeeze 10x10 seconds for 2 sets   Seatedmanually resistedclamshell isometrics, hips less than 90 degrees flexion 10x5 seconds for 3 sets   Sit <> stands, no UE assist, regular chair 5x2, then 3x   Continue with L standing L hip abduction as well as forward step up onto Air Ex pad next visit if appropriate.       Improved exercise technique, movement at target joints, use of target muscles after mod verbal, visual, tactile cues.    Manual therapy  Seated STM L lateral hamstrings, vastus lateralis, IT band        Improved exercise technique, movement at target joints, use of target muscles after mod verbal, visual, tactile cues.    Pt response to treatment Rest breaks provided secondary to fatigue.Good muscle use felt with exercises. Pt tolerated session well without aggravation of symptoms.    Clinical impression Continued working on L LE strength as well as decreasing L lateral tissue tightness L knee to promote better mechanics and hopefully decrease L knee pain overall. Based on previous notes pt L knee seem to respond well to stability related exercises such as stepping onto Air Ex pad in terms of carry over of decreased L knee pain from one session to the next. Pt back seem to be doing well also secondary to minimal to no complaints of back pain consistently based on subjective reports. Pt will benefit from continued skilled physical therapy services to decrease pain, improve strength, function, and decrease difficulty with gait.         PT Short Term Goals - 08/08/18 1826      PT SHORT TERM GOAL #1   Title  Patient will be independent with her HEP to  improve strength, decrease pain, improve function    Time  3    Period  Weeks    Status  On-going    Target Date  05/23/18        PT Long Term Goals - 11/07/18 1357      PT LONG TERM GOAL #1   Title  Patient will have a decrease in mid to low back pain to 5/10 or less at worst to promote ability to ambulate more comfortably and tolerate standing longer.     Baseline  8/10 back pain at worst  for the past 2 months (possible greater than 8/10 at worst based on pt reports but pt seems to have difficulty with the pain scale; 04/30/2018); 5-6/10 mid to low back pain at most for the past month (08/08/2018); 7/10 back pain at worst for the past 7 days (09/24/2018); 6-7/10 back pain at most for the past 7 days (11/05/2018)    Time  6    Period  Weeks    Status  On-going    Target Date  12/19/18      PT LONG TERM GOAL #2   Title  Pt will have a decrease in L lateral leg pain to 5/10 or less at worst  to promote ability to ambulate more comfortably and tolerate standing longer.     Baseline  10/10 at worst for the past 2 months (04/30/2018); 7-8/10 L lateral leg pain/tingling at worst for the past month (08/08/2018). 7/10 tingling at most for the past 7 days (09/24/2018); 6-7/10 at most for the past 7 days (11/05/2018)    Time  6    Period  Weeks    Status  On-going    Target Date  12/19/18      PT LONG TERM GOAL #3   Title  Patient will have a decrease in L knee joint pain to 5/10 or less at worst  to promote ability to ambulate more comfortably and tolerate standing longer.     Baseline  10/10 L knee pain at worst for the past 2 months (04/30/2018); 8/10 at worst for the past month (08/08/2018); 7-8/10 L knee pain at worst for the past 7 days (09/24/2018); 6-7/10 at worst for the past 7 days (11/05/2018)    Time  6    Period  Weeks    Status  On-going    Target Date  12/19/18      PT LONG TERM GOAL #4   Title  Pt will improve B LE strength by at least 1/2 MMT grade  to promote ability to ambulate more  comfortably and tolerate standing longer.     Time  6    Period  Weeks    Status  Partially Met    Target Date  12/19/18      PT LONG TERM GOAL #5   Title  Pt will report being able to tolerate standing to 5 minutes or more to promote ability to stand at church as well as improve ability to perform chores.     Baseline  Pt able to tolerate standing 1 min per subjective reports. (04/30/2018); half a minute per pt (08/08/2018); 2 minutes 8 seconds with light touch assist (09/24/2018); able to stand 2 min and 50 seconds prior to sitting down (11/05/2018)    Time  6    Period  Weeks    Status  On-going    Target Date  12/19/18      PT LONG TERM GOAL #6   Title  Patient will improve her TUG time using SPC to 15 seconds or less as a demonstration of improved functional mobility and balance.    Baseline  26.3 seconds on average using SPC (09/30/2018); 17 seconds average with SPC (11/05/2018)    Time  6    Period  Weeks    Status  Partially Met    Target Date  12/19/18      PT LONG TERM GOAL #7   Title  Patient will improve her Berg Balance Test score to 40/56 or more as a demonstration  of improved balance and decreased fall risk.    Baseline  33/56 (09/30/2018); 43/56 (11/05/2018)    Time  6    Period  Weeks    Status  Partially Met    Target Date  12/19/18            Plan - 11/19/18 1107    Clinical Impression Statement  Continued working on L LE strength as well as decreasing L lateral tissue tightness L knee to promote better mechanics and hopefully decrease L knee pain overall. Based on previous notes pt L knee seem to respond well to stability related exercises such as stepping onto Air Ex pad in terms of carry over of decreased L knee pain from one session to the next. Pt back seem to be doing well also secondary to minimal to no complaints of back pain consistently based on subjective reports. Pt will benefit from continued skilled physical therapy services to decrease pain, improve  strength, function, and decrease difficulty with gait.    Personal Factors and Comorbidities  Age;Comorbidity 3+;Fitness;Past/Current Experience;Time since onset of injury/illness/exacerbation    Comorbidities  Memory deficit, rheumatoid arthritis, dyspnea, arthritis, B TKA, hx of hysterectomy, myelodysplastic syndrome, arthritis multiple areas    Examination-Activity Limitations  Locomotion Level;Carry;Stairs;Stand    Stability/Clinical Decision Making  Stable/Uncomplicated    Rehab Potential  Fair    Clinical Impairments Affecting Rehab Potential  sedentary lifestyle, age, medical history and chronicity of condition.    PT Frequency  2x / week    PT Duration  6 weeks    PT Treatment/Interventions  Aquatic Therapy;Cryotherapy;Moist Heat;Gait training;Stair training;Functional mobility training;Therapeutic activities;Therapeutic exercise;Patient/family education;Neuromuscular re-education;Balance training;Manual techniques;Taping;Energy conservation;Passive range of motion;Electrical Stimulation;Iontophoresis 46m/ml Dexamethasone;Dry needling    PT Next Visit Plan  Continue with core strengthening, hip strenghtening.    PT Home Exercise Plan  [email or web address] code: [email or web address]   Consulted and Agree with Plan of Care  Patient    Family Member Consulted  husband       Patient will benefit from skilled therapeutic intervention in order to improve the following deficits and impairments:  Abnormal gait, Pain, Postural dysfunction, Decreased strength, Difficulty walking, Improper body mechanics, Decreased balance  Visit Diagnosis: Chronic pain of left knee  Pain in thoracic spine  Radiculopathy, lumbosacral region  Muscle weakness (generalized)  Difficulty in walking, not elsewhere classified  Unsteadiness on feet     Problem List Patient Active Problem List   Diagnosis Date Noted  . Protein-calorie malnutrition ([place] 08/27/2017  . 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 Cubital  canal compression syndrome, right 04/18/2017  . Primary osteoarthritis of knee 03/25/2017  . 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 Pure hypercholesterolemia 08/17/2016  . MDS (myel[place]ysplastic syndrome) ([place] 05/12/2016  . B12 deficiency 05/04/2016  . Essential hypertension 05/04/2016  . Mixed stress and urge urinary incontinence 05/04/2016  . Rheumatoid arthritis involving multiple sites with positive rheumatoid factor ([place] 05/04/2016    MJoneen BoersPT, DPT   11/19/2018, 3:18 PM  [facility] [address] [place] [address]Phone: [phone]  Fax:  [phone] Name: JAlicja EverittMRN: [id number] of Birth: 3July 02, 1942

## 2018-11-25 ENCOUNTER — Ambulatory Visit: Payer: Medicare Other

## 2018-11-25 ENCOUNTER — Telehealth: Payer: Self-pay

## 2018-11-25 NOTE — Telephone Encounter (Signed)
No show. Called patient cell phone but unable to leave a message secondary to the mailbox being full.

## 2018-11-27 ENCOUNTER — Ambulatory Visit: Payer: Medicare Other

## 2018-11-27 ENCOUNTER — Other Ambulatory Visit: Payer: Self-pay

## 2018-11-27 DIAGNOSIS — R262 Difficulty in walking, not elsewhere classified: Secondary | ICD-10-CM

## 2018-11-27 DIAGNOSIS — M546 Pain in thoracic spine: Secondary | ICD-10-CM

## 2018-11-27 DIAGNOSIS — M6281 Muscle weakness (generalized): Secondary | ICD-10-CM

## 2018-11-27 DIAGNOSIS — M5417 Radiculopathy, lumbosacral region: Secondary | ICD-10-CM

## 2018-11-27 DIAGNOSIS — R2681 Unsteadiness on feet: Secondary | ICD-10-CM

## 2018-11-27 DIAGNOSIS — G8929 Other chronic pain: Secondary | ICD-10-CM

## 2018-11-27 DIAGNOSIS — M25562 Pain in left knee: Secondary | ICD-10-CM

## 2018-11-27 NOTE — Therapy (Signed)
[facility] [address] [place], [address] Phone: [phone]   Fax:  [phone]  Physical Therapy Treatment  Patient Details  Name: Sylvia Miller MRN: [id number] Date of Birth: Aug 17, 1940 Referring Provider (PT): Marlowe Sax, MD   Encounter Date: 11/27/2018  PT End of Session - 11/27/18 1310    Visit Number  32    Number of Visits  49    Date for PT Re-Evaluation  12/19/18    Authorization Type  2    Authorization Time Period  of 10 progress report    PT Start Time  1310   pt arrived late   PT Stop Time  1350    PT Time Calculation (min)  40 min    Activity Tolerance  Patient tolerated treatment well;No increased pain;Patient limited by fatigue    Behavior During Therapy  [facility] for tasks assessed/performed       Past Medical History:  Diagnosis Date  . Allergy   . Anemia   . Arthritis    rheumatoid /knees/hands/shoulders (Right shoulder) infusion every 5 weeks  . Bladder incontinence   . Chicken pox   . Dyspnea    with exertion or far walking  . Gastric ulcer   . GERD (gastroesophageal reflux disease)   . Heart murmur   . Hypercholesteremia   . Hyperlipidemia, unspecified   . Hypertension    controlled with meds;   Marland Kitchen MDS (myelodysplastic syndrome) ([place]) 2000  . Memory deficit   . Muscular dystrophy ([place])    per patient, this is incorrect  . Myelodysplastic syndrome ([place])   . Myelodysplastic syndrome ([place])   . Rheumatoid arthritis ([place])   . Ulcer     Past Surgical History:  Procedure Laterality Date  . ABDOMINAL HYSTERECTOMY    . APPENDECTOMY     as a child  . CHOLECYSTECTOMY    . COLONOSCOPY    . COLONOSCOPY WITH PROPOFOL N/A 04/24/2017   Procedure: COLONOSCOPY WITH PROPOFOL;  Surgeon: Lollie Sails, MD;  Location: [facility] ENDOSCOPY;  Service: Endoscopy;  Laterality: N/A;  . ESOPHAGOGASTRODUODENOSCOPY (EGD) WITH PROPOFOL N/A 04/24/2017   Procedure:  ESOPHAGOGASTRODUODENOSCOPY (EGD) WITH PROPOFOL;  Surgeon: Lollie Sails, MD;  Location: [facility] ENDOSCOPY;  Service: Endoscopy;  Laterality: N/A;  . JOINT REPLACEMENT    . KNEE ARTHROPLASTY Right 02/21/2017   Procedure: COMPUTER ASSISTED TOTAL KNEE ARTHROPLASTY;  Surgeon: Dereck Leep, MD;  Location: [facility] ORS;  Service: Orthopedics;  Laterality: Right;  . KNEE ARTHROPLASTY Left 08/06/2017   Procedure: COMPUTER ASSISTED TOTAL KNEE ARTHROPLASTY;  Surgeon: Dereck Leep, MD;  Location: [facility] ORS;  Service: Orthopedics;  Laterality: Left;  . NM INTRAVENOUS INJECTION ([facility] HX)     receives biologic infusions every 5 weeks for rheumatoid arthritis. followed by dr. Meda Coffee at [facility] [facility]  . TONSILLECTOMY    . UPPER GASTROINTESTINAL ENDOSCOPY    . WISDOM TOOTH EXTRACTION      There were no vitals filed for this visit.  Subjective Assessment - 11/27/18 1311    Subjective  Doing ok. L knee 6.5/10 L knee pain while walking.    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 Pt had L TKA 08/05/2017. Last 3-4 months pt has been having pain L lateral leg. Pt also states feeling pain in her upper back  including her B shoulders. Mid back pain travels down her spine to her low back and feel radiating pain down her L  lateral leg. R LE seems fine.   Denies loss of bowel or bladder control, or saddle anesthesia.  No falls within the last 6 months.  Low back and L lateral leg pain seems to have increased since onset.     Patient Stated Goals  Walk more comfortably. Stand longer than a minute at church.    Currently in Pain?  Yes    Pain Score  7    6.5/10 L knee pain when walking   Pain Onset  More than a month ago                               PT Education - 11/27/18 1316    Education provided  Yes    Education Details  ther-ex    [employer]) Educated  Patient    Methods  Explanation;Demonstration;Tactile cues;Verbal cues    Comprehension  Returned  demonstration;Verbalized understanding      Objective  MedbridgeAccess Code:Access Code: [email or web address]     Therapeutic exercise  Sit <> stand with to without UE assist from regular chair 5x2  Seated L knee extension resisting 5 lbs 7x, 10x2  Seatedmanually resistedclamshell isometrics, hips less than 90 degrees flexion 10x5 seconds for 3 sets    Standing L hip abduction 10x2 with B UE assist   Forward step up onto Aix Pad with B UE assist 10x2    Seated hip adduction ball and glute max squeeze 10x5 seconds    Therapeutic rest breaks secondary to pt fatigue and to allow for muscle recovery for next exercise     Improved exercise technique, movement at target joints, use of target muscles after mod verbal, visual, tactile cues.    Manual therapy  Seated STM L  vastus lateralis, IT band Seated myofascial release L IT band area   Improved soft tissue mobility afterwards     Pt response to treatment Rest breaks provided secondary to fatigue and to allow muscle recovery for next exercise.Good muscle use felt with exercises. Pt tolerated session well without aggravation of symptoms.No complain of L knee pain with gait after session.     Clinical impression Continued working on L glute med and quadriceps strengthening to promote better L knee mechanics and stability while ambulating or performing standing tasks. Improved soft tissue mobility L knee after manual therapy. No complain of L knee pain during gait with SPC after session. Pt will benefit from continued skilled physical therapy services to decrease pain, improve strength and function.        PT Short Term Goals - 08/08/18 1826      PT SHORT TERM GOAL #1   Title  Patient will be independent with her HEP to improve strength, decrease pain, improve function    Time  3    Period  Weeks    Status  On-going    Target Date  05/23/18        PT Long Term Goals - 11/07/18 1357       PT LONG TERM GOAL #1   Title  Patient will have a decrease in mid to low back pain to 5/10 or less at worst to promote ability to ambulate more comfortably and tolerate standing longer.     Baseline  8/10 back pain at worst for the past 2 months (possible greater than 8/10 at worst based on pt reports but pt seems to have difficulty with the pain scale; 04/30/2018); 5-6/10 mid to  low back pain at most for the past month (08/08/2018); 7/10 back pain at worst for the past 7 days (09/24/2018); 6-7/10 back pain at most for the past 7 days (11/05/2018)    Time  6    Period  Weeks    Status  On-going    Target Date  12/19/18      PT LONG TERM GOAL #2   Title  Pt will have a decrease in L lateral leg pain to 5/10 or less at worst  to promote ability to ambulate more comfortably and tolerate standing longer.     Baseline  10/10 at worst for the past 2 months (04/30/2018); 7-8/10 L lateral leg pain/tingling at worst for the past month (08/08/2018). 7/10 tingling at most for the past 7 days (09/24/2018); 6-7/10 at most for the past 7 days (11/05/2018)    Time  6    Period  Weeks    Status  On-going    Target Date  12/19/18      PT LONG TERM GOAL #3   Title  Patient will have a decrease in L knee joint pain to 5/10 or less at worst  to promote ability to ambulate more comfortably and tolerate standing longer.     Baseline  10/10 L knee pain at worst for the past 2 months (04/30/2018); 8/10 at worst for the past month (08/08/2018); 7-8/10 L knee pain at worst for the past 7 days (09/24/2018); 6-7/10 at worst for the past 7 days (11/05/2018)    Time  6    Period  Weeks    Status  On-going    Target Date  12/19/18      PT LONG TERM GOAL #4   Title  Pt will improve B LE strength by at least 1/2 MMT grade  to promote ability to ambulate more comfortably and tolerate standing longer.     Time  6    Period  Weeks    Status  Partially Met    Target Date  12/19/18      PT LONG TERM GOAL #5   Title  Pt will report  being able to tolerate standing to 5 minutes or more to promote ability to stand at church as well as improve ability to perform chores.     Baseline  Pt able to tolerate standing 1 min per subjective reports. (04/30/2018); half a minute per pt (08/08/2018); 2 minutes 8 seconds with light touch assist (09/24/2018); able to stand 2 min and 50 seconds prior to sitting down (11/05/2018)    Time  6    Period  Weeks    Status  On-going    Target Date  12/19/18      PT LONG TERM GOAL #6   Title  Patient will improve her TUG time using SPC to 15 seconds or less as a demonstration of improved functional mobility and balance.    Baseline  26.3 seconds on average using SPC (09/30/2018); 17 seconds average with SPC (11/05/2018)    Time  6    Period  Weeks    Status  Partially Met    Target Date  12/19/18      PT LONG TERM GOAL #7   Title  Patient will improve her Berg Balance Test score to 40/56 or more as a demonstration of improved balance and decreased fall risk.    Baseline  33/56 (09/30/2018); 43/56 (11/05/2018)    Time  6    Period  Weeks  Status  Partially Met    Target Date  12/19/18            Plan - 11/27/18 1316    Clinical Impression Statement  Continued working on L glute med and quadriceps strengthening to promote better L knee mechanics and stability while ambulating or performing standing tasks. Improved soft tissue mobility L knee after manual therapy. No complain of L knee pain during gait with SPC after session. Pt will benefit from continued skilled physical therapy services to decrease pain, improve strength and function.    Personal Factors and Comorbidities  Age;Comorbidity 3+;Fitness;Past/Current Experience;Time since onset of injury/illness/exacerbation    Comorbidities  Memory deficit, rheumatoid arthritis, dyspnea, arthritis, B TKA, hx of hysterectomy, myelodysplastic syndrome, arthritis multiple areas    Examination-Activity Limitations  Locomotion Level;Carry;Stairs;Stand     Stability/Clinical Decision Making  Stable/Uncomplicated    Rehab Potential  Fair    Clinical Impairments Affecting Rehab Potential  sedentary lifestyle, age, medical history and chronicity of condition.    PT Frequency  2x / week    PT Duration  6 weeks    PT Treatment/Interventions  Aquatic Therapy;Cryotherapy;Moist Heat;Gait training;Stair training;Functional mobility training;Therapeutic activities;Therapeutic exercise;Patient/family education;Neuromuscular re-education;Balance training;Manual techniques;Taping;Energy conservation;Passive range of motion;Electrical Stimulation;Iontophoresis 5m/ml Dexamethasone;Dry needling    PT Next Visit Plan  Continue with core strengthening, hip strenghtening.    PT Home Exercise Plan  MLelandAccess code: [email or web address]   Consulted and Agree with Plan of Care  Patient    Family Member Consulted  husband       Patient will benefit from skilled therapeutic intervention in order to improve the following deficits and impairments:  Abnormal gait, Pain, Postural dysfunction, Decreased strength, Difficulty walking, Improper body mechanics, Decreased balance  Visit Diagnosis: Chronic pain of left knee  Unsteadiness on feet  Difficulty in walking, not elsewhere classified  Muscle weakness (generalized)  Radiculopathy, lumbosacral region  Pain in thoracic spine     Problem List Patient Active Problem List   Diagnosis Date Noted  . Protein-calorie malnutrition ([place] 08/27/2017  . 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 Cubital canal compression syndrome, right 04/18/2017  . Primary osteoarthritis of knee 03/25/2017  . 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 Pure hypercholesterolemia 08/17/2016  . MDS (myelodysplastic syndrome) ([place] 05/12/2016  . B12 deficiency 05/04/2016  . Essential hypertension 05/04/2016  . Mixed stress and urge urinary incontinence 05/04/2016  . Rheumatoid arthritis involving multiple sites with  positive rheumatoid factor ([place] 05/04/2016      MJoneen BoersPT, DPT   11/27/2018, 2:02 PM  [facility] [address] [place] [address]Phone: [phone]  Fax:  [phone] Name: Sylvia GlynnMRN: [id number] of Birth: 3Jun 22, 1942

## 2018-12-03 ENCOUNTER — Ambulatory Visit: Payer: Medicare Other

## 2018-12-03 ENCOUNTER — Other Ambulatory Visit: Payer: Self-pay

## 2018-12-03 DIAGNOSIS — R262 Difficulty in walking, not elsewhere classified: Secondary | ICD-10-CM

## 2018-12-03 DIAGNOSIS — R2681 Unsteadiness on feet: Secondary | ICD-10-CM

## 2018-12-03 DIAGNOSIS — M5417 Radiculopathy, lumbosacral region: Secondary | ICD-10-CM

## 2018-12-03 DIAGNOSIS — G8929 Other chronic pain: Secondary | ICD-10-CM

## 2018-12-03 DIAGNOSIS — M6281 Muscle weakness (generalized): Secondary | ICD-10-CM

## 2018-12-03 DIAGNOSIS — M546 Pain in thoracic spine: Secondary | ICD-10-CM | POA: Diagnosis not present

## 2018-12-03 NOTE — Therapy (Signed)
[facility] [address] [place], [address] Phone: [phone]   Fax:  [phone]  Physical Therapy Treatment  Patient Details  Name: Sylvia Miller MRN: [id number] Date of Birth: 04-25-1940 Referring Provider (PT): Marlowe Sax, MD   Encounter Date: 12/03/2018  PT End of Session - 12/03/18 1037    Visit Number  33    Number of Visits  14    Date for PT Re-Evaluation  12/19/18    Authorization Type  3    Authorization Time Period  of 10 progress report    PT Start Time  1037    PT Stop Time  8101    PT Time Calculation (min)  40 min    Activity Tolerance  Patient tolerated treatment well;No increased pain;Patient limited by fatigue    Behavior During Therapy  [facility] for tasks assessed/performed       Past Medical History:  Diagnosis Date  . Allergy   . Anemia   . Arthritis    rheumatoid /knees/hands/shoulders (Right shoulder) infusion every 5 weeks  . Bladder incontinence   . Chicken pox   . Dyspnea    with exertion or far walking  . Gastric ulcer   . GERD (gastroesophageal reflux disease)   . Heart murmur   . Hypercholesteremia   . Hyperlipidemia, unspecified   . Hypertension    controlled with meds;   Marland Kitchen MDS (myelodysplastic syndrome) ([place]) 2000  . Memory deficit   . Muscular dystrophy ([place])    per patient, this is incorrect  . Myelodysplastic syndrome ([place])   . Myelodysplastic syndrome ([place])   . Rheumatoid arthritis ([place])   . Ulcer     Past Surgical History:  Procedure Laterality Date  . ABDOMINAL HYSTERECTOMY    . APPENDECTOMY     as a child  . CHOLECYSTECTOMY    . COLONOSCOPY    . COLONOSCOPY WITH PROPOFOL N/A 04/24/2017   Procedure: COLONOSCOPY WITH PROPOFOL;  Surgeon: Lollie Sails, MD;  Location: [facility] ENDOSCOPY;  Service: Endoscopy;  Laterality: N/A;  . ESOPHAGOGASTRODUODENOSCOPY (EGD) WITH PROPOFOL N/A 04/24/2017   Procedure: ESOPHAGOGASTRODUODENOSCOPY (EGD) WITH  PROPOFOL;  Surgeon: Lollie Sails, MD;  Location: [facility] ENDOSCOPY;  Service: Endoscopy;  Laterality: N/A;  . JOINT REPLACEMENT    . KNEE ARTHROPLASTY Right 02/21/2017   Procedure: COMPUTER ASSISTED TOTAL KNEE ARTHROPLASTY;  Surgeon: Dereck Leep, MD;  Location: [facility] ORS;  Service: Orthopedics;  Laterality: Right;  . KNEE ARTHROPLASTY Left 08/06/2017   Procedure: COMPUTER ASSISTED TOTAL KNEE ARTHROPLASTY;  Surgeon: Dereck Leep, MD;  Location: [facility] ORS;  Service: Orthopedics;  Laterality: Left;  . NM INTRAVENOUS INJECTION ([facility] HX)     receives biologic infusions every 5 weeks for rheumatoid arthritis. followed by dr. Meda Coffee at [place] [facility]  . TONSILLECTOMY    . UPPER GASTROINTESTINAL ENDOSCOPY    . WISDOM TOOTH EXTRACTION      There were no vitals filed for this visit.  Subjective Assessment - 12/03/18 1040    Subjective  L knee is pretty good. Hard to say. Did not notice L knee pain when walking from the bathroom to the chair.    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 Pt had L TKA 08/05/2017. Last 3-4 months pt has been having pain L lateral leg. Pt also states feeling pain in her upper back  including her B shoulders. Mid back pain travels down her spine to her low  back and feel radiating pain down her L lateral leg. R LE seems fine.   Denies loss of bowel or bladder control, or saddle anesthesia.  No falls within the last 6 months.  Low back and L lateral leg pain seems to have increased since onset.     Patient Stated Goals  Walk more comfortably. Stand longer than a minute at church.    Currently in Pain?  Yes    Pain Score  7    6-7/10 L lateral leg pain with gait.   Pain Onset  More than a month ago                               PT Education - 12/03/18 1045    Education provided  Yes    Education Details  ther-ex    [employer]) Educated  Patient    Methods  Explanation;Demonstration;Tactile cues;Verbal cues     Comprehension  Returned demonstration;Verbalized understanding        Objective  MedbridgeAccess Code:Access Code: [email or web address]     Therapeutic exercise  Sit <> stand with to without UE assist from regular chair 5x, then 4x  Standing L hip abduction 10x2 with B UE assist   Forward step up onto Aix Pad with B UE assist 10x2  Seatedmanually resistedclamshell isometrics, hips less than 90 degrees flexion 10x5 seconds for 3 sets   Seated hip adduction ball and glute max squeeze 10x5seconds for 3 sets  Seated L knee extension resisting 5 lbs 10x3     Therapeutic rest breaks secondary to pt fatigue and to allow for muscle recovery for next exercise     Improved exercise technique, movement at target joints, use of target muscles after mod verbal, visual, tactile cues.    Manual therapy  Seated STM L  vastus lateralis, IT band              Pt response to treatment Rest breaks provided secondary to fatigue and to allow muscle recovery for next exercise.Good muscle use felt with exercises. Pt tolerated session well without aggravation of symptoms.    Clinical impression Improved L knee pain compared to previous session based on subjective reports. No complain of back pain. Continued working on glute med strengthening and L LE strengthening in closed chain and uneven surface (Air Ex) to promote strength and stability L LE. Pt tolerated session well without aggravation of symptoms. Pt will benefit from continued skilled physical therapy services to decrease pain, improve strength and function, and decrease difficulty with gait.       PT Short Term Goals - 08/08/18 1826      PT SHORT TERM GOAL #1   Title  Patient will be independent with her HEP to improve strength, decrease pain, improve function    Time  3    Period  Weeks    Status  On-going    Target Date  05/23/18        PT Long Term Goals - 11/07/18 1357      PT  LONG TERM GOAL #1   Title  Patient will have a decrease in mid to low back pain to 5/10 or less at worst to promote ability to ambulate more comfortably and tolerate standing longer.     Baseline  8/10 back pain at worst for the past 2 months (possible greater than 8/10 at worst based on pt reports but pt seems to have difficulty with  the pain scale; 04/30/2018); 5-6/10 mid to low back pain at most for the past month (08/08/2018); 7/10 back pain at worst for the past 7 days (09/24/2018); 6-7/10 back pain at most for the past 7 days (11/05/2018)    Time  6    Period  Weeks    Status  On-going    Target Date  12/19/18      PT LONG TERM GOAL #2   Title  Pt will have a decrease in L lateral leg pain to 5/10 or less at worst  to promote ability to ambulate more comfortably and tolerate standing longer.     Baseline  10/10 at worst for the past 2 months (04/30/2018); 7-8/10 L lateral leg pain/tingling at worst for the past month (08/08/2018). 7/10 tingling at most for the past 7 days (09/24/2018); 6-7/10 at most for the past 7 days (11/05/2018)    Time  6    Period  Weeks    Status  On-going    Target Date  12/19/18      PT LONG TERM GOAL #3   Title  Patient will have a decrease in L knee joint pain to 5/10 or less at worst  to promote ability to ambulate more comfortably and tolerate standing longer.     Baseline  10/10 L knee pain at worst for the past 2 months (04/30/2018); 8/10 at worst for the past month (08/08/2018); 7-8/10 L knee pain at worst for the past 7 days (09/24/2018); 6-7/10 at worst for the past 7 days (11/05/2018)    Time  6    Period  Weeks    Status  On-going    Target Date  12/19/18      PT LONG TERM GOAL #4   Title  Pt will improve B LE strength by at least 1/2 MMT grade  to promote ability to ambulate more comfortably and tolerate standing longer.     Time  6    Period  Weeks    Status  Partially Met    Target Date  12/19/18      PT LONG TERM GOAL #5   Title  Pt will report being able  to tolerate standing to 5 minutes or more to promote ability to stand at church as well as improve ability to perform chores.     Baseline  Pt able to tolerate standing 1 min per subjective reports. (04/30/2018); half a minute per pt (08/08/2018); 2 minutes 8 seconds with light touch assist (09/24/2018); able to stand 2 min and 50 seconds prior to sitting down (11/05/2018)    Time  6    Period  Weeks    Status  On-going    Target Date  12/19/18      PT LONG TERM GOAL #6   Title  Patient will improve her TUG time using SPC to 15 seconds or less as a demonstration of improved functional mobility and balance.    Baseline  26.3 seconds on average using SPC (09/30/2018); 17 seconds average with SPC (11/05/2018)    Time  6    Period  Weeks    Status  Partially Met    Target Date  12/19/18      PT LONG TERM GOAL #7   Title  Patient will improve her Berg Balance Test score to 40/56 or more as a demonstration of improved balance and decreased fall risk.    Baseline  33/56 (09/30/2018); 43/56 (11/05/2018)    Time  6  Period  Weeks    Status  Partially Met    Target Date  12/19/18            Plan - 12/03/18 1045    Clinical Impression Statement  Improved L knee pain compared to previous session based on subjective reports. No complain of back pain. Continued working on glute med strengthening and L LE strengthening in closed chain and uneven surface (Air Ex) to promote strength and stability L LE. Pt tolerated session well without aggravation of symptoms. Pt will benefit from continued skilled physical therapy services to decrease pain, improve strength and function, and decrease difficulty with gait.    Personal Factors and Comorbidities  Age;Comorbidity 3+;Fitness;Past/Current Experience;Time since onset of injury/illness/exacerbation    Comorbidities  Memory deficit, rheumatoid arthritis, dyspnea, arthritis, B TKA, hx of hysterectomy, myelodysplastic syndrome, arthritis multiple areas     Examination-Activity Limitations  Locomotion Level;Carry;Stairs;Stand    Stability/Clinical Decision Making  Stable/Uncomplicated    Rehab Potential  Fair    Clinical Impairments Affecting Rehab Potential  sedentary lifestyle, age, medical history and chronicity of condition.    PT Frequency  2x / week    PT Duration  6 weeks    PT Treatment/Interventions  Aquatic Therapy;Cryotherapy;Moist Heat;Gait training;Stair training;Functional mobility training;Therapeutic activities;Therapeutic exercise;Patient/family education;Neuromuscular re-education;Balance training;Manual techniques;Taping;Energy conservation;Passive range of motion;Electrical Stimulation;Iontophoresis 64m/ml Dexamethasone;Dry needling    PT Next Visit Plan  Continue with core strengthening, hip strenghtening.    PT Home Exercise Plan  MLakeland HighlandsAccess code: [email or web address]   Consulted and Agree with Plan of Care  Patient    Family Member Consulted  husband       Patient will benefit from skilled therapeutic intervention in order to improve the following deficits and impairments:  Abnormal gait, Pain, Postural dysfunction, Decreased strength, Difficulty walking, Improper body mechanics, Decreased balance  Visit Diagnosis: Chronic pain of left knee  Unsteadiness on feet  Difficulty in walking, not elsewhere classified  Muscle weakness (generalized)  Radiculopathy, lumbosacral region     Problem List Patient Active Problem List   Diagnosis Date Noted  . Protein-calorie malnutrition ([place] 08/27/2017  . 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 Cubital canal compression syndrome, right 04/18/2017  . Primary osteoarthritis of knee 03/25/2017  . 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 Pure hypercholesterolemia 08/17/2016  . MDS (myelodysplastic syndrome) ([place] 05/12/2016  . B12 deficiency 05/04/2016  . Essential hypertension 05/04/2016  . Mixed stress and urge urinary incontinence 05/04/2016  . Rheumatoid  arthritis involving multiple sites with positive rheumatoid factor ([place] 05/04/2016    MJoneen BoersPT, DPT   12/03/2018, 12:58 PM  [facility] [address] [place] [address]Phone: [phone]  Fax:  [phone] Name: Sylvia FazekasMRN: [id number] of Birth: 3Jul 26, 1942

## 2018-12-12 ENCOUNTER — Ambulatory Visit: Payer: Medicare Other | Attending: Internal Medicine

## 2018-12-12 ENCOUNTER — Other Ambulatory Visit: Payer: Self-pay

## 2018-12-12 DIAGNOSIS — M25562 Pain in left knee: Secondary | ICD-10-CM | POA: Diagnosis not present

## 2018-12-12 DIAGNOSIS — M6281 Muscle weakness (generalized): Secondary | ICD-10-CM

## 2018-12-12 DIAGNOSIS — M5417 Radiculopathy, lumbosacral region: Secondary | ICD-10-CM | POA: Diagnosis present

## 2018-12-12 DIAGNOSIS — G8929 Other chronic pain: Secondary | ICD-10-CM

## 2018-12-12 DIAGNOSIS — R262 Difficulty in walking, not elsewhere classified: Secondary | ICD-10-CM | POA: Diagnosis present

## 2018-12-12 DIAGNOSIS — R2681 Unsteadiness on feet: Secondary | ICD-10-CM

## 2018-12-12 DIAGNOSIS — M546 Pain in thoracic spine: Secondary | ICD-10-CM

## 2018-12-12 DIAGNOSIS — M545 Low back pain, unspecified: Secondary | ICD-10-CM

## 2018-12-12 NOTE — Therapy (Signed)
[facility] [address] [place], [address] Phone: [phone]   Fax:  [phone]  Physical Therapy Treatment  Patient Details  Name: Sylvia Miller MRN: [id number] Date of Birth: 05-30-1940 Referring Provider (PT): Marlowe Sax, MD   Encounter Date: 12/12/2018  PT End of Session - 12/12/18 1439    Visit Number  34    Number of Visits  49    Date for PT Re-Evaluation  12/19/18    Authorization Type  4    Authorization Time Period  of 10 progress report    PT Start Time  1779    PT Stop Time  1519    PT Time Calculation (min)  40 min    Activity Tolerance  Patient tolerated treatment well;No increased pain;Patient limited by fatigue    Behavior During Therapy  [facility] for tasks assessed/performed       Past Medical History:  Diagnosis Date  . Allergy   . Anemia   . Arthritis    rheumatoid /knees/hands/shoulders (Right shoulder) infusion every 5 weeks  . Bladder incontinence   . Chicken pox   . Dyspnea    with exertion or far walking  . Gastric ulcer   . GERD (gastroesophageal reflux disease)   . Heart murmur   . Hypercholesteremia   . Hyperlipidemia, unspecified   . Hypertension    controlled with meds;   Marland Kitchen MDS (myelodysplastic syndrome) ([place]) 2000  . Memory deficit   . Muscular dystrophy ([place])    per patient, this is incorrect  . Myelodysplastic syndrome ([place])   . Myelodysplastic syndrome ([place])   . Rheumatoid arthritis ([place])   . Ulcer     Past Surgical History:  Procedure Laterality Date  . ABDOMINAL HYSTERECTOMY    . APPENDECTOMY     as a child  . CHOLECYSTECTOMY    . COLONOSCOPY    . COLONOSCOPY WITH PROPOFOL N/A 04/24/2017   Procedure: COLONOSCOPY WITH PROPOFOL;  Surgeon: Lollie Sails, MD;  Location: [facility] ENDOSCOPY;  Service: Endoscopy;  Laterality: N/A;  . ESOPHAGOGASTRODUODENOSCOPY (EGD) WITH PROPOFOL N/A 04/24/2017   Procedure: ESOPHAGOGASTRODUODENOSCOPY (EGD) WITH  PROPOFOL;  Surgeon: Lollie Sails, MD;  Location: [facility] ENDOSCOPY;  Service: Endoscopy;  Laterality: N/A;  . JOINT REPLACEMENT    . KNEE ARTHROPLASTY Right 02/21/2017   Procedure: COMPUTER ASSISTED TOTAL KNEE ARTHROPLASTY;  Surgeon: Dereck Leep, MD;  Location: [facility] ORS;  Service: Orthopedics;  Laterality: Right;  . KNEE ARTHROPLASTY Left 08/06/2017   Procedure: COMPUTER ASSISTED TOTAL KNEE ARTHROPLASTY;  Surgeon: Dereck Leep, MD;  Location: [facility] ORS;  Service: Orthopedics;  Laterality: Left;  . NM INTRAVENOUS INJECTION ([facility] HX)     receives biologic infusions every 5 weeks for rheumatoid arthritis. followed by dr. Meda Coffee at [place] [facility]  . TONSILLECTOMY    . UPPER GASTROINTESTINAL ENDOSCOPY    . WISDOM TOOTH EXTRACTION      There were no vitals filed for this visit.  Subjective Assessment - 12/12/18 1439    Subjective  Just got back from a trip. L knee is not doing real good. No falls. L knee does not bother her that much when walking just now, 4-5/10 currently.    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 Pt had L TKA 08/05/2017. Last 3-4 months pt has been having pain L lateral leg. Pt also states feeling pain in her upper back  including her B shoulders. Mid back pain  travels down her spine to her low back and feel radiating pain down her L lateral leg. R LE seems fine.   Denies loss of bowel or bladder control, or saddle anesthesia.  No falls within the last 6 months.  Low back and L lateral leg pain seems to have increased since onset.     Patient Stated Goals  Walk more comfortably. Stand longer than a minute at church.    Currently in Pain?  Yes    Pain Score  5     Pain Onset  More than a month ago                               PT Education - 12/12/18 1445    Education provided  Yes    Education Details  ther-ex    [employer]) Educated  Patient    Methods  Explanation;Demonstration;Tactile cues;Verbal cues     Comprehension  Returned demonstration;Verbalized understanding        Objective  MedbridgeAccess Code:Access Code: [email or web address]     Therapeutic exercise  Forward step up onto Aix Pad with B UE assist 10x2  Standing L hip abduction 10x2, then 5x (upgrade) with B UE assist   Lateral step up onto Air Ex pad with B UE assist   L 10x2  Sit <> stand with to without UE assist from regular chair 5x,    Seatedmanually resistedclamshell isometrics, hips less than 90 degrees flexion 10x5 seconds for 3 sets   Seated hip adduction ball and glute max squeeze 10x5seconds for 3 sets   Static mini lunge L LE with R UE assist 5x2   Therapeutic rest breaks secondary to pt fatigue and to allow for muscle recovery for next exercise     Improved exercise technique, movement at target joints, use of target muscles after mod verbal, visual, tactile cues.    Manual therapy   Seated STM L vastus lateralis, IT band,L lateral hamstrings      Pt response to treatment Rest breaks provided secondary to fatigueand to allow muscle recovery for next exercise.Good muscle use felt with exercises. Pt tolerated session well without aggravation of symptoms. No L knee pain with gait with SPC at end of session.     Clinical impression Pt arrives today with decreased starting pain level in L knee compared to previous sessions. Continued working on glute med strengthening and closed chain L quadriceps strengthening to promote stability and ability to support herself with L LE during gait. Continued working on decreasing soft tissue tension L lateral knee to promote better mechanics at the joint as well. Pt tolerated session well without aggravation of symptoms. No L knee pain with gait with SPC at end of session. Pt will benefit from continued skilled physical therapy services to decrease pain, improve strength, function, and decrease difficulty with  gait.       PT Short Term Goals - 08/08/18 1826      PT SHORT TERM GOAL #1   Title  Patient will be independent with her HEP to improve strength, decrease pain, improve function    Time  3    Period  Weeks    Status  On-going    Target Date  05/23/18        PT Long Term Goals - 11/07/18 1357      PT LONG TERM GOAL #1   Title  Patient will have a decrease in mid to  low back pain to 5/10 or less at worst to promote ability to ambulate more comfortably and tolerate standing longer.     Baseline  8/10 back pain at worst for the past 2 months (possible greater than 8/10 at worst based on pt reports but pt seems to have difficulty with the pain scale; 04/30/2018); 5-6/10 mid to low back pain at most for the past month (08/08/2018); 7/10 back pain at worst for the past 7 days (09/24/2018); 6-7/10 back pain at most for the past 7 days (11/05/2018)    Time  6    Period  Weeks    Status  On-going    Target Date  12/19/18      PT LONG TERM GOAL #2   Title  Pt will have a decrease in L lateral leg pain to 5/10 or less at worst  to promote ability to ambulate more comfortably and tolerate standing longer.     Baseline  10/10 at worst for the past 2 months (04/30/2018); 7-8/10 L lateral leg pain/tingling at worst for the past month (08/08/2018). 7/10 tingling at most for the past 7 days (09/24/2018); 6-7/10 at most for the past 7 days (11/05/2018)    Time  6    Period  Weeks    Status  On-going    Target Date  12/19/18      PT LONG TERM GOAL #3   Title  Patient will have a decrease in L knee joint pain to 5/10 or less at worst  to promote ability to ambulate more comfortably and tolerate standing longer.     Baseline  10/10 L knee pain at worst for the past 2 months (04/30/2018); 8/10 at worst for the past month (08/08/2018); 7-8/10 L knee pain at worst for the past 7 days (09/24/2018); 6-7/10 at worst for the past 7 days (11/05/2018)    Time  6    Period  Weeks    Status  On-going    Target Date  12/19/18       PT LONG TERM GOAL #4   Title  Pt will improve B LE strength by at least 1/2 MMT grade  to promote ability to ambulate more comfortably and tolerate standing longer.     Time  6    Period  Weeks    Status  Partially Met    Target Date  12/19/18      PT LONG TERM GOAL #5   Title  Pt will report being able to tolerate standing to 5 minutes or more to promote ability to stand at church as well as improve ability to perform chores.     Baseline  Pt able to tolerate standing 1 min per subjective reports. (04/30/2018); half a minute per pt (08/08/2018); 2 minutes 8 seconds with light touch assist (09/24/2018); able to stand 2 min and 50 seconds prior to sitting down (11/05/2018)    Time  6    Period  Weeks    Status  On-going    Target Date  12/19/18      PT LONG TERM GOAL #6   Title  Patient will improve her TUG time using SPC to 15 seconds or less as a demonstration of improved functional mobility and balance.    Baseline  26.3 seconds on average using SPC (09/30/2018); 17 seconds average with SPC (11/05/2018)    Time  6    Period  Weeks    Status  Partially Met    Target Date  12/19/18      PT LONG TERM GOAL #7   Title  Patient will improve her Berg Balance Test score to 40/56 or more as a demonstration of improved balance and decreased fall risk.    Baseline  33/56 (09/30/2018); 43/56 (11/05/2018)    Time  6    Period  Weeks    Status  Partially Met    Target Date  12/19/18            Plan - 12/12/18 1446    Clinical Impression Statement  Pt arrives today with decreased starting pain level in L knee compared to previous sessions. Continued working on glute med strengthening and closed chain L quadriceps strengthening to promote stability and ability to support herself with L LE during gait. Continued working on decreasing soft tissue tension L lateral knee to promote better mechanics at the joint as well. Pt tolerated session well without aggravation of symptoms. No L knee pain with  gait with SPC at end of session. Pt will benefit from continued skilled physical therapy services to decrease pain, improve strength, function, and decrease difficulty with gait.    Personal Factors and Comorbidities  Age;Comorbidity 3+;Fitness;Past/Current Experience;Time since onset of injury/illness/exacerbation    Comorbidities  Memory deficit, rheumatoid arthritis, dyspnea, arthritis, B TKA, hx of hysterectomy, myelodysplastic syndrome, arthritis multiple areas    Examination-Activity Limitations  Locomotion Level;Carry;Stairs;Stand    Stability/Clinical Decision Making  Stable/Uncomplicated    Rehab Potential  Fair    Clinical Impairments Affecting Rehab Potential  sedentary lifestyle, age, medical history and chronicity of condition.    PT Frequency  2x / week    PT Duration  6 weeks    PT Treatment/Interventions  Aquatic Therapy;Cryotherapy;Moist Heat;Gait training;Stair training;Functional mobility training;Therapeutic activities;Therapeutic exercise;Patient/family education;Neuromuscular re-education;Balance training;Manual techniques;Taping;Energy conservation;Passive range of motion;Electrical Stimulation;Iontophoresis 72m/ml Dexamethasone;Dry needling    PT Next Visit Plan  Continue with core strengthening, hip strenghtening.    PT Home Exercise Plan  MBlandAccess code: [email or web address]   Consulted and Agree with Plan of Care  Patient    Family Member Consulted  husband       Patient will benefit from skilled therapeutic intervention in order to improve the following deficits and impairments:  Abnormal gait, Pain, Postural dysfunction, Decreased strength, Difficulty walking, Improper body mechanics, Decreased balance  Visit Diagnosis: Chronic pain of left knee  Unsteadiness on feet  Difficulty in walking, not elsewhere classified  Muscle weakness (generalized)  Radiculopathy, lumbosacral region  Chronic bilateral low back pain, unspecified whether sciatica present  Pain in  thoracic spine     Problem List Patient Active Problem List   Diagnosis Date Noted  . Protein-calorie malnutrition ([place] 08/27/2017  . 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 Cubital canal compression syndrome, right 04/18/2017  . Primary osteoarthritis of knee 03/25/2017  . 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 Pure hypercholesterolemia 08/17/2016  . MDS (myelodysplastic syndrome) ([place] 05/12/2016  . B12 deficiency 05/04/2016  . Essential hypertension 05/04/2016  . Mixed stress and urge urinary incontinence 05/04/2016  . Rheumatoid arthritis involving multiple sites with positive rheumatoid factor ([place] 05/04/2016   MJoneen BoersPT, DPT  12/12/2018, 3:36 PM  [facility] [address] [place] [address]Phone: [phone]  Fax:  [phone] Name: JRissa TurleyMRN: [id number] of Birth: 3November 11, 1942

## 2018-12-17 ENCOUNTER — Ambulatory Visit: Payer: Medicare Other

## 2018-12-17 ENCOUNTER — Other Ambulatory Visit: Payer: Self-pay

## 2018-12-17 DIAGNOSIS — R2681 Unsteadiness on feet: Secondary | ICD-10-CM

## 2018-12-17 DIAGNOSIS — M5417 Radiculopathy, lumbosacral region: Secondary | ICD-10-CM

## 2018-12-17 DIAGNOSIS — M25562 Pain in left knee: Secondary | ICD-10-CM | POA: Diagnosis not present

## 2018-12-17 DIAGNOSIS — M545 Low back pain, unspecified: Secondary | ICD-10-CM

## 2018-12-17 DIAGNOSIS — G8929 Other chronic pain: Secondary | ICD-10-CM

## 2018-12-17 DIAGNOSIS — R262 Difficulty in walking, not elsewhere classified: Secondary | ICD-10-CM

## 2018-12-17 DIAGNOSIS — M6281 Muscle weakness (generalized): Secondary | ICD-10-CM

## 2018-12-17 NOTE — Therapy (Signed)
[facility] [address], [place], [address] Phone: [phone]   Fax:  [phone]  Physical Therapy Treatment  Patient Details  Name: Sylvia Miller MRN: [id number] Date of Birth: [birth year] Referring Provider (PT): Marlowe Sax, MD   Encounter Date: 12/17/2018  PT End of Session - 12/17/18 1304    Visit Number  33    Number of Visits  90    Date for PT Re-Evaluation  12/19/18    Authorization Type  3    Authorization Time Period  of 10 progress report    PT Start Time  1304    PT Stop Time  1344    PT Time Calculation (min)  40 min    Activity Tolerance  Patient tolerated treatment well;No increased pain;Patient limited by fatigue    Behavior During Therapy  [facility] for tasks assessed/performed       Past Medical History:  Diagnosis Date  . Allergy   . Anemia   . Arthritis    rheumatoid /knees/hands/shoulders (Right shoulder) infusion every 5 weeks  . Bladder incontinence   . Chicken pox   . Dyspnea    with exertion or far walking  . Gastric ulcer   . GERD (gastroesophageal reflux disease)   . Heart murmur   . Hypercholesteremia   . Hyperlipidemia, unspecified   . Hypertension    controlled with meds;   Marland Kitchen MDS (myelodysplastic syndrome) ([place]) 2000  . Memory deficit   . Muscular dystrophy ([place])    per patient, this is incorrect  . Myelodysplastic syndrome ([place])   . Myelodysplastic syndrome ([place])   . Rheumatoid arthritis ([place])   . Ulcer     Past Surgical History:  Procedure Laterality Date  . ABDOMINAL HYSTERECTOMY    . APPENDECTOMY     as a child  . CHOLECYSTECTOMY    . COLONOSCOPY    . COLONOSCOPY WITH PROPOFOL N/A 04/24/2017   Procedure: COLONOSCOPY WITH PROPOFOL;  Surgeon: Lollie Sails, MD;  Location[facility] ENDOSCOPY;  Service: Endoscopy;  Laterality: N/A;  . ESOPHAGOGASTRODUODENOSCOPY (EGD) WITH PROPOFOL N/A 04/24/2017   Procedure: ESOPHAGOGASTRODUODENOSCOPY (EGD)  WITH PROPOFOL;  Surgeon: Lollie Sails, MD;  Location: [facility] ENDOSCOPY;  Service: Endoscopy;  Laterality: N/A;  . JOINT REPLACEMENT    . KNEE ARTHROPLASTY Right 02/21/2017   Procedure: COMPUTER ASSISTED TOTAL KNEE ARTHROPLASTY;  Surgeon: Dereck Leep, MD;  Location: [facility] ORS;  Service: Orthopedics;  Laterality: Right;  . KNEE ARTHROPLASTY Left 08/06/2017   Procedure: COMPUTER ASSISTED TOTAL KNEE ARTHROPLASTY;  Surgeon: Dereck Leep, MD;  Location: [facility] ORS;  Service: Orthopedics;  Laterality: Left;  . NM INTRAVENOUS INJECTION ([facility] HX)     receives biologic infusions every 5 weeks for rheumatoid arthritis. followed by dr. Meda Coffee at [facility]  . TONSILLECTOMY    . UPPER GASTROINTESTINAL ENDOSCOPY    . WISDOM TOOTH EXTRACTION      There were no vitals filed for this visit.  Subjective Assessment - 12/17/18 1306    Subjective  L knee feels iffy. 6/10 currently when walking. Low back and L lateral leg do not bother her as much as her L knee.    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 Pt had L TKA 08/05/2017. Last 3-4 months pt has been having pain L lateral leg. Pt also states feeling pain in her upper back  including her B shoulders. Mid back pain travels down her spine to  her low back and feel radiating pain down her L lateral leg. R LE seems fine.   Denies loss of bowel or bladder control, or saddle anesthesia.  No falls within the last 6 months.  Low back and L lateral leg pain seems to have increased since onset.     Patient Stated Goals  Walk more comfortably. Stand longer than a minute at church.    Currently in Pain?  Yes    Pain Score  6     Pain Onset  More than a month ago                               PT Education - 12/17/18 1311    Education provided  Yes    Education Details  ther-ex    [employer]) Educated  Patient    Methods  Explanation;Demonstration;Tactile cues;Verbal cues    Comprehension  Returned  demonstration;Verbalized understanding      Objective  MedbridgeAccess Code:Access Code: [email or web address]     Therapeutic exercise   Standing hip abduction with B UE assist to promote glute med muscle strengthening  L 10x2  R 10x2   Forward step up onto Aix Pad with B UE assist 10x2  Standing B scapular retraction red band 10x to promote thoracic extension and decrease low back pressure  B posterior thigh discomfort  Then performed in sitting 10x2   No B posterior thigh symptoms   Sit <> stand with to without UE assist from regular chair 5x  Static mini lunge L LE with R UE assist 5x2  Lateral step up onto Air Ex pad with B UE assist              L 10x2   Seated hip adduction ball and glute max squeeze 10x5secondsfor sets   Therapeutic rest breaks secondary to pt fatigue and to allow for muscle recovery for next exercise     Improved exercise technique, movement at target joints, use of target muscles after mod verbal, visual, tactile cues.    Manual therapy   Seated STM L vastus lateralis, IT band,L lateral hamstrings      Pt response to treatment Rest breaks provided secondary to fatigueand to allow muscle recovery for next exercise.Good muscle use felt with exercises. Pt tolerated session well without aggravation of symptoms.     Clinical impression Continued working on glute strengthening and manual therapy to decrease soft tissue limitations L lateral knee to promote better L knee mechanics during gait. Also continued working on improving L LE strength to promote ability to support herself better with L LE with standing activities and with gait. Pt tolerated session well without aggravation of symptoms. Pt will benefit from continued skilled physical therapy services to decrease pain, improve strength and function.        PT Short Term Goals - 08/08/18 1826      PT SHORT TERM GOAL #1   Title   Patient will be independent with her HEP to improve strength, decrease pain, improve function    Time  3    Period  Weeks    Status  On-going    Target Date  05/23/18        PT Long Term Goals - 11/07/18 1357      PT LONG TERM GOAL #1   Title  Patient will have a decrease in mid to low back pain to 5/10 or less at worst to  promote ability to ambulate more comfortably and tolerate standing longer.     Baseline  8/10 back pain at worst for the past 2 months (possible greater than 8/10 at worst based on pt reports but pt seems to have difficulty with the pain scale; 04/30/2018); 5-6/10 mid to low back pain at most for the past month (08/08/2018); 7/10 back pain at worst for the past 7 days (09/24/2018); 6-7/10 back pain at most for the past 7 days (11/05/2018)    Time  6    Period  Weeks    Status  On-going    Target Date  12/19/18      PT LONG TERM GOAL #2   Title  Pt will have a decrease in L lateral leg pain to 5/10 or less at worst  to promote ability to ambulate more comfortably and tolerate standing longer.     Baseline  10/10 at worst for the past 2 months (04/30/2018); 7-8/10 L lateral leg pain/tingling at worst for the past month (08/08/2018). 7/10 tingling at most for the past 7 days (09/24/2018); 6-7/10 at most for the past 7 days (11/05/2018)    Time  6    Period  Weeks    Status  On-going    Target Date  12/19/18      PT LONG TERM GOAL #3   Title  Patient will have a decrease in L knee joint pain to 5/10 or less at worst  to promote ability to ambulate more comfortably and tolerate standing longer.     Baseline  10/10 L knee pain at worst for the past 2 months (04/30/2018); 8/10 at worst for the past month (08/08/2018); 7-8/10 L knee pain at worst for the past 7 days (09/24/2018); 6-7/10 at worst for the past 7 days (11/05/2018)    Time  6    Period  Weeks    Status  On-going    Target Date  12/19/18      PT LONG TERM GOAL #4   Title  Pt will improve B LE strength by at least 1/2 MMT  grade  to promote ability to ambulate more comfortably and tolerate standing longer.     Time  6    Period  Weeks    Status  Partially Met    Target Date  12/19/18      PT LONG TERM GOAL #5   Title  Pt will report being able to tolerate standing to 5 minutes or more to promote ability to stand at church as well as improve ability to perform chores.     Baseline  Pt able to tolerate standing 1 min per subjective reports. (04/30/2018); half a minute per pt (08/08/2018); 2 minutes 8 seconds with light touch assist (09/24/2018); able to stand 2 min and 50 seconds prior to sitting down (11/05/2018)    Time  6    Period  Weeks    Status  On-going    Target Date  12/19/18      PT LONG TERM GOAL #6   Title  Patient will improve her TUG time using SPC to 15 seconds or less as a demonstration of improved functional mobility and balance.    Baseline  26.3 seconds on average using SPC (09/30/2018); 17 seconds average with SPC (11/05/2018)    Time  6    Period  Weeks    Status  Partially Met    Target Date  12/19/18      PT LONG TERM  GOAL #7   Title  Patient will improve her Merrilee Jansky Balance Test score to 40/56 or more as a demonstration of improved balance and decreased fall risk.    Baseline  33/56 (09/30/2018); 43/56 (11/05/2018)    Time  6    Period  Weeks    Status  Partially Met    Target Date  12/19/18            Plan - 12/17/18 1301    Clinical Impression Statement  Continued working on glute strengthening and manual therapy to decrease soft tissue limitations L lateral knee to promote better L knee mechanics during gait. Also continued working on improving L LE strength to promote ability to support herself better with L LE with standing activities and with gait. Pt tolerated session well without aggravation of symptoms. Pt will benefit from continued skilled physical therapy services to decrease pain, improve strength and function.    Personal Factors and Comorbidities  Age;Comorbidity  3+;Fitness;Past/Current Experience;Time since onset of injury/illness/exacerbation    Comorbidities  Memory deficit, rheumatoid arthritis, dyspnea, arthritis, B TKA, hx of hysterectomy, myelodysplastic syndrome, arthritis multiple areas    Examination-Activity Limitations  Locomotion Level;Carry;Stairs;Stand    Stability/Clinical Decision Making  Stable/Uncomplicated    Clinical Decision Making  --    Rehab Potential  Fair    Clinical Impairments Affecting Rehab Potential  sedentary lifestyle, age, medical history and chronicity of condition.    PT Frequency  2x / week    PT Duration  6 weeks    PT Treatment/Interventions  Aquatic Therapy;Cryotherapy;Moist Heat;Gait training;Stair training;Functional mobility training;Therapeutic activities;Therapeutic exercise;Patient/family education;Neuromuscular re-education;Balance training;Manual techniques;Taping;Energy conservation;Passive range of motion;Electrical Stimulation;Iontophoresis 13m/ml Dexamethasone;Dry needling    PT Next Visit Plan  Continue with core strengthening, hip strenghtening.    PT Home Exercise Plan  MPort ClintonAccess code: [email or web address]   Consulted and Agree with Plan of Care  Patient    Family Member Consulted  --       Patient will benefit from skilled therapeutic intervention in order to improve the following deficits and impairments:  Abnormal gait, Pain, Postural dysfunction, Decreased strength, Difficulty walking, Improper body mechanics, Decreased balance  Visit Diagnosis: Chronic pain of left knee  Unsteadiness on feet  Difficulty in walking, not elsewhere classified  Muscle weakness (generalized)  Radiculopathy, lumbosacral region  Chronic bilateral low back pain, unspecified whether sciatica present     Problem List Patient Active Problem List   Diagnosis Date Noted  . Protein[place]calorie malnutrition ([place] 08/27/2017  . 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 Cubital canal compression syndrome, right  04/18/2017  . Primary osteoarthritis of knee 03/25/2017  . 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 Pure hypercholesterolemia 08/17/2016  . MDS (myelodysplastic syndrome) [place] 05/12/2016  . B12 deficiency 05/04/2016  . Essential hypertension 05/04/2016  . Mixed stress and urge urinary incontinence 05/04/2016  . Rheumatoid arthritis involving multiple sites with positive rheumatoid factor ([place] 05/04/2016   MJoneen BoersPT, DPT  12/17/2018, 4:40 PM  [facility] [address] [place] [address]Phone: [phone]  Fax:  [phone] Name: JTerresa MarlettMRN: [id number] of Birth: [birth year]

## 2018-12-19 ENCOUNTER — Ambulatory Visit: Payer: Medicare Other

## 2018-12-19 ENCOUNTER — Other Ambulatory Visit: Payer: Self-pay

## 2018-12-19 DIAGNOSIS — G8929 Other chronic pain: Secondary | ICD-10-CM

## 2018-12-19 DIAGNOSIS — M25562 Pain in left knee: Secondary | ICD-10-CM | POA: Diagnosis not present

## 2018-12-19 DIAGNOSIS — M5417 Radiculopathy, lumbosacral region: Secondary | ICD-10-CM

## 2018-12-19 DIAGNOSIS — R262 Difficulty in walking, not elsewhere classified: Secondary | ICD-10-CM

## 2018-12-19 DIAGNOSIS — R2681 Unsteadiness on feet: Secondary | ICD-10-CM

## 2018-12-19 DIAGNOSIS — M6281 Muscle weakness (generalized): Secondary | ICD-10-CM

## 2018-12-19 DIAGNOSIS — M545 Low back pain: Secondary | ICD-10-CM

## 2018-12-19 NOTE — Therapy (Signed)
[facility] [address], [place], [address] Phone: [phone]   Fax:  [phone]  Physical Therapy Treatment  Patient Details  Name: Sylvia Miller MRN: [id number] Date of Birth: 1940-06-09 Referring Provider (PT): Marlowe Sax, MD   Encounter Date: 12/19/2018  PT End of Session - 12/19/18 1501    Visit Number  33    Number of Visits  49    Date for PT Re-Evaluation  12/19/18    Authorization Type  3    Authorization Time Period  of 10 progress report    PT Start Time  1501    PT Stop Time  1245    PT Time Calculation (min)  41 min    Activity Tolerance  Patient tolerated treatment well;No increased pain;Patient limited by fatigue    Behavior During Therapy  [facility] for tasks assessed/performed       Past Medical History:  Diagnosis Date  . Allergy   . Anemia   . Arthritis    rheumatoid /knees/hands/shoulders (Right shoulder) infusion every 5 weeks  . Bladder incontinence   . Chicken pox   . Dyspnea    with exertion or far walking  . Gastric ulcer   . GERD (gastroesophageal reflux disease)   . Heart murmur   . Hypercholesteremia   . Hyperlipidemia, unspecified   . Hypertension    controlled with meds;   Marland Kitchen MDS (myelodysplastic syndrome) ([place]) 2000  . Memory deficit   . Muscular dystrophy ([place])    per patient, this is incorrect  . Myelodysplastic syndrome ([place])   . Myelodysplastic syndrome ([place])   . Rheumatoid arthritis ([place])   . Ulcer     Past Surgical History:  Procedure Laterality Date  . ABDOMINAL HYSTERECTOMY    . APPENDECTOMY     as a child  . CHOLECYSTECTOMY    . COLONOSCOPY    . COLONOSCOPY WITH PROPOFOL N/A 04/24/2017   Procedure: COLONOSCOPY WITH PROPOFOL;  Surgeon: Lollie Sails, MD;  Location: [facility] ENDOSCOPY;  Service: Endoscopy;  Laterality: N/A;  . ESOPHAGOGASTRODUODENOSCOPY (EGD) WITH PROPOFOL N/A 04/24/2017   Procedure: ESOPHAGOGASTRODUODENOSCOPY (EGD)  WITH PROPOFOL;  Surgeon: Lollie Sails, MD;  Location: [facility] ENDOSCOPY;  Service: Endoscopy;  Laterality: N/A;  . JOINT REPLACEMENT    . KNEE ARTHROPLASTY Right 02/21/2017   Procedure: COMPUTER ASSISTED TOTAL KNEE ARTHROPLASTY;  Surgeon: Dereck Leep, MD;  Location: [facility] ORS;  Service: Orthopedics;  Laterality: Right;  . KNEE ARTHROPLASTY Left 08/06/2017   Procedure: COMPUTER ASSISTED TOTAL KNEE ARTHROPLASTY;  Surgeon: Dereck Leep, MD;  Location: [facility] ORS;  Service: Orthopedics;  Laterality: Left;  . NM INTRAVENOUS INJECTION ([facility] HX)     receives biologic infusions every 5 weeks for rheumatoid arthritis. followed by dr. Meda Coffee at [place] [facility]  . TONSILLECTOMY    . UPPER GASTROINTESTINAL ENDOSCOPY    . WISDOM TOOTH EXTRACTION      There were no vitals filed for this visit.  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 Pt had L TKA 08/05/2017. Last 3-4 months pt has been having pain L lateral leg. Pt also states feeling pain in her upper back  including her B shoulders. Mid back pain travels down her spine  to her low back and feel radiating pain down her L lateral leg. R LE seems fine.   Denies loss of bowel or bladder control, or saddle anesthesia.  No falls within the last 6 months.  Low back and L lateral leg pain seems to have increased since onset.     Patient Stated Goals  Walk more comfortably. Stand longer than a minute at church.    Currently in Pain?  Yes    Pain Score  8     Pain Onset  More than a month ago                               PT Education - 12/19/18 1515    Education provided  Yes    Education Details  ther-ex    [employer]) Educated  Patient    Methods  Explanation;Demonstration;Tactile cues;Verbal cues    Comprehension   Verbalized understanding;Returned demonstration      Objective  MedbridgeAccess Code:Access Code: [email or web address]     Therapeutic exercise  Standing position: increased pain L LE  Seated trunk flexion: slight decrease in pain   Seated L lateral shift isometrics (to correct R lateral shift standing posture) in neutral 10x5 seconds for 3 sets  Decreased L LE pain  Seated L hip extension isometrics 10x3 with 5 second holds  Centralized symptoms to the knee L LE  Seated trunk flexion isometrics in neutral, PT manual resistance 10x3 with 5 seconds    Seated B scapular retraction 10x3 with 5 second holds    Seated hip adduction ball and glute max squeeze 10x5secondsfor 3 sets   Improved exercise technique, movement at target joints, use of target muscles after mod verbal, visual, tactile cues.     Therapeutic rest breaks secondary to pt fatigue and to allow for muscle recovery for next exercise     Improved exercise technique, movement at target joints, use of target muscles after mod verbal, visual, tactile cues.    Manual therapy   Seated STM R lumbar paraspinal muscles in the flexed position to decrease tension  Decreased L LE pain   Pt response to treatment Rest breaks provided secondary to fatigueand to allow muscle recovery for next exercise.Good muscle use felt with exercises. Pt tolerated session well without aggravation of symptoms.   Clinical impression Pt arrived to PT today with L LE symptoms along the L5 dermatome. Decreased symptoms with treatment to decrease R lateral shift, improve glute max and trunk strengthening and decrease muscle tension to R lumbar paraspinal muscles. Pt tolerated session well without aggravation of symptoms. Pt will benefit from skilled physical therapy services to decrease pain, improve LE strength and function.      PT Short Term Goals - 08/08/18 1826      PT SHORT TERM GOAL #1   Title  Patient  will be independent with her HEP to improve strength, decrease pain, improve function    Time  3    Period  Weeks    Status  On-going    Target Date  05/23/18        PT Long Term Goals - 11/07/18 1357      PT LONG TERM GOAL #1   Title  Patient will have a decrease in mid to low back pain to 5/10 or less at worst to promote ability to ambulate more comfortably and tolerate standing longer.     Baseline  8/10 back pain at  worst for the past 2 months (possible greater than 8/10 at worst based on pt reports but pt seems to have difficulty with the pain scale; 04/30/2018); 5-6/10 mid to low back pain at most for the past month (08/08/2018); 7/10 back pain at worst for the past 7 days (09/24/2018); 6-7/10 back pain at most for the past 7 days (11/05/2018)    Time  6    Period  Weeks    Status  On-going    Target Date  12/19/18      PT LONG TERM GOAL #2   Title  Pt will have a decrease in L lateral leg pain to 5/10 or less at worst  to promote ability to ambulate more comfortably and tolerate standing longer.     Baseline  10/10 at worst for the past 2 months (04/30/2018); 7-8/10 L lateral leg pain/tingling at worst for the past month (08/08/2018). 7/10 tingling at most for the past 7 days (09/24/2018); 6-7/10 at most for the past 7 days (11/05/2018)    Time  6    Period  Weeks    Status  On-going    Target Date  12/19/18      PT LONG TERM GOAL #3   Title  Patient will have a decrease in L knee joint pain to 5/10 or less at worst  to promote ability to ambulate more comfortably and tolerate standing longer.     Baseline  10/10 L knee pain at worst for the past 2 months (04/30/2018); 8/10 at worst for the past month (08/08/2018); 7-8/10 L knee pain at worst for the past 7 days (09/24/2018); 6-7/10 at worst for the past 7 days (11/05/2018)    Time  6    Period  Weeks    Status  On-going    Target Date  12/19/18      PT LONG TERM GOAL #4   Title  Pt will improve B LE strength by at least 1/2 MMT grade  to  promote ability to ambulate more comfortably and tolerate standing longer.     Time  6    Period  Weeks    Status  Partially Met    Target Date  12/19/18      PT LONG TERM GOAL #5   Title  Pt will report being able to tolerate standing to 5 minutes or more to promote ability to stand at church as well as improve ability to perform chores.     Baseline  Pt able to tolerate standing 1 min per subjective reports. (04/30/2018); half a minute per pt (08/08/2018); 2 minutes 8 seconds with light touch assist (09/24/2018); able to stand 2 min and 50 seconds prior to sitting down (11/05/2018)    Time  6    Period  Weeks    Status  On-going    Target Date  12/19/18      PT LONG TERM GOAL #6   Title  Patient will improve her TUG time using SPC to 15 seconds or less as a demonstration of improved functional mobility and balance.    Baseline  26.3 seconds on average using SPC (09/30/2018); 17 seconds average with SPC (11/05/2018)    Time  6    Period  Weeks    Status  Partially Met    Target Date  12/19/18      PT LONG TERM GOAL #7   Title  Patient will improve her Berg Balance Test score to 40/56 or more as a  demonstration of improved balance and decreased fall risk.    Baseline  33/56 (09/30/2018); 43/56 (11/05/2018)    Time  6    Period  Weeks    Status  Partially Met    Target Date  12/19/18            Plan - 12/19/18 1501    Clinical Impression Statement  Pt arrived to PT today with L LE symptoms along the L5 dermatome. Decreased symptoms with treatment to decrease R lateral shift, improve glute max and trunk strengthening and decrease muscle tension to R lumbar paraspinal muscles. Pt tolerated session well without aggravation of symptoms. Pt will benefit from skilled physical therapy services to decrease pain, improve LE strength and function.    Personal Factors and Comorbidities  Age;Comorbidity 3+;Fitness;Past/Current Experience;Time since onset of injury/illness/exacerbation    Comorbidities   Memory deficit, rheumatoid arthritis, dyspnea, arthritis, B TKA, hx of hysterectomy, myelodysplastic syndrome, arthritis multiple areas    Examination-Activity Limitations  Locomotion Level;Carry;Stairs;Stand    Stability/Clinical Decision Making  Stable/Uncomplicated    Rehab Potential  Fair    Clinical Impairments Affecting Rehab Potential  sedentary lifestyle, age, medical history and chronicity of condition.    PT Frequency  2x / week    PT Duration  6 weeks    PT Treatment/Interventions  Aquatic Therapy;Cryotherapy;Moist Heat;Gait training;Stair training;Functional mobility training;Therapeutic activities;Therapeutic exercise;Patient/family education;Neuromuscular re-education;Balance training;Manual techniques;Taping;Energy conservation;Passive range of motion;Electrical Stimulation;Iontophoresis 61m/ml Dexamethasone;Dry needling    PT Next Visit Plan  Continue with core strengthening, hip strenghtening.    PT Home Exercise Plan  MReaAccess code: [email or web address]   Consulted and Agree with Plan of Care  Patient       Patient will benefit from skilled therapeutic intervention in order to improve the following deficits and impairments:  Abnormal gait, Pain, Postural dysfunction, Decreased strength, Difficulty walking, Improper body mechanics, Decreased balance  Visit Diagnosis: Chronic pain of left knee  Unsteadiness on feet  Difficulty in walking, not elsewhere classified  Muscle weakness (generalized)  Radiculopathy, lumbosacral region  Chronic bilateral low back pain, unspecified whether sciatica present     Problem List Patient Active Problem List   Diagnosis Date Noted  . Protein-calorie malnutrition ([place] 08/27/2017  . 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 Cubital canal compression syndrome, right 04/18/2017  . Primary osteoarthritis of knee 03/25/2017  . 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 Pure hypercholesterolemia 08/17/2016  . MDS  (myelodysplastic syndrome) ([place] 05/12/2016  . B12 deficiency 05/04/2016  . Essential hypertension 05/04/2016  . Mixed stress and urge urinary incontinence 05/04/2016  . Rheumatoid arthritis involving multiple sites with positive rheumatoid factor ([place] 05/04/2016   MJoneen BoersPT, DPT   12/19/2018, 4:00 PM  [facility] [address] [place] [id number]: [phone]  Fax:  [phone] Name: Sylvia NorrodMRN: [id number] of Birth: 308/17/1942

## 2018-12-24 ENCOUNTER — Other Ambulatory Visit: Payer: Self-pay

## 2018-12-24 ENCOUNTER — Ambulatory Visit: Payer: Medicare Other

## 2018-12-24 DIAGNOSIS — R262 Difficulty in walking, not elsewhere classified: Secondary | ICD-10-CM

## 2018-12-24 DIAGNOSIS — M546 Pain in thoracic spine: Secondary | ICD-10-CM

## 2018-12-24 DIAGNOSIS — G8929 Other chronic pain: Secondary | ICD-10-CM

## 2018-12-24 DIAGNOSIS — M25562 Pain in left knee: Secondary | ICD-10-CM

## 2018-12-24 DIAGNOSIS — R2681 Unsteadiness on feet: Secondary | ICD-10-CM

## 2018-12-24 DIAGNOSIS — M5417 Radiculopathy, lumbosacral region: Secondary | ICD-10-CM

## 2018-12-24 DIAGNOSIS — M545 Low back pain, unspecified: Secondary | ICD-10-CM

## 2018-12-24 DIAGNOSIS — M6281 Muscle weakness (generalized): Secondary | ICD-10-CM

## 2018-12-24 NOTE — Therapy (Signed)
[facility] [address] [place], [address] Phone: [phone]   Fax:  [phone]  Physical Therapy Treatment  Patient Details  Name: Sylvia Miller MRN: [id number] Date of Birth: [birth year] Referring Provider (PT): Marlowe Sax, MD   Encounter Date: 12/24/2018  PT End of Session - 12/24/18 1738    Visit Number  37    Number of Visits  61    Date for PT Re-Evaluation  02/06/19    Authorization Type  7    Authorization Time Period  of 10 progress report    PT Start Time  9678    PT Stop Time  9381    PT Time Calculation (min)  47 min    Activity Tolerance  Patient tolerated treatment well;No increased pain;Patient limited by fatigue    Behavior During Therapy  [facility] for tasks assessed/performed       Past Medical History:  Diagnosis Date  . Allergy   . Anemia   . Arthritis    rheumatoid /knees/hands/shoulders (Right shoulder) infusion every 5 weeks  . Bladder incontinence   . Chicken pox   . Dyspnea    with exertion or far walking  . Gastric ulcer   . GERD (gastroesophageal reflux disease)   . Heart murmur   . Hypercholesteremia   . Hyperlipidemia, unspecified   . Hypertension    controlled with meds;   Marland Kitchen MDS (myelodysplastic syndrome) ([place]) 2000  . Memory deficit   . Muscular dystrophy ([place])    per patient, this is incorrect  . Myelodysplastic syndrome ([place])   . Myelodysplastic syndrome ([place])   . Rheumatoid arthritis ([place])   . Ulcer     Past Surgical History:  Procedure Laterality Date  . ABDOMINAL HYSTERECTOMY    . APPENDECTOMY     as a child  . CHOLECYSTECTOMY    . COLONOSCOPY    . COLONOSCOPY WITH PROPOFOL N/A 04/24/2017   Procedure: COLONOSCOPY WITH PROPOFOL;  Surgeon: Lollie Sails, MD;  Location[facility] ENDOSCOPY;  Service: Endoscopy;  Laterality: N/A;  . ESOPHAGOGASTRODUODENOSCOPY (EGD) WITH PROPOFOL N/A 04/24/2017   Procedure: ESOPHAGOGASTRODUODENOSCOPY (EGD)  WITH PROPOFOL;  Surgeon: Lollie Sails, MD;  Location: [facility] ENDOSCOPY;  Service: Endoscopy;  Laterality: N/A;  . JOINT REPLACEMENT    . KNEE ARTHROPLASTY Right 02/21/2017   Procedure: COMPUTER ASSISTED TOTAL KNEE ARTHROPLASTY;  Surgeon: Dereck Leep, MD;  Location: [facility] ORS;  Service: Orthopedics;  Laterality: Right;  . KNEE ARTHROPLASTY Left 08/06/2017   Procedure: COMPUTER ASSISTED TOTAL KNEE ARTHROPLASTY;  Surgeon: Dereck Leep, MD;  Location: [facility] ORS;  Service: Orthopedics;  Laterality: Left;  . NM INTRAVENOUS INJECTION ([facility] HX)     receives biologic infusions every 5 weeks for rheumatoid arthritis. followed by dr. Meda Coffee at [facility]  . TONSILLECTOMY    . UPPER GASTROINTESTINAL ENDOSCOPY    . WISDOM TOOTH EXTRACTION      There were no vitals filed for this visit.  Subjective Assessment - 12/24/18 1740    Subjective  L leg bothers her in the back (sciatic distribution). 6-7/10 L leg pain currently (at at most for the past 7 days). 6/10 L knee pain currently and at worst for the past 7 days.  No back pain currently. Has not done too much. 6-7/10 back pain at most for the past 7 days.    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 Pt had L TKA 08/05/2017. Last 3-4  months pt has been having pain L lateral leg. Pt also states feeling pain in her upper back  including her B shoulders. Mid back pain travels down her spine to her low back and feel radiating pain down her L lateral leg. R LE seems fine.   Denies loss of bowel or bladder control, or saddle anesthesia.  No falls within the last 6 months.  Low back and L lateral leg pain seems to have increased since onset.     Patient Stated Goals  Walk more comfortably. Stand longer than a minute at church.    Pain Onset  More than a month ago         [facility] PT Assessment - 12/24/18 1759      Berg Balance Test   Sit to Stand  Able to stand without using hands and stabilize independently    Standing  Unsupported  Able to stand safely 2 minutes    Sitting with Back Unsupported but Feet Supported on Floor or Stool  Able to sit safely and securely 2 minutes    Stand to Sit  Sits safely with minimal use of hands    Transfers  Able to transfer safely, minor use of hands    Standing Unsupported with Eyes Closed  Able to stand 10 seconds safely    Standing Unsupported with Feet Together  Able to place feet together independently and stand for 1 minute with supervision    From Standing, Reach Forward with Outstretched Arm  Can reach forward >5 cm safely (2")    From Standing Position, Pick up Object from [place] to pick up shoe safely and easily    From Standing Position, Turn to Look Behind Over each Shoulder  Looks behind from both sides and weight shifts well    Turn 360 Degrees  Able to turn 360 degrees safely but slowly    Standing Unsupported, Alternately Place Feet on Step/Stool  Able to complete >2 steps/needs minimal assist    Standing Unsupported, One Foot in Front  Able to plae foot ahead of the other independently and hold 30 seconds    Standing on One Leg  Unable to try or needs assist to prevent fall    Total Score  43    Berg comment:  < 46/56 suggests increased need for AD and increased fall risk   Measured on 11/05/2018                          PT Education - 12/24/18 1750    Education provided  Yes    Education Details  ther-ex    [employer]) Educated  Patient    Methods  Explanation;Demonstration;Tactile cues;Verbal cues    Comprehension  Returned demonstration;Verbalized understanding      Objective  MedbridgeAccess Code:Access Code: [email or web address]    Pain face scale utilized to help pt with pain scores  Therapeutic exercise  Standing up from a chair, walking 10 ft forward, then returning 10 ft, then sitting back onto chair 3x  Without SPC: 19.88 seconds, 16.13 seconds, 16.44 seconds (17.48 seconds independently)  7/10 L knee pain  during activity.   Directed patient with sit <> stand throughout session Stand pivot transfer chair <> low mat table Static standing shoulder width apart, then with eyes closed, then with eyes open feet together, tandem stance with feet shoulder width apart Picking up an object (1 lb dumbell) from the floor,  Turning 360 degrees to the  R and to the L 1x Looking behind to the R and to the L,  Standing forward reach,   Standing alternate toe taps 4 x each LE onto first regular step  Static standing x 6 minutes   Reviewed progress/current status with PT towards goals.    Improved exercise technique, movement at target joints, use of target muscles after mod verbal, visual, tactile cues.    Pt response to treatment Rest breaks provided secondary to fatigueand to allow muscle recovery for next exercise.   Clinical impression Pt able to maintain overall decreased back, L knee and L leg pain levels at worst from previous progress report in the beginning of September. Overall improved levels compared to initial evaluation. Same Berg Balance Test score of 43/56 since last measured in which pt still needs to utilize her [facility] due to fall risk. Pt demonstrates improved ability to perform TUG today, being able to perform the movements without using her SPC at about the same speed as if she was using her AD compared to her previous measurement in September. Pt also better able to tolerate standing, being able to stand for at least 6 minutes compared to her previous time of 2 minutes and 50 seconds. Pt making overall progress with PT towards goals. Pt will benefit from continued skilled physical therapy services to decrease pain, improve LE strength, balance, and ability to perform functional tasks.       PT Short Term Goals - 08/08/18 1826      PT SHORT TERM GOAL #1   Title  Patient will be independent with her HEP to improve strength, decrease pain, improve function    Time  3    Period   Weeks    Status  On-going    Target Date  05/23/18        PT Long Term Goals - 12/24/18 1752      PT LONG TERM GOAL #1   Title  Patient will have a decrease in mid to low back pain to 5/10 or less at worst to promote ability to ambulate more comfortably and tolerate standing longer.     Baseline  8/10 back pain at worst for the past 2 months (possible greater than 8/10 at worst based on pt reports but pt seems to have difficulty with the pain scale; 04/30/2018); 5-6/10 mid to low back pain at most for the past month (08/08/2018); 7/10 back pain at worst for the past 7 days (09/24/2018); 6-7/10 back pain at most for the past 7 days (11/05/2018), (12/24/2018)    Time  6    Period  Weeks    Status  On-going    Target Date  02/06/19      PT LONG TERM GOAL #2   Title  Pt will have a decrease in L lateral leg pain to 5/10 or less at worst  to promote ability to ambulate more comfortably and tolerate standing longer.     Baseline  10/10 at worst for the past 2 months (04/30/2018); 7-8/10 L lateral leg pain/tingling at worst for the past month (08/08/2018). 7/10 tingling at most for the past 7 days (09/24/2018); 6-7/10 at most for the past 7 days (11/05/2018), (12/24/2018)    Time  6    Period  Weeks    Status  On-going    Target Date  02/06/19      PT LONG TERM GOAL #3   Title  Patient will have a decrease in L knee joint pain  to 5/10 or less at worst  to promote ability to ambulate more comfortably and tolerate standing longer.     Baseline  10/10 L knee pain at worst for the past 2 months (04/30/2018); 8/10 at worst for the past month (08/08/2018); 7-8/10 L knee pain at worst for the past 7 days (09/24/2018); 6-7/10 at worst for the past 7 days (11/05/2018), (12/24/2018)    Time  6    Period  Weeks    Status  On-going    Target Date  02/06/19      PT LONG TERM GOAL #4   Title  Pt will improve B LE strength by at least 1/2 MMT grade  to promote ability to ambulate more comfortably and tolerate standing  longer.     Time  6    Period  Weeks    Status  Partially Met    Target Date  02/06/19      PT LONG TERM GOAL #5   Title  Pt will report being able to tolerate standing to 5 minutes or more to promote ability to stand at church as well as improve ability to perform chores.     Baseline  Pt able to tolerate standing 1 min per subjective reports. (04/30/2018); half a minute per pt (08/08/2018); 2 minutes 8 seconds with light touch assist (09/24/2018); able to stand 2 min and 50 seconds prior to sitting down (11/05/2018). Able to stand at least 6 minutes (12/2018)    Time  6    Period  Weeks    Status  Achieved    Target Date  12/19/18      PT LONG TERM GOAL #6   Title  Patient will improve her TUG time using SPC to 15 seconds or less as a demonstration of improved functional mobility and balance.    Baseline  26.3 seconds on average using SPC (09/30/2018); 17 seconds average with SPC (11/05/2018); 17.48 seconds without the SPC (12/24/2018)    Time  6    Period  Weeks    Status  Partially Met    Target Date  02/06/19      PT LONG TERM GOAL #7   Title  Patient will improve her Berg Balance Test score to 40/56 or more as a demonstration of improved balance and decreased fall risk.    Baseline  33/56 (09/30/2018); 43/56 (11/05/2018), (12/24/18)    Time  6    Period  Weeks    Status  Partially Met    Target Date  02/06/19            Plan - 12/24/18 1800    Clinical Impression Statement  Pt able to maintain overall decreased back, L knee and L leg pain levels at worst from previous progress report in the beginning of September. Overall improved levels compared to initial evaluation. Same Berg Balance Test score of 43/56 since last measured in which pt still needs to utilize her [facility] due to fall risk. Pt demonstrates improved ability to perform TUG today, being able to perform the movements without using her SPC at about the same speed as if she was using her AD compared to her previous measurement  in September. Pt also better able to tolerate standing, being able to stand for at least 6 minutes compared to her previous time of 2 minutes and 50 seconds. Pt making overall progress with PT towards goals. Pt will benefit from continued skilled physical therapy services to decrease pain, improve LE strength,  balance, and ability to perform functional tasks.    Personal Factors and Comorbidities  Age;Comorbidity 3+;Fitness;Past/Current Experience;Time since onset of injury/illness/exacerbation    Comorbidities  Memory deficit, rheumatoid arthritis, dyspnea, arthritis, B TKA, hx of hysterectomy, myelodysplastic syndrome, arthritis multiple areas    Examination-Activity Limitations  Locomotion Level;Carry;Stairs;Stand    Stability/Clinical Decision Making  Stable/Uncomplicated    Clinical Decision Making  Low    Clinical Presentation due to:  Pt making progress towards goals    Rehab Potential  Fair    Clinical Impairments Affecting Rehab Potential  sedentary lifestyle, age, medical history and chronicity of condition.    PT Frequency  2x / week    PT Duration  6 weeks    PT Treatment/Interventions  Aquatic Therapy;Cryotherapy;Moist Heat;Gait training;Stair training;Functional mobility training;Therapeutic activities;Therapeutic exercise;Patient/family education;Neuromuscular re-education;Balance training;Manual techniques;Taping;Energy conservation;Passive range of motion;Electrical Stimulation;Iontophoresis 17m/ml Dexamethasone;Dry needling    PT Next Visit Plan  Continue with core strengthening, hip strenghtening.    PT Home Exercise Plan  [email or web address] code: [email or web address]   Consulted and Agree with Plan of Care  Patient       Patient will benefit from skilled therapeutic intervention in order to improve the following deficits and impairments:  Abnormal gait, Pain, Postural dysfunction, Decreased strength, Difficulty walking, Improper body mechanics, Decreased balance  Visit Diagnosis: Chronic  pain of left knee - Plan: PT plan of care cert/re-cert  Unsteadiness on feet - Plan: PT plan of care cert/re-cert  Difficulty in walking, not elsewhere classified - Plan: PT plan of care cert/re-cert  Chronic bilateral low back pain, unspecified whether sciatica present - Plan: PT plan of care cert/re-cert  Radiculopathy, lumbosacral region - Plan: PT plan of care cert/re-cert  Muscle weakness (generalized) - Plan: PT plan of care cert/re-cert  Pain in thoracic spine - Plan: PT plan of care cert/re-cert     Problem List Patient Active Problem List   Diagnosis Date Noted  . Protein-calorie malnutrition ([place] 08/27/2017  . 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 Cubital canal compression syndrome, right 04/18/2017  . Primary osteoarthritis of knee 03/25/2017  . 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 Pure hypercholesterolemia 08/17/2016  . MDS (myelodysplastic syndrome) ([place] 05/12/2016  . B12 deficiency 05/04/2016  . Essential hypertension 05/04/2016  . Mixed stress and urge urinary incontinence 05/04/2016  . Rheumatoid arthritis involving multiple sites with positive rheumatoid factor ([place] 05/04/2016    MJoneen BoersPT, DPT   12/24/2018, 6:47 PM  [facility] [address] [place] [address]Phone: [phone]  Fax:  [phone] Name: Sylvia McgeehanMRN: [id number] of Birth: 3May 05, 1942

## 2018-12-25 IMAGING — CT CT CHEST W/O CM
2 of 3 series · 15 of 36 positions shown, 18 images · non-contrast
Comparison: Report from the radiographs dated 12/05/2017 describes
increased density along the right paratracheal region.

CLINICAL DATA: No surgery. NKI. No hx of CA. Pt is non-smoker. No
current symptoms per pt. Per pt she had area of concern show up on
CXR while at [REDACTED].

EXAM:
CT CHEST WITHOUT CONTRAST
TECHNIQUE: Multidetector CT imaging of the chest was performed following the
standard protocol without IV contrast.

[Series 2: thorax · axial · 0.61mm/px · z∈[-294,-64]mm · 12 of 135 slices shown, 15 images]
[im 10/135  mediastinal]
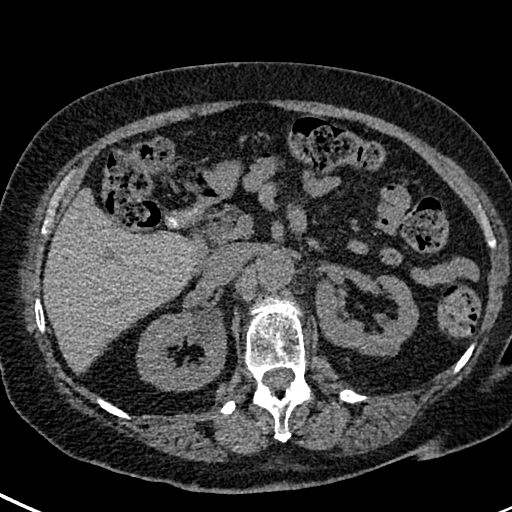
[im 10/135  lung]
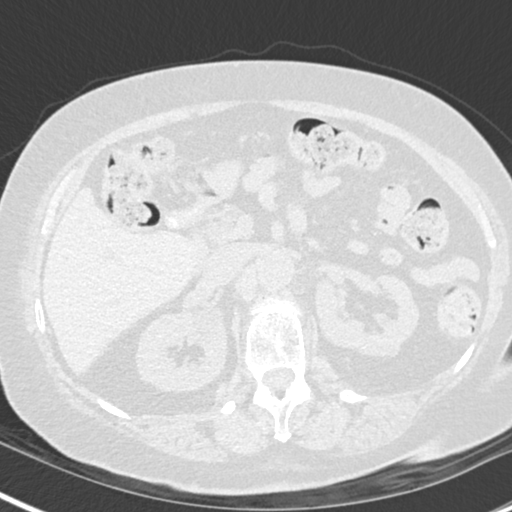
[im 20/135  lung]
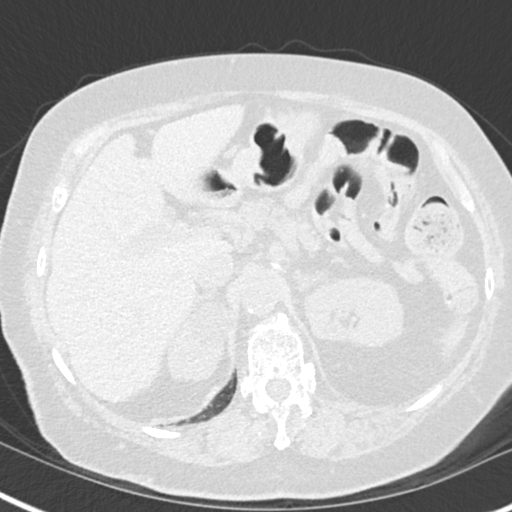
[im 30/135  lung]
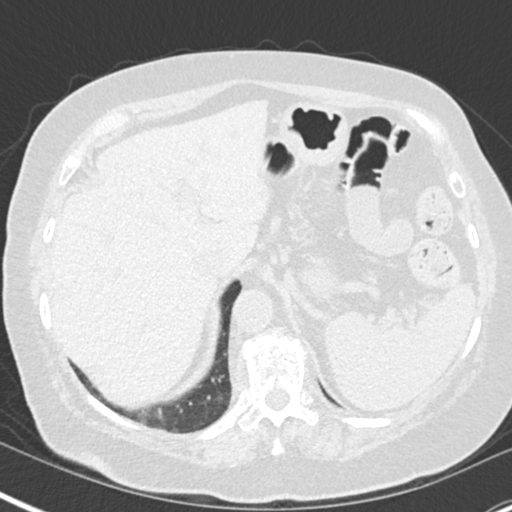
[im 40/135  lung]
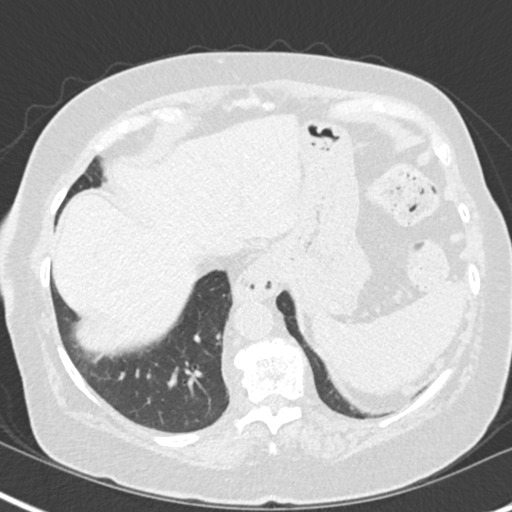
[im 50/135  mediastinal]
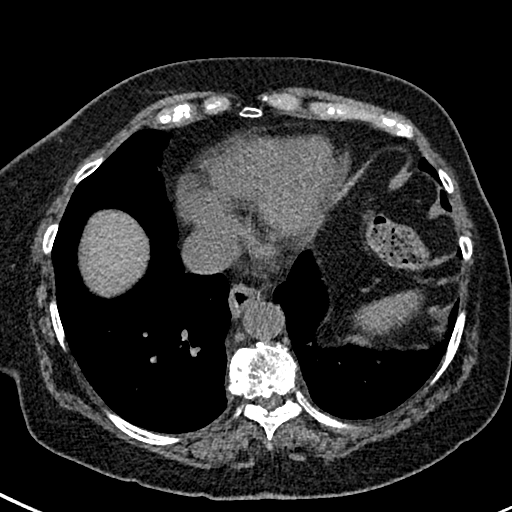
[im 50/135  lung]
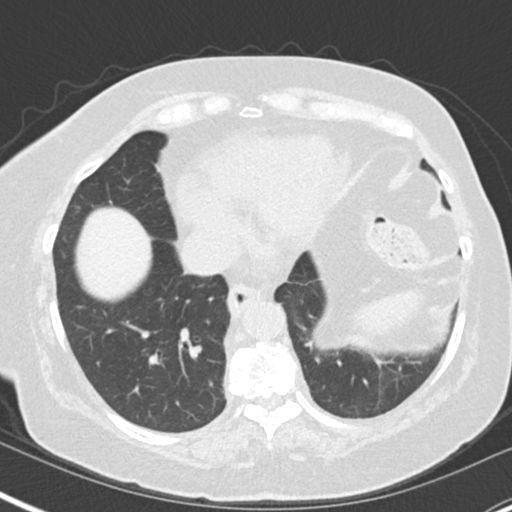
[im 60/135  lung]
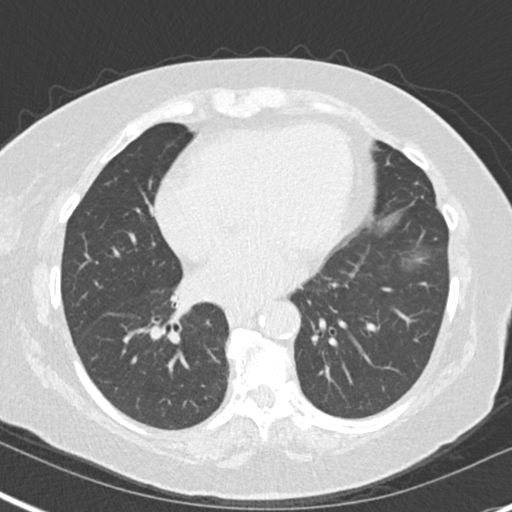
[im 75/135  lung]
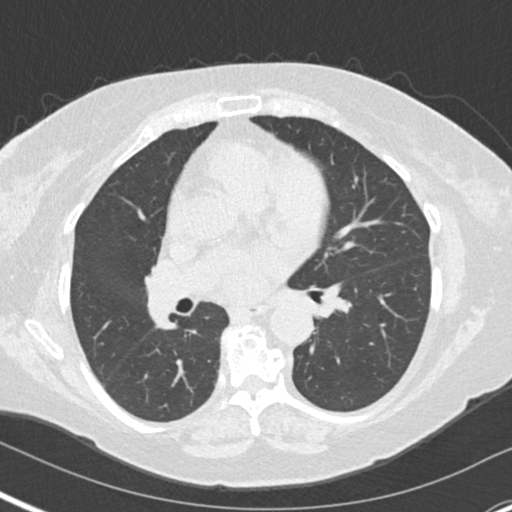
[im 85/135  lung]
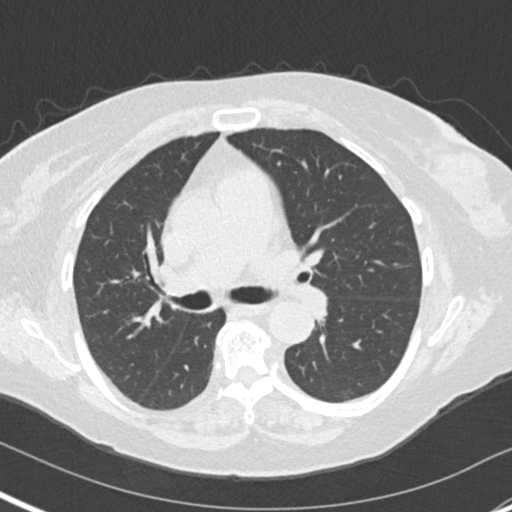
[im 95/135  mediastinal]
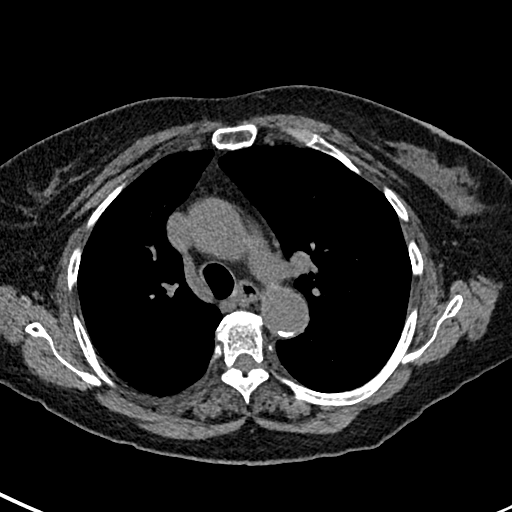
[im 95/135  lung]
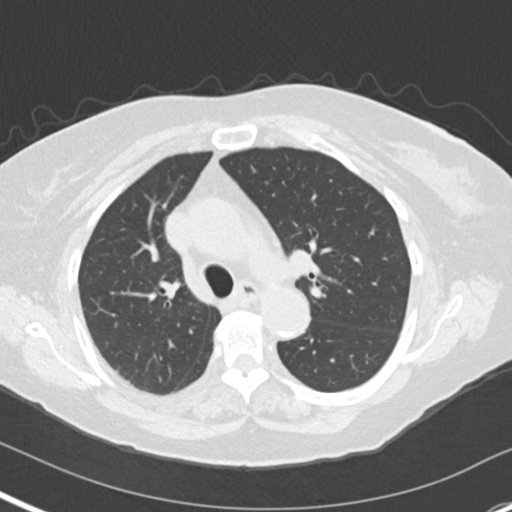
[im 105/135  lung]
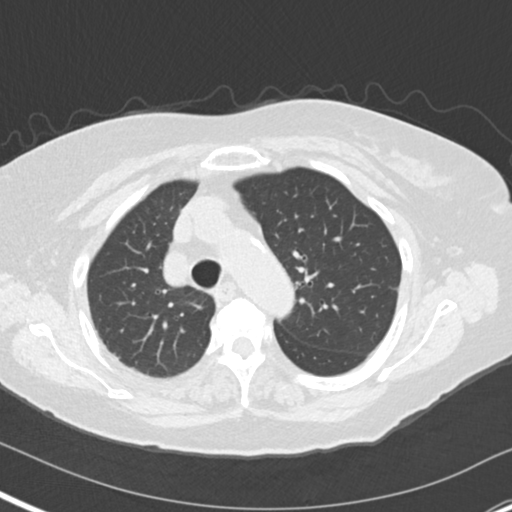
[im 115/135  lung]
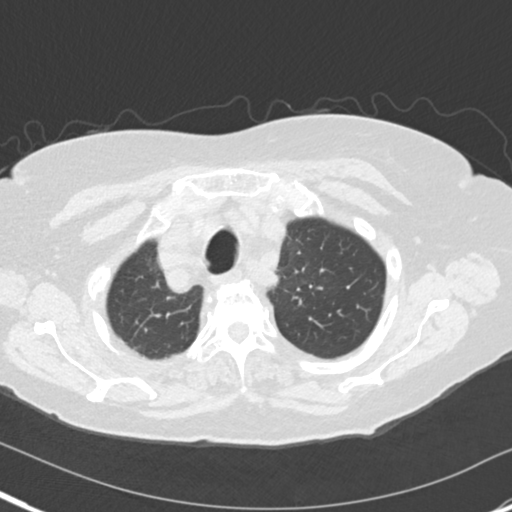
[im 125/135  lung]
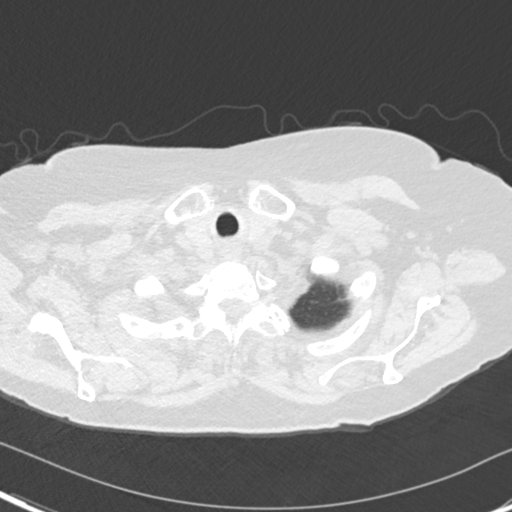

[Series 5: coronal · coronal · 0.57mm/px · 3 of 123 slices shown]
[im 25/123  lung]
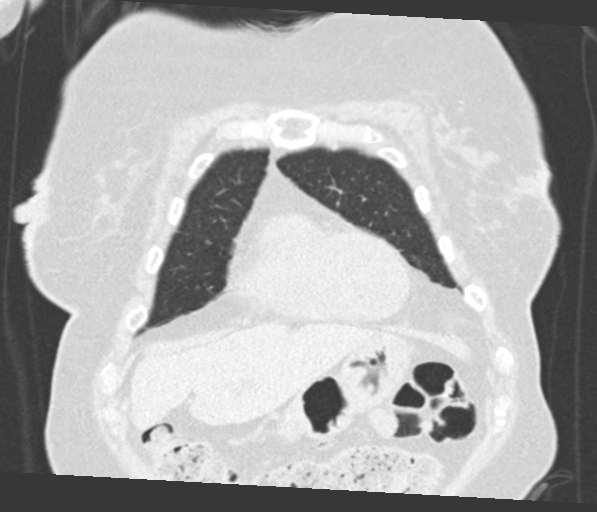
[im 49/123  lung]
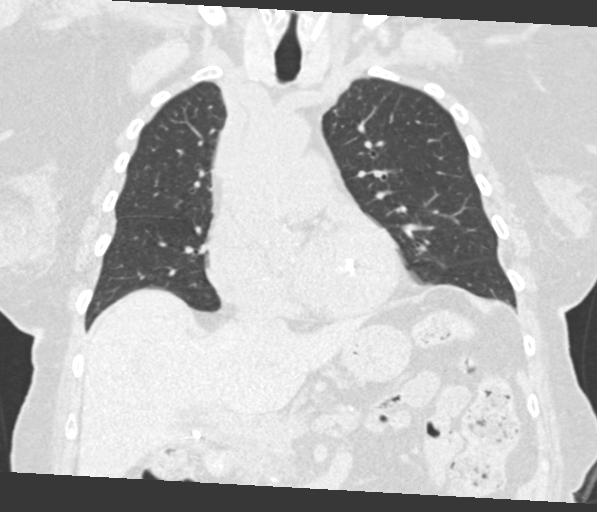
[im 74/123  lung]
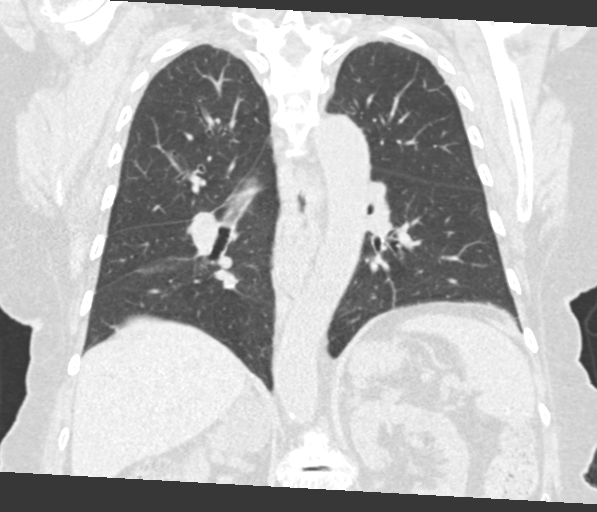

[15 of 36 positions shown; findings below may reference images not displayed]

FINDINGS: Cardiovascular: Heart is normal in size and configuration. No
pericardial effusion. No coronary artery calcifications. Great
vessels are normal in caliber. Mild aortic atherosclerosis.

Mediastinum/Nodes: Right superior mediastinal structures bulge into
the medial upper right hemithorax. This is due to tortuous vessels
and associated fat. There is no mass or adenopathy.

No neck base or axillary masses or adenopathy. No mediastinal
masses. No pathologically enlarged lymph nodes. Trachea and
esophagus are unremarkable.

Lungs/Pleura: No lung mass or nodule. No pneumonia or pulmonary
edema. Minor subsegmental atelectasis the bases. No pleural effusion
or pneumothorax.

Upper Abdomen: No acute abnormality.

Musculoskeletal: No fracture or acute finding. No osteoblastic or
osteolytic lesions.
IMPRESSION: 1. No acute findings.
2. The prominent right upper paratracheal stripe reported on the
prior chest radiograph is due to tortuous vessels and associated
mediastinal fat. No mass or adenopathy.
3. Mild aortic atherosclerosis.

Aortic Atherosclerosis ([id number]).

## 2018-12-26 ENCOUNTER — Ambulatory Visit: Payer: Medicare Other

## 2018-12-26 ENCOUNTER — Other Ambulatory Visit: Payer: Self-pay

## 2018-12-26 DIAGNOSIS — G8929 Other chronic pain: Secondary | ICD-10-CM

## 2018-12-26 DIAGNOSIS — M545 Low back pain, unspecified: Secondary | ICD-10-CM

## 2018-12-26 DIAGNOSIS — M25562 Pain in left knee: Secondary | ICD-10-CM | POA: Diagnosis not present

## 2018-12-26 DIAGNOSIS — R262 Difficulty in walking, not elsewhere classified: Secondary | ICD-10-CM

## 2018-12-26 DIAGNOSIS — R2681 Unsteadiness on feet: Secondary | ICD-10-CM

## 2018-12-26 DIAGNOSIS — M6281 Muscle weakness (generalized): Secondary | ICD-10-CM

## 2018-12-26 DIAGNOSIS — M546 Pain in thoracic spine: Secondary | ICD-10-CM

## 2018-12-26 DIAGNOSIS — M5417 Radiculopathy, lumbosacral region: Secondary | ICD-10-CM

## 2018-12-26 NOTE — Therapy (Signed)
[facility] [address] [place], [address] Phone: [phone]   Fax:  [phone]  Physical Therapy Treatment  Patient Details  Name: Sylvia Miller MRN: [id number] Date of Birth: 1941/01/31 Referring Provider (PT): Marlowe Sax, MD   Encounter Date: 12/26/2018  PT End of Session - 12/26/18 1304    Visit Number  38    Number of Visits  61    Date for PT Re-Evaluation  02/06/19    Authorization Type  8    Authorization Time Period  of 10 progress report    PT Start Time  1304    PT Stop Time  1346    PT Time Calculation (min)  42 min    Activity Tolerance  Patient tolerated treatment well;No increased pain;Patient limited by fatigue    Behavior During Therapy  [facility] for tasks assessed/performed       Past Medical History:  Diagnosis Date  . Allergy   . Anemia   . Arthritis    rheumatoid /knees/hands/shoulders (Right shoulder) infusion every 5 weeks  . Bladder incontinence   . Chicken pox   . Dyspnea    with exertion or far walking  . Gastric ulcer   . GERD (gastroesophageal reflux disease)   . Heart murmur   . Hypercholesteremia   . Hyperlipidemia, unspecified   . Hypertension    controlled with meds;   Marland Kitchen MDS (myelodysplastic syndrome) ([place]) 2000  . Memory deficit   . Muscular dystrophy ([place])    per patient, this is incorrect  . Myelodysplastic syndrome ([place])   . Myelodysplastic syndrome ([place])   . Rheumatoid arthritis ([place])   . Ulcer     Past Surgical History:  Procedure Laterality Date  . ABDOMINAL HYSTERECTOMY    . APPENDECTOMY     as a child  . CHOLECYSTECTOMY    . COLONOSCOPY    . COLONOSCOPY WITH PROPOFOL N/A 04/24/2017   Procedure: COLONOSCOPY WITH PROPOFOL;  Surgeon: Lollie Sails, MD;  Location: [facility] ENDOSCOPY;  Service: Endoscopy;  Laterality: N/A;  . ESOPHAGOGASTRODUODENOSCOPY (EGD) WITH PROPOFOL N/A 04/24/2017   Procedure: ESOPHAGOGASTRODUODENOSCOPY (EGD)  WITH PROPOFOL;  Surgeon: Lollie Sails, MD;  Location: [facility] ENDOSCOPY;  Service: Endoscopy;  Laterality: N/A;  . JOINT REPLACEMENT    . KNEE ARTHROPLASTY Right 02/21/2017   Procedure: COMPUTER ASSISTED TOTAL KNEE ARTHROPLASTY;  Surgeon: Dereck Leep, MD;  Location: [facility] ORS;  Service: Orthopedics;  Laterality: Right;  . KNEE ARTHROPLASTY Left 08/06/2017   Procedure: COMPUTER ASSISTED TOTAL KNEE ARTHROPLASTY;  Surgeon: Dereck Leep, MD;  Location: [facility] ORS;  Service: Orthopedics;  Laterality: Left;  . NM INTRAVENOUS INJECTION ([facility] HX)     receives biologic infusions every 5 weeks for rheumatoid arthritis. followed by dr. Meda Coffee at [facility]  . TONSILLECTOMY    . UPPER GASTROINTESTINAL ENDOSCOPY    . WISDOM TOOTH EXTRACTION      There were no vitals filed for this visit.  Subjective Assessment - 12/26/18 1305    Subjective  Knees are kind of sore. Goes down the back of her leg.    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 Pt had L TKA 08/05/2017. Last 3-4 months pt has been having pain L lateral leg. Pt also states feeling pain in her upper back  including her B shoulders. Mid back pain travels down her spine to her low back and feel radiating pain down her L lateral leg.  R LE seems fine.   Denies loss of bowel or bladder control, or saddle anesthesia.  No falls within the last 6 months.  Low back and L lateral leg pain seems to have increased since onset.     Patient Stated Goals  Walk more comfortably. Stand longer than a minute at church.    Currently in Pain?  Yes    Pain Score  7     Pain Onset  More than a month ago                               PT Education - 12/26/18 1313    Education provided  Yes    Education Details  ther-ex    [employer]) Educated  Patient    Methods  Explanation;Demonstration;Tactile cues;Verbal cues    Comprehension  Returned demonstration;Verbalized understanding        Objective  MedbridgeAccess Code:Access Code: [email or web address]    Pain face scale utilized to help pt with pain scores  Therapeutic exercise  Standing mini squats 5x  L posterior hamstring discomfort  Seated L lateral shift isometrics (to correct R lateral shift standing posture) in neutral 10x5 seconds for 3 sets           Seated L hip extension isometrics 10x3 with 5 second holds             Seated B scapular retraction 10x3 with 5 second holds to promote thoracic extension to decrease low back stress.   Seated trunk flexion isometrics in neutral, PT manual resistance 10x3 with 5 seconds    Seated gentle manually resisted trunk extension isometrics in neutral 10x5 seconds for 3 sets   Seatedmanually resistedclamshell isometrics, hips less than 90 degrees flexion 10x5 seconds for 3 sets to promote glute med muscle strengthening    Seated hip adduction ball and glute max squeeze 10x5secondsfor 3 sets    Improved exercise technique, movement at target joints, use of target muscles after mod verbal, visual, tactile cues.    Pt response to treatment Rest breaks provided secondary to fatigueand to allow muscle recovery for next exercise.Pt tolearted session without aggravation of symptoms.    Clinical impression Worked on trunk and glute muscle strengthening as well as gentle thoracic extension today secondary to possible low back involvement with posterior LE symptoms. Pt tolerated session well without aggravation of symptoms. Pt will benefit from continued skilled physical therapy services to decrease pain, improve strength, function, and decrease difficulty performing standing tasks.      PT Short Term Goals - 08/08/18 1826      PT SHORT TERM GOAL #1   Title  Patient will be independent with her HEP to improve strength, decrease pain, improve function    Time  3    Period  Weeks    Status  On-going    Target Date  05/23/18        PT  Long Term Goals - 12/24/18 1752      PT LONG TERM GOAL #1   Title  Patient will have a decrease in mid to low back pain to 5/10 or less at worst to promote ability to ambulate more comfortably and tolerate standing longer.     Baseline  8/10 back pain at worst for the past 2 months (possible greater than 8/10 at worst based on pt reports but pt seems to have difficulty with the pain scale; 04/30/2018); 5-6/10 mid to low back  pain at most for the past month (08/08/2018); 7/10 back pain at worst for the past 7 days (09/24/2018); 6-7/10 back pain at most for the past 7 days (11/05/2018), (12/24/2018)    Time  6    Period  Weeks    Status  On-going    Target Date  02/06/19      PT LONG TERM GOAL #2   Title  Pt will have a decrease in L lateral leg pain to 5/10 or less at worst  to promote ability to ambulate more comfortably and tolerate standing longer.     Baseline  10/10 at worst for the past 2 months (04/30/2018); 7-8/10 L lateral leg pain/tingling at worst for the past month (08/08/2018). 7/10 tingling at most for the past 7 days (09/24/2018); 6-7/10 at most for the past 7 days (11/05/2018), (12/24/2018)    Time  6    Period  Weeks    Status  On-going    Target Date  02/06/19      PT LONG TERM GOAL #3   Title  Patient will have a decrease in L knee joint pain to 5/10 or less at worst  to promote ability to ambulate more comfortably and tolerate standing longer.     Baseline  10/10 L knee pain at worst for the past 2 months (04/30/2018); 8/10 at worst for the past month (08/08/2018); 7-8/10 L knee pain at worst for the past 7 days (09/24/2018); 6-7/10 at worst for the past 7 days (11/05/2018), (12/24/2018)    Time  6    Period  Weeks    Status  On-going    Target Date  02/06/19      PT LONG TERM GOAL #4   Title  Pt will improve B LE strength by at least 1/2 MMT grade  to promote ability to ambulate more comfortably and tolerate standing longer.     Time  6    Period  Weeks    Status  Partially Met     Target Date  02/06/19      PT LONG TERM GOAL #5   Title  Pt will report being able to tolerate standing to 5 minutes or more to promote ability to stand at church as well as improve ability to perform chores.     Baseline  Pt able to tolerate standing 1 min per subjective reports. (04/30/2018); half a minute per pt (08/08/2018); 2 minutes 8 seconds with light touch assist (09/24/2018); able to stand 2 min and 50 seconds prior to sitting down (11/05/2018). Able to stand at least 6 minutes (12/2018)    Time  6    Period  Weeks    Status  Achieved    Target Date  12/19/18      PT LONG TERM GOAL #6   Title  Patient will improve her TUG time using SPC to 15 seconds or less as a demonstration of improved functional mobility and balance.    Baseline  26.3 seconds on average using SPC (09/30/2018); 17 seconds average with SPC (11/05/2018); 17.48 seconds without the SPC (12/24/2018)    Time  6    Period  Weeks    Status  Partially Met    Target Date  02/06/19      PT LONG TERM GOAL #7   Title  Patient will improve her Berg Balance Test score to 40/56 or more as a demonstration of improved balance and decreased fall risk.    Baseline  33/56 (09/30/2018);  43/56 (11/05/2018), (12/24/18)    Time  6    Period  Weeks    Status  Partially Met    Target Date  02/06/19            Plan - 12/26/18 1258    Clinical Impression Statement  Worked on trunk and glute muscle strengthening as well as gentle thoracic extension today secondary to possible low back involvement with posterior LE symptoms today. Pt tolerated session well without aggravation of symptoms. Pt will benefit from continued skilled physical therapy services to decrease pain, improve strength, function, and decrease difficulty performing standing tasks.    Personal Factors and Comorbidities  Age;Comorbidity 3+;Fitness;Past/Current Experience;Time since onset of injury/illness/exacerbation    Comorbidities  Memory deficit, rheumatoid arthritis,  dyspnea, arthritis, B TKA, hx of hysterectomy, myelodysplastic syndrome, arthritis multiple areas    Examination-Activity Limitations  Locomotion Level;Carry;Stairs;Stand    Stability/Clinical Decision Making  Stable/Uncomplicated    Rehab Potential  Fair    Clinical Impairments Affecting Rehab Potential  sedentary lifestyle, age, medical history and chronicity of condition.    PT Frequency  2x / week    PT Duration  6 weeks    PT Treatment/Interventions  Aquatic Therapy;Cryotherapy;Moist Heat;Gait training;Stair training;Functional mobility training;Therapeutic activities;Therapeutic exercise;Patient/family education;Neuromuscular re-education;Balance training;Manual techniques;Taping;Energy conservation;Passive range of motion;Electrical Stimulation;Iontophoresis 68m/ml Dexamethasone;Dry needling    PT Next Visit Plan  Continue with core strengthening, hip strenghtening.    PT HHartsvilleAccess code: [email or web address]   Consulted and Agree with Plan of Care  Patient       Patient will benefit from skilled therapeutic intervention in order to improve the following deficits and impairments:  Abnormal gait, Pain, Postural dysfunction, Decreased strength, Difficulty walking, Improper body mechanics, Decreased balance  Visit Diagnosis: Chronic pain of left knee  Unsteadiness on feet  Difficulty in walking, not elsewhere classified  Chronic bilateral low back pain, unspecified whether sciatica present  Radiculopathy, lumbosacral region  Muscle weakness (generalized)  Pain in thoracic spine     Problem List Patient Active Problem List   Diagnosis Date Noted  . Protein-calorie malnutrition ([place] 08/27/2017  . 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 Cubital canal compression syndrome, right 04/18/2017  . Primary osteoarthritis of knee 03/25/2017  . 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 Pure hypercholesterolemia 08/17/2016  . MDS (myelodysplastic  syndrome) ([place] 05/12/2016  . B12 deficiency 05/04/2016  . Essential hypertension 05/04/2016  . Mixed stress and urge urinary incontinence 05/04/2016  . Rheumatoid arthritis involving multiple sites with positive rheumatoid factor ([place] 05/04/2016    MJoneen BoersPT, DPT   12/26/2018, 6:20 PM  [facility] [address] [place] [address]Phone: [phone]  Fax:  [phone] Name: JSheniah SupakMRN: [id number] of Birth: 3Apr 26, 1942

## 2018-12-31 ENCOUNTER — Other Ambulatory Visit: Payer: Self-pay

## 2018-12-31 ENCOUNTER — Ambulatory Visit: Payer: Medicare Other

## 2018-12-31 DIAGNOSIS — M545 Low back pain, unspecified: Secondary | ICD-10-CM

## 2018-12-31 DIAGNOSIS — M5417 Radiculopathy, lumbosacral region: Secondary | ICD-10-CM

## 2018-12-31 DIAGNOSIS — M6281 Muscle weakness (generalized): Secondary | ICD-10-CM

## 2018-12-31 DIAGNOSIS — M25562 Pain in left knee: Secondary | ICD-10-CM

## 2018-12-31 DIAGNOSIS — M546 Pain in thoracic spine: Secondary | ICD-10-CM

## 2018-12-31 DIAGNOSIS — R2681 Unsteadiness on feet: Secondary | ICD-10-CM

## 2018-12-31 DIAGNOSIS — G8929 Other chronic pain: Secondary | ICD-10-CM

## 2018-12-31 DIAGNOSIS — R262 Difficulty in walking, not elsewhere classified: Secondary | ICD-10-CM

## 2018-12-31 NOTE — Therapy (Signed)
[facility] [address] [place], [address] Phone: [phone]   Fax:  [phone]  Physical Therapy Treatment  Patient Details  Name: Sylvia Miller MRN: [id number] Date of Birth: 1940/03/12 Referring Provider (PT): Marlowe Sax, MD   Encounter Date: 12/31/2018  PT End of Session - 12/31/18 1302    Visit Number  39    Number of Visits  61    Date for PT Re-Evaluation  02/06/19    Authorization Type  9    Authorization Time Period  of 10 progress report    PT Start Time  1320   pt went to restroom prior to PT for 21+ minutes   PT Stop Time  1345    PT Time Calculation (min)  25 min    Activity Tolerance  Patient tolerated treatment well;No increased pain;Patient limited by fatigue    Behavior During Therapy  [facility] for tasks assessed/performed       Past Medical History:  Diagnosis Date  . Allergy   . Anemia   . Arthritis    rheumatoid /knees/hands/shoulders (Right shoulder) infusion every 5 weeks  . Bladder incontinence   . Chicken pox   . Dyspnea    with exertion or far walking  . Gastric ulcer   . GERD (gastroesophageal reflux disease)   . Heart murmur   . Hypercholesteremia   . Hyperlipidemia, unspecified   . Hypertension    controlled with meds;   Marland Kitchen MDS (myelodysplastic syndrome) ([place]) 2000  . Memory deficit   . Muscular dystrophy ([place])    per patient, this is incorrect  . Myelodysplastic syndrome ([place])   . Myelodysplastic syndrome ([place])   . Rheumatoid arthritis ([place])   . Ulcer     Past Surgical History:  Procedure Laterality Date  . ABDOMINAL HYSTERECTOMY    . APPENDECTOMY     as a child  . CHOLECYSTECTOMY    . COLONOSCOPY    . COLONOSCOPY WITH PROPOFOL N/A 04/24/2017   Procedure: COLONOSCOPY WITH PROPOFOL;  Surgeon: Lollie Sails, MD;  Location: [facility] ENDOSCOPY;  Service: Endoscopy;  Laterality: N/A;  . ESOPHAGOGASTRODUODENOSCOPY (EGD) WITH PROPOFOL N/A 04/24/2017    Procedure: ESOPHAGOGASTRODUODENOSCOPY (EGD) WITH PROPOFOL;  Surgeon: Lollie Sails, MD;  Location: [facility] ENDOSCOPY;  Service: Endoscopy;  Laterality: N/A;  . JOINT REPLACEMENT    . KNEE ARTHROPLASTY Right 02/21/2017   Procedure: COMPUTER ASSISTED TOTAL KNEE ARTHROPLASTY;  Surgeon: Dereck Leep, MD;  Location: [facility] ORS;  Service: Orthopedics;  Laterality: Right;  . KNEE ARTHROPLASTY Left 08/06/2017   Procedure: COMPUTER ASSISTED TOTAL KNEE ARTHROPLASTY;  Surgeon: Dereck Leep, MD;  Location: [facility] ORS;  Service: Orthopedics;  Laterality: Left;  . NM INTRAVENOUS INJECTION ([facility] HX)     receives biologic infusions every 5 weeks for rheumatoid arthritis. followed by dr. Meda Coffee at [facility]  . TONSILLECTOMY    . UPPER GASTROINTESTINAL ENDOSCOPY    . WISDOM TOOTH EXTRACTION      There were no vitals filed for this visit.  Subjective Assessment - 12/31/18 1321    Subjective  L knee is not great. Back is not doing great but not causing her too many problems at the mooment.    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 Pt had L TKA 08/05/2017. Last 3-4 months pt has been having pain L lateral leg. Pt also states feeling pain in her upper back  including her B shoulders. Mid  back pain travels down her spine to her low back and feel radiating pain down her L lateral leg. R LE seems fine.   Denies loss of bowel or bladder control, or saddle anesthesia.  No falls within the last 6 months.  Low back and L lateral leg pain seems to have increased since onset.     Patient Stated Goals  Walk more comfortably. Stand longer than a minute at church.    Currently in Pain?  Other (Comment)   no pain level provided.   Pain Onset  More than a month ago                               PT Education - 12/31/18 1336    Education provided  Yes    Education Details  ther-ex    [employer]) Educated  Patient    Methods  Explanation;Demonstration;Tactile  cues;Verbal cues    Comprehension  Returned demonstration;Verbalized understanding       Objective  MedbridgeAccess Code:Access Code: [email or web address]    Pain face scale utilized to help pt with pain scores  Manual therapy Seated STM L latearl hamstrings, IT band, vastus lateralis Seated myofascial release L lateral knee to help decrease tightness  Therapeutic exercise  Forward step ups onto Air Ex pad with L LE and B UE assist  10x2  Lateral step up onto Air Ex pad with B UE assist 10x  Standing hip abduction with B UE assist to promote glute med muscle strengthening.   L 10x2  R 10x2   Sit <> stand without UE assist 5x     Improved exercise technique, movement at target joints, use of target muscles after mod verbal, visual, tactile cues.      Pt response to treatment Rest breaks provided secondary to fatigueand to allow muscle recovery for next exercise.Pt tolearted session without aggravation of symptoms.    Clinical impression Continued working on decreasing L lateral knee soft tissue tightness (helps per pt), as well as improving L glute and quadriceps strengthening to promote stability and better mechanics at her L knee joint. Pt will benefit from continued skilled physical therapy services to decrease pain, improve strength, function, and ability to ambulate more comfortably. No pain in L knee with gait with SPC after session.      PT Short Term Goals - 08/08/18 1826      PT SHORT TERM GOAL #1   Title  Patient will be independent with her HEP to improve strength, decrease pain, improve function    Time  3    Period  Weeks    Status  On-going    Target Date  05/23/18        PT Long Term Goals - 12/24/18 1752      PT LONG TERM GOAL #1   Title  Patient will have a decrease in mid to low back pain to 5/10 or less at worst to promote ability to ambulate more comfortably and tolerate standing longer.     Baseline  8/10 back pain at worst  for the past 2 months (possible greater than 8/10 at worst based on pt reports but pt seems to have difficulty with the pain scale; 04/30/2018); 5-6/10 mid to low back pain at most for the past month (08/08/2018); 7/10 back pain at worst for the past 7 days (09/24/2018); 6-7/10 back pain at most for the past 7 days (11/05/2018), (12/24/2018)  Time  6    Period  Weeks    Status  On-going    Target Date  02/06/19      PT LONG TERM GOAL #2   Title  Pt will have a decrease in L lateral leg pain to 5/10 or less at worst  to promote ability to ambulate more comfortably and tolerate standing longer.     Baseline  10/10 at worst for the past 2 months (04/30/2018); 7-8/10 L lateral leg pain/tingling at worst for the past month (08/08/2018). 7/10 tingling at most for the past 7 days (09/24/2018); 6-7/10 at most for the past 7 days (11/05/2018), (12/24/2018)    Time  6    Period  Weeks    Status  On-going    Target Date  02/06/19      PT LONG TERM GOAL #3   Title  Patient will have a decrease in L knee joint pain to 5/10 or less at worst  to promote ability to ambulate more comfortably and tolerate standing longer.     Baseline  10/10 L knee pain at worst for the past 2 months (04/30/2018); 8/10 at worst for the past month (08/08/2018); 7-8/10 L knee pain at worst for the past 7 days (09/24/2018); 6-7/10 at worst for the past 7 days (11/05/2018), (12/24/2018)    Time  6    Period  Weeks    Status  On-going    Target Date  02/06/19      PT LONG TERM GOAL #4   Title  Pt will improve B LE strength by at least 1/2 MMT grade  to promote ability to ambulate more comfortably and tolerate standing longer.     Time  6    Period  Weeks    Status  Partially Met    Target Date  02/06/19      PT LONG TERM GOAL #5   Title  Pt will report being able to tolerate standing to 5 minutes or more to promote ability to stand at church as well as improve ability to perform chores.     Baseline  Pt able to tolerate standing 1 min per  subjective reports. (04/30/2018); half a minute per pt (08/08/2018); 2 minutes 8 seconds with light touch assist (09/24/2018); able to stand 2 min and 50 seconds prior to sitting down (11/05/2018). Able to stand at least 6 minutes (12/2018)    Time  6    Period  Weeks    Status  Achieved    Target Date  12/19/18      PT LONG TERM GOAL #6   Title  Patient will improve her TUG time using SPC to 15 seconds or less as a demonstration of improved functional mobility and balance.    Baseline  26.3 seconds on average using SPC (09/30/2018); 17 seconds average with SPC (11/05/2018); 17.48 seconds without the SPC (12/24/2018)    Time  6    Period  Weeks    Status  Partially Met    Target Date  02/06/19      PT LONG TERM GOAL #7   Title  Patient will improve her Berg Balance Test score to 40/56 or more as a demonstration of improved balance and decreased fall risk.    Baseline  33/56 (09/30/2018); 43/56 (11/05/2018), (12/24/18)    Time  6    Period  Weeks    Status  Partially Met    Target Date  02/06/19  Plan - 12/31/18 1305    Clinical Impression Statement  Continued working on decreasing L lateral knee soft tissue tightness (helps per pt), as well as improving L glute and quadriceps strengthening to promote stability and better mechanics at her L knee joint. Pt will benefit from continued skilled physical therapy services to decrease pain, improve strength, function, and ability to ambulate more comfortably. No pain in L knee with gait with SPC after session.    Personal Factors and Comorbidities  Age;Comorbidity 3+;Fitness;Past/Current Experience;Time since onset of injury/illness/exacerbation    Comorbidities  Memory deficit, rheumatoid arthritis, dyspnea, arthritis, B TKA, hx of hysterectomy, myelodysplastic syndrome, arthritis multiple areas    Examination-Activity Limitations  Locomotion Level;Carry;Stairs;Stand    Stability/Clinical Decision Making  Stable/Uncomplicated    Rehab  Potential  Fair    Clinical Impairments Affecting Rehab Potential  sedentary lifestyle, age, medical history and chronicity of condition.    PT Frequency  2x / week    PT Duration  6 weeks    PT Treatment/Interventions  Aquatic Therapy;Cryotherapy;Moist Heat;Gait training;Stair training;Functional mobility training;Therapeutic activities;Therapeutic exercise;Patient/family education;Neuromuscular re-education;Balance training;Manual techniques;Taping;Energy conservation;Passive range of motion;Electrical Stimulation;Iontophoresis 51m/ml Dexamethasone;Dry needling    PT Next Visit Plan  Continue with core strengthening, hip strenghtening.    PT [email or web address] code: [email or web address]   Consulted and Agree with Plan of Care  Patient       Patient will benefit from skilled therapeutic intervention in order to improve the following deficits and impairments:  Abnormal gait, Pain, Postural dysfunction, Decreased strength, Difficulty walking, Improper body mechanics, Decreased balance  Visit Diagnosis: Chronic pain of left knee  Unsteadiness on feet  Difficulty in walking, not elsewhere classified  Chronic bilateral low back pain, unspecified whether sciatica present  Radiculopathy, lumbosacral region  Muscle weakness (generalized)  Pain in thoracic spine     Problem List Patient Active Problem List   Diagnosis Date Noted  . Protein-calorie malnutrition ([place] 08/27/2017  . 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 Cubital canal compression syndrome, right 04/18/2017  . Primary osteoarthritis of knee 03/25/2017  . 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 Pure hypercholesterolemia 08/17/2016  . MDS (myelodysplastic syndrome) ([place] 05/12/2016  . B12 deficiency 05/04/2016  . Essential hypertension 05/04/2016  . Mixed stress and urge urinary incontinence 05/04/2016  . Rheumatoid arthritis involving multiple sites with positive rheumatoid factor ([place]  05/04/2016    MJoneen BoersPT, DPT   12/31/2018, 1:51 PM  [facility] [place] [facility] [address] [place] [address]Phone: [phone]  Fax:  [phone] Name: Sylvia SiegfriedMRN: [id number] of Birth: [birth year]

## 2019-01-02 ENCOUNTER — Ambulatory Visit: Payer: Medicare Other

## 2019-01-02 ENCOUNTER — Other Ambulatory Visit: Payer: Self-pay

## 2019-01-02 DIAGNOSIS — M25562 Pain in left knee: Secondary | ICD-10-CM | POA: Diagnosis not present

## 2019-01-02 DIAGNOSIS — M5417 Radiculopathy, lumbosacral region: Secondary | ICD-10-CM

## 2019-01-02 DIAGNOSIS — M545 Low back pain: Secondary | ICD-10-CM

## 2019-01-02 DIAGNOSIS — M546 Pain in thoracic spine: Secondary | ICD-10-CM

## 2019-01-02 DIAGNOSIS — R2681 Unsteadiness on feet: Secondary | ICD-10-CM

## 2019-01-02 DIAGNOSIS — M6281 Muscle weakness (generalized): Secondary | ICD-10-CM

## 2019-01-02 DIAGNOSIS — R262 Difficulty in walking, not elsewhere classified: Secondary | ICD-10-CM

## 2019-01-02 DIAGNOSIS — G8929 Other chronic pain: Secondary | ICD-10-CM

## 2019-01-02 NOTE — Therapy (Signed)
[place] [facility] [address], [place], [address] Phone: [phone]   Fax:  [phone]  Physical Therapy Treatment And Progress Report (11/19/2018 - 01/02/2019)  Patient Details  Name: Sylvia Miller MRN: [id number] Date of Birth: 10-17-1940 Referring Provider (PT): Marlowe Sax, MD   Encounter Date: 01/02/2019  PT End of Session - 01/02/19 1301    Visit Number  40    Number of Visits  61    Date for PT Re-Evaluation  02/06/19    Authorization Type  10    Authorization Time Period  of 10 progress report    PT Start Time  1301    PT Stop Time  1341    PT Time Calculation (min)  40 min    Activity Tolerance  Patient tolerated treatment well;No increased pain;Patient limited by fatigue    Behavior During Therapy  [facility] for tasks assessed/performed       Past Medical History:  Diagnosis Date  . Allergy   . Anemia   . Arthritis    rheumatoid /knees/hands/shoulders (Right shoulder) infusion every 5 weeks  . Bladder incontinence   . Chicken pox   . Dyspnea    with exertion or far walking  . Gastric ulcer   . GERD (gastroesophageal reflux disease)   . Heart murmur   . Hypercholesteremia   . Hyperlipidemia, unspecified   . Hypertension    controlled with meds;   Marland Kitchen MDS (myelodysplastic syndrome) ([place]) 2000  . Memory deficit   . Muscular dystrophy ([place])    per patient, this is incorrect  . Myelodysplastic syndrome ([place])   . Myelodysplastic syndrome ([place])   . Rheumatoid arthritis ([place])   . Ulcer     Past Surgical History:  Procedure Laterality Date  . ABDOMINAL HYSTERECTOMY    . APPENDECTOMY     as a child  . CHOLECYSTECTOMY    . COLONOSCOPY    . COLONOSCOPY WITH PROPOFOL N/A 04/24/2017   Procedure: COLONOSCOPY WITH PROPOFOL;  Surgeon: Lollie Sails, MD;  Location: [facility] ENDOSCOPY;  Service: Endoscopy;  Laterality: N/A;  . ESOPHAGOGASTRODUODENOSCOPY (EGD) WITH PROPOFOL N/A 04/24/2017    Procedure: ESOPHAGOGASTRODUODENOSCOPY (EGD) WITH PROPOFOL;  Surgeon: Lollie Sails, MD;  Location: [facility] ENDOSCOPY;  Service: Endoscopy;  Laterality: N/A;  . JOINT REPLACEMENT    . KNEE ARTHROPLASTY Right 02/21/2017   Procedure: COMPUTER ASSISTED TOTAL KNEE ARTHROPLASTY;  Surgeon: Dereck Leep, MD;  Location: [facility] ORS;  Service: Orthopedics;  Laterality: Right;  . KNEE ARTHROPLASTY Left 08/06/2017   Procedure: COMPUTER ASSISTED TOTAL KNEE ARTHROPLASTY;  Surgeon: Dereck Leep, MD;  Location: [facility] ORS;  Service: Orthopedics;  Laterality: Left;  . NM INTRAVENOUS INJECTION ([facility] HX)     receives biologic infusions every 5 weeks for rheumatoid arthritis. followed by dr. Meda Coffee at [place] [facility]  . TONSILLECTOMY    . UPPER GASTROINTESTINAL ENDOSCOPY    . WISDOM TOOTH EXTRACTION      There were no vitals filed for this visit.  Subjective Assessment - 01/02/19 1303    Subjective  L knee and back are ok. No worse that its been.    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 Pt had L TKA 08/05/2017. Last 3-4 months pt has been having pain L lateral leg. Pt also states feeling pain in her upper back  including her B shoulders. Mid back pain travels down her spine to her low back and feel radiating pain  down her L lateral leg. R LE seems fine.   Denies loss of bowel or bladder control, or saddle anesthesia.  No falls within the last 6 months.  Low back and L lateral leg pain seems to have increased since onset.     Patient Stated Goals  Walk more comfortably. Stand longer than a minute at church.    Currently in Pain?  Yes    Pain Score  8    7-8/10 L knee pain   Pain Onset  More than a month ago                               PT Education - 01/02/19 1310    Education provided  Yes    Education Details  ther-ex    [employer]) Educated  Patient    Methods  Explanation;Demonstration;Tactile cues;Verbal cues    Comprehension  Returned  demonstration;Verbalized understanding        Objective  MedbridgeAccess Code:Access Code: [email or web address]    Pain face scale utilized to help pt with pain scores  Manual therapy Seated STM L latearl hamstrings, IT band, vastus lateralis   Therapeutic exercise  Forward step ups onto Air Ex pad with L LE and B UE assist             10x3  Lateral step up onto Air Ex pad with B UE assist 10x2   Standing hip abduction with B UE assist to promote glute med muscle strengthening.              L 10x2             R 10x2   Standing hip extension with B UE assist to promote glute max muscle strengthening  R 10x2  L 10x2   Side stepping 5 ft to the L and 5 ft to the R with B UE assist 7x to promote glute med muscle strengthening    Sit <> stand without UE assist 5x   Seated hip adduction ball and glute max muscle squeeze 10x5 seconds for 3 sets       Improved exercise technique, movement at target joints, use of target muscles after mod verbal, visual, tactile cues.      Pt response to treatment Rest breaks provided secondary to fatigueand to allow muscle recovery for next exercise.Pt tolearted session without aggravation of symptoms.   Clinical impression Continued working on glute med, max muscle strengthening L LE stability, and improving soft tissue mobility L lateral knee and thigh to promote better mechanics at her L knee joint. Pt tolerated session well without aggravation of symptoms. Pt will benefit from continued skilled physical therapy services to decrease pain, improve strength and function. No L knee pain during gait after session. Physical therapy services necessary secondary to service continues to help provide pain relief for pt.     PT Short Term Goals - 08/08/18 1826      PT SHORT TERM GOAL #1   Title  Patient will be independent with her HEP to improve strength, decrease pain, improve function    Time  3    Period  Weeks     Status  On-going    Target Date  05/23/18        PT Long Term Goals - 01/02/19 1330      PT LONG TERM GOAL #1   Title  Patient will have a decrease in mid to low  back pain to 5/10 or less at worst to promote ability to ambulate more comfortably and tolerate standing longer.     Baseline  8/10 back pain at worst for the past 2 months (possible greater than 8/10 at worst based on pt reports but pt seems to have difficulty with the pain scale; 04/30/2018); 5-6/10 mid to low back pain at most for the past month (08/08/2018); 7/10 back pain at worst for the past 7 days (09/24/2018); 6-7/10 back pain at most for the past 7 days (11/05/2018), (12/24/2018)    Time  6    Period  Weeks    Status  On-going    Target Date  02/06/19      PT LONG TERM GOAL #2   Title  Pt will have a decrease in L lateral leg pain to 5/10 or less at worst  to promote ability to ambulate more comfortably and tolerate standing longer.     Baseline  10/10 at worst for the past 2 months (04/30/2018); 7-8/10 L lateral leg pain/tingling at worst for the past month (08/08/2018). 7/10 tingling at most for the past 7 days (09/24/2018); 6-7/10 at most for the past 7 days (11/05/2018), (12/24/2018)    Time  6    Period  Weeks    Status  On-going    Target Date  02/06/19      PT LONG TERM GOAL #3   Title  Patient will have a decrease in L knee joint pain to 5/10 or less at worst  to promote ability to ambulate more comfortably and tolerate standing longer.     Baseline  10/10 L knee pain at worst for the past 2 months (04/30/2018); 8/10 at worst for the past month (08/08/2018); 7-8/10 L knee pain at worst for the past 7 days (09/24/2018); 6-7/10 at worst for the past 7 days (11/05/2018), (12/24/2018)    Time  6    Period  Weeks    Status  On-going    Target Date  02/06/19      PT LONG TERM GOAL #4   Title  Pt will improve B LE strength by at least 1/2 MMT grade  to promote ability to ambulate more comfortably and tolerate standing longer.      Time  6    Period  Weeks    Status  Partially Met    Target Date  02/06/19      PT LONG TERM GOAL #5   Title  Pt will report being able to tolerate standing to 5 minutes or more to promote ability to stand at church as well as improve ability to perform chores.     Baseline  Pt able to tolerate standing 1 min per subjective reports. (04/30/2018); half a minute per pt (08/08/2018); 2 minutes 8 seconds with light touch assist (09/24/2018); able to stand 2 min and 50 seconds prior to sitting down (11/05/2018). Able to stand at least 6 minutes (12/2018)    Time  6    Period  Weeks    Status  Achieved    Target Date  02/06/19      PT LONG TERM GOAL #6   Title  Patient will improve her TUG time using SPC to 15 seconds or less as a demonstration of improved functional mobility and balance.    Baseline  26.3 seconds on average using SPC (09/30/2018); 17 seconds average with SPC (11/05/2018); 17.48 seconds without the SPC (12/24/2018)    Time  6  Period  Weeks    Status  Partially Met    Target Date  02/06/19      PT LONG TERM GOAL #7   Title  Patient will improve her Merrilee Jansky Balance Test score to 40/56 or more as a demonstration of improved balance and decreased fall risk.    Baseline  33/56 (09/30/2018); 43/56 (11/05/2018), (12/24/18)    Time  6    Period  Weeks    Status  Partially Met    Target Date  02/06/19            Plan - 01/02/19 1326    Clinical Impression Statement  Continued working on glute med, max muscle strengthening L LE stability, and improving soft tissue mobility L lateral knee and thigh to promote better mechanics at her L knee joint. Pt tolerated session well without aggravation of symptoms. Pt will benefit from continued skilled physical therapy services to decrease pain, improve strength and function. No L knee pain during gait after session. Physical therapy services necessary secondary to service continues to help provide pain relief for pt.    Personal Factors and  Comorbidities  Age;Comorbidity 3+;Fitness;Past/Current Experience;Time since onset of injury/illness/exacerbation    Comorbidities  Memory deficit, rheumatoid arthritis, dyspnea, arthritis, B TKA, hx of hysterectomy, myelodysplastic syndrome, arthritis multiple areas    Examination-Activity Limitations  Locomotion Level;Carry;Stairs;Stand    Stability/Clinical Decision Making  Stable/Uncomplicated    Clinical Decision Making  Low    Clinical Presentation due to:  Pt slowly making progress towards goals    Rehab Potential  Fair    Clinical Impairments Affecting Rehab Potential  sedentary lifestyle, age, medical history and chronicity of condition.    PT Frequency  2x / week    PT Duration  6 weeks    PT Treatment/Interventions  Aquatic Therapy;Cryotherapy;Moist Heat;Gait training;Stair training;Functional mobility training;Therapeutic activities;Therapeutic exercise;Patient/family education;Neuromuscular re-education;Balance training;Manual techniques;Taping;Energy conservation;Passive range of motion;Electrical Stimulation;Iontophoresis 32m/ml Dexamethasone;Dry needling    PT Next Visit Plan  Continue with core strengthening, hip strenghtening.    PT HCleo SpringsAccess code: [email or web address]   Consulted and Agree with Plan of Care  Patient       Patient will benefit from skilled therapeutic intervention in order to improve the following deficits and impairments:  Abnormal gait, Pain, Postural dysfunction, Decreased strength, Difficulty walking, Improper body mechanics, Decreased balance  Visit Diagnosis: Chronic pain of left knee  Unsteadiness on feet  Difficulty in walking, not elsewhere classified  Chronic bilateral low back pain, unspecified whether sciatica present  Radiculopathy, lumbosacral region  Muscle weakness (generalized)  Pain in thoracic spine     Problem List Patient Active Problem List   Diagnosis Date Noted  . Protein-calorie malnutrition ([place]  08/27/2017  . 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 Cubital canal compression syndrome, right 04/18/2017  . Primary osteoarthritis of knee 03/25/2017  . 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 Pure hypercholesterolemia 08/17/2016  . [place]DS ([place] syndrome) [place] 05/12/2016  . B12 deficiency 05/04/2016  . Essential hypertension 05/04/2016  . Mixed stress and urge urinary incontinence 05/04/2016  . Rheumatoid arthritis involving multiple sites with positive rheumatoid factor ([place] 05/04/2016    MJoneen BoersPT, DPT   01/02/2019, 1:55 PM  [place] [facility] [address] [place] [address]Phone: [phone]  Fax:  [phone] Name: JKrislyn DonnanMRN: [id number] of Birth: 302/07/1940

## 2019-01-07 ENCOUNTER — Ambulatory Visit: Payer: Medicare Other | Attending: Internal Medicine

## 2019-01-07 ENCOUNTER — Other Ambulatory Visit: Payer: Self-pay

## 2019-01-07 DIAGNOSIS — M6281 Muscle weakness (generalized): Secondary | ICD-10-CM | POA: Diagnosis present

## 2019-01-07 DIAGNOSIS — M545 Low back pain, unspecified: Secondary | ICD-10-CM

## 2019-01-07 DIAGNOSIS — M5417 Radiculopathy, lumbosacral region: Secondary | ICD-10-CM | POA: Diagnosis present

## 2019-01-07 DIAGNOSIS — G8929 Other chronic pain: Secondary | ICD-10-CM

## 2019-01-07 DIAGNOSIS — R2681 Unsteadiness on feet: Secondary | ICD-10-CM

## 2019-01-07 DIAGNOSIS — M546 Pain in thoracic spine: Secondary | ICD-10-CM | POA: Diagnosis present

## 2019-01-07 DIAGNOSIS — R262 Difficulty in walking, not elsewhere classified: Secondary | ICD-10-CM | POA: Diagnosis present

## 2019-01-07 DIAGNOSIS — M25562 Pain in left knee: Secondary | ICD-10-CM | POA: Insufficient documentation

## 2019-01-07 NOTE — Therapy (Signed)
[place] [facility] [address] [place], [address] Phone: [phone]   Fax:  [phone]  Physical Therapy Treatment  Patient Details  Name: Sylvia Miller MRN: [id number] Date of Birth: Jan 06, 1941 Referring Provider (PT): Marlowe Sax, MD   Encounter Date: 01/07/2019  PT End of Session - 01/07/19 1311    Visit Number  41    Number of Visits  61    Date for PT Re-Evaluation  02/06/19    Authorization Type  1    Authorization Time Period  of 10 progress report    PT Start Time  3810   pt arrived late   PT Stop Time  1344    PT Time Calculation (min)  33 min    Activity Tolerance  Patient tolerated treatment well;No increased pain;Patient limited by fatigue    Behavior During Therapy  [facility] for tasks assessed/performed       Past Medical History:  Diagnosis Date  . Allergy   . Anemia   . Arthritis    rheumatoid /knees/hands/shoulders (Right shoulder) infusion every 5 weeks  . Bladder incontinence   . Chicken pox   . Dyspnea    with exertion or far walking  . Gastric ulcer   . GERD (gastroesophageal reflux disease)   . Heart murmur   . Hypercholesteremia   . Hyperlipidemia, unspecified   . Hypertension    controlled with meds;   Marland Kitchen MDS (myelodysplastic syndrome) ([place]) 2000  . Memory deficit   . Muscular dystrophy ([place])    per patient, this is incorrect  . Myelodysplastic syndrome ([place])   . Myelodysplastic syndrome ([place])   . Rheumatoid arthritis ([place])   . Ulcer     Past Surgical History:  Procedure Laterality Date  . ABDOMINAL HYSTERECTOMY    . APPENDECTOMY     as a child  . CHOLECYSTECTOMY    . COLONOSCOPY    . COLONOSCOPY WITH PROPOFOL N/A 04/24/2017   Procedure: COLONOSCOPY WITH PROPOFOL;  Surgeon: Lollie Sails, MD;  Location: [facility] ENDOSCOPY;  Service: Endoscopy;  Laterality: N/A;  . ESOPHAGOGASTRODUODENOSCOPY (EGD) WITH PROPOFOL N/A 04/24/2017   Procedure:  ESOPHAGOGASTRODUODENOSCOPY (EGD) WITH PROPOFOL;  Surgeon: Lollie Sails, MD;  Location: [facility] ENDOSCOPY;  Service: Endoscopy;  Laterality: N/A;  . JOINT REPLACEMENT    . KNEE ARTHROPLASTY Right 02/21/2017   Procedure: COMPUTER ASSISTED TOTAL KNEE ARTHROPLASTY;  Surgeon: Dereck Leep, MD;  Location: [facility] ORS;  Service: Orthopedics;  Laterality: Right;  . KNEE ARTHROPLASTY Left 08/06/2017   Procedure: COMPUTER ASSISTED TOTAL KNEE ARTHROPLASTY;  Surgeon: Dereck Leep, MD;  Location: [facility] ORS;  Service: Orthopedics;  Laterality: Left;  . NM INTRAVENOUS INJECTION ([facility] HX)     receives biologic infusions every 5 weeks for rheumatoid arthritis. followed by dr. Meda Coffee at [place] [facility]  . TONSILLECTOMY    . UPPER GASTROINTESTINAL ENDOSCOPY    . WISDOM TOOTH EXTRACTION      There were no vitals filed for this visit.  Subjective Assessment - 01/07/19 1313    Subjective  L knee is not too bad. Shoulders kind of hurt. L lateral leg bothers her. 5/10 currently. Shoulders feel tight in the front.    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 Pt had L TKA 08/05/2017. Last 3-4 months pt has been having pain L lateral leg. Pt also states feeling pain in her upper back  including her B shoulders. Mid back pain travels down  her spine to her low back and feel radiating pain down her L lateral leg. R LE seems fine.   Denies loss of bowel or bladder control, or saddle anesthesia.  No falls within the last 6 months.  Low back and L lateral leg pain seems to have increased since onset.     Patient Stated Goals  Walk more comfortably. Stand longer than a minute at church.    Currently in Pain?  Yes    Pain Score  5     Pain Onset  More than a month ago                               PT Education - 01/07/19 1337    Education provided  Yes    Education Details  ther-ex    [employer]) Educated  Patient    Methods  Explanation;Demonstration;Tactile cues;Verbal  cues    Comprehension  Returned demonstration;Verbalized understanding      Objective  MedbridgeAccess Code:Access Code: [email or web address]    Pain face scale utilized to help pt with pain scores  Manual therapy Seated STM L latearl hamstrings, IT band, vastus lateralis   Therapeutic exercise  Standing hip abduction with B UE assist to promote glute med muscle strengthening.  L 10x2 R 10x2   Forward step ups onto Air Ex pad with L LE and B UE assist 10x3  Lateral step up onto Air Ex pad with B UE assist 10x   Sit <> stand without UE assist 5x  Standing hip extension with B UE assist to promote glute max muscle strengthening             R 10x (L knee discomfort)             L 10x2  Improved exercise technique, movement at target joints, use of target muscles after mod verbal, visual, tactile cues.      Pt response to treatment Rest breaks provided secondary to fatigueand to allow muscle recovery for next exercise.L knee discomfort at times with standing exercises in which pt demonstrates tendency for L knee hyper extension suggesting continued need for L quadriceps strengthening.   Clinical impression Slight improvement in L knee pain today overall compared to previous session based on subjective reports. Continued working on closed chain L LE strengthening to promote functional strength and stability at her L knee. L knee discomfort at times with standing exercises in which pt demonstrates tendency for L knee hyper extension suggesting continued need for L quadriceps strengthening. Pt will benefit from continued skilled physical therapy services to decrease pain, improve strength, function and ability to ambulate with less difficulty.     PT Short Term Goals - 08/08/18 1826      PT SHORT TERM GOAL #1   Title  Patient will be independent with her HEP to improve strength, decrease pain, improve function    Time   3    Period  Weeks    Status  On-going    Target Date  05/23/18        PT Long Term Goals - 01/02/19 1330      PT LONG TERM GOAL #1   Title  Patient will have a decrease in mid to low back pain to 5/10 or less at worst to promote ability to ambulate more comfortably and tolerate standing longer.     Baseline  8/10 back pain at worst for the past 2 months (  possible greater than 8/10 at worst based on pt reports but pt seems to have difficulty with the pain scale; 04/30/2018); 5-6/10 mid to low back pain at most for the past month (08/08/2018); 7/10 back pain at worst for the past 7 days (09/24/2018); 6-7/10 back pain at most for the past 7 days (11/05/2018), (12/24/2018)    Time  6    Period  Weeks    Status  On-going    Target Date  02/06/19      PT LONG TERM GOAL #2   Title  Pt will have a decrease in L lateral leg pain to 5/10 or less at worst  to promote ability to ambulate more comfortably and tolerate standing longer.     Baseline  10/10 at worst for the past 2 months (04/30/2018); 7-8/10 L lateral leg pain/tingling at worst for the past month (08/08/2018). 7/10 tingling at most for the past 7 days (09/24/2018); 6-7/10 at most for the past 7 days (11/05/2018), (12/24/2018)    Time  6    Period  Weeks    Status  On-going    Target Date  02/06/19      PT LONG TERM GOAL #3   Title  Patient will have a decrease in L knee joint pain to 5/10 or less at worst  to promote ability to ambulate more comfortably and tolerate standing longer.     Baseline  10/10 L knee pain at worst for the past 2 months (04/30/2018); 8/10 at worst for the past month (08/08/2018); 7-8/10 L knee pain at worst for the past 7 days (09/24/2018); 6-7/10 at worst for the past 7 days (11/05/2018), (12/24/2018)    Time  6    Period  Weeks    Status  On-going    Target Date  02/06/19      PT LONG TERM GOAL #4   Title  Pt will improve B LE strength by at least 1/2 MMT grade  to promote ability to ambulate more comfortably and  tolerate standing longer.     Time  6    Period  Weeks    Status  Partially Met    Target Date  02/06/19      PT LONG TERM GOAL #5   Title  Pt will report being able to tolerate standing to 5 minutes or more to promote ability to stand at church as well as improve ability to perform chores.     Baseline  Pt able to tolerate standing 1 min per subjective reports. (04/30/2018); half a minute per pt (08/08/2018); 2 minutes 8 seconds with light touch assist (09/24/2018); able to stand 2 min and 50 seconds prior to sitting down (11/05/2018). Able to stand at least 6 minutes (12/2018)    Time  6    Period  Weeks    Status  Achieved    Target Date  02/06/19      PT LONG TERM GOAL #6   Title  Patient will improve her TUG time using SPC to 15 seconds or less as a demonstration of improved functional mobility and balance.    Baseline  26.3 seconds on average using SPC (09/30/2018); 17 seconds average with SPC (11/05/2018); 17.48 seconds without the SPC (12/24/2018)    Time  6    Period  Weeks    Status  Partially Met    Target Date  02/06/19      PT LONG TERM GOAL #7   Title  Patient will improve  her Merrilee Jansky Balance Test score to 40/56 or more as a demonstration of improved balance and decreased fall risk.    Baseline  33/56 (09/30/2018); 43/56 (11/05/2018), (12/24/18)    Time  6    Period  Weeks    Status  Partially Met    Target Date  02/06/19            Plan - 01/07/19 1337    Clinical Impression Statement  Slight improvement in L knee pain today overall compared to previous session based on subjective reports. Continued working on closed chain L LE strengthening to promote functional strength and stability at her L knee. L knee discomfort at times with standing exercises in which pt demonstrates tendency for L knee hyper extension suggesting continued need for L quadriceps strengthening. Pt will benefit from continued skilled physical therapy services to decrease pain, improve strength, function and  ability to ambulate with less difficulty.    Personal Factors and Comorbidities  Age;Comorbidity 3+;Fitness;Past/Current Experience;Time since onset of injury/illness/exacerbation    Comorbidities  Memory deficit, rheumatoid arthritis, dyspnea, arthritis, B TKA, hx of hysterectomy, myelodysplastic syndrome, arthritis multiple areas    Examination-Activity Limitations  Locomotion Level;Carry;Stairs;Stand    Stability/Clinical Decision Making  Stable/Uncomplicated    Rehab Potential  Fair    Clinical Impairments Affecting Rehab Potential  sedentary lifestyle, age, medical history and chronicity of condition.    PT Frequency  2x / week    PT Duration  6 weeks    PT Treatment/Interventions  Aquatic Therapy;Cryotherapy;Moist Heat;Gait training;Stair training;Functional mobility training;Therapeutic activities;Therapeutic exercise;Patient/family education;Neuromuscular re-education;Balance training;Manual techniques;Taping;Energy conservation;Passive range of motion;Electrical Stimulation;Iontophoresis 57m/ml Dexamethasone;Dry needling    PT Next Visit Plan  Continue with core strengthening, hip strenghtening.    PT Home Exercise Plan  MSuffernAccess code: [email or web address]   Consulted and Agree with Plan of Care  Patient       Patient will benefit from skilled therapeutic intervention in order to improve the following deficits and impairments:  Abnormal gait, Pain, Postural dysfunction, Decreased strength, Difficulty walking, Improper body mechanics, Decreased balance  Visit Diagnosis: Chronic pain of left knee  Unsteadiness on feet  Difficulty in walking, not elsewhere classified  Chronic bilateral low back pain, unspecified whether sciatica present  Radiculopathy, lumbosacral region  Pain in thoracic spine  Muscle weakness (generalized)     Problem List Patient Active Problem List   Diagnosis Date Noted  . Protein-calorie malnutrition ([place] 08/27/2017  . S/P total knee arthroplasty  08/06/2017  . Cubital canal compression syndrome, right 04/18/2017  . Primary osteoarthritis of knee 03/25/2017  . 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 Pure hypercholesterolemia 08/17/2016  . [place]) [place] 05/12/2016  . B12 deficiency 05/04/2016  . Essential hypertension 05/04/2016  . Mixed stress and urge urinary incontinence 05/04/2016  . Rheumatoid arthritis involving multiple sites with positive rheumatoid factor ([place] 05/04/2016     MJoneen BoersPT, DPT   01/07/2019, 3:02 PM  [facility] [address] [place] [address]Phone: [phone]  Fax:  [phone] Name: Sylvia GhanemMRN: [id number] of Birth: 3February 09, 1942

## 2019-01-09 ENCOUNTER — Ambulatory Visit: Payer: Medicare Other

## 2019-01-09 ENCOUNTER — Other Ambulatory Visit: Payer: Self-pay

## 2019-01-09 DIAGNOSIS — G8929 Other chronic pain: Secondary | ICD-10-CM

## 2019-01-09 DIAGNOSIS — M25562 Pain in left knee: Secondary | ICD-10-CM | POA: Diagnosis not present

## 2019-01-09 DIAGNOSIS — R262 Difficulty in walking, not elsewhere classified: Secondary | ICD-10-CM

## 2019-01-09 DIAGNOSIS — M6281 Muscle weakness (generalized): Secondary | ICD-10-CM

## 2019-01-09 DIAGNOSIS — R2681 Unsteadiness on feet: Secondary | ICD-10-CM

## 2019-01-09 DIAGNOSIS — M5417 Radiculopathy, lumbosacral region: Secondary | ICD-10-CM

## 2019-01-09 DIAGNOSIS — M545 Low back pain, unspecified: Secondary | ICD-10-CM

## 2019-01-09 DIAGNOSIS — M546 Pain in thoracic spine: Secondary | ICD-10-CM

## 2019-01-09 NOTE — Therapy (Signed)
[facility] [address] [place], [address] Phone: [phone]   Fax:  [phone]  Physical Therapy Treatment  Patient Details  Name: Sylvia Miller MRN: [id number] Date of Birth: 06-14-1940 Referring Provider (PT): Marlowe Sax, MD   Encounter Date: 01/09/2019  PT End of Session - 01/09/19 1309    Visit Number  42    Number of Visits  67    Date for PT Re-Evaluation  02/06/19    Authorization Type  2    Authorization Time Period  of 10 progress report    PT Start Time  5465   pt arrived late and used restroom   PT Stop Time  1344    PT Time Calculation (min)  32 min    Activity Tolerance  No increased pain;Patient limited by fatigue    Behavior During Therapy  [facility] for tasks assessed/performed       Past Medical History:  Diagnosis Date  . Allergy   . Anemia   . Arthritis    rheumatoid /knees/hands/shoulders (Right shoulder) infusion every 5 weeks  . Bladder incontinence   . Chicken pox   . Dyspnea    with exertion or far walking  . Gastric ulcer   . GERD (gastroesophageal reflux disease)   . Heart murmur   . Hypercholesteremia   . Hyperlipidemia, unspecified   . Hypertension    controlled with meds;   Marland Kitchen MDS (myelodysplastic syndrome) ([place]) 2000  . Memory deficit   . Muscular dystrophy ([place])    per patient, this is incorrect  . Myelodysplastic syndrome ([place])   . Myelodysplastic syndrome ([place])   . Rheumatoid arthritis ([place])   . Ulcer     Past Surgical History:  Procedure Laterality Date  . ABDOMINAL HYSTERECTOMY    . APPENDECTOMY     as a child  . CHOLECYSTECTOMY    . COLONOSCOPY    . COLONOSCOPY WITH PROPOFOL N/A 04/24/2017   Procedure: COLONOSCOPY WITH PROPOFOL;  Surgeon: Lollie Sails, MD;  Location: [facility] ENDOSCOPY;  Service: Endoscopy;  Laterality: N/A;  . ESOPHAGOGASTRODUODENOSCOPY (EGD) WITH PROPOFOL N/A 04/24/2017   Procedure: ESOPHAGOGASTRODUODENOSCOPY (EGD)  WITH PROPOFOL;  Surgeon: Lollie Sails, MD;  Location: [facility] ENDOSCOPY;  Service: Endoscopy;  Laterality: N/A;  . JOINT REPLACEMENT    . KNEE ARTHROPLASTY Right 02/21/2017   Procedure: COMPUTER ASSISTED TOTAL KNEE ARTHROPLASTY;  Surgeon: Dereck Leep, MD;  Location: [facility] ORS;  Service: Orthopedics;  Laterality: Right;  . KNEE ARTHROPLASTY Left 08/06/2017   Procedure: COMPUTER ASSISTED TOTAL KNEE ARTHROPLASTY;  Surgeon: Dereck Leep, MD;  Location: [facility] ORS;  Service: Orthopedics;  Laterality: Left;  . NM INTRAVENOUS INJECTION ([facility] HX)     receives biologic infusions every 5 weeks for rheumatoid arthritis. followed by dr. Meda Coffee at [facility]  . TONSILLECTOMY    . UPPER GASTROINTESTINAL ENDOSCOPY    . WISDOM TOOTH EXTRACTION      There were no vitals filed for this visit.  Subjective Assessment - 01/09/19 1311    Subjective  Patient reported that her calves hurt today, that they have been rushing around a lot. socialized with family and also went out for lunch.    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 Pt had L TKA 08/05/2017. Last 3-4 months pt has been having pain L lateral leg. Pt also states feeling pain in her upper back  including her B shoulders. Mid back pain travels  down her spine to her low back and feel radiating pain down her L lateral leg. R LE seems fine.   Denies loss of bowel or bladder control, or saddle anesthesia.  No falls within the last 6 months.  Low back and L lateral leg pain seems to have increased since onset.     Patient Stated Goals  Walk more comfortably. Stand longer than a minute at church.    Currently in Pain?  Yes    Pain Score  6     Pain Location  Leg    Pain Orientation  Posterior    Pain Descriptors / Indicators  Constant;Sore    Pain Type  Chronic pain    Pain Onset  More than a month ago       Objective   Medbridge Access Code: Access Code: [email or web address]    Pain face scale utilized to help pt with pain  scores    Patient arrived late to appointment, modified accordingly   Manual therapy   Seated STM L lateral hamstrings, IT band, vastus lateralis, bilateral calves      Therapeutic exercise   Standing hip abduction with B UE assist to promote glute med muscle strengthening.                L 10x2               R 10x2   Forward step ups onto Air Ex pad with L LE and B UE assist               x10 ea leg without seated rest break  Lateral step up onto Air Ex pad with B UE assist 15x  Sit <> stand without UE assist 5x        Improved exercise technique, movement at target joints, use of target muscles after mod verbal, visual, tactile cues.    Pt response to treatment/Clinical impression: Patient without complaint of pain during exercises this session, reported pain was still about a 6/10 after session. The patient continued to fatigue easily with exercise, encouragement needed to maximize participation. The patient was able to perform standing hip abduction 2x10 bilaterally without seated rest break between sets, and increased her repetitions of lateral step ups. The patient would benefit from further skilled PT to continue to address deficits to improve mobility and independence.       PT Education - 01/09/19 1308    Education provided  Yes    Education Details  ther-ex    [employer]) Educated  Patient    Methods  Explanation;Demonstration;Tactile cues;Verbal cues    Comprehension  Verbalized understanding;Returned demonstration;Verbal cues required;Tactile cues required       PT Short Term Goals - 08/08/18 1826      PT SHORT TERM GOAL #1   Title  Patient will be independent with her HEP to improve strength, decrease pain, improve function    Time  3    Period  Weeks    Status  On-going    Target Date  05/23/18        PT Long Term Goals - 01/02/19 1330      PT LONG TERM GOAL #1   Title  Patient will have a decrease in mid to low back pain to 5/10 or less at  worst to promote ability to ambulate more comfortably and tolerate standing longer.     Baseline  8/10 back pain at worst for the past 2 months (possible greater than 8/10  at worst based on pt reports but pt seems to have difficulty with the pain scale; 04/30/2018); 5-6/10 mid to low back pain at most for the past month (08/08/2018); 7/10 back pain at worst for the past 7 days (09/24/2018); 6-7/10 back pain at most for the past 7 days (11/05/2018), (12/24/2018)    Time  6    Period  Weeks    Status  On-going    Target Date  02/06/19      PT LONG TERM GOAL #2   Title  Pt will have a decrease in L lateral leg pain to 5/10 or less at worst  to promote ability to ambulate more comfortably and tolerate standing longer.     Baseline  10/10 at worst for the past 2 months (04/30/2018); 7-8/10 L lateral leg pain/tingling at worst for the past month (08/08/2018). 7/10 tingling at most for the past 7 days (09/24/2018); 6-7/10 at most for the past 7 days (11/05/2018), (12/24/2018)    Time  6    Period  Weeks    Status  On-going    Target Date  02/06/19      PT LONG TERM GOAL #3   Title  Patient will have a decrease in L knee joint pain to 5/10 or less at worst  to promote ability to ambulate more comfortably and tolerate standing longer.     Baseline  10/10 L knee pain at worst for the past 2 months (04/30/2018); 8/10 at worst for the past month (08/08/2018); 7-8/10 L knee pain at worst for the past 7 days (09/24/2018); 6-7/10 at worst for the past 7 days (11/05/2018), (12/24/2018)    Time  6    Period  Weeks    Status  On-going    Target Date  02/06/19      PT LONG TERM GOAL #4   Title  Pt will improve B LE strength by at least 1/2 MMT grade  to promote ability to ambulate more comfortably and tolerate standing longer.     Time  6    Period  Weeks    Status  Partially Met    Target Date  02/06/19      PT LONG TERM GOAL #5   Title  Pt will report being able to tolerate standing to 5 minutes or more to promote  ability to stand at church as well as improve ability to perform chores.     Baseline  Pt able to tolerate standing 1 min per subjective reports. (04/30/2018); half a minute per pt (08/08/2018); 2 minutes 8 seconds with light touch assist (09/24/2018); able to stand 2 min and 50 seconds prior to sitting down (11/05/2018). Able to stand at least 6 minutes (12/2018)    Time  6    Period  Weeks    Status  Achieved    Target Date  02/06/19      PT LONG TERM GOAL #6   Title  Patient will improve her TUG time using SPC to 15 seconds or less as a demonstration of improved functional mobility and balance.    Baseline  26.3 seconds on average using SPC (09/30/2018); 17 seconds average with SPC (11/05/2018); 17.48 seconds without the SPC (12/24/2018)    Time  6    Period  Weeks    Status  Partially Met    Target Date  02/06/19      PT LONG TERM GOAL #7   Title  Patient will improve her Merrilee Jansky Balance Test  score to 40/56 or more as a demonstration of improved balance and decreased fall risk.    Baseline  33/56 (09/30/2018); 43/56 (11/05/2018), (12/24/18)    Time  6    Period  Weeks    Status  Partially Met    Target Date  02/06/19            Plan - 01/09/19 1351    Clinical Impression Statement  Patient without complaint of pain during exercises this session, reported pain was still about a 6/10 after session. The patient continued to fatigue easily with exercise, encouragement needed to maximize participation. The patient was able to perform standing hip abduction 2x10 bilaterally without seated rest break between sets, and increased her repetitions of lateral step ups. The patient would benefit from further skilled PT to continue to address deficits to improve mobility and independence.    Personal Factors and Comorbidities  Age;Comorbidity 3+;Fitness;Past/Current Experience;Time since onset of injury/illness/exacerbation    Comorbidities  Memory deficit, rheumatoid arthritis, dyspnea, arthritis, B TKA, hx  of hysterectomy, myelodysplastic syndrome, arthritis multiple areas    Examination-Activity Limitations  Locomotion Level;Carry;Stairs;Stand    Rehab Potential  Fair    Clinical Impairments Affecting Rehab Potential  sedentary lifestyle, age, medical history and chronicity of condition.    PT Frequency  2x / week    PT Duration  6 weeks    PT Treatment/Interventions  Aquatic Therapy;Cryotherapy;Moist Heat;Gait training;Stair training;Functional mobility training;Therapeutic activities;Therapeutic exercise;Patient/family education;Neuromuscular re-education;Balance training;Manual techniques;Taping;Energy conservation;Passive range of motion;Electrical Stimulation;Iontophoresis 32m/ml Dexamethasone;Dry needling    PT Next Visit Plan  Continue with core strengthening, hip strenghtening.    PT Home Exercise Plan  MSharonAccess code: [email or web address]   Consulted and Agree with Plan of Care  Patient    Family Member Consulted  husband       Patient will benefit from skilled therapeutic intervention in order to improve the following deficits and impairments:  Abnormal gait, Pain, Postural dysfunction, Decreased strength, Difficulty walking, Improper body mechanics, Decreased balance  Visit Diagnosis: Chronic pain of left knee  Unsteadiness on feet  Difficulty in walking, not elsewhere classified  Chronic bilateral low back pain, unspecified whether sciatica present  Radiculopathy, lumbosacral region  Pain in thoracic spine  Muscle weakness (generalized)     Problem List Patient Active Problem List   Diagnosis Date Noted  . Protein-calorie malnutrition ([place] 08/27/2017  . 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 Cubital canal compression syndrome, right 04/18/2017  . Primary osteoarthritis of knee 03/25/2017  . 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 Pure hypercholesterolemia 08/17/2016  . MDS (myelodysplastic syndrome) ([place] 05/12/2016  . B12 deficiency 05/04/2016   . Essential hypertension 05/04/2016  . Mixed stress and urge urinary incontinence 05/04/2016  . Rheumatoid arthritis involving multiple sites with positive rheumatoid factor ([place] 05/04/2016    DLieutenant DiegoPT, DPT 1:55 PM,01/09/19 3[facility] [address] [place] [address]Phone: [phone]  Fax:  [phone] Name: JMilicent AcheampongMRN: [id number] of Birth: [birth year]

## 2019-01-14 ENCOUNTER — Other Ambulatory Visit: Payer: Self-pay

## 2019-01-14 ENCOUNTER — Ambulatory Visit: Payer: Medicare Other

## 2019-01-14 DIAGNOSIS — M25562 Pain in left knee: Secondary | ICD-10-CM | POA: Diagnosis not present

## 2019-01-14 DIAGNOSIS — G8929 Other chronic pain: Secondary | ICD-10-CM

## 2019-01-14 DIAGNOSIS — M5417 Radiculopathy, lumbosacral region: Secondary | ICD-10-CM

## 2019-01-14 DIAGNOSIS — M6281 Muscle weakness (generalized): Secondary | ICD-10-CM

## 2019-01-14 DIAGNOSIS — R2681 Unsteadiness on feet: Secondary | ICD-10-CM

## 2019-01-14 DIAGNOSIS — M545 Low back pain, unspecified: Secondary | ICD-10-CM

## 2019-01-14 DIAGNOSIS — R262 Difficulty in walking, not elsewhere classified: Secondary | ICD-10-CM

## 2019-01-14 DIAGNOSIS — M546 Pain in thoracic spine: Secondary | ICD-10-CM

## 2019-01-14 NOTE — Therapy (Addendum)
[facility] [address], [place], [address] Phone: [phone]   Fax:  [phone]  Physical Therapy Treatment  Patient Details  Name: Zaidee Rion MRN: [id number] Date of Birth: Jul 18, 1940 Referring Provider (PT): Marlowe Sax, MD   Encounter Date: 01/14/2019  PT End of Session - 01/14/19 1311    Visit Number  43    Number of Visits  61    Date for PT Re-Evaluation  02/06/19    Authorization Type  3    Authorization Time Period  of 10 progress report    PT Start Time  1309   pt used bathroom   PT Stop Time  1342    PT Time Calculation (min)  33 min    Activity Tolerance  No increased pain;Patient limited by fatigue    Behavior During Therapy  [facility] for tasks assessed/performed       Past Medical History:  Diagnosis Date  . Allergy   . Anemia   . Arthritis    rheumatoid /knees/hands/shoulders (Right shoulder) infusion every 5 weeks  . Bladder incontinence   . Chicken pox   . Dyspnea    with exertion or far walking  . Gastric ulcer   . GERD (gastroesophageal reflux disease)   . Heart murmur   . Hypercholesteremia   . Hyperlipidemia, unspecified   . Hypertension    controlled with meds;   Marland Kitchen MDS (myelodysplastic syndrome) ([place]) 2000  . Memory deficit   . Muscular dystrophy ([place])    per patient, this is incorrect  . Myelodysplastic syndrome ([place])   . Myelodysplastic syndrome ([place])   . Rheumatoid arthritis ([place])   . Ulcer     Past Surgical History:  Procedure Laterality Date  . ABDOMINAL HYSTERECTOMY    . APPENDECTOMY     as a child  . CHOLECYSTECTOMY    . COLONOSCOPY    . COLONOSCOPY WITH PROPOFOL N/A 04/24/2017   Procedure: COLONOSCOPY WITH PROPOFOL;  Surgeon: Lollie Sails, MD;  Location: [facility] ENDOSCOPY;  Service: Endoscopy;  Laterality: N/A;  . ESOPHAGOGASTRODUODENOSCOPY (EGD) WITH PROPOFOL N/A 04/24/2017   Procedure: ESOPHAGOGASTRODUODENOSCOPY (EGD) WITH PROPOFOL;   Surgeon: Lollie Sails, MD;  Location: [facility] ENDOSCOPY;  Service: Endoscopy;  Laterality: N/A;  . JOINT REPLACEMENT    . KNEE ARTHROPLASTY Right 02/21/2017   Procedure: COMPUTER ASSISTED TOTAL KNEE ARTHROPLASTY;  Surgeon: Dereck Leep, MD;  Location: [facility] ORS;  Service: Orthopedics;  Laterality: Right;  . KNEE ARTHROPLASTY Left 08/06/2017   Procedure: COMPUTER ASSISTED TOTAL KNEE ARTHROPLASTY;  Surgeon: Dereck Leep, MD;  Location: [facility] ORS;  Service: Orthopedics;  Laterality: Left;  . NM INTRAVENOUS INJECTION ([facility] HX)     receives biologic infusions every 5 weeks for rheumatoid arthritis. followed by dr. Meda Coffee at [facility]  . TONSILLECTOMY    . UPPER GASTROINTESTINAL ENDOSCOPY    . WISDOM TOOTH EXTRACTION      There were no vitals filed for this visit.  Subjective Assessment - 01/14/19 1310    Subjective  Pt doing ok, L lateral leg hurts though 6-7/10 currently. L knee doesn't bother her a whole lot. Has not done much walking.    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 Pt had L TKA 08/05/2017. Last 3-4 months pt has been having pain L lateral leg. Pt also states feeling pain in her upper back  including her B shoulders. Mid back pain travels down her spine to  her low back and feel radiating pain down her L lateral leg. R LE seems fine.   Denies loss of bowel or bladder control, or saddle anesthesia.  No falls within the last 6 months.  Low back and L lateral leg pain seems to have increased since onset.     Patient Stated Goals  Walk more comfortably. Stand longer than a minute at church.    Currently in Pain?  Yes    Pain Score  7     Pain Onset  More than a month ago                               PT Education - 01/14/19 1320    Education provided  Yes    Education Details  ther-ex    [employer]) Educated  Patient    Methods  Explanation;Demonstration;Tactile cues    Comprehension  Returned demonstration;Verbalized  understanding        Objective   Medbridge Access Code: Access Code: [email or web address]    Pain face scale utilized to help pt with pain scores    Patient arrived late to appointment, modified accordingly   Pt states L knee has been a problems since she was 78 years old    Manual therapy  Seated STM L IT band, vastus lateralis, medial thigh      Therapeutic exercise   Standing hip abduction with B UE assist to promote glute med muscle strengthening.                L 10x2               R 10x2    Seated clamshell isometrics, hips less than 90 degrees flexion  PT manual resistance 10x5 seconds for 3 sets  Seated hip adduction small physioball and glute max squeeze 10x10 seconds   Forward step ups onto Air Ex pad with L LE and B UE assist               2x10 L LE  Lateral step up onto Air Ex pad with B UE assist 10x2        Improved exercise technique, movement at target joints, use of target muscles after mod verbal, visual, tactile cues.       Pt response to treatment Rest breaks provided secondary to fatigueand to allow muscle recovery for next exercise.L knee discomfort at times with standing exercises, eases with sitting rest breaks  Clinical impression Continued working on glute med strengthening in open and closed chain as well as decreasing soft tissue tension L lateral knee to promote better mechanics during gait. Pt will benefit from continued skilled physical therapy services to decrease pain, improve strength, function, and decrease difficulty ambulating.     PT Short Term Goals - 08/08/18 1826      PT SHORT TERM GOAL #1   Title  Patient will be independent with her HEP to improve strength, decrease pain, improve function    Time  3    Period  Weeks    Status  On-going    Target Date  05/23/18        PT Long Term Goals - 01/02/19 1330      PT LONG TERM GOAL #1   Title  Patient will have a decrease in mid to low back pain to  5/10 or less at worst to promote ability to ambulate more comfortably and tolerate standing longer.  Baseline  8/10 back pain at worst for the past 2 months (possible greater than 8/10 at worst based on pt reports but pt seems to have difficulty with the pain scale; 04/30/2018); 5-6/10 mid to low back pain at most for the past month (08/08/2018); 7/10 back pain at worst for the past 7 days (09/24/2018); 6-7/10 back pain at most for the past 7 days (11/05/2018), (12/24/2018)    Time  6    Period  Weeks    Status  On-going    Target Date  02/06/19      PT LONG TERM GOAL #2   Title  Pt will have a decrease in L lateral leg pain to 5/10 or less at worst  to promote ability to ambulate more comfortably and tolerate standing longer.     Baseline  10/10 at worst for the past 2 months (04/30/2018); 7-8/10 L lateral leg pain/tingling at worst for the past month (08/08/2018). 7/10 tingling at most for the past 7 days (09/24/2018); 6-7/10 at most for the past 7 days (11/05/2018), (12/24/2018)    Time  6    Period  Weeks    Status  On-going    Target Date  02/06/19      PT LONG TERM GOAL #3   Title  Patient will have a decrease in L knee joint pain to 5/10 or less at worst  to promote ability to ambulate more comfortably and tolerate standing longer.     Baseline  10/10 L knee pain at worst for the past 2 months (04/30/2018); 8/10 at worst for the past month (08/08/2018); 7-8/10 L knee pain at worst for the past 7 days (09/24/2018); 6-7/10 at worst for the past 7 days (11/05/2018), (12/24/2018)    Time  6    Period  Weeks    Status  On-going    Target Date  02/06/19      PT LONG TERM GOAL #4   Title  Pt will improve B LE strength by at least 1/2 MMT grade  to promote ability to ambulate more comfortably and tolerate standing longer.     Time  6    Period  Weeks    Status  Partially Met    Target Date  02/06/19      PT LONG TERM GOAL #5   Title  Pt will report being able to tolerate standing to 5 minutes or more  to promote ability to stand at church as well as improve ability to perform chores.     Baseline  Pt able to tolerate standing 1 min per subjective reports. (04/30/2018); half a minute per pt (08/08/2018); 2 minutes 8 seconds with light touch assist (09/24/2018); able to stand 2 min and 50 seconds prior to sitting down (11/05/2018). Able to stand at least 6 minutes (12/2018)    Time  6    Period  Weeks    Status  Achieved    Target Date  02/06/19      PT LONG TERM GOAL #6   Title  Patient will improve her TUG time using SPC to 15 seconds or less as a demonstration of improved functional mobility and balance.    Baseline  26.3 seconds on average using SPC (09/30/2018); 17 seconds average with SPC (11/05/2018); 17.48 seconds without the SPC (12/24/2018)    Time  6    Period  Weeks    Status  Partially Met    Target Date  02/06/19  PT LONG TERM GOAL #7   Title  Patient will improve her Merrilee Jansky Balance Test score to 40/56 or more as a demonstration of improved balance and decreased fall risk.    Baseline  33/56 (09/30/2018); 43/56 (11/05/2018), (12/24/18)    Time  6    Period  Weeks    Status  Partially Met    Target Date  02/06/19            Plan - 01/14/19 1949    Clinical Impression Statement  Continued working on glute med strengthening in open and closed chain as well as decreasing soft tissue tension L lateral knee to promote better mechanics during gait. Pt will benefit from continued skilled physical therapy services to decrease pain, improve strength, function, and decrease difficulty ambulating.    Personal Factors and Comorbidities  Age;Comorbidity 3+;Fitness;Past/Current Experience;Time since onset of injury/illness/exacerbation    Comorbidities  Memory deficit, rheumatoid arthritis, dyspnea, arthritis, B TKA, hx of hysterectomy, myelodysplastic syndrome, arthritis multiple areas    Examination-Activity Limitations  Locomotion Level;Carry;Stairs;Stand    Rehab Potential  Fair     Clinical Impairments Affecting Rehab Potential  sedentary lifestyle, age, medical history and chronicity of condition.    PT Frequency  2x / week    PT Duration  6 weeks    PT Treatment/Interventions  Aquatic Therapy;Cryotherapy;Moist Heat;Gait training;Stair training;Functional mobility training;Therapeutic activities;Therapeutic exercise;Patient/family education;Neuromuscular re-education;Balance training;Manual techniques;Taping;Energy conservation;Passive range of motion;Electrical Stimulation;Iontophoresis 72m/ml Dexamethasone;Dry needling    PT Next Visit Plan  Continue with core strengthening, hip strenghtening.    PT Home Exercise Plan  MEast Grand RapidsAccess code: [email or web address]   Consulted and Agree with Plan of Care  Patient    Family Member Consulted  husband       Patient will benefit from skilled therapeutic intervention in order to improve the following deficits and impairments:  Abnormal gait, Pain, Postural dysfunction, Decreased strength, Difficulty walking, Improper body mechanics, Decreased balance  Visit Diagnosis: Chronic pain of left knee  Unsteadiness on feet  Difficulty in walking, not elsewhere classified  Chronic bilateral low back pain, unspecified whether sciatica present  Radiculopathy, lumbosacral region  Pain in thoracic spine  Muscle weakness (generalized)     Problem List Patient Active Problem List   Diagnosis Date Noted  . Protein-calorie malnutrition ([place] 08/27/2017  . 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 Cubital canal compression syndrome, right 04/18/2017  . Primary osteoarthritis of knee 03/25/2017  . 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 Pure hypercholesterolemia 08/17/2016  . MDS (myelodysplastic syndrome) ([place] 05/12/2016  . B12 deficiency 05/04/2016  . Essential hypertension 05/04/2016  . Mixed stress and urge urinary incontinence 05/04/2016  . Rheumatoid arthritis involving multiple sites with positive rheumatoid  factor ([place] 05/04/2016    MJoneen BoersPT, DPT   01/14/2019, 7:57 PM  [facility] [address] [place] [address]Phone: [phone]  Fax:  [phone] Name: JAlizae BechtelMRN: [id number] of Birth: 3Mar 11, 1942

## 2019-01-16 ENCOUNTER — Other Ambulatory Visit: Payer: Self-pay

## 2019-01-16 ENCOUNTER — Ambulatory Visit: Payer: Medicare Other

## 2019-01-16 DIAGNOSIS — G8929 Other chronic pain: Secondary | ICD-10-CM

## 2019-01-16 DIAGNOSIS — R262 Difficulty in walking, not elsewhere classified: Secondary | ICD-10-CM

## 2019-01-16 DIAGNOSIS — M546 Pain in thoracic spine: Secondary | ICD-10-CM

## 2019-01-16 DIAGNOSIS — M6281 Muscle weakness (generalized): Secondary | ICD-10-CM

## 2019-01-16 DIAGNOSIS — M5417 Radiculopathy, lumbosacral region: Secondary | ICD-10-CM

## 2019-01-16 DIAGNOSIS — M25562 Pain in left knee: Secondary | ICD-10-CM

## 2019-01-16 DIAGNOSIS — R2681 Unsteadiness on feet: Secondary | ICD-10-CM

## 2019-01-16 NOTE — Therapy (Signed)
[facility] [address] [place], [address] Phone: [phone]   Fax:  [phone]  Physical Therapy Treatment  Patient Details  Name: Sylvia Miller MRN: [id number] Date of Birth: 03-Aug-1940 Referring Provider (PT): Marlowe Sax, MD   Encounter Date: 01/16/2019  PT End of Session - 01/16/19 1630    Visit Number  44    Number of Visits  61    Date for PT Re-Evaluation  02/06/19    Authorization Type  4    Authorization Time Period  of 10 progress report    PT Start Time  1630    PT Stop Time  1710    PT Time Calculation (min)  40 min    Activity Tolerance  No increased pain;Patient limited by fatigue    Behavior During Therapy  [facility] for tasks assessed/performed       Past Medical History:  Diagnosis Date  . Allergy   . Anemia   . Arthritis    rheumatoid /knees/hands/shoulders (Right shoulder) infusion every 5 weeks  . Bladder incontinence   . Chicken pox   . Dyspnea    with exertion or far walking  . Gastric ulcer   . GERD (gastroesophageal reflux disease)   . Heart murmur   . Hypercholesteremia   . Hyperlipidemia, unspecified   . Hypertension    controlled with meds;   Marland Kitchen MDS (myelodysplastic syndrome) ([place]) 2000  . Memory deficit   . Muscular dystrophy ([place])    per patient, this is incorrect  . Myelodysplastic syndrome ([place])   . Myelodysplastic syndrome ([place])   . Rheumatoid arthritis ([place])   . Ulcer     Past Surgical History:  Procedure Laterality Date  . ABDOMINAL HYSTERECTOMY    . APPENDECTOMY     as a child  . CHOLECYSTECTOMY    . COLONOSCOPY    . COLONOSCOPY WITH PROPOFOL N/A 04/24/2017   Procedure: COLONOSCOPY WITH PROPOFOL;  Surgeon: Lollie Sails, MD;  Location: [facility] ENDOSCOPY;  Service: Endoscopy;  Laterality: N/A;  . ESOPHAGOGASTRODUODENOSCOPY (EGD) WITH PROPOFOL N/A 04/24/2017   Procedure: ESOPHAGOGASTRODUODENOSCOPY (EGD) WITH PROPOFOL;  Surgeon: Lollie Sails, MD;  Location: [facility] ENDOSCOPY;  Service: Endoscopy;  Laterality: N/A;  . JOINT REPLACEMENT    . KNEE ARTHROPLASTY Right 02/21/2017   Procedure: COMPUTER ASSISTED TOTAL KNEE ARTHROPLASTY;  Surgeon: Dereck Leep, MD;  Location: [facility] ORS;  Service: Orthopedics;  Laterality: Right;  . KNEE ARTHROPLASTY Left 08/06/2017   Procedure: COMPUTER ASSISTED TOTAL KNEE ARTHROPLASTY;  Surgeon: Dereck Leep, MD;  Location: [facility] ORS;  Service: Orthopedics;  Laterality: Left;  . NM INTRAVENOUS INJECTION ([facility] HX)     receives biologic infusions every 5 weeks for rheumatoid arthritis. followed by dr. Meda Coffee at [facility]  . TONSILLECTOMY    . UPPER GASTROINTESTINAL ENDOSCOPY    . WISDOM TOOTH EXTRACTION      There were no vitals filed for this visit.  Subjective Assessment - 01/16/19 1631    Subjective  L knee is doing somewhat good. Had a long day.    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 Pt had L TKA 08/05/2017. Last 3-4 months pt has been having pain L lateral leg. Pt also states feeling pain in her upper back  including her B shoulders. Mid back pain travels down her spine to her low back and feel radiating pain down her L lateral leg. R LE seems fine.  Denies loss of bowel or bladder control, or saddle anesthesia.  No falls within the last 6 months.  Low back and L lateral leg pain seems to have increased since onset.     Patient Stated Goals  Walk more comfortably. Stand longer than a minute at church.    Currently in Pain?  Yes   no pain level provided   Pain Onset  More than a month ago                               PT Education - 01/16/19 1643    Education provided  Yes    Education Details  ther-ex    [employer]) Educated  Patient    Methods  Explanation;Demonstration;Tactile cues;Verbal cues    Comprehension  Returned demonstration;Verbalized understanding      Objective   Medbridge Access Code: Access Code: [email or web address]    Pain face scale utilized to help pt with pain scores  Patient arrived late to appointment, modified accordingly   Pt states L knee has been a problems since she was 78 years old    Manual therapy  Seated STM L IT band, vastus lateralis, medial thigh    Therapeutic exercise   Standing hip abduction with B UE assist to promote glute med muscle strengthening.  L 10x2  R 10x2    Forward step ups onto Air Ex pad with L LE and B UE assist  2x10 R LE 2x10 L LE  Lateral step up onto Air Ex pad with B UE assist 10x2   Seated clamshell isometrics, hips less than 90 degrees flexion             PT manual resistance 10x5 seconds for 3 sets  Seated hip adduction small physioball and glute max squeeze 10x10 seconds        Improved exercise technique, movement at target joints, use of target muscles after mod verbal, visual, tactile cues.      Pt response to treatment Rest breaks provided secondary to fatigueand to allow muscle recovery for next exercise.L knee discomfort at times with standing exercises, eases with sitting rest breaks  Clinical impression Possible improved L knee pain based on subjective reports today. Continued working on glute med and quadriceps strengthening and stability as well as improving soft tissue mobility around her L knee to promote better mechanics. Rest breaks needed secondary to fatigue. Pt will benefit from continued skilled physical therapy services to decrease pain, improve strength and function.      PT Short Term Goals - 08/08/18 1826      PT SHORT TERM GOAL #1   Title  Patient will be independent with her HEP to improve strength, decrease pain, improve function    Time  3    Period  Weeks    Status  On-going    Target Date  05/23/18        PT Long Term Goals - 01/02/19 1330      PT LONG TERM GOAL #1   Title  Patient will have a decrease in mid to low  back pain to 5/10 or less at worst to promote ability to ambulate more comfortably and tolerate standing longer.     Baseline  8/10 back pain at worst for the past 2 months (possible greater than 8/10 at worst based on pt reports but pt seems to have difficulty with the pain scale; 04/30/2018); 5-6/10 mid to low back  pain at most for the past month (08/08/2018); 7/10 back pain at worst for the past 7 days (09/24/2018); 6-7/10 back pain at most for the past 7 days (11/05/2018), (12/24/2018)    Time  6    Period  Weeks    Status  On-going    Target Date  02/06/19      PT LONG TERM GOAL #2   Title  Pt will have a decrease in L lateral leg pain to 5/10 or less at worst  to promote ability to ambulate more comfortably and tolerate standing longer.     Baseline  10/10 at worst for the past 2 months (04/30/2018); 7-8/10 L lateral leg pain/tingling at worst for the past month (08/08/2018). 7/10 tingling at most for the past 7 days (09/24/2018); 6-7/10 at most for the past 7 days (11/05/2018), (12/24/2018)    Time  6    Period  Weeks    Status  On-going    Target Date  02/06/19      PT LONG TERM GOAL #3   Title  Patient will have a decrease in L knee joint pain to 5/10 or less at worst  to promote ability to ambulate more comfortably and tolerate standing longer.     Baseline  10/10 L knee pain at worst for the past 2 months (04/30/2018); 8/10 at worst for the past month (08/08/2018); 7-8/10 L knee pain at worst for the past 7 days (09/24/2018); 6-7/10 at worst for the past 7 days (11/05/2018), (12/24/2018)    Time  6    Period  Weeks    Status  On-going    Target Date  02/06/19      PT LONG TERM GOAL #4   Title  Pt will improve B LE strength by at least 1/2 MMT grade  to promote ability to ambulate more comfortably and tolerate standing longer.     Time  6    Period  Weeks    Status  Partially Met    Target Date  02/06/19      PT LONG TERM GOAL #5   Title  Pt will report being able to tolerate standing to 5  minutes or more to promote ability to stand at church as well as improve ability to perform chores.     Baseline  Pt able to tolerate standing 1 min per subjective reports. (04/30/2018); half a minute per pt (08/08/2018); 2 minutes 8 seconds with light touch assist (09/24/2018); able to stand 2 min and 50 seconds prior to sitting down (11/05/2018). Able to stand at least 6 minutes (12/2018)    Time  6    Period  Weeks    Status  Achieved    Target Date  02/06/19      PT LONG TERM GOAL #6   Title  Patient will improve her TUG time using SPC to 15 seconds or less as a demonstration of improved functional mobility and balance.    Baseline  26.3 seconds on average using SPC (09/30/2018); 17 seconds average with SPC (11/05/2018); 17.48 seconds without the SPC (12/24/2018)    Time  6    Period  Weeks    Status  Partially Met    Target Date  02/06/19      PT LONG TERM GOAL #7   Title  Patient will improve her Berg Balance Test score to 40/56 or more as a demonstration of improved balance and decreased fall risk.    Baseline  33/56 (09/30/2018);  43/56 (11/05/2018), (12/24/18)    Time  6    Period  Weeks    Status  Partially Met    Target Date  02/06/19            Plan - 01/16/19 1628    Clinical Impression Statement  Possible improved L knee pain based on subjective reports today. Continued working on glute med and quadriceps strengthening and stability as well as improving soft tissue mobility around her L knee to promote better mechanics. Rest breaks needed secondary to fatigue. Pt will benefit from continued skilled physical therapy services to decrease pain, improve strength and function.    Personal Factors and Comorbidities  Age;Comorbidity 3+;Fitness;Past/Current Experience;Time since onset of injury/illness/exacerbation    Comorbidities  Memory deficit, rheumatoid arthritis, dyspnea, arthritis, B TKA, hx of hysterectomy, myelodysplastic syndrome, arthritis multiple areas    Examination-Activity  Limitations  Locomotion Level;Carry;Stairs;Stand    Rehab Potential  Fair    Clinical Impairments Affecting Rehab Potential  sedentary lifestyle, age, medical history and chronicity of condition.    PT Frequency  2x / week    PT Duration  6 weeks    PT Treatment/Interventions  Aquatic Therapy;Cryotherapy;Moist Heat;Gait training;Stair training;Functional mobility training;Therapeutic activities;Therapeutic exercise;Patient/family education;Neuromuscular re-education;Balance training;Manual techniques;Taping;Energy conservation;Passive range of motion;Electrical Stimulation;Iontophoresis 18m/ml Dexamethasone;Dry needling    PT Next Visit Plan  Continue with core strengthening, hip strenghtening.    PT Home Exercise Plan  [email or web address] code: [email or web address]   Consulted and Agree with Plan of Care  Patient    Family Member Consulted  --       Patient will benefit from skilled therapeutic intervention in order to improve the following deficits and impairments:  Abnormal gait, Pain, Postural dysfunction, Decreased strength, Difficulty walking, Improper body mechanics, Decreased balance  Visit Diagnosis: Chronic pain of left knee  Unsteadiness on feet  Difficulty in walking, not elsewhere classified  Chronic bilateral low back pain, unspecified whether sciatica present  Radiculopathy, lumbosacral region  Pain in thoracic spine  Muscle weakness (generalized)      Problem List Patient Active Problem List   Diagnosis Date Noted  . Protein-calorie malnutrition ([place] 08/27/2017  . 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 Cubital canal compression syndrome, right 04/18/2017  . Primary osteoarthritis of knee 03/25/2017  . 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 Pure hypercholesterolemia 08/17/2016  . MDS (myelodysplastic syndrome) ([place] 05/12/2016  . B12 deficiency 05/04/2016  . Essential hypertension 05/04/2016  . Mixed stress and urge urinary incontinence 05/04/2016  .  Rheumatoid arthritis involving multiple sites with positive rheumatoid factor ([place] 05/04/2016     MJoneen BoersPT, DPT   01/16/2019, 5:27 PM  [facility] [address] [place] [address]Phone: [phone]  Fax:  [phone] Name: Sylvia ResendesMRN: [id number] of Birth: 310-07-1940

## 2019-01-21 ENCOUNTER — Ambulatory Visit: Payer: Medicare Other

## 2019-01-21 ENCOUNTER — Other Ambulatory Visit: Payer: Self-pay

## 2019-01-21 DIAGNOSIS — M25562 Pain in left knee: Secondary | ICD-10-CM | POA: Diagnosis not present

## 2019-01-21 DIAGNOSIS — R2681 Unsteadiness on feet: Secondary | ICD-10-CM

## 2019-01-21 DIAGNOSIS — M5417 Radiculopathy, lumbosacral region: Secondary | ICD-10-CM

## 2019-01-21 DIAGNOSIS — M546 Pain in thoracic spine: Secondary | ICD-10-CM

## 2019-01-21 DIAGNOSIS — R262 Difficulty in walking, not elsewhere classified: Secondary | ICD-10-CM

## 2019-01-21 DIAGNOSIS — G8929 Other chronic pain: Secondary | ICD-10-CM

## 2019-01-21 NOTE — Therapy (Signed)
[facility] [address] [place], [address] Phone: [phone]   Fax:  [phone]  Physical Therapy Treatment  Patient Details  Name: Sylvia Miller MRN: [id number] Date of Birth: 1940-07-11 Referring Provider (PT): Marlowe Sax, MD   Encounter Date: 01/21/2019  PT End of Session - 01/21/19 1304    Visit Number  45    Number of Visits  61    Date for PT Re-Evaluation  02/06/19    Authorization Type  5    Authorization Time Period  of 10 progress report    PT Start Time  1304    PT Stop Time  1345    PT Time Calculation (min)  41 min    Activity Tolerance  No increased pain;Patient limited by fatigue    Behavior During Therapy  [facility] for tasks assessed/performed       Past Medical History:  Diagnosis Date  . Allergy   . Anemia   . Arthritis    rheumatoid /knees/hands/shoulders (Right shoulder) infusion every 5 weeks  . Bladder incontinence   . Chicken pox   . Dyspnea    with exertion or far walking  . Gastric ulcer   . GERD (gastroesophageal reflux disease)   . Heart murmur   . Hypercholesteremia   . Hyperlipidemia, unspecified   . Hypertension    controlled with meds;   Marland Kitchen MDS (myelodysplastic syndrome) ([place]) 2000  . Memory deficit   . Muscular dystrophy ([place])    per patient, this is incorrect  . Myelodysplastic syndrome ([place])   . Myelodysplastic syndrome ([place])   . Rheumatoid arthritis ([place])   . Ulcer     Past Surgical History:  Procedure Laterality Date  . ABDOMINAL HYSTERECTOMY    . APPENDECTOMY     as a child  . CHOLECYSTECTOMY    . COLONOSCOPY    . COLONOSCOPY WITH PROPOFOL N/A 04/24/2017   Procedure: COLONOSCOPY WITH PROPOFOL;  Surgeon: Lollie Sails, MD;  Location: [facility] ENDOSCOPY;  Service: Endoscopy;  Laterality: N/A;  . ESOPHAGOGASTRODUODENOSCOPY (EGD) WITH PROPOFOL N/A 04/24/2017   Procedure: ESOPHAGOGASTRODUODENOSCOPY (EGD) WITH PROPOFOL;  Surgeon: Lollie Sails, MD;  Location: [facility] ENDOSCOPY;  Service: Endoscopy;  Laterality: N/A;  . JOINT REPLACEMENT    . KNEE ARTHROPLASTY Right 02/21/2017   Procedure: COMPUTER ASSISTED TOTAL KNEE ARTHROPLASTY;  Surgeon: Dereck Leep, MD;  Location: [facility] ORS;  Service: Orthopedics;  Laterality: Right;  . KNEE ARTHROPLASTY Left 08/06/2017   Procedure: COMPUTER ASSISTED TOTAL KNEE ARTHROPLASTY;  Surgeon: Dereck Leep, MD;  Location: [facility] ORS;  Service: Orthopedics;  Laterality: Left;  . NM INTRAVENOUS INJECTION ([facility] HX)     receives biologic infusions every 5 weeks for rheumatoid arthritis. followed by dr. Meda Coffee at [facility]  . TONSILLECTOMY    . UPPER GASTROINTESTINAL ENDOSCOPY    . WISDOM TOOTH EXTRACTION      There were no vitals filed for this visit.  Subjective Assessment - 01/21/19 1306    Subjective  L knee and leg are sort of ok. Achy, about the same.    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 Pt had L TKA 08/05/2017. Last 3-4 months pt has been having pain L lateral leg. Pt also states feeling pain in her upper back  including her B shoulders. Mid back pain travels down her spine to her low back and feel radiating pain down her L lateral leg. R LE seems  fine.   Denies loss of bowel or bladder control, or saddle anesthesia.  No falls within the last 6 months.  Low back and L lateral leg pain seems to have increased since onset.     Patient Stated Goals  Walk more comfortably. Stand longer than a minute at church.    Currently in Pain?  Yes   no pain level provided   Pain Onset  More than a month ago                               PT Education - 01/21/19 1311    Education provided  Yes    Education Details  ther-ex    [employer]) Educated  Patient    Methods  Explanation;Demonstration;Tactile cues;Verbal cues    Comprehension  Returned demonstration;Verbalized understanding       Objective   Medbridge Access Code: Access Code:  [email or web address]   Pain face scale utilized to help pt with pain scores  Patient arrived late to appointment, modified accordingly   Pt states L knee has been a problems since she was 78 years old    Manual therapy  Seated STM L IT band, vastus lateralis,medial thigh     Therapeutic exercise    Forward step ups onto Air Ex pad with L LE and B UE assist           2x10L LE  Lateral step up onto Air Ex pad with B UE assist10x2  Standing hip abduction with B UE assist to promote glute med muscle strengthening.  L 10x2  R 10x2  Seated clamshell isometrics, hips less than 90 degrees flexion PT manual resistance 10x5 seconds for 3 sets   Seated hip adduction small physioball and glute max squeeze 10x10 seconds    Improved exercise technique, movement at target joints, use of target muscles after mod verbal, visual, tactile cues.      Pt response to treatment Rest breaks provided secondary to fatigueand to allow muscle recovery for next exercise.L knee discomfort at times with standing exercises, eases with sitting rest breaks  Clinical impression Continued working on glute strengthening and closed chain L LE strengthening to promote stability at her L knee. Also continued working on decreasing tension to soft tissues at her knee to help promote better mechanics during gait and help decrease knee pain. Pt will benefit from continued skilled physical therapy services to decrease pain, improve strength, function, and decrease difficulty with gait. Challenges to progress include dyspnea, sedentary lifestyle, memory deficit and chronicity of condition.      PT Short Term Goals - 08/08/18 1826      PT SHORT TERM GOAL #1   Title  Patient will be independent with her HEP to improve strength, decrease pain, improve function    Time  3    Period  Weeks    Status  On-going    Target Date  05/23/18         PT Long Term Goals - 01/02/19 1330      PT LONG TERM GOAL #1   Title  Patient will have a decrease in mid to low back pain to 5/10 or less at worst to promote ability to ambulate more comfortably and tolerate standing longer.     Baseline  8/10 back pain at worst for the past 2 months (possible greater than 8/10 at worst based on pt reports but pt seems to have difficulty with  the pain scale; 04/30/2018); 5-6/10 mid to low back pain at most for the past month (08/08/2018); 7/10 back pain at worst for the past 7 days (09/24/2018); 6-7/10 back pain at most for the past 7 days (11/05/2018), (12/24/2018)    Time  6    Period  Weeks    Status  On-going    Target Date  02/06/19      PT LONG TERM GOAL #2   Title  Pt will have a decrease in L lateral leg pain to 5/10 or less at worst  to promote ability to ambulate more comfortably and tolerate standing longer.     Baseline  10/10 at worst for the past 2 months (04/30/2018); 7-8/10 L lateral leg pain/tingling at worst for the past month (08/08/2018). 7/10 tingling at most for the past 7 days (09/24/2018); 6-7/10 at most for the past 7 days (11/05/2018), (12/24/2018)    Time  6    Period  Weeks    Status  On-going    Target Date  02/06/19      PT LONG TERM GOAL #3   Title  Patient will have a decrease in L knee joint pain to 5/10 or less at worst  to promote ability to ambulate more comfortably and tolerate standing longer.     Baseline  10/10 L knee pain at worst for the past 2 months (04/30/2018); 8/10 at worst for the past month (08/08/2018); 7-8/10 L knee pain at worst for the past 7 days (09/24/2018); 6-7/10 at worst for the past 7 days (11/05/2018), (12/24/2018)    Time  6    Period  Weeks    Status  On-going    Target Date  02/06/19      PT LONG TERM GOAL #4   Title  Pt will improve B LE strength by at least 1/2 MMT grade  to promote ability to ambulate more comfortably and tolerate standing longer.     Time  6    Period  Weeks    Status   Partially Met    Target Date  02/06/19      PT LONG TERM GOAL #5   Title  Pt will report being able to tolerate standing to 5 minutes or more to promote ability to stand at church as well as improve ability to perform chores.     Baseline  Pt able to tolerate standing 1 min per subjective reports. (04/30/2018); half a minute per pt (08/08/2018); 2 minutes 8 seconds with light touch assist (09/24/2018); able to stand 2 min and 50 seconds prior to sitting down (11/05/2018). Able to stand at least 6 minutes (12/2018)    Time  6    Period  Weeks    Status  Achieved    Target Date  02/06/19      PT LONG TERM GOAL #6   Title  Patient will improve her TUG time using SPC to 15 seconds or less as a demonstration of improved functional mobility and balance.    Baseline  26.3 seconds on average using SPC (09/30/2018); 17 seconds average with SPC (11/05/2018); 17.48 seconds without the SPC (12/24/2018)    Time  6    Period  Weeks    Status  Partially Met    Target Date  02/06/19      PT LONG TERM GOAL #7   Title  Patient will improve her Berg Balance Test score to 40/56 or more as a demonstration of improved balance and decreased  fall risk.    Baseline  33/56 (09/30/2018); 43/56 (11/05/2018), (12/24/18)    Time  6    Period  Weeks    Status  Partially Met    Target Date  02/06/19            Plan - 01/21/19 1303    Clinical Impression Statement  Continued working on glute strengthening and closed chain L LE strengthening to promote stability at her L knee. Also continued working on decreasing tension to soft tissues at her knee to help promote better mechanics during gait and help decrease knee pain. Pt will benefit from continued skilled physical therapy services to decrease pain, improve strength, function, and decrease difficulty with gait. Challenges to progress include dyspnea, sedentary lifestyle, memory deficit and chronicity of condition.    Personal Factors and Comorbidities  Age;Comorbidity  3+;Fitness;Past/Current Experience;Time since onset of injury/illness/exacerbation    Comorbidities  Memory deficit, rheumatoid arthritis, dyspnea, arthritis, B TKA, hx of hysterectomy, myelodysplastic syndrome, arthritis multiple areas    Examination-Activity Limitations  Locomotion Level;Carry;Stairs;Stand    Rehab Potential  Fair    Clinical Impairments Affecting Rehab Potential  sedentary lifestyle, age, medical history and chronicity of condition.    PT Frequency  2x / week    PT Duration  6 weeks    PT Treatment/Interventions  Aquatic Therapy;Cryotherapy;Moist Heat;Gait training;Stair training;Functional mobility training;Therapeutic activities;Therapeutic exercise;Patient/family education;Neuromuscular re-education;Balance training;Manual techniques;Taping;Energy conservation;Passive range of motion;Electrical Stimulation;Iontophoresis 37m/ml Dexamethasone;Dry needling    PT Next Visit Plan  Continue with core strengthening, hip strenghtening.    PT Home Exercise Plan  MBroadviewAccess code: [email or web address]   Consulted and Agree with Plan of Care  Patient       Patient will benefit from skilled therapeutic intervention in order to improve the following deficits and impairments:  Abnormal gait, Pain, Postural dysfunction, Decreased strength, Difficulty walking, Improper body mechanics, Decreased balance  Visit Diagnosis: Chronic pain of left knee  Unsteadiness on feet  Difficulty in walking, not elsewhere classified  Chronic bilateral low back pain, unspecified whether sciatica present  Radiculopathy, lumbosacral region  Pain in thoracic spine     Problem List Patient Active Problem List   Diagnosis Date Noted  . Protein-calorie malnutrition ([place] 08/27/2017  . 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 Cubital canal compression syndrome, right 04/18/2017  . Primary osteoarthritis of knee 03/25/2017  . 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 Pure  hypercholesterolemia 08/17/2016  . MDS (myelodysplastic syndrome) ([place] 05/12/2016  . B12 deficiency 05/04/2016  . Essential hypertension 05/04/2016  . Mixed stress and urge urinary incontinence 05/04/2016  . Rheumatoid arthritis involving multiple sites with positive rheumatoid factor ([place] 05/04/2016    MJoneen BoersPT, DPT   01/21/2019, 4:31 PM  [facility] [address] [place] [address]Phone: [phone]  Fax:  [phone] Name: JGiavanna KangMRN: [id number] of Birth: [birth year]

## 2019-01-23 ENCOUNTER — Ambulatory Visit: Payer: Medicare Other

## 2019-01-23 ENCOUNTER — Other Ambulatory Visit: Payer: Self-pay

## 2019-01-23 DIAGNOSIS — R262 Difficulty in walking, not elsewhere classified: Secondary | ICD-10-CM

## 2019-01-23 DIAGNOSIS — M5417 Radiculopathy, lumbosacral region: Secondary | ICD-10-CM

## 2019-01-23 DIAGNOSIS — M546 Pain in thoracic spine: Secondary | ICD-10-CM

## 2019-01-23 DIAGNOSIS — G8929 Other chronic pain: Secondary | ICD-10-CM

## 2019-01-23 DIAGNOSIS — M6281 Muscle weakness (generalized): Secondary | ICD-10-CM

## 2019-01-23 DIAGNOSIS — R2681 Unsteadiness on feet: Secondary | ICD-10-CM

## 2019-01-23 DIAGNOSIS — M545 Low back pain, unspecified: Secondary | ICD-10-CM

## 2019-01-23 DIAGNOSIS — M25562 Pain in left knee: Secondary | ICD-10-CM | POA: Diagnosis not present

## 2019-01-23 NOTE — Therapy (Signed)
[facility] [address] [place], [address] Phone: [phone]   Fax:  [phone]  Physical Therapy Treatment  Patient Details  Name: Sylvia Miller MRN: [id number] Date of Birth: 1940-06-06 Referring Provider (PT): Marlowe Sax, MD   Encounter Date: 01/23/2019  PT End of Session - 01/23/19 1307    Visit Number  46    Number of Visits  61    Date for PT Re-Evaluation  02/06/19    Authorization Type  6    Authorization Time Period  of 10 progress report    PT Start Time  1306    PT Stop Time  1355    PT Time Calculation (min)  49 min    Equipment Utilized During Treatment  Gait belt    Activity Tolerance  Other (comment)    Behavior During Therapy  WFL for tasks assessed/performed       Past Medical History:  Diagnosis Date  . Allergy   . Anemia   . Arthritis    rheumatoid /knees/hands/shoulders (Right shoulder) infusion every 5 weeks  . Bladder incontinence   . Chicken pox   . Dyspnea    with exertion or far walking  . Gastric ulcer   . GERD (gastroesophageal reflux disease)   . Heart murmur   . Hypercholesteremia   . Hyperlipidemia, unspecified   . Hypertension    controlled with meds;   Marland Kitchen MDS (myelodysplastic syndrome) ([place]) 2000  . Memory deficit   . Muscular dystrophy ([place])    per patient, this is incorrect  . Myelodysplastic syndrome ([place])   . Myelodysplastic syndrome ([place])   . Rheumatoid arthritis ([place])   . Ulcer     Past Surgical History:  Procedure Laterality Date  . ABDOMINAL HYSTERECTOMY    . APPENDECTOMY     as a child  . CHOLECYSTECTOMY    . COLONOSCOPY    . COLONOSCOPY WITH PROPOFOL N/A 04/24/2017   Procedure: COLONOSCOPY WITH PROPOFOL;  Surgeon: Lollie Sails, MD;  Location: [facility] ENDOSCOPY;  Service: Endoscopy;  Laterality: N/A;  . ESOPHAGOGASTRODUODENOSCOPY (EGD) WITH PROPOFOL N/A 04/24/2017   Procedure: ESOPHAGOGASTRODUODENOSCOPY (EGD) WITH PROPOFOL;   Surgeon: Lollie Sails, MD;  Location: [facility] ENDOSCOPY;  Service: Endoscopy;  Laterality: N/A;  . JOINT REPLACEMENT    . KNEE ARTHROPLASTY Right 02/21/2017   Procedure: COMPUTER ASSISTED TOTAL KNEE ARTHROPLASTY;  Surgeon: Dereck Leep, MD;  Location: [facility] ORS;  Service: Orthopedics;  Laterality: Right;  . KNEE ARTHROPLASTY Left 08/06/2017   Procedure: COMPUTER ASSISTED TOTAL KNEE ARTHROPLASTY;  Surgeon: Dereck Leep, MD;  Location: [facility] ORS;  Service: Orthopedics;  Laterality: Left;  . NM INTRAVENOUS INJECTION ([facility] HX)     receives biologic infusions every 5 weeks for rheumatoid arthritis. followed by dr. Meda Coffee at [facility]  . TONSILLECTOMY    . UPPER GASTROINTESTINAL ENDOSCOPY    . WISDOM TOOTH EXTRACTION      There were no vitals filed for this visit.  Subjective Assessment - 01/23/19 1308    Subjective  L knee does not like the chilly weather. 5-6/10 L knee pain currently. Back is doing ok. Did not do a whole lot today.    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 Pt had L TKA 08/05/2017. Last 3-4 months pt has been having pain L lateral leg. Pt also states feeling pain in her upper back  including her B shoulders. Mid back pain travels down  her spine to her low back and feel radiating pain down her L lateral leg. R LE seems fine.   Denies loss of bowel or bladder control, or saddle anesthesia.  No falls within the last 6 months.  Low back and L lateral leg pain seems to have increased since onset.     Patient Stated Goals  Walk more comfortably. Stand longer than a minute at church.    Currently in Pain?  Yes    Pain Score  6     Pain Onset  More than a month ago                               PT Education - 01/23/19 1319    Education provided  Yes    Education Details  ther-ex    [employer]) Educated  Patient    Methods  Explanation;Demonstration;Tactile cues;Verbal cues    Comprehension  Returned  demonstration;Verbalized understanding      Objective   Medbridge Access Code: Access Code: [email or web address]   Pain face scale utilized to help pt with pain scores  Patient arrived late to appointment, modified accordingly   Pt states L knee has been a problems since she was 78 years old     Therapeutic exercise    Standing hip abduction with B UE assist to promote glute med muscle strengthening.  L 10x2  R 10x2   Forward step ups onto 4 inch step with L LE and B UE assist 2x10L LE R pelvic drop, L low back side bend when stepping up with L LE  Pt states that the back of her L thigh bothers her (sciatic distribution). Pt states not knowing when she first felt the soreness.    Seated L hip extension isometrics 10x3 with 5 second holds to promote glute max muscle strengthening and posterior pelvic tilt.   Seated trunk flexion to the R 30 seconds x 3 to decrease low back extension pressure  Seated B scapular retraction to promote thoracic extension and decrease low back extension pressure 10x3 with 5 second holds   Seated manually resisted L hip extension 10x3  to promote glute max muscle strengthening and posterior pelvic tilt.    Seated manually resisted L lateral shift isometrics to help correct R lateral shift posture 10x3 with 5 second holds   Possible L posterior thigh soreness afterwards   seated trunk flexion isometrics, PT manual resistance 10x5 seconds   L medial leg discomfort, eases with rest  Seated trunk extension isometrics, PT manual resistance 10x5 seconds   L posterior leg discomfort, eases with rest  Seated hip adduction ball and glute max squeeze 10x10 seconds for reciprocal inhibition of piriformis muscle  Then 10x5 seconds   Decreased L posterior thigh soreness.   Improved exercise technique, movement at target joints, use of target muscles after mod verbal, visual, tactile cues.        Pt response to treatment Rest breaks provided secondary to fatigueand to allow muscle recovery for next exercise.Decreased L posterior thigh soreness with decreasing extension pressure to low back and decreasing L piriformis muscle activation. Fair tolerance to today's session.    Clinical impression Possible overall improvement in L knee pain based on starting pain level reported. Pt usually reports an average of 7/10 starting pain level. Pt also demonstrates L posterior thigh symptoms along the sciatic nerve distribution in which flexion based positions as well as decreasing piriformis muscle tension  seem to help. Pt will benefit from continued skilled physical therapy services to decrease pain, improve L LE strength, function, and decrease difficulty with gait. Challenges to progress include dyspnea, sedentary lifestyle, memory deficit and chronicity of condition.   PT Short Term Goals - 08/08/18 1826      PT SHORT TERM GOAL #1   Title  Patient will be independent with her HEP to improve strength, decrease pain, improve function    Time  3    Period  Weeks    Status  On-going    Target Date  05/23/18        PT Long Term Goals - 01/02/19 1330      PT LONG TERM GOAL #1   Title  Patient will have a decrease in mid to low back pain to 5/10 or less at worst to promote ability to ambulate more comfortably and tolerate standing longer.     Baseline  8/10 back pain at worst for the past 2 months (possible greater than 8/10 at worst based on pt reports but pt seems to have difficulty with the pain scale; 04/30/2018); 5-6/10 mid to low back pain at most for the past month (08/08/2018); 7/10 back pain at worst for the past 7 days (09/24/2018); 6-7/10 back pain at most for the past 7 days (11/05/2018), (12/24/2018)    Time  6    Period  Weeks    Status  On-going    Target Date  02/06/19      PT LONG TERM GOAL #2   Title  Pt will have a decrease in L lateral leg pain to 5/10 or less  at worst  to promote ability to ambulate more comfortably and tolerate standing longer.     Baseline  10/10 at worst for the past 2 months (04/30/2018); 7-8/10 L lateral leg pain/tingling at worst for the past month (08/08/2018). 7/10 tingling at most for the past 7 days (09/24/2018); 6-7/10 at most for the past 7 days (11/05/2018), (12/24/2018)    Time  6    Period  Weeks    Status  On-going    Target Date  02/06/19      PT LONG TERM GOAL #3   Title  Patient will have a decrease in L knee joint pain to 5/10 or less at worst  to promote ability to ambulate more comfortably and tolerate standing longer.     Baseline  10/10 L knee pain at worst for the past 2 months (04/30/2018); 8/10 at worst for the past month (08/08/2018); 7-8/10 L knee pain at worst for the past 7 days (09/24/2018); 6-7/10 at worst for the past 7 days (11/05/2018), (12/24/2018)    Time  6    Period  Weeks    Status  On-going    Target Date  02/06/19      PT LONG TERM GOAL #4   Title  Pt will improve B LE strength by at least 1/2 MMT grade  to promote ability to ambulate more comfortably and tolerate standing longer.     Time  6    Period  Weeks    Status  Partially Met    Target Date  02/06/19      PT LONG TERM GOAL #5   Title  Pt will report being able to tolerate standing to 5 minutes or more to promote ability to stand at church as well as improve ability to perform chores.     Baseline  Pt able to  tolerate standing 1 min per subjective reports. (04/30/2018); half a minute per pt (08/08/2018); 2 minutes 8 seconds with light touch assist (09/24/2018); able to stand 2 min and 50 seconds prior to sitting down (11/05/2018). Able to stand at least 6 minutes (12/2018)    Time  6    Period  Weeks    Status  Achieved    Target Date  02/06/19      PT LONG TERM GOAL #6   Title  Patient will improve her TUG time using SPC to 15 seconds or less as a demonstration of improved functional mobility and balance.    Baseline  26.3 seconds on  average using SPC (09/30/2018); 17 seconds average with SPC (11/05/2018); 17.48 seconds without the SPC (12/24/2018)    Time  6    Period  Weeks    Status  Partially Met    Target Date  02/06/19      PT LONG TERM GOAL #7   Title  Patient will improve her Berg Balance Test score to 40/56 or more as a demonstration of improved balance and decreased fall risk.    Baseline  33/56 (09/30/2018); 43/56 (11/05/2018), (12/24/18)    Time  6    Period  Weeks    Status  Partially Met    Target Date  02/06/19            Plan - 01/23/19 1320    Clinical Impression Statement  Possible overall improvement in L knee pain based on starting pain level reported. Pt usually reports an average of 7/10 starting pain level. Pt also demonstrates L posterior thigh symptoms along the sciatic nerve distribution in which flexion based positions as well as decreasing piriformis muscle tension seem to help. Pt will benefit from continued skilled physical therapy services to decrease pain, improve L LE strength, function, and decrease difficulty with gait. Challenges to progress include dyspnea, sedentary lifestyle, memory deficit and chronicity of condition.    Personal Factors and Comorbidities  Age;Comorbidity 3+;Fitness;Past/Current Experience;Time since onset of injury/illness/exacerbation    Comorbidities  Memory deficit, rheumatoid arthritis, dyspnea, arthritis, B TKA, hx of hysterectomy, myelodysplastic syndrome, arthritis multiple areas    Examination-Activity Limitations  Locomotion Level;Carry;Stairs;Stand    Rehab Potential  Fair    Clinical Impairments Affecting Rehab Potential  sedentary lifestyle, age, medical history and chronicity of condition.    PT Frequency  2x / week    PT Duration  6 weeks    PT Treatment/Interventions  Aquatic Therapy;Cryotherapy;Moist Heat;Gait training;Stair training;Functional mobility training;Therapeutic activities;Therapeutic exercise;Patient/family education;Neuromuscular  re-education;Balance training;Manual techniques;Taping;Energy conservation;Passive range of motion;Electrical Stimulation;Iontophoresis 48m/ml Dexamethasone;Dry needling    PT Next Visit Plan  Continue with core strengthening, hip strenghtening.    PT Home Exercise Plan  MWastaAccess code: [email or web address]   Consulted and Agree with Plan of Care  Patient       Patient will benefit from skilled therapeutic intervention in order to improve the following deficits and impairments:  Abnormal gait, Pain, Postural dysfunction, Decreased strength, Difficulty walking, Improper body mechanics, Decreased balance  Visit Diagnosis: Chronic pain of left knee  Unsteadiness on feet  Difficulty in walking, not elsewhere classified  Chronic bilateral low back pain, unspecified whether sciatica present  Radiculopathy, lumbosacral region  Pain in thoracic spine  Muscle weakness (generalized)     Problem List Patient Active Problem List   Diagnosis Date Noted  . Protein-calorie malnutrition ([place] 08/27/2017  . 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 Cubital canal compression syndrome,  right 04/18/2017  . Primary osteoarthritis of knee 03/25/2017  . 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 Pure hypercholesterolemia 08/17/2016  . MDS (myelodysplastic syndrome) ([place]) 05/12/2016  . B12 deficiency 05/04/2016  . Essential hypertension 05/04/2016  . Mixed stress and urge urinary incontinence 05/04/2016  . Rheumatoid arthritis involving multiple sites with positive rheumatoid factor ([place]) 05/04/2016     Joneen Boers PT, DPT   01/23/2019, 2:26 PM  [facility] [address] [place], [address] Phone: [phone]   Fax:  [phone]  Name: Sylvia Miller MRN: [id number] Date of Birth: 1940/10/04

## 2019-01-27 ENCOUNTER — Other Ambulatory Visit: Payer: Self-pay

## 2019-01-27 ENCOUNTER — Ambulatory Visit: Payer: Medicare Other

## 2019-01-27 DIAGNOSIS — M546 Pain in thoracic spine: Secondary | ICD-10-CM

## 2019-01-27 DIAGNOSIS — M5417 Radiculopathy, lumbosacral region: Secondary | ICD-10-CM

## 2019-01-27 DIAGNOSIS — M25562 Pain in left knee: Secondary | ICD-10-CM | POA: Diagnosis not present

## 2019-01-27 DIAGNOSIS — M6281 Muscle weakness (generalized): Secondary | ICD-10-CM

## 2019-01-27 DIAGNOSIS — R262 Difficulty in walking, not elsewhere classified: Secondary | ICD-10-CM

## 2019-01-27 DIAGNOSIS — M545 Low back pain, unspecified: Secondary | ICD-10-CM

## 2019-01-27 DIAGNOSIS — G8929 Other chronic pain: Secondary | ICD-10-CM

## 2019-01-27 DIAGNOSIS — R2681 Unsteadiness on feet: Secondary | ICD-10-CM

## 2019-01-27 NOTE — Therapy (Signed)
[facility] [address], [place], [address] Phone: [phone]   Fax:  [phone]  Physical Therapy Treatment  Patient Details  Name: Sylvia Miller MRN: [id number] Date of Birth: 11/01/1940 Referring Provider (PT): Marlowe Sax, MD   Encounter Date: 01/27/2019   PT End of Session - 01/27/19 1302    Visit Number  95    Number of Visits  61    Date for PT Re-Evaluation  02/06/19    Authorization Type  7    Authorization Time Period  of 10 progress report    PT Start Time  1302    PT Stop Time  1343    PT Time Calculation (min)  41 min    Equipment Utilized During Treatment  --    Activity Tolerance  Other (comment)    Behavior During Therapy  WFL for tasks assessed/performed       Past Medical History:  Diagnosis Date  . Allergy   . Anemia   . Arthritis    rheumatoid /knees/hands/shoulders (Right shoulder) infusion every 5 weeks  . Bladder incontinence   . Chicken pox   . Dyspnea    with exertion or far walking  . Gastric ulcer   . GERD (gastroesophageal reflux disease)   . Heart murmur   . Hypercholesteremia   . Hyperlipidemia, unspecified   . Hypertension    controlled with meds;   Marland Kitchen MDS (myelodysplastic syndrome) ([place]) 2000  . Memory deficit   . Muscular dystrophy ([facility] [place])    per patient, this is incorrect  . Myelodysplastic syndrome ([place])   . Myelodysplastic syndrome ([place])   . Rheumatoid arthritis ([place])   . Ulcer     Past Surgical History:  Procedure Laterality Date  . ABDOMINAL HYSTERECTOMY    . APPENDECTOMY     as a child  . CHOLECYSTECTOMY    . COLONOSCOPY    . COLONOSCOPY WITH PROPOFOL N/A 04/24/2017   Procedure: COLONOSCOPY WITH PROPOFOL;  Surgeon: Lollie Sails, MD;  Location: [facility] ENDOSCOPY;  Service: Endoscopy;  Laterality: N/A;  . ESOPHAGOGASTRODUODENOSCOPY (EGD) WITH PROPOFOL N/A 04/24/2017   Procedure: ESOPHAGOGASTRODUODENOSCOPY (EGD) WITH PROPOFOL;   Surgeon: Lollie Sails, MD;  Location: [facility] ENDOSCOPY;  Service: Endoscopy;  Laterality: N/A;  . JOINT REPLACEMENT    . KNEE ARTHROPLASTY Right 02/21/2017   Procedure: COMPUTER ASSISTED TOTAL KNEE ARTHROPLASTY;  Surgeon: Dereck Leep, MD;  Location: [facility] ORS;  Service: Orthopedics;  Laterality: Right;  . KNEE ARTHROPLASTY Left 08/06/2017   Procedure: COMPUTER ASSISTED TOTAL KNEE ARTHROPLASTY;  Surgeon: Dereck Leep, MD;  Location: [facility] ORS;  Service: Orthopedics;  Laterality: Left;  . NM INTRAVENOUS INJECTION ([facility] HX)     receives biologic infusions every 5 weeks for rheumatoid arthritis. followed by dr. Meda Coffee at [facility]  . TONSILLECTOMY    . UPPER GASTROINTESTINAL ENDOSCOPY    . WISDOM TOOTH EXTRACTION      There were no vitals filed for this visit.  Subjective Assessment - 01/27/19 1303    Subjective  6/10 L knee pain while walking. Low back seems to be ok.    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 Pt had L TKA 08/05/2017. Last 3-4 months pt has been having pain L lateral leg. Pt also states feeling pain in her upper back  including her B shoulders. Mid back pain travels down her spine to her low back and feel radiating pain down her  L lateral leg. R LE seems fine.   Denies loss of bowel or bladder control, or saddle anesthesia.  No falls within the last 6 months.  Low back and L lateral leg pain seems to have increased since onset.     Patient Stated Goals  Walk more comfortably. Stand longer than a minute at church.    Currently in Pain?  Yes    Pain Score  6     Pain Onset  More than a month ago                               PT Education - 01/27/19 1307    Education provided  Yes    Education Details  ther-ex    [employer]) Educated  Patient    Methods  Explanation;Demonstration;Tactile cues;Verbal cues    Comprehension  Returned demonstration;Verbalized understanding        Objective   Medbridge Access  Code: Access Code: [email or web address]   Pain face scale utilized to help pt with pain scores  Patient arrived late to appointment, modified accordingly   Pt states L knee has been a problems since she was 78 years old   Manual therapy  Seated STM L IT band, vastus lateralis,medial thigh     Therapeutic exercise    Standing hip abduction with B UE assist to promote glute med muscle strengthening.  L 10x2  R 10x2  Forward step ups onto Air Ex Pad  with L LE and B UE assist 3x10L LE (upgraded number of sets)  Side stepping 5 ft to the L and 5 ft to the R with B UE assist 3 to promote closed chain glute med strengthening  Sit <> stand from regular chair with B UE to no UE assist 5x2  Seated L hip extension isometrics 10x3 with 5 second holds to promote glute max muscle strengthening and posterior pelvic tilt.   Seated hip adduction folded pillow squeeze 10x10 seconds for 2 sets   Improved exercise technique, movement at target joints, use of target muscles after mod verbal, visual, tactile cues.        Pt response to treatment Rest breaks provided secondary to fatigueand to allow muscle recovery for next exercise.    Clinical impression Starting L knee pain seems to be decreasing to 6/10 on average based on the past few sessions. Pt used to start with about 7-8/10 L knee pain. Pt also demonstrates overall improved low back pain based on subjective reports.  Continued working on glute med and max strengthening as well as closed chain L LE strengthing to promote stability at her knee and improve ability to support herself with her L LE when she ambulates. Pt tolerated session well without aggravation of symptoms. Pt will benefit from continued skilled physical therapy services to decrease pain, improve strength, and function.    PT Short Term Goals - 08/08/18 1826      PT SHORT TERM GOAL #1   Title  Patient will be  independent with her HEP to improve strength, decrease pain, improve function    Time  3    Period  Weeks    Status  On-going    Target Date  05/23/18        PT Long Term Goals - 01/02/19 1330      PT LONG TERM GOAL #1   Title  Patient will have a decrease in mid to low back pain  to 5/10 or less at worst to promote ability to ambulate more comfortably and tolerate standing longer.     Baseline  8/10 back pain at worst for the past 2 months (possible greater than 8/10 at worst based on pt reports but pt seems to have difficulty with the pain scale; 04/30/2018); 5-6/10 mid to low back pain at most for the past month (08/08/2018); 7/10 back pain at worst for the past 7 days (09/24/2018); 6-7/10 back pain at most for the past 7 days (11/05/2018), (12/24/2018)    Time  6    Period  Weeks    Status  On-going    Target Date  02/06/19      PT LONG TERM GOAL #2   Title  Pt will have a decrease in L lateral leg pain to 5/10 or less at worst  to promote ability to ambulate more comfortably and tolerate standing longer.     Baseline  10/10 at worst for the past 2 months (04/30/2018); 7-8/10 L lateral leg pain/tingling at worst for the past month (08/08/2018). 7/10 tingling at most for the past 7 days (09/24/2018); 6-7/10 at most for the past 7 days (11/05/2018), (12/24/2018)    Time  6    Period  Weeks    Status  On-going    Target Date  02/06/19      PT LONG TERM GOAL #3   Title  Patient will have a decrease in L knee joint pain to 5/10 or less at worst  to promote ability to ambulate more comfortably and tolerate standing longer.     Baseline  10/10 L knee pain at worst for the past 2 months (04/30/2018); 8/10 at worst for the past month (08/08/2018); 7-8/10 L knee pain at worst for the past 7 days (09/24/2018); 6-7/10 at worst for the past 7 days (11/05/2018), (12/24/2018)    Time  6    Period  Weeks    Status  On-going    Target Date  02/06/19      PT LONG TERM GOAL #4   Title  Pt will improve B LE  strength by at least 1/2 MMT grade  to promote ability to ambulate more comfortably and tolerate standing longer.     Time  6    Period  Weeks    Status  Partially Met    Target Date  02/06/19      PT LONG TERM GOAL #5   Title  Pt will report being able to tolerate standing to 5 minutes or more to promote ability to stand at church as well as improve ability to perform chores.     Baseline  Pt able to tolerate standing 1 min per subjective reports. (04/30/2018); half a minute per pt (08/08/2018); 2 minutes 8 seconds with light touch assist (09/24/2018); able to stand 2 min and 50 seconds prior to sitting down (11/05/2018). Able to stand at least 6 minutes (12/2018)    Time  6    Period  Weeks    Status  Achieved    Target Date  02/06/19      PT LONG TERM GOAL #6   Title  Patient will improve her TUG time using SPC to 15 seconds or less as a demonstration of improved functional mobility and balance.    Baseline  26.3 seconds on average using SPC (09/30/2018); 17 seconds average with SPC (11/05/2018); 17.48 seconds without the SPC (12/24/2018)    Time  6    Period  Weeks    Status  Partially Met    Target Date  02/06/19      PT LONG TERM GOAL #7   Title  Patient will improve her Merrilee Jansky Balance Test score to 40/56 or more as a demonstration of improved balance and decreased fall risk.    Baseline  33/56 (09/30/2018); 43/56 (11/05/2018), (12/24/18)    Time  6    Period  Weeks    Status  Partially Met    Target Date  02/06/19            Plan - 01/27/19 1308    Clinical Impression Statement  Starting L knee pain seems to be decreasing to 6/10 on average based on the past few sessions. Pt used to start with about 7-8/10 L knee pain. Pt also demonstrates overall improved low back pain based on subjective reports.  Continued working on glute med and max strengthening as well as closed chain L LE strengthing to promote stability at her knee and improve ability to support herself with her L LE when she  ambulates. Pt tolerated session well without aggravation of symptoms. Pt will benefit from continued skilled physical therapy services to decrease pain, improve strength, and function.    Personal Factors and Comorbidities  Age;Comorbidity 3+;Fitness;Past/Current Experience;Time since onset of injury/illness/exacerbation    Comorbidities  Memory deficit, rheumatoid arthritis, dyspnea, arthritis, B TKA, hx of hysterectomy, myelodysplastic syndrome, arthritis multiple areas    Examination-Activity Limitations  Locomotion Level;Carry;Stairs;Stand    Rehab Potential  Fair    Clinical Impairments Affecting Rehab Potential  sedentary lifestyle, age, medical history and chronicity of condition.    PT Frequency  2x / week    PT Duration  6 weeks    PT Treatment/Interventions  Aquatic Therapy;Cryotherapy;Moist Heat;Gait training;Stair training;Functional mobility training;Therapeutic activities;Therapeutic exercise;Patient/family education;Neuromuscular re-education;Balance training;Manual techniques;Taping;Energy conservation;Passive range of motion;Electrical Stimulation;Iontophoresis 33m/ml Dexamethasone;Dry needling    PT Next Visit Plan  Continue with core strengthening, hip strenghtening.    PT HBeaverAccess code: [email or web address]   Consulted and Agree with Plan of Care  Patient       Patient will benefit from skilled therapeutic intervention in order to improve the following deficits and impairments:  Abnormal gait, Pain, Postural dysfunction, Decreased strength, Difficulty walking, Improper body mechanics, Decreased balance  Visit Diagnosis: Chronic pain of left knee  Unsteadiness on feet  Difficulty in walking, not elsewhere classified  Chronic bilateral low back pain, unspecified whether sciatica present  Radiculopathy, lumbosacral region  Muscle weakness (generalized)  Pain in thoracic spine     Problem List Patient Active Problem List   Diagnosis Date Noted  .  Protein-calorie malnutrition ([place] 08/27/2017  . 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 Cubital canal compression syndrome, right 04/18/2017  . Primary osteoarthritis of knee 03/25/2017  . 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 Pure hypercholesterolemia 08/17/2016  . MDS (myelodysplastic syndrome) ([place] 05/12/2016  . B12 deficiency 05/04/2016  . Essential hypertension 05/04/2016  . Mixed stress and urge urinary incontinence 05/04/2016  . Rheumatoid arthritis involving multiple sites with positive rheumatoid factor ([place] 05/04/2016    MJoneen BoersPT, DPT   01/27/2019, 1:53 PM  [facility] [address] [place] [id number]: [phone]  Fax:  [phone] Name: Sylvia ArteagaMRN: [id number] of Birth: 310-May-1942

## 2019-01-29 ENCOUNTER — Ambulatory Visit: Payer: Medicare Other

## 2019-01-29 ENCOUNTER — Other Ambulatory Visit: Payer: Self-pay

## 2019-01-29 DIAGNOSIS — M25562 Pain in left knee: Secondary | ICD-10-CM | POA: Diagnosis not present

## 2019-01-29 DIAGNOSIS — R2681 Unsteadiness on feet: Secondary | ICD-10-CM

## 2019-01-29 DIAGNOSIS — M545 Low back pain, unspecified: Secondary | ICD-10-CM

## 2019-01-29 DIAGNOSIS — G8929 Other chronic pain: Secondary | ICD-10-CM

## 2019-01-29 DIAGNOSIS — R262 Difficulty in walking, not elsewhere classified: Secondary | ICD-10-CM

## 2019-01-29 DIAGNOSIS — M5417 Radiculopathy, lumbosacral region: Secondary | ICD-10-CM

## 2019-01-29 DIAGNOSIS — M546 Pain in thoracic spine: Secondary | ICD-10-CM

## 2019-01-29 DIAGNOSIS — M6281 Muscle weakness (generalized): Secondary | ICD-10-CM

## 2019-01-29 NOTE — Therapy (Signed)
[facility] [address] [place], [address] Phone: [phone]   Fax:  [phone]  Physical Therapy Treatment  Patient Details  Name: Sylvia Miller MRN: [id number] Date of Birth: [birth year] Referring Provider (PT): Marlowe Sax, MD   Encounter Date: 01/29/2019  PT End of Session - 01/29/19 1303    Visit Number  48    Number of Visits  61    Date for PT Re-Evaluation  02/06/19    Authorization Type  8    Authorization Time Period  of 10 progress report    PT Start Time  1304    PT Stop Time  1345    PT Time Calculation (min)  41 min    Activity Tolerance  Other (comment)    Behavior During Therapy  [facility] for tasks assessed/performed       Past Medical History:  Diagnosis Date  . Allergy   . Anemia   . Arthritis    rheumatoid /knees/hands/shoulders (Right shoulder) infusion every 5 weeks  . Bladder incontinence   . Chicken pox   . Dyspnea    with exertion or far walking  . Gastric ulcer   . GERD (gastroesophageal reflux disease)   . Heart murmur   . Hypercholesteremia   . Hyperlipidemia, unspecified   . Hypertension    controlled with meds;   Marland Kitchen MDS (myelodysplastic syndrome) ([place]) 2000  . Memory deficit   . Muscular dystrophy ([place])    per patient, this is incorrect  . Myelodysplastic syndrome ([place])   . Myelodysplastic syndrome ([place])   . Rheumatoid arthritis ([place])   . Ulcer     Past Surgical History:  Procedure Laterality Date  . ABDOMINAL HYSTERECTOMY    . APPENDECTOMY     as a child  . CHOLECYSTECTOMY    . COLONOSCOPY    . COLONOSCOPY WITH PROPOFOL N/A 04/24/2017   Procedure: COLONOSCOPY WITH PROPOFOL;  Surgeon: Lollie Sails, MD;  Location: [facility] ENDOSCOPY;  Service: Endoscopy;  Laterality: N/A;  . ESOPHAGOGASTRODUODENOSCOPY (EGD) WITH PROPOFOL N/A 04/24/2017   Procedure: ESOPHAGOGASTRODUODENOSCOPY (EGD) WITH PROPOFOL;  Surgeon: Lollie Sails, MD;  Location: [facility]  ENDOSCOPY;  Service: Endoscopy;  Laterality: N/A;  . JOINT REPLACEMENT    . KNEE ARTHROPLASTY Right 02/21/2017   Procedure: COMPUTER ASSISTED TOTAL KNEE ARTHROPLASTY;  Surgeon: Dereck Leep, MD;  Location: [facility] ORS;  Service: Orthopedics;  Laterality: Right;  . KNEE ARTHROPLASTY Left 08/06/2017   Procedure: COMPUTER ASSISTED TOTAL KNEE ARTHROPLASTY;  Surgeon: Dereck Leep, MD;  Location: [facility] ORS;  Service: Orthopedics;  Laterality: Left;  . NM INTRAVENOUS INJECTION ([facility] HX)     receives biologic infusions every 5 weeks for rheumatoid arthritis. followed by dr. Meda Coffee at [facility]  . TONSILLECTOMY    . UPPER GASTROINTESTINAL ENDOSCOPY    . WISDOM TOOTH EXTRACTION      There were no vitals filed for this visit.  Subjective Assessment - 01/29/19 1305    Subjective  Has a house full of people (including her daughter, grandson and his girlfriend).  L knee is not too bad. Does not like the cold weather. 5-6/10 L knee pain currently.    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 Pt had L TKA 08/05/2017. Last 3-4 months pt has been having pain L lateral leg. Pt also states feeling pain in her upper back  including her B shoulders. Mid back pain travels down her spine to  her low back and feel radiating pain down her L lateral leg. R LE seems fine.   Denies loss of bowel or bladder control, or saddle anesthesia.  No falls within the last 6 months.  Low back and L lateral leg pain seems to have increased since onset.     Patient Stated Goals  Walk more comfortably. Stand longer than a minute at church.    Currently in Pain?  Yes    Pain Score  6    5-6/10 L knee pain currently   Pain Onset  More than a month ago                               PT Education - 01/29/19 1304    Education provided  Yes    Education Details  ther-ex    [employer]) Educated  Patient    Methods  Explanation;Demonstration;Tactile cues;Verbal cues    Comprehension   Returned demonstration;Verbalized understanding      Objective   Medbridge Access Code: Access Code: [email or web address]   Pain face scale utilized to help pt with pain scores  Patient arrived late to appointment, modified accordingly   Pt states L knee has been a problems since she was 78 years old   Manual therapy  Seated STM L IT band, vastus lateralis,medial thigh      Therapeutic exercise    Standing hip abduction with B UE assist to promote glute med muscle strengthening.  L 10x2 R 10x2with L LE on Air Ex surface  Lateral step up onto Air Ex surface with L LE and B UE assist   L 10x2   Forward step ups onto Air Ex Pad with L LE and B UE assist 3x10L LE   On Air Ex beam: Side stepping 5 ft to the L and 5 ft to the R with B UE assist 2x to promote closed chain glute med strengthening   Seated L hip extension isometrics 10x2 with 5 second holds to promote glute max muscle strengthening and posterior pelvic tilt.   Improved exercise technique, movement at target joints, use of target muscles after mod verbal, visual, tactile cues.       Pt response to treatment Rest breaks provided secondary to fatigueand to allow muscle recovery for next exercise.    Clinical impression Pt averaging 5-6/10 L knee pain at start of sessions based on the past 3 sessions. Continued working on closed chain L LE strengthening on uneven surface to promote glute med and quad strength as well as stability to her L knee. Fair tolerance to today's session. Pt will benefit from continued skilled physical therapy services to decrease pain, improve strength and function.     PT Short Term Goals - 08/08/18 1826      PT SHORT TERM GOAL #1   Title  Patient will be independent with her HEP to improve strength, decrease pain, improve function    Time  3    Period  Weeks    Status  On-going    Target Date  05/23/18         PT Long Term Goals - 01/02/19 1330      PT LONG TERM GOAL #1   Title  Patient will have a decrease in mid to low back pain to 5/10 or less at worst to promote ability to ambulate more comfortably and tolerate standing longer.     Baseline  8/10 back  pain at worst for the past 2 months (possible greater than 8/10 at worst based on pt reports but pt seems to have difficulty with the pain scale; 04/30/2018); 5-6/10 mid to low back pain at most for the past month (08/08/2018); 7/10 back pain at worst for the past 7 days (09/24/2018); 6-7/10 back pain at most for the past 7 days (11/05/2018), (12/24/2018)    Time  6    Period  Weeks    Status  On-going    Target Date  02/06/19      PT LONG TERM GOAL #2   Title  Pt will have a decrease in L lateral leg pain to 5/10 or less at worst  to promote ability to ambulate more comfortably and tolerate standing longer.     Baseline  10/10 at worst for the past 2 months (04/30/2018); 7-8/10 L lateral leg pain/tingling at worst for the past month (08/08/2018). 7/10 tingling at most for the past 7 days (09/24/2018); 6-7/10 at most for the past 7 days (11/05/2018), (12/24/2018)    Time  6    Period  Weeks    Status  On-going    Target Date  02/06/19      PT LONG TERM GOAL #3   Title  Patient will have a decrease in L knee joint pain to 5/10 or less at worst  to promote ability to ambulate more comfortably and tolerate standing longer.     Baseline  10/10 L knee pain at worst for the past 2 months (04/30/2018); 8/10 at worst for the past month (08/08/2018); 7-8/10 L knee pain at worst for the past 7 days (09/24/2018); 6-7/10 at worst for the past 7 days (11/05/2018), (12/24/2018)    Time  6    Period  Weeks    Status  On-going    Target Date  02/06/19      PT LONG TERM GOAL #4   Title  Pt will improve B LE strength by at least 1/2 MMT grade  to promote ability to ambulate more comfortably and tolerate standing longer.     Time  6    Period  Weeks    Status  Partially  Met    Target Date  02/06/19      PT LONG TERM GOAL #5   Title  Pt will report being able to tolerate standing to 5 minutes or more to promote ability to stand at church as well as improve ability to perform chores.     Baseline  Pt able to tolerate standing 1 min per subjective reports. (04/30/2018); half a minute per pt (08/08/2018); 2 minutes 8 seconds with light touch assist (09/24/2018); able to stand 2 min and 50 seconds prior to sitting down (11/05/2018). Able to stand at least 6 minutes (12/2018)    Time  6    Period  Weeks    Status  Achieved    Target Date  02/06/19      PT LONG TERM GOAL #6   Title  Patient will improve her TUG time using SPC to 15 seconds or less as a demonstration of improved functional mobility and balance.    Baseline  26.3 seconds on average using SPC (09/30/2018); 17 seconds average with SPC (11/05/2018); 17.48 seconds without the SPC (12/24/2018)    Time  6    Period  Weeks    Status  Partially Met    Target Date  02/06/19      PT LONG TERM GOAL #  7   Title  Patient will improve her Merrilee Jansky Balance Test score to 40/56 or more as a demonstration of improved balance and decreased fall risk.    Baseline  33/56 (09/30/2018); 43/56 (11/05/2018), (12/24/18)    Time  6    Period  Weeks    Status  Partially Met    Target Date  02/06/19            Plan - 01/29/19 1303    Clinical Impression Statement  Pt averaging 5-6/10 L knee pain at start of sessions based on the past 3 sessions. Continued working on closed chain L LE strengthening on uneven surface to promote glute med and quad strength as well as stability to her L knee. Fair tolerance to today's session. Pt will benefit from continued skilled physical therapy services to decrease pain, improve strength and function.    Personal Factors and Comorbidities  Age;Comorbidity 3+;Fitness;Past/Current Experience;Time since onset of injury/illness/exacerbation    Comorbidities  Memory deficit, rheumatoid arthritis,  dyspnea, arthritis, B TKA, hx of hysterectomy, myelodysplastic syndrome, arthritis multiple areas    Examination-Activity Limitations  Locomotion Level;Carry;Stairs;Stand    Rehab Potential  Fair    Clinical Impairments Affecting Rehab Potential  sedentary lifestyle, age, medical history and chronicity of condition.    PT Frequency  2x / week    PT Duration  6 weeks    PT Treatment/Interventions  Aquatic Therapy;Cryotherapy;Moist Heat;Gait training;Stair training;Functional mobility training;Therapeutic activities;Therapeutic exercise;Patient/family education;Neuromuscular re-education;Balance training;Manual techniques;Taping;Energy conservation;Passive range of motion;Electrical Stimulation;Iontophoresis 32m/ml Dexamethasone;Dry needling    PT Next Visit Plan  Continue with core strengthening, hip strenghtening.    PT HMedinaAccess code: [email or web address]   Consulted and Agree with Plan of Care  Patient       Patient will benefit from skilled therapeutic intervention in order to improve the following deficits and impairments:  Abnormal gait, Pain, Postural dysfunction, Decreased strength, Difficulty walking, Improper body mechanics, Decreased balance  Visit Diagnosis: Chronic pain of left knee  Unsteadiness on feet  Difficulty in walking, not elsewhere classified  Chronic bilateral low back pain, unspecified whether sciatica present  Radiculopathy, lumbosacral region  Muscle weakness (generalized)  Pain in thoracic spine     Problem List Patient Active Problem List   Diagnosis Date Noted  . Protein-calorie malnutrition ([place] 08/27/2017  . 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 Cubital canal compression syndrome, right 04/18/2017  . Primary osteoarthritis of knee 03/25/2017  . 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 Pure hypercholesterolemia 08/17/2016  . MDS (myelodysplastic syndrome) ([place] 05/12/2016  . B12 deficiency 05/04/2016  .  Essential hypertension 05/04/2016  . Mixed stress and urge urinary incontinence 05/04/2016  . Rheumatoid arthritis involving multiple sites with positive rheumatoid factor ([place] 05/04/2016    MJoneen BoersPT, DPT   01/29/2019, 3:04 PM  [facility] [address] [place] [address]Phone: [phone]  Fax:  [phone] Name: Sylvia StigerMRN: [id number] of Birth: [birth year]

## 2019-02-04 ENCOUNTER — Other Ambulatory Visit: Payer: Self-pay

## 2019-02-04 ENCOUNTER — Ambulatory Visit: Payer: Medicare Other | Attending: Internal Medicine

## 2019-02-04 DIAGNOSIS — M545 Low back pain, unspecified: Secondary | ICD-10-CM

## 2019-02-04 DIAGNOSIS — M5417 Radiculopathy, lumbosacral region: Secondary | ICD-10-CM

## 2019-02-04 DIAGNOSIS — M546 Pain in thoracic spine: Secondary | ICD-10-CM

## 2019-02-04 DIAGNOSIS — R262 Difficulty in walking, not elsewhere classified: Secondary | ICD-10-CM

## 2019-02-04 DIAGNOSIS — M6281 Muscle weakness (generalized): Secondary | ICD-10-CM

## 2019-02-04 DIAGNOSIS — G8929 Other chronic pain: Secondary | ICD-10-CM

## 2019-02-04 DIAGNOSIS — R2681 Unsteadiness on feet: Secondary | ICD-10-CM | POA: Diagnosis present

## 2019-02-04 DIAGNOSIS — M25562 Pain in left knee: Secondary | ICD-10-CM

## 2019-02-04 NOTE — Therapy (Signed)
[place] [facility] [address], [place], [address] Phone: [phone]   Fax:  [phone]  Physical Therapy Treatment  Patient Details  Name: Sylvia Miller MRN: [id number] Date of Birth: 23-Jan-1941 Referring Provider (PT): Marlowe Sax, MD   Encounter Date: 02/04/2019  PT End of Session - 02/04/19 1340    Visit Number  58    Number of Visits  61    Date for PT Re-Evaluation  02/06/19    Authorization Type  9    Authorization Time Period  of 10 progress report    PT Start Time  1305    PT Stop Time  1346    PT Time Calculation (min)  41 min    Activity Tolerance  Other (comment)    Behavior During Therapy  [facility] for tasks assessed/performed       Past Medical History:  Diagnosis Date  . Allergy   . Anemia   . Arthritis    rheumatoid /knees/hands/shoulders (Right shoulder) infusion every 5 weeks  . Bladder incontinence   . Chicken pox   . Dyspnea    with exertion or far walking  . Gastric ulcer   . GERD (gastroesophageal reflux disease)   . Heart murmur   . Hypercholesteremia   . Hyperlipidemia, unspecified   . Hypertension    controlled with meds;   Marland Kitchen MDS (myelodysplastic syndrome) ([place]) 2000  . Memory deficit   . Muscular dystrophy ([place])    per patient, this is incorrect  . Myelodysplastic syndrome ([place])   . Myelodysplastic syndrome ([place])   . Rheumatoid arthritis ([place])   . Ulcer     Past Surgical History:  Procedure Laterality Date  . ABDOMINAL HYSTERECTOMY    . APPENDECTOMY     as a child  . CHOLECYSTECTOMY    . COLONOSCOPY    . COLONOSCOPY WITH PROPOFOL N/A 04/24/2017   Procedure: COLONOSCOPY WITH PROPOFOL;  Surgeon: Lollie Sails, MD;  Location: [facility] ENDOSCOPY;  Service: Endoscopy;  Laterality: N/A;  . ESOPHAGOGASTRODUODENOSCOPY (EGD) WITH PROPOFOL N/A 04/24/2017   Procedure: ESOPHAGOGASTRODUODENOSCOPY (EGD) WITH PROPOFOL;  Surgeon: Lollie Sails, MD;  Location: [facility]  ENDOSCOPY;  Service: Endoscopy;  Laterality: N/A;  . JOINT REPLACEMENT    . KNEE ARTHROPLASTY Right 02/21/2017   Procedure: COMPUTER ASSISTED TOTAL KNEE ARTHROPLASTY;  Surgeon: Dereck Leep, MD;  Location: [facility] ORS;  Service: Orthopedics;  Laterality: Right;  . KNEE ARTHROPLASTY Left 08/06/2017   Procedure: COMPUTER ASSISTED TOTAL KNEE ARTHROPLASTY;  Surgeon: Dereck Leep, MD;  Location: [facility] ORS;  Service: Orthopedics;  Laterality: Left;  . NM INTRAVENOUS INJECTION ([facility] HX)     receives biologic infusions every 5 weeks for rheumatoid arthritis. followed by dr. Meda Coffee at [facility]  . TONSILLECTOMY    . UPPER GASTROINTESTINAL ENDOSCOPY    . WISDOM TOOTH EXTRACTION      There were no vitals filed for this visit.  Subjective Assessment - 02/04/19 1306    Subjective  Not a whole lot of pain, just achy, 5-6/10 L knee pain currently and 6-7/10 at most for the past 7 days. Not really having back pain at the moment. L lateral leg symptoms at the moment. Bending down bothers her back. 7/10 back pain at most for the past 7 days.  No falls recently.    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 Pt had L TKA 08/05/2017. Last 3-4 months pt has been having pain  L lateral leg. Pt also states feeling pain in her upper back  including her B shoulders. Mid back pain travels down her spine to her low back and feel radiating pain down her L lateral leg. R LE seems fine.   Denies loss of bowel or bladder control, or saddle anesthesia.  No falls within the last 6 months.  Low back and L lateral leg pain seems to have increased since onset.     Patient Stated Goals  Walk more comfortably. Stand longer than a minute at church.    Currently in Pain?  Yes    Pain Score  6    L knee   Pain Onset  More than a month ago         [facility] PT Assessment - 02/04/19 1317      Observation/Other Assessments   Observations  TUG with SPC 17 seconds average      Strength   Right Hip  Flexion  4+/5    Right Hip Extension  4+/5   seated manually resisted   Right Hip ABduction  5/5   seated manually resisted clamshell isometrics   Left Hip Flexion  5/5    Left Hip Extension  4+/5   seated manually resisted   Left Hip ABduction  5/5   seated manually resisted clamshell isometrics   Right Knee Flexion  4/5    Right Knee Extension  5/5    Left Knee Flexion  4/5    Left Knee Extension  5/5                           PT Education - 02/04/19 1718    Education provided  Yes    Education Details  ther-ex, HEP    Person(s) Educated  Patient    Methods  Explanation;Demonstration;Tactile cues;Verbal cues;Handout    Comprehension  Returned demonstration;Verbalized understanding        Objective   Medbridge Access Code: Access Code: [email or web address]   Pain face scale utilized to help pt with pain scores  Patient arrived late to appointment, modified accordingly   Pt states L knee has been a problems since she was 78 years old   Therapeutic exercise    Standing hip abduction with B UE assist to promote glute med muscle strengthening.  L 10x2 R 10x2  seated L hip extension isometrics 10x5 seconds for 3 sets  Seated manually resisted hip flexion, hip extension, clamshell isometric, knee flexion, knee extension 1-2x each LE  Standing up from a chair, walking 10 ft forward, then returning 10 ft, then sitting back onto chair 3x  With SPC 18 seconds, 16 seconds, 17 seconds (17 seconds on average with SPC)  Lateral step up onto Air Ex surface with L LE and B UE assist              L 10x             Forward step ups ontoAir Ex Padwith L LE and B UE assist 2x10L LE  Reviewed HEP with pt. Pt and husband/caregiver verbalized understanding.    Improved exercise technique, movement at target joints, use of target muscles after mod verbal, visual, tactile cues.      Pt response to  treatment Rest breaks provided secondary to fatigueand to allow muscle recovery for next exercise. Pt tolerated session well without aggravation of symptoms.     Clinical impression Improved B hip abduction and L hip flexion  strength since last measured on 11/07/2018. Able to maintain average of 17 seconds TUG time using SPC. Able to maintain 5-6/10 L knee pain starting level average for the past few sessions (used to average 6-8/10 starting pain levels). Pt tolerated session well without aggravation of symptoms. Pt will benefit from continued skilled physical therapy services to decrease pain, improve strength and function.      PT Short Term Goals - 08/08/18 1826      PT SHORT TERM GOAL #1   Title  Patient will be independent with her HEP to improve strength, decrease pain, improve function    Time  3    Period  Weeks    Status  On-going    Target Date  05/23/18        PT Long Term Goals - 01/02/19 1330      PT LONG TERM GOAL #1   Title  Patient will have a decrease in mid to low back pain to 5/10 or less at worst to promote ability to ambulate more comfortably and tolerate standing longer.     Baseline  8/10 back pain at worst for the past 2 months (possible greater than 8/10 at worst based on pt reports but pt seems to have difficulty with the pain scale; 04/30/2018); 5-6/10 mid to low back pain at most for the past month (08/08/2018); 7/10 back pain at worst for the past 7 days (09/24/2018); 6-7/10 back pain at most for the past 7 days (11/05/2018), (12/24/2018)    Time  6    Period  Weeks    Status  On-going    Target Date  02/06/19      PT LONG TERM GOAL #2   Title  Pt will have a decrease in L lateral leg pain to 5/10 or less at worst  to promote ability to ambulate more comfortably and tolerate standing longer.     Baseline  10/10 at worst for the past 2 months (04/30/2018); 7-8/10 L lateral leg pain/tingling at worst for the past month (08/08/2018). 7/10 tingling at most for the  past 7 days (09/24/2018); 6-7/10 at most for the past 7 days (11/05/2018), (12/24/2018)    Time  6    Period  Weeks    Status  On-going    Target Date  02/06/19      PT LONG TERM GOAL #3   Title  Patient will have a decrease in L knee joint pain to 5/10 or less at worst  to promote ability to ambulate more comfortably and tolerate standing longer.     Baseline  10/10 L knee pain at worst for the past 2 months (04/30/2018); 8/10 at worst for the past month (08/08/2018); 7-8/10 L knee pain at worst for the past 7 days (09/24/2018); 6-7/10 at worst for the past 7 days (11/05/2018), (12/24/2018)    Time  6    Period  Weeks    Status  On-going    Target Date  02/06/19      PT LONG TERM GOAL #4   Title  Pt will improve B LE strength by at least 1/2 MMT grade  to promote ability to ambulate more comfortably and tolerate standing longer.     Time  6    Period  Weeks    Status  Partially Met    Target Date  02/06/19      PT LONG TERM GOAL #5   Title  Pt will report being able to tolerate standing to  5 minutes or more to promote ability to stand at church as well as improve ability to perform chores.     Baseline  Pt able to tolerate standing 1 min per subjective reports. (04/30/2018); half a minute per pt (08/08/2018); 2 minutes 8 seconds with light touch assist (09/24/2018); able to stand 2 min and 50 seconds prior to sitting down (11/05/2018). Able to stand at least 6 minutes (12/2018)    Time  6    Period  Weeks    Status  Achieved    Target Date  02/06/19      PT LONG TERM GOAL #6   Title  Patient will improve her TUG time using SPC to 15 seconds or less as a demonstration of improved functional mobility and balance.    Baseline  26.3 seconds on average using SPC (09/30/2018); 17 seconds average with SPC (11/05/2018); 17.48 seconds without the SPC (12/24/2018)    Time  6    Period  Weeks    Status  Partially Met    Target Date  02/06/19      PT LONG TERM GOAL #7   Title  Patient will improve her Berg  Balance Test score to 40/56 or more as a demonstration of improved balance and decreased fall risk.    Baseline  33/56 (09/30/2018); 43/56 (11/05/2018), (12/24/18)    Time  6    Period  Weeks    Status  Partially Met    Target Date  02/06/19            Plan - 02/04/19 1302    Clinical Impression Statement  Improved B hip abduction and L hip flexion strength since last measured on 11/07/2018. Able to maintain average of 17 seconds TUG time using SPC. Able to maintain 5-6/10 L knee pain starting level average for the past few sessions (used to average 6-8/10 starting pain levels). Pt tolerated session well without aggravation of symptoms. Pt will benefit from continued skilled physical therapy services to decrease pain, improve strength and function.    Personal Factors and Comorbidities  Age;Comorbidity 3+;Fitness;Past/Current Experience;Time since onset of injury/illness/exacerbation    Comorbidities  Memory deficit, rheumatoid arthritis, dyspnea, arthritis, B TKA, hx of hysterectomy, myelodysplastic syndrome, arthritis multiple areas    Examination-Activity Limitations  Locomotion Level;Carry;Stairs;Stand    Rehab Potential  Fair    Clinical Impairments Affecting Rehab Potential  sedentary lifestyle, age, medical history and chronicity of condition.    PT Frequency  2x / week    PT Duration  6 weeks    PT Treatment/Interventions  Aquatic Therapy;Cryotherapy;Moist Heat;Gait training;Stair training;Functional mobility training;Therapeutic activities;Therapeutic exercise;Patient/family education;Neuromuscular re-education;Balance training;Manual techniques;Taping;Energy conservation;Passive range of motion;Electrical Stimulation;Iontophoresis 72m/ml Dexamethasone;Dry needling    PT Next Visit Plan  Continue with core strengthening, hip strenghtening.    PT HDeltaAccess code: [email or web address]   Consulted and Agree with Plan of Care  Patient       Patient will benefit from  skilled therapeutic intervention in order to improve the following deficits and impairments:  Abnormal gait, Pain, Postural dysfunction, Decreased strength, Difficulty walking, Improper body mechanics, Decreased balance  Visit Diagnosis: Chronic pain of left knee  Unsteadiness on feet  Difficulty in walking, not elsewhere classified  Chronic bilateral low back pain, unspecified whether sciatica present  Radiculopathy, lumbosacral region  Muscle weakness (generalized)  Pain in thoracic spine     Problem List Patient Active Problem List   Diagnosis Date Noted  . Protein-calorie malnutrition ([place]rient  08/27/2017  . 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 Cubital canal compression syndrome, right 04/18/2017  . Primary osteoarthritis of knee 03/25/2017  . 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 Pure hypercholesterolemia 08/17/2016  . MDS (myelodysplastic syndrome) ([place]) 05/12/2016  . B12 deficiency 05/04/2016  . Essential hypertension 05/04/2016  . Mixed stress and urge urinary incontinence 05/04/2016  . Rheumatoid arthritis involving multiple sites with positive rheumatoid factor ([place]) 05/04/2016    Joneen Boers PT, DPT   02/04/2019, 5:27 PM  [place] [address], [place], [address] Phone: [phone]   Fax:  [phone]  Name: Sylvia Miller MRN: [id number] Date of Birth: [birth year]

## 2019-02-04 NOTE — Patient Instructions (Signed)
Seated hip extension isometrics ? ? ?Sitting on a chair,  ? ? Squeeze your rear end muscles together and press your L foot onto the floor.  ? ? ? Hold for 5 seconds  ? ? Repeat 10 times ? ? Perform 3 sets daily.  ? ? ? ? This is a corrective exercise. Once you no longer have symptoms, you can stop.  ? ?

## 2019-02-06 ENCOUNTER — Other Ambulatory Visit: Payer: Self-pay

## 2019-02-06 ENCOUNTER — Ambulatory Visit: Payer: Medicare Other

## 2019-02-06 DIAGNOSIS — M5417 Radiculopathy, lumbosacral region: Secondary | ICD-10-CM

## 2019-02-06 DIAGNOSIS — M546 Pain in thoracic spine: Secondary | ICD-10-CM

## 2019-02-06 DIAGNOSIS — M25562 Pain in left knee: Secondary | ICD-10-CM

## 2019-02-06 DIAGNOSIS — R262 Difficulty in walking, not elsewhere classified: Secondary | ICD-10-CM

## 2019-02-06 DIAGNOSIS — R2681 Unsteadiness on feet: Secondary | ICD-10-CM

## 2019-02-06 DIAGNOSIS — G8929 Other chronic pain: Secondary | ICD-10-CM

## 2019-02-06 DIAGNOSIS — M6281 Muscle weakness (generalized): Secondary | ICD-10-CM

## 2019-02-06 NOTE — Therapy (Signed)
[place] [facility] [address], [place], [address] Phone: [phone]   Fax:  [phone]  Physical Therapy Treatment And Progress Report (01/07/2019 - 02/06/2019)  Patient Details  Name: Sylvia Miller MRN: [id number] Date of Birth: 1940-06-11 Referring Provider (PT): Marlowe Sax, MD   Encounter Date: 02/06/2019  PT End of Session - 02/06/19 1303    Visit Number  50    Number of Visits  64    Date for PT Re-Evaluation  03/20/19    Authorization Type  10    Authorization Time Period  of 10 progress report    PT Start Time  1304   pt used the bathroom before PT   PT Stop Time  1345    PT Time Calculation (min)  41 min    Activity Tolerance  Other (comment)    Behavior During Therapy  [facility] for tasks assessed/performed       Past Medical History:  Diagnosis Date  . Allergy   . Anemia   . Arthritis    rheumatoid /knees/hands/shoulders (Right shoulder) infusion every 5 weeks  . Bladder incontinence   . Chicken pox   . Dyspnea    with exertion or far walking  . Gastric ulcer   . GERD (gastroesophageal reflux disease)   . Heart murmur   . Hypercholesteremia   . Hyperlipidemia, unspecified   . Hypertension    controlled with meds;   Marland Kitchen MDS (myelodysplastic syndrome) ([place]) 2000  . Memory deficit   . Muscular dystrophy ([place])    per patient, this is incorrect  . Myelodysplastic syndrome ([place])   . Myelodysplastic syndrome ([place])   . Rheumatoid arthritis ([place])   . Ulcer     Past Surgical History:  Procedure Laterality Date  . ABDOMINAL HYSTERECTOMY    . APPENDECTOMY     as a child  . CHOLECYSTECTOMY    . COLONOSCOPY    . COLONOSCOPY WITH PROPOFOL N/A 04/24/2017   Procedure: COLONOSCOPY WITH PROPOFOL;  Surgeon: Lollie Sails, MD;  Location: [facility] ENDOSCOPY;  Service: Endoscopy;  Laterality: N/A;  . ESOPHAGOGASTRODUODENOSCOPY (EGD) WITH PROPOFOL N/A 04/24/2017   Procedure:  ESOPHAGOGASTRODUODENOSCOPY (EGD) WITH PROPOFOL;  Surgeon: Lollie Sails, MD;  Location: [facility] ENDOSCOPY;  Service: Endoscopy;  Laterality: N/A;  . JOINT REPLACEMENT    . KNEE ARTHROPLASTY Right 02/21/2017   Procedure: COMPUTER ASSISTED TOTAL KNEE ARTHROPLASTY;  Surgeon: Dereck Leep, MD;  Location: [facility] ORS;  Service: Orthopedics;  Laterality: Right;  . KNEE ARTHROPLASTY Left 08/06/2017   Procedure: COMPUTER ASSISTED TOTAL KNEE ARTHROPLASTY;  Surgeon: Dereck Leep, MD;  Location: [facility] ORS;  Service: Orthopedics;  Laterality: Left;  . NM INTRAVENOUS INJECTION ([facility] HX)     receives biologic infusions every 5 weeks for rheumatoid arthritis. followed by dr. Meda Coffee at [facility]  . TONSILLECTOMY    . UPPER GASTROINTESTINAL ENDOSCOPY    . WISDOM TOOTH EXTRACTION      There were no vitals filed for this visit.  Subjective Assessment - 02/06/19 1306    Subjective  Doing ok. L knee hurts a little. Does not like the cold weather. 6/10 L knee pain currently at at worst for the past 7 days. 6/10 back pain currently, and 6-7/10 back pain at most for the past 7 days. 7-8/10 L lateral leg pain currently and 8/10 at most for the past 7 days. Did her HEP. Gets tired too fast.Pt states that she thinks she can walk a  little bit more in the house. Wants to continued PT because she thinks it is doing some good.    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 Pt had L TKA 08/05/2017. Last 3-4 months pt has been having pain L lateral leg. Pt also states feeling pain in her upper back  including her B shoulders. Mid back pain travels down her spine to her low back and feel radiating pain down her L lateral leg. R LE seems fine.   Denies loss of bowel or bladder control, or saddle anesthesia.  No falls within the last 6 months.  Low back and L lateral leg pain seems to have increased since onset.     Patient Stated Goals  Walk more comfortably. Stand longer than a minute at church.     Currently in Pain?  Yes    Pain Score  7     Pain Onset  More than a month ago         [facility] PT Assessment - 02/06/19 1312      Berg Balance Test   Sit to Stand  Able to stand without using hands and stabilize independently    Standing Unsupported  Able to stand safely 2 minutes    Sitting with Back Unsupported but Feet Supported on Floor or Stool  Able to sit safely and securely 2 minutes    Stand to Sit  Sits safely with minimal use of hands    Transfers  Able to transfer safely, minor use of hands    Standing Unsupported with Eyes Closed  Able to stand 10 seconds safely    Standing Unsupported with Feet Together  Able to place feet together independently and stand 1 minute safely    From Standing, Reach Forward with Outstretched Arm  Can reach forward >5 cm safely (2")    From Standing Position, Pick up Object from Floor  Able to pick up shoe safely and easily    From Standing Position, Turn to Look Behind Over each Shoulder  Looks behind from both sides and weight shifts well    Turn 360 Degrees  Able to turn 360 degrees safely but slowly    Standing Unsupported, Alternately Place Feet on Step/Stool  Able to complete >2 steps/needs minimal assist    Standing Unsupported, One Foot in Front  Able to take small step independently and hold 30 seconds    Standing on One Leg  Able to lift leg independently and hold equal to or more than 3 seconds   L LE 3 seconds, R LE 6 seconds   Total Score  45    Berg comment:  < 46/56 suggests increased need for AD and increased fall risk                           PT Education - 02/06/19 1311    Education provided  Yes    Education Details  ther-ex    [employer]) Educated  Patient    Methods  Explanation;Demonstration;Tactile cues;Verbal cues    Comprehension  Returned demonstration;Verbalized understanding      Objective   Medbridge Access Code: Access Code: [email or web address]   Pain face scale utilized to help pt with pain  scores  Pt states L knee has been a problems since she was 78 years old   Pt states that she thinks she can walk a little bit more in the house. Wants to continued  PT because she thinks it is doing some good.    Therapeutic exercise   Directed patient with sit <> stand throughout session Stand pivot transfer chair <> low mat table Static standing shoulder width apart, then with eyes closed, then with eyes open feet together, tandem stance with feet shoulder width apart Picking up an object (1lb dumbbell) from the floor,  Turning 360 degrees to the R and to the L 1x Looking behind to the R and to the L,  Standing forward reach,   Standing alternate toe taps 4 x each LE onto first regular step  Reviewed progress/current status with PT towards goals.   Reviewed plan of care: continued 2x/week for 6 weeks to promote strength and balance   Improved exercise technique, movement at target joints, use of target muscles after mod verbal, visual, tactile cues.     Pt response to treatment Rest breaks provided secondary to fatigueand to allow muscle recovery for next exercise. Pt tolerated session well without aggravation of symptoms.     Clinical impression Pt plateaus at about 7-8/10 leg pain, 6/10 L knee pain, and 6-7/10 back pain at most and TUG times but overall improved since initial measurement. Pt however demonstrates overall improvement in B LE strength and pt also improved her Berg Balance score by 12 points since initial measurement in July 2020 suggesting decreased fall risk (pt reports no falls recently). Pt making progress with overall balance and strength. Pt will benefit from continued skilled physical therapy services to decrease pain, improve strength, balance, and ability to perform tasks such as ambulation with less difficulty. Challenges to progress include chronicity of condition, sedentary lifestyle, age, and comorbidities.       PT Short Term  Goals - 08/08/18 1826      PT SHORT TERM GOAL #1   Title  Patient will be independent with her HEP to improve strength, decrease pain, improve function    Time  3    Period  Weeks    Status  On-going    Target Date  05/23/18        PT Long Term Goals - 02/06/19 1944      PT LONG TERM GOAL #1   Title  Patient will have a decrease in mid to low back pain to 5/10 or less at worst to promote ability to ambulate more comfortably and tolerate standing longer.     Baseline  8/10 back pain at worst for the past 2 months (possible greater than 8/10 at worst based on pt reports but pt seems to have difficulty with the pain scale; 04/30/2018); 5-6/10 mid to low back pain at most for the past month (08/08/2018); 7/10 back pain at worst for the past 7 days (09/24/2018); 6-7/10 back pain at most for the past 7 days (11/05/2018), (12/24/2018), (02/06/2019)    Time  6    Period  Weeks    Status  On-going    Target Date  03/20/19      PT LONG TERM GOAL #2   Title  Pt will have a decrease in L lateral leg pain to 5/10 or less at worst  to promote ability to ambulate more comfortably and tolerate standing longer.     Baseline  10/10 at worst for the past 2 months (04/30/2018); 7-8/10 L lateral leg pain/tingling at worst for the past month (08/08/2018). 7/10 tingling at most for the past 7 days (09/24/2018); 6-7/10 at most for the past 7 days (11/05/2018), (12/24/2018); 8/10 at  most for the past 7 days (02/06/2019)    Time  6    Period  Weeks    Status  On-going    Target Date  03/20/19      PT LONG TERM GOAL #3   Title  Patient will have a decrease in L knee joint pain to 5/10 or less at worst  to promote ability to ambulate more comfortably and tolerate standing longer.     Baseline  10/10 L knee pain at worst for the past 2 months (04/30/2018); 8/10 at worst for the past month (08/08/2018); 7-8/10 L knee pain at worst for the past 7 days (09/24/2018); 6-7/10 at worst for the past 7 days (11/05/2018), (12/24/2018); 6/10  at worst for the past 7 days (02/06/2019)    Time  6    Period  Weeks    Status  On-going    Target Date  03/20/19      PT LONG TERM GOAL #4   Title  Pt will improve B LE strength by at least 1/2 MMT grade  to promote ability to ambulate more comfortably and tolerate standing longer.     Time  6    Period  Weeks    Status  Partially Met    Target Date  03/20/19      PT LONG TERM GOAL #5   Title  Pt will report being able to tolerate standing to 5 minutes or more to promote ability to stand at church as well as improve ability to perform chores.     Baseline  Pt able to tolerate standing 1 min per subjective reports. (04/30/2018); half a minute per pt (08/08/2018); 2 minutes 8 seconds with light touch assist (09/24/2018); able to stand 2 min and 50 seconds prior to sitting down (11/05/2018). Able to stand at least 6 minutes (12/2018)    Time  6    Period  Weeks    Status  Achieved      PT LONG TERM GOAL #6   Title  Patient will improve her TUG time using SPC to 15 seconds or less as a demonstration of improved functional mobility and balance.    Baseline  26.3 seconds on average using SPC (09/30/2018); 17 seconds average with SPC (11/05/2018); 17.48 seconds without the SPC (12/24/2018); 17 seconds (02/04/2019)    Time  6    Period  Weeks    Status  Partially Met    Target Date  03/20/19      PT LONG TERM GOAL #7   Title  Patient will improve her Berg Balance Test score to 40/56 or more as a demonstration of improved balance and decreased fall risk.    Baseline  33/56 (09/30/2018); 43/56 (11/05/2018), (12/24/18); 45/56 (02/06/2019)    Time  6    Period  Weeks    Status  Achieved      PT LONG TERM GOAL #8   Title  Patient will improve her Berg Balance Test score to 47/56 or more as a demonstration of improved balance and decreased fall risk.    Baseline  45/56 (02/06/2019)    Time  6    Period  Weeks    Status  New    Target Date  03/20/19            Plan - 02/06/19 1259    Clinical  Impression Statement  Pt plateaus at about 7-8/10 leg pain, 6/10 L knee pain, and 6-7/10 back pain at most and TUG  times but overall improved since initial measurement. Pt however demonstrates overall improvement in B LE strength and pt also improved her Berg Balance score by 12 points since initial measurement in July 2020 suggesting decreased fall risk (pt reports no falls recently). Pt making progress with overall balance and strength. Pt will benefit from continued skilled physical therapy services to decrease pain, improve strength, balance, and ability to perform tasks such as ambulation with less difficulty. Challenges to progress include chronicity of condition, sedentary lifestyle, age, and comorbidities.    Personal Factors and Comorbidities  Age;Comorbidity 3+;Fitness;Past/Current Experience;Time since onset of injury/illness/exacerbation    Comorbidities  Memory deficit, rheumatoid arthritis, dyspnea, arthritis, B TKA, hx of hysterectomy, myelodysplastic syndrome, arthritis multiple areas    Examination-Activity Limitations  Locomotion Level;Carry;Stairs;Stand    Stability/Clinical Decision Making  Stable/Uncomplicated    Clinical Decision Making  Low    Clinical Presentation due to:  improved balance    Rehab Potential  Fair    Clinical Impairments Affecting Rehab Potential  sedentary lifestyle, age, medical history and chronicity of condition.    PT Frequency  2x / week    PT Duration  6 weeks    PT Treatment/Interventions  Aquatic Therapy;Cryotherapy;Moist Heat;Gait training;Stair training;Functional mobility training;Therapeutic activities;Therapeutic exercise;Patient/family education;Neuromuscular re-education;Balance training;Manual techniques;Taping;Energy conservation;Passive range of motion;Electrical Stimulation;Iontophoresis 75m/ml Dexamethasone;Dry needling    PT Next Visit Plan  Continue with core strengthening, hip strenghtening.    PT Home Exercise Plan  MIndio HillsAccess code:  [email or web address]   Consulted and Agree with Plan of Care  Patient;Family member/caregiver    Family Member Consulted  husband/caregiver       Patient will benefit from skilled therapeutic intervention in order to improve the following deficits and impairments:  Abnormal gait, Pain, Postural dysfunction, Decreased strength, Difficulty walking, Improper body mechanics, Decreased balance  Visit Diagnosis: Chronic pain of left knee - Plan: PT plan of care cert/re-cert  Unsteadiness on feet - Plan: PT plan of care cert/re-cert  Difficulty in walking, not elsewhere classified - Plan: PT plan of care cert/re-cert  Chronic bilateral low back pain, unspecified whether sciatica present - Plan: PT plan of care cert/re-cert  Radiculopathy, lumbosacral region - Plan: PT plan of care cert/re-cert  Muscle weakness (generalized) - Plan: PT plan of care cert/re-cert  Pain in thoracic spine - Plan: PT plan of care cert/re-cert     Problem List Patient Active Problem List   Diagnosis Date Noted  . Protein-calorie malnutrition ([place] 08/27/2017  . 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 Cubital canal compression syndrome, right 04/18/2017  . Primary osteoarthritis of knee 03/25/2017  . 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 Pure hypercholesterolemia 08/17/2016  . MDS (myelodysplastic syndrome) ([place] 05/12/2016  . B12 deficiency 05/04/2016  . Essential hypertension 05/04/2016  . Mixed stress and urge urinary incontinence 05/04/2016  . Rheumatoid arthritis involving multiple sites with positive rheumatoid factor ([place] 05/04/2016     Thank you for your referral.  MJoneen BoersPT, DPT   02/06/2019, 8:10 PM  [facility] [address] [place] [address]Phone: [phone]  Fax:  [phone] Name: Sylvia DieterMRN: [id number] of Birth: 315-Jan-1942

## 2019-02-10 ENCOUNTER — Ambulatory Visit: Payer: Medicare Other

## 2019-02-12 ENCOUNTER — Other Ambulatory Visit: Payer: Self-pay

## 2019-02-12 ENCOUNTER — Ambulatory Visit: Payer: Medicare Other

## 2019-02-12 DIAGNOSIS — M5417 Radiculopathy, lumbosacral region: Secondary | ICD-10-CM

## 2019-02-12 DIAGNOSIS — G8929 Other chronic pain: Secondary | ICD-10-CM

## 2019-02-12 DIAGNOSIS — R2681 Unsteadiness on feet: Secondary | ICD-10-CM

## 2019-02-12 DIAGNOSIS — R262 Difficulty in walking, not elsewhere classified: Secondary | ICD-10-CM

## 2019-02-12 DIAGNOSIS — M6281 Muscle weakness (generalized): Secondary | ICD-10-CM

## 2019-02-12 DIAGNOSIS — M546 Pain in thoracic spine: Secondary | ICD-10-CM

## 2019-02-12 DIAGNOSIS — M545 Low back pain: Secondary | ICD-10-CM

## 2019-02-12 DIAGNOSIS — M25562 Pain in left knee: Secondary | ICD-10-CM

## 2019-02-12 NOTE — Therapy (Signed)
[facility] [address], [place], [address] Phone: [phone]   Fax:  [phone]  Physical Therapy Treatment  Patient Details  Name: Sylvia Miller MRN: [id number] Date of Birth: 16-Mar-1940 Referring Provider (PT): Marlowe Sax, MD   Encounter Date: 02/12/2019  PT End of Session - 02/12/19 1653    Visit Number  51    Number of Visits  24    Date for PT Re-Evaluation  03/20/19    Authorization Type  1    Authorization Time Period  of 10 progress report    PT Start Time  1653    PT Stop Time  2035    PT Time Calculation (min)  41 min    Activity Tolerance  Other (comment)    Behavior During Therapy  [facility] for tasks assessed/performed       Past Medical History:  Diagnosis Date  . Allergy   . Anemia   . Arthritis    rheumatoid /knees/hands/shoulders (Right shoulder) infusion every 5 weeks  . Bladder incontinence   . Chicken pox   . Dyspnea    with exertion or far walking  . Gastric ulcer   . GERD (gastroesophageal reflux disease)   . Heart murmur   . Hypercholesteremia   . Hyperlipidemia, unspecified   . Hypertension    controlled with meds;   Marland Kitchen MDS (myelodysplastic syndrome) ([place]) 2000  . Memory deficit   . Muscular dystrophy ([place])    per patient, this is incorrect  . Myelodysplastic syndrome ([place])   . Myelodysplastic syndrome ([place])   . Rheumatoid arthritis ([place])   . Ulcer     Past Surgical History:  Procedure Laterality Date  . ABDOMINAL HYSTERECTOMY    . APPENDECTOMY     as a child  . CHOLECYSTECTOMY    . COLONOSCOPY    . COLONOSCOPY WITH PROPOFOL N/A 04/24/2017   Procedure: COLONOSCOPY WITH PROPOFOL;  Surgeon: Lollie Sails, MD;  Location: [facility] ENDOSCOPY;  Service: Endoscopy;  Laterality: N/A;  . ESOPHAGOGASTRODUODENOSCOPY (EGD) WITH PROPOFOL N/A 04/24/2017   Procedure: ESOPHAGOGASTRODUODENOSCOPY (EGD) WITH PROPOFOL;  Surgeon: Lollie Sails, MD;  Location: [facility]  ENDOSCOPY;  Service: Endoscopy;  Laterality: N/A;  . JOINT REPLACEMENT    . KNEE ARTHROPLASTY Right 02/21/2017   Procedure: COMPUTER ASSISTED TOTAL KNEE ARTHROPLASTY;  Surgeon: Dereck Leep, MD;  Location: [facility] ORS;  Service: Orthopedics;  Laterality: Right;  . KNEE ARTHROPLASTY Left 08/06/2017   Procedure: COMPUTER ASSISTED TOTAL KNEE ARTHROPLASTY;  Surgeon: Dereck Leep, MD;  Location: [facility] ORS;  Service: Orthopedics;  Laterality: Left;  . NM INTRAVENOUS INJECTION ([facility] HX)     receives biologic infusions every 5 weeks for rheumatoid arthritis. followed by dr. Meda Coffee at [place] [facility]  . TONSILLECTOMY    . UPPER GASTROINTESTINAL ENDOSCOPY    . WISDOM TOOTH EXTRACTION      There were no vitals filed for this visit.  Subjective Assessment - 02/12/19 1655    Subjective  Doing ok. The L knee is a little bit sore. Does not like the cold weather. 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 Pt had L TKA 08/05/2017. Last 3-4 months pt has been having pain L lateral leg. Pt also states feeling pain in her upper back  including her B shoulders. Mid back pain travels down her spine to her  low back and feel radiating pain down her L lateral leg. R LE seems fine.   Denies loss of bowel or bladder control, or saddle anesthesia.  No falls within the last 6 months.  Low back and L lateral leg pain seems to have increased since onset.     Patient Stated Goals  Walk more comfortably. Stand longer than a minute at church.    Currently in Pain?  Yes   no pain level provided.   Pain Onset  More than a month ago                               PT Education - 02/12/19 1721    Education provided  Yes    Education Details  ther-ex    [employer]) Educated  Patient    Methods  Explanation;Demonstration;Tactile cues;Verbal cues    Comprehension  Returned demonstration;Verbalized  understanding      Objective   Medbridge Access Code: Access Code: [email or web address]   Pain face scale utilized to help pt with pain scores  Patient arrived late to appointment, modified accordingly   Pt states L knee has been a problems since she was 78 years old   Manual therapy  Seated STM L IT band, vastus lateralis,medial thigh to promote mobility     Therapeutic exercise   Seated trunk flexion stretch 10x5 seconds   No change in LE pain  Static standing  Decreased B LE symptoms at L5 dermatome but increases knee joint pain  Standing hip abduction with B UE assist to promote glute med muscle strengthening.  L 10x2 R 10x2  Standing alternating toe taps onto treadmill platform with light touch assist to promote balance  10x2 each LE   SLS with B UE assist to promote balance  L  LE 5 seconds, Pt assist to maintain neutral pelvis. R hamstring cramp  Seated trunk flexion position, resting forearms on thighs. Decreased cramp  Seated hip adduction ball and glute max squeeze 10x10 seconds   Forward step ups ontoAir Ex Padwith L LE and R UE assist(upgrade: decreased level of UE assist) 2x10L LE  Lateral step up onto Air Ex pad with L LE and B UE assist to promote LE strength and stability  L 10x   Try tandem standing next visit if appropriate   Improved exercise technique, movement at target joints, use of target muscles after mod verbal, visual, tactile cues.      Pt response to treatment Rest breaks provided secondary to fatigueand to allow muscle recovery for next exercise. Pt tolerated session well without aggravation of symptoms.     Clinical impression Continued on working on L LE strength to promote ability to support herself during gait and standing activities to promote balance. Continued soft tissue techniques to decrease soft tissue limitation around her L knee to promote  proper mechanics when performing closed chain tasks. Pt tolerated session well without aggravation of symptoms. Pt will benefit from continued skilled physical therapy services to improve balance, decrease pain, improve function.     PT Short Term Goals - 08/08/18 1826      PT SHORT TERM GOAL #1   Title  Patient will be independent with her HEP to improve strength, decrease pain, improve function    Time  3    Period  Weeks    Status  On-going    Target Date  05/23/18  PT Long Term Goals - 02/06/19 1944      PT LONG TERM GOAL #1   Title  Patient will have a decrease in mid to low back pain to 5/10 or less at worst to promote ability to ambulate more comfortably and tolerate standing longer.     Baseline  8/10 back pain at worst for the past 2 months (possible greater than 8/10 at worst based on pt reports but pt seems to have difficulty with the pain scale; 04/30/2018); 5-6/10 mid to low back pain at most for the past month (08/08/2018); 7/10 back pain at worst for the past 7 days (09/24/2018); 6-7/10 back pain at most for the past 7 days (11/05/2018), (12/24/2018), (02/06/2019)    Time  6    Period  Weeks    Status  On-going    Target Date  03/20/19      PT LONG TERM GOAL #2   Title  Pt will have a decrease in L lateral leg pain to 5/10 or less at worst  to promote ability to ambulate more comfortably and tolerate standing longer.     Baseline  10/10 at worst for the past 2 months (04/30/2018); 7-8/10 L lateral leg pain/tingling at worst for the past month (08/08/2018). 7/10 tingling at most for the past 7 days (09/24/2018); 6-7/10 at most for the past 7 days (11/05/2018), (12/24/2018); 8/10 at most for the past 7 days (02/06/2019)    Time  6    Period  Weeks    Status  On-going    Target Date  03/20/19      PT LONG TERM GOAL #3   Title  Patient will have a decrease in L knee joint pain to 5/10 or less at worst  to promote ability to ambulate more comfortably and tolerate standing longer.      Baseline  10/10 L knee pain at worst for the past 2 months (04/30/2018); 8/10 at worst for the past month (08/08/2018); 7-8/10 L knee pain at worst for the past 7 days (09/24/2018); 6-7/10 at worst for the past 7 days (11/05/2018), (12/24/2018); 6/10 at worst for the past 7 days (02/06/2019)    Time  6    Period  Weeks    Status  On-going    Target Date  03/20/19      PT LONG TERM GOAL #4   Title  Pt will improve B LE strength by at least 1/2 MMT grade  to promote ability to ambulate more comfortably and tolerate standing longer.     Time  6    Period  Weeks    Status  Partially Met    Target Date  03/20/19      PT LONG TERM GOAL #5   Title  Pt will report being able to tolerate standing to 5 minutes or more to promote ability to stand at church as well as improve ability to perform chores.     Baseline  Pt able to tolerate standing 1 min per subjective reports. (04/30/2018); half a minute per pt (08/08/2018); 2 minutes 8 seconds with light touch assist (09/24/2018); able to stand 2 min and 50 seconds prior to sitting down (11/05/2018). Able to stand at least 6 minutes (12/2018)    Time  6    Period  Weeks    Status  Achieved      PT LONG TERM GOAL #6   Title  Patient will improve her TUG time using SPC to 15 seconds or  less as a demonstration of improved functional mobility and balance.    Baseline  26.3 seconds on average using SPC (09/30/2018); 17 seconds average with SPC (11/05/2018); 17.48 seconds without the SPC (12/24/2018); 17 seconds (02/04/2019)    Time  6    Period  Weeks    Status  Partially Met    Target Date  03/20/19      PT LONG TERM GOAL #7   Title  Patient will improve her Berg Balance Test score to 40/56 or more as a demonstration of improved balance and decreased fall risk.    Baseline  33/56 (09/30/2018); 43/56 (11/05/2018), (12/24/18); 45/56 (02/06/2019)    Time  6    Period  Weeks    Status  Achieved      PT LONG TERM GOAL #8   Title  Patient will improve her Berg Balance  Test score to 47/56 or more as a demonstration of improved balance and decreased fall risk.    Baseline  45/56 (02/06/2019)    Time  6    Period  Weeks    Status  New    Target Date  03/20/19            Plan - 02/12/19 1734    Clinical Impression Statement  Continued on working on L LE strength to promote ability to support herself during gait and standing activities to promote balance. Continued soft tissue techniques to decrease soft tissue limitation around her L knee to promote proper mechanics when performing closed chain tasks. Pt tolerated session well without aggravation of symptoms. Pt will benefit from continued skilled physical therapy services to improve balance, decrease pain, improve function.    Personal Factors and Comorbidities  Age;Comorbidity 3+;Fitness;Past/Current Experience;Time since onset of injury/illness/exacerbation    Comorbidities  Memory deficit, rheumatoid arthritis, dyspnea, arthritis, B TKA, hx of hysterectomy, myelodysplastic syndrome, arthritis multiple areas    Examination-Activity Limitations  Locomotion Level;Carry;Stairs;Stand    Stability/Clinical Decision Making  Stable/Uncomplicated    Rehab Potential  Fair    Clinical Impairments Affecting Rehab Potential  sedentary lifestyle, age, medical history and chronicity of condition.    PT Frequency  2x / week    PT Duration  6 weeks    PT Treatment/Interventions  Aquatic Therapy;Cryotherapy;Moist Heat;Gait training;Stair training;Functional mobility training;Therapeutic activities;Therapeutic exercise;Patient/family education;Neuromuscular re-education;Balance training;Manual techniques;Taping;Energy conservation;Passive range of motion;Electrical Stimulation;Iontophoresis 30m/ml Dexamethasone;Dry needling    PT Next Visit Plan  Continue with core strengthening, hip strenghtening.    PT Home Exercise Plan  [email or web address] code: [email or web address]   Consulted and Agree with Plan of Care  Patient;Family  member/caregiver    Family Member Consulted  husband/caregiver       Patient will benefit from skilled therapeutic intervention in order to improve the following deficits and impairments:  Abnormal gait, Pain, Postural dysfunction, Decreased strength, Difficulty walking, Improper body mechanics, Decreased balance  Visit Diagnosis: Chronic pain of left knee  Unsteadiness on feet  Difficulty in walking, not elsewhere classified  Chronic bilateral low back pain, unspecified whether sciatica present  Radiculopathy, lumbosacral region  Muscle weakness (generalized)  Pain in thoracic spine     Problem List Patient Active Problem List   Diagnosis Date Noted  . Protein-calorie malnutrition ([place] 08/27/2017  . 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 Cubital canal compression syndrome, right 04/18/2017  . Primary osteoarthritis of knee 03/25/2017  . 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 Pure hypercholesterolemia 08/17/2016  . MDS (myelodysplastic syndrome) ([place] 05/12/2016  .  B12 deficiency 05/04/2016  . Essential hypertension 05/04/2016  . Mixed stress and urge urinary incontinence 05/04/2016  . Rheumatoid arthritis involving multiple sites with positive rheumatoid factor ([place]) 05/04/2016   Joneen Boers PT, DPT   02/12/2019, 5:49 PM  [facility] [address], [place], [address] Phone: [phone]   Fax:  [phone]  Name: Telicia Hodgkiss MRN: [id number] Date of Birth: 27-Feb-1941

## 2019-02-17 ENCOUNTER — Other Ambulatory Visit: Payer: Self-pay

## 2019-02-17 ENCOUNTER — Ambulatory Visit: Payer: Medicare Other

## 2019-02-17 DIAGNOSIS — M25562 Pain in left knee: Secondary | ICD-10-CM | POA: Diagnosis not present

## 2019-02-17 DIAGNOSIS — M5417 Radiculopathy, lumbosacral region: Secondary | ICD-10-CM

## 2019-02-17 DIAGNOSIS — M6281 Muscle weakness (generalized): Secondary | ICD-10-CM

## 2019-02-17 DIAGNOSIS — R262 Difficulty in walking, not elsewhere classified: Secondary | ICD-10-CM

## 2019-02-17 DIAGNOSIS — G8929 Other chronic pain: Secondary | ICD-10-CM

## 2019-02-17 DIAGNOSIS — R2681 Unsteadiness on feet: Secondary | ICD-10-CM

## 2019-02-17 NOTE — Therapy (Signed)
[facility] [address], [place], [address] Phone: [phone]   Fax:  [phone]  Physical Therapy Treatment  Patient Details  Name: Sylvia Miller MRN: [id number] Date of Birth: [birth year] Referring Provider (PT): Marlowe Sax, MD   Encounter Date: 02/17/2019  PT End of Session - 02/17/19 1533    Visit Number  52    Number of Visits  90    Date for PT Re-Evaluation  03/20/19    Authorization Type  2    Authorization Time Period  of 10 progress report    PT Start Time  1532    PT Stop Time  4967    PT Time Calculation (min)  42 min    Activity Tolerance  Other (comment)    Behavior During Therapy  WFL for tasks assessed/performed       Past Medical History:  Diagnosis Date  . Allergy   . Anemia   . Arthritis    rheumatoid /knees/hands/shoulders (Right shoulder) infusion every 5 weeks  . Bladder incontinence   . Chicken pox   . Dyspnea    with exertion or far walking  . Gastric ulcer   . GERD (gastroesophageal reflux disease)   . Heart murmur   . Hypercholesteremia   . Hyperlipidemia, unspecified   . Hypertension    controlled with meds;   Marland Kitchen MDS (myelodysplastic syndrome) ([place]) 2000  . Memory deficit   . Muscular dystrophy ([place])    per patient, this is incorrect  . Myelodysplastic syndrome ([place])   . Myelodysplastic syndrome ([place])   . Rheumatoid arthritis ([place])   . Ulcer     Past Surgical History:  Procedure Laterality Date  . ABDOMINAL HYSTERECTOMY    . APPENDECTOMY     as a child  . CHOLECYSTECTOMY    . COLONOSCOPY    . COLONOSCOPY WITH PROPOFOL N/A 04/24/2017   Procedure: COLONOSCOPY WITH PROPOFOL;  Surgeon: Lollie Sails, MD;  Location[facility] ENDOSCOPY;  Service: Endoscopy;  Laterality: N/A;  . ESOPHAGOGASTRODUODENOSCOPY (EGD) WITH PROPOFOL N/A 04/24/2017   Procedure: ESOPHAGOGASTRODUODENOSCOPY (EGD) WITH PROPOFOL;  Surgeon: Lollie Sails, MD;  Location: [facility]  ENDOSCOPY;  Service: Endoscopy;  Laterality: N/A;  . JOINT REPLACEMENT    . KNEE ARTHROPLASTY Right 02/21/2017   Procedure: COMPUTER ASSISTED TOTAL KNEE ARTHROPLASTY;  Surgeon: Dereck Leep, MD;  Location: [facility] ORS;  Service: Orthopedics;  Laterality: Right;  . KNEE ARTHROPLASTY Left 08/06/2017   Procedure: COMPUTER ASSISTED TOTAL KNEE ARTHROPLASTY;  Surgeon: Dereck Leep, MD;  Location: [facility] ORS;  Service: Orthopedics;  Laterality: Left;  . NM INTRAVENOUS INJECTION ([facility] HX)     receives biologic infusions every 5 weeks for rheumatoid arthritis. followed by dr. Meda Coffee at [facility]  . TONSILLECTOMY    . UPPER GASTROINTESTINAL ENDOSCOPY    . WISDOM TOOTH EXTRACTION      There were no vitals filed for this visit.  Subjective Assessment - 02/17/19 1534    Subjective  Knees are kind of achy. They do not like the cold weather. Back seems to be ok.    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 Pt had L TKA 08/05/2017. Last 3-4 months pt has been having pain L lateral leg. Pt also states feeling pain in her upper back  including her B shoulders. Mid back pain travels down her spine to her low back and feel radiating pain down her L lateral leg. R LE  seems fine.   Denies loss of bowel or bladder control, or saddle anesthesia.  No falls within the last 6 months.  Low back and L lateral leg pain seems to have increased since onset.     Patient Stated Goals  Walk more comfortably. Stand longer than a minute at church.    Currently in Pain?  Yes    Pain Score  5    B knee pain   Pain Onset  More than a month ago                               PT Education - 02/17/19 1540    Education provided  Yes    Education Details  ther-ex    [employer]) Educated  Patient    Methods  Explanation;Demonstration;Tactile cues;Verbal cues    Comprehension  Returned demonstration;Verbalized understanding      Objective   Medbridge Access Code: Access Code:  [email or web address]   Pain face scale utilized to help pt with pain scores  Patient arrived late to appointment, modified accordingly   Pt states L knee has been a problems since she was 78 years old   Manual therapy  Seated STM L IT band, vastus lateralis,medial thigh to promote mobility     Therapeutic exercise     Forward step ups ontoAir Ex Padwith L LE and R UE assistto promote LE strength and stability   2x10L LE  Lateral step up onto Air Ex pad with L LE and B UE assist to promote LE strength and stability  L 10x2  Side stepping on Air Ex pad with B UE assist 2x, then 3x to the L and 2x, then 3x  to the R to promote glute med strengthening, stability and balance   Standing with one foot forward, (not directly in front of other foot, one foot length between feet) one UE assist to get into position  R foot forward 30 seconds x2 no UE assist  L foot forward 30 seconds x2 no UE assist    Cues to maintain center of gravity between feet.     Standing alternating toe taps onto treadmill platform with light touch assist to no UE assist to promote balance CGA             10x each LE   Able to perform about 50% of exercise without UE assist  Standing hip abduction with B UE assist to promote glute med muscle strengthening.  L 10x2 R 10x2       Improved exercise technique, movement at target joints, use of target muscles after mod verbal, visual, tactile cues.      Pt response to treatment Rest breaks provided secondary to fatigueand to allow muscle recovery for next exercise.Pt tolerated session well without aggravation of symptoms.   Clinical impression Continued working on glute med strength to promote single leg balance, general LE strengthening on uneven surface to promote strength and stability at knee joint, and balance with decreased base of support to help decrease fall  risk. Cues needed to place and maintain her center of gravity over her base of support. Able to perform standing alternating toe taps onto treadmill platform today 5x each LE without UE assist (the other 5 x needed light touch assist secondary to fatigue). Pt tolerated session well without aggravation of symptoms. Pt will benefit from continued skilled physical therapy services to decrease pain, improve strength, balance and function.  PT Short Term Goals - 08/08/18 1826      PT SHORT TERM GOAL #1   Title  Patient will be independent with her HEP to improve strength, decrease pain, improve function    Time  3    Period  Weeks    Status  On-going    Target Date  05/23/18        PT Long Term Goals - 02/06/19 1944      PT LONG TERM GOAL #1   Title  Patient will have a decrease in mid to low back pain to 5/10 or less at worst to promote ability to ambulate more comfortably and tolerate standing longer.     Baseline  8/10 back pain at worst for the past 2 months (possible greater than 8/10 at worst based on pt reports but pt seems to have difficulty with the pain scale; 04/30/2018); 5-6/10 mid to low back pain at most for the past month (08/08/2018); 7/10 back pain at worst for the past 7 days (09/24/2018); 6-7/10 back pain at most for the past 7 days (11/05/2018), (12/24/2018), (02/06/2019)    Time  6    Period  Weeks    Status  On-going    Target Date  03/20/19      PT LONG TERM GOAL #2   Title  Pt will have a decrease in L lateral leg pain to 5/10 or less at worst  to promote ability to ambulate more comfortably and tolerate standing longer.     Baseline  10/10 at worst for the past 2 months (04/30/2018); 7-8/10 L lateral leg pain/tingling at worst for the past month (08/08/2018). 7/10 tingling at most for the past 7 days (09/24/2018); 6-7/10 at most for the past 7 days (11/05/2018), (12/24/2018); 8/10 at most for the past 7 days (02/06/2019)    Time  6    Period  Weeks    Status  On-going     Target Date  03/20/19      PT LONG TERM GOAL #3   Title  Patient will have a decrease in L knee joint pain to 5/10 or less at worst  to promote ability to ambulate more comfortably and tolerate standing longer.     Baseline  10/10 L knee pain at worst for the past 2 months (04/30/2018); 8/10 at worst for the past month (08/08/2018); 7-8/10 L knee pain at worst for the past 7 days (09/24/2018); 6-7/10 at worst for the past 7 days (11/05/2018), (12/24/2018); 6/10 at worst for the past 7 days (02/06/2019)    Time  6    Period  Weeks    Status  On-going    Target Date  03/20/19      PT LONG TERM GOAL #4   Title  Pt will improve B LE strength by at least 1/2 MMT grade  to promote ability to ambulate more comfortably and tolerate standing longer.     Time  6    Period  Weeks    Status  Partially Met    Target Date  03/20/19      PT LONG TERM GOAL #5   Title  Pt will report being able to tolerate standing to 5 minutes or more to promote ability to stand at church as well as improve ability to perform chores.     Baseline  Pt able to tolerate standing 1 min per subjective reports. (04/30/2018); half a minute per pt (08/08/2018); 2 minutes 8 seconds with light touch  assist (09/24/2018); able to stand 2 min and 50 seconds prior to sitting down (11/05/2018). Able to stand at least 6 minutes (12/2018)    Time  6    Period  Weeks    Status  Achieved      PT LONG TERM GOAL #6   Title  Patient will improve her TUG time using SPC to 15 seconds or less as a demonstration of improved functional mobility and balance.    Baseline  26.3 seconds on average using SPC (09/30/2018); 17 seconds average with SPC (11/05/2018); 17.48 seconds without the SPC (12/24/2018); 17 seconds (02/04/2019)    Time  6    Period  Weeks    Status  Partially Met    Target Date  03/20/19      PT LONG TERM GOAL #7   Title  Patient will improve her Berg Balance Test score to 40/56 or more as a demonstration of improved balance and decreased fall  risk.    Baseline  33/56 (09/30/2018); 43/56 (11/05/2018), (12/24/18); 45/56 (02/06/2019)    Time  6    Period  Weeks    Status  Achieved      PT LONG TERM GOAL #8   Title  Patient will improve her Berg Balance Test score to 47/56 or more as a demonstration of improved balance and decreased fall risk.    Baseline  45/56 (02/06/2019)    Time  6    Period  Weeks    Status  New    Target Date  03/20/19            Plan - 02/17/19 1540    Clinical Impression Statement  Continued working on glute med strength to promote single leg balance, general LE strengthening on uneven surface to promote strength and stability at knee joint, and balance with decreased base of support to help decrease fall risk. Cues needed to place and maintain her center of gravity over her base of support. Able to perform standing alternating toe taps onto treadmill platform today 5x each LE without UE assist (the other 5 x needed light touch assist secondary to fatigue). Pt tolerated session well without aggravation of symptoms. Pt will benefit from continued skilled physical therapy services to decrease pain, improve strength, balance and function.    Personal Factors and Comorbidities  Age;Comorbidity 3+;Fitness;Past/Current Experience;Time since onset of injury/illness/exacerbation    Comorbidities  Memory deficit, rheumatoid arthritis, dyspnea, arthritis, B TKA, hx of hysterectomy, myelodysplastic syndrome, arthritis multiple areas    Examination-Activity Limitations  Locomotion Level;Carry;Stairs;Stand    Stability/Clinical Decision Making  Stable/Uncomplicated    Rehab Potential  Fair    Clinical Impairments Affecting Rehab Potential  sedentary lifestyle, age, medical history and chronicity of condition.    PT Frequency  2x / week    PT Duration  6 weeks    PT Treatment/Interventions  Aquatic Therapy;Cryotherapy;Moist Heat;Gait training;Stair training;Functional mobility training;Therapeutic activities;Therapeutic  exercise;Patient/family education;Neuromuscular re-education;Balance training;Manual techniques;Taping;Energy conservation;Passive range of motion;Electrical Stimulation;Iontophoresis 11m/ml Dexamethasone;Dry needling    PT Next Visit Plan  Continue with core strengthening, hip strenghtening.    PT Home Exercise Plan  [email or web address]: [email or web address]   Consulted and Agree with Plan of Care  Patient;Family member/caregiver    Family Member Consulted  husband/caregiver       Patient will benefit from skilled therapeutic intervention in order to improve the following deficits and impairments:  Abnormal gait, Pain, Postural dysfunction, Decreased strength, Difficulty walking, Improper body mechanics, Decreased balance  Visit  Diagnosis: Chronic pain of left knee  Unsteadiness on feet  Difficulty in walking, not elsewhere classified  Chronic bilateral low back pain, unspecified whether sciatica present  Radiculopathy, lumbosacral region  Muscle weakness (generalized)     Problem List Patient Active Problem List   Diagnosis Date Noted  . Protein-calorie malnutrition ([place]) 08/27/2017  . 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 Cubital canal compression syndrome, right 04/18/2017  . Primary osteoarthritis of knee 03/25/2017  . 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 Pure hypercholesterolemia 08/17/2016  . MDS (myelodysplastic syndrome) ([place]) 05/12/2016  . B12 deficiency 05/04/2016  . Essential hypertension 05/04/2016  . Mixed stress and urge urinary incontinence 05/04/2016  . Rheumatoid arthritis involving multiple sites with positive rheumatoid factor ([place]) 05/04/2016    Joneen Boers PT, DPT   02/17/2019, 5:35 PM  [facility] [address], [place], [address] Phone: [phone]   Fax:  [phone]  Name: Sylvia Miller MRN: [id number] Date of Birth: 27-Sep-1940

## 2019-02-25 ENCOUNTER — Other Ambulatory Visit: Payer: Self-pay

## 2019-02-25 ENCOUNTER — Ambulatory Visit: Payer: Medicare Other

## 2019-02-25 DIAGNOSIS — R2681 Unsteadiness on feet: Secondary | ICD-10-CM

## 2019-02-25 DIAGNOSIS — M6281 Muscle weakness (generalized): Secondary | ICD-10-CM

## 2019-02-25 DIAGNOSIS — M25562 Pain in left knee: Secondary | ICD-10-CM

## 2019-02-25 DIAGNOSIS — G8929 Other chronic pain: Secondary | ICD-10-CM

## 2019-02-25 DIAGNOSIS — M5417 Radiculopathy, lumbosacral region: Secondary | ICD-10-CM

## 2019-02-25 DIAGNOSIS — M546 Pain in thoracic spine: Secondary | ICD-10-CM

## 2019-02-25 DIAGNOSIS — R262 Difficulty in walking, not elsewhere classified: Secondary | ICD-10-CM

## 2019-02-25 DIAGNOSIS — M545 Low back pain, unspecified: Secondary | ICD-10-CM

## 2019-02-25 NOTE — Therapy (Signed)
[facility] [address], [place], [address] Phone: [phone]   Fax:  [phone]  Physical Therapy Treatment  Patient Details  Name: Sylvia Miller MRN: [id number] Date of Birth: [birth year] Referring Provider (PT): Marlowe Sax, MD   Encounter Date: 02/25/2019  PT End of Session - 02/25/19 1307    Visit Number  59    Number of Visits  20    Date for PT Re-Evaluation  03/20/19    Authorization Type  3    Authorization Time Period  of 10 progress report    PT Start Time  1310   pt arrived late   PT Stop Time  1341    PT Time Calculation (min)  31 min    Activity Tolerance  Other (comment)    Behavior During Therapy  [facility] for tasks assessed/performed       Past Medical History:  Diagnosis Date  . Allergy   . Anemia   . Arthritis    rheumatoid /knees/hands/shoulders (Right shoulder) infusion every 5 weeks  . Bladder incontinence   . Chicken pox   . Dyspnea    with exertion or far walking  . Gastric ulcer   . GERD (gastroesophageal reflux disease)   . Heart murmur   . Hypercholesteremia   . Hyperlipidemia, unspecified   . Hypertension    controlled with meds;   Marland Kitchen MDS (myelodysplastic syndrome) ([place]) 2000  . Memory deficit   . Muscular dystrophy ([place])    per patient, this is incorrect  . Myelodysplastic syndrome ([place])   . Myelodysplastic syndrome ([place])   . Rheumatoid arthritis ([place])   . Ulcer     Past Surgical History:  Procedure Laterality Date  . ABDOMINAL HYSTERECTOMY    . APPENDECTOMY     as a child  . CHOLECYSTECTOMY    . COLONOSCOPY    . COLONOSCOPY WITH PROPOFOL N/A 04/24/2017   Procedure: COLONOSCOPY WITH PROPOFOL;  Surgeon: Lollie Sails, MD;  Location: [facility] ENDOSCOPY;  Service: Endoscopy;  Laterality: N/A;  . ESOPHAGOGASTRODUODENOSCOPY (EGD) WITH PROPOFOL N/A 04/24/2017   Procedure: ESOPHAGOGASTRODUODENOSCOPY (EGD) WITH PROPOFOL;  Surgeon: Lollie Sails,  MD;  Location: [facility] ENDOSCOPY;  Service: Endoscopy;  Laterality: N/A;  . JOINT REPLACEMENT    . KNEE ARTHROPLASTY Right 02/21/2017   Procedure: COMPUTER ASSISTED TOTAL KNEE ARTHROPLASTY;  Surgeon: Dereck Leep, MD;  Location: [facility] ORS;  Service: Orthopedics;  Laterality: Right;  . KNEE ARTHROPLASTY Left 08/06/2017   Procedure: COMPUTER ASSISTED TOTAL KNEE ARTHROPLASTY;  Surgeon: Dereck Leep, MD;  Location: [facility] ORS;  Service: Orthopedics;  Laterality: Left;  . NM INTRAVENOUS INJECTION ([facility] HX)     receives biologic infusions every 5 weeks for rheumatoid arthritis. followed by dr. Meda Coffee at [facility]  . TONSILLECTOMY    . UPPER GASTROINTESTINAL ENDOSCOPY    . WISDOM TOOTH EXTRACTION      There were no vitals filed for this visit.  Subjective Assessment - 02/25/19 1312    Subjective  Back is pretty good. The L knee still bothers her.    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 Pt had L TKA 08/05/2017. Last 3-4 months pt has been having pain L lateral leg. Pt also states feeling pain in her upper back  including her B shoulders. Mid back pain travels down her spine to her low back and feel radiating pain down her L lateral leg. R LE seems fine.  Denies loss of bowel or bladder control, or saddle anesthesia.  No falls within the last 6 months.  Low back and L lateral leg pain seems to have increased since onset.     Patient Stated Goals  Walk more comfortably. Stand longer than a minute at church.    Currently in Pain?  Yes    Pain Score  6    6.5/10 L knee when walking   Pain Onset  More than a month ago                               PT Education - 02/25/19 1316    Education provided  Yes    Education Details  ther-ex    [employer]) Educated  Patient    Methods  Explanation;Demonstration;Tactile cues;Verbal cues    Comprehension  Returned demonstration;Verbalized understanding       Objective   Medbridge Access Code:  Access Code: [email or web address]   Pain face scale utilized to help pt with pain scores  Patient arrived late to appointment, modified accordingly   Pt states L knee has been a problems since she was 78 years old   Manual therapy  Seated STM L IT band, vastus lateralis,medial thighto promote mobility     Therapeutic exercise   SLS with B UE assist  L LE 10x5 seconds   R LE 10x5 seconds    Side stepping on Air Ex pad with B UE assist 5x each side  Sit <> stand from regular chair with arms 5x without UE assist .  Forward step ups ontoAir Ex Padwith L LE andRUE assistto promote LE strength and stability  2x10L LE   Standing with one foot forward, (not directly in front of other foot, one foot length between feet) one UE assist to get into position             R foot forward 30 seconds x2 no UE assist             L foot forward 30 seconds x2 no UE assist                          Cues for proper technique      Improved exercise technique, movement at target joints, use of target muscles after mod verbal, visual, tactile cues.      Pt response to treatment Rest breaks provided secondary to fatigueand to allow muscle recovery for next exercise.Pt tolerated session well without aggravation of symptoms.   Clinical impression  Pt arrived late so session adjusted accordingly. Continued working on LE strengthening to promote ability to support herself during standing tasks as well as balance to help decrease fall risk. Pt tolerated session well without aggravation of symptoms. Better able to place her foot further forward during tandem stance today. PT will benefit from continued skilled physical therapy services to improve balance, strength and function.    PT Short Term Goals - 08/08/18 1826      PT SHORT TERM GOAL #1   Title  Patient will be independent with her HEP to improve strength, decrease pain, improve function     Time  3    Period  Weeks    Status  On-going    Target Date  05/23/18        PT Long Term Goals - 02/06/19 1944      PT LONG TERM GOAL #  1   Title  Patient will have a decrease in mid to low back pain to 5/10 or less at worst to promote ability to ambulate more comfortably and tolerate standing longer.     Baseline  8/10 back pain at worst for the past 2 months (possible greater than 8/10 at worst based on pt reports but pt seems to have difficulty with the pain scale; 04/30/2018); 5-6/10 mid to low back pain at most for the past month (08/08/2018); 7/10 back pain at worst for the past 7 days (09/24/2018); 6-7/10 back pain at most for the past 7 days (11/05/2018), (12/24/2018), (02/06/2019)    Time  6    Period  Weeks    Status  On-going    Target Date  03/20/19      PT LONG TERM GOAL #2   Title  Pt will have a decrease in L lateral leg pain to 5/10 or less at worst  to promote ability to ambulate more comfortably and tolerate standing longer.     Baseline  10/10 at worst for the past 2 months (04/30/2018); 7-8/10 L lateral leg pain/tingling at worst for the past month (08/08/2018). 7/10 tingling at most for the past 7 days (09/24/2018); 6-7/10 at most for the past 7 days (11/05/2018), (12/24/2018); 8/10 at most for the past 7 days (02/06/2019)    Time  6    Period  Weeks    Status  On-going    Target Date  03/20/19      PT LONG TERM GOAL #3   Title  Patient will have a decrease in L knee joint pain to 5/10 or less at worst  to promote ability to ambulate more comfortably and tolerate standing longer.     Baseline  10/10 L knee pain at worst for the past 2 months (04/30/2018); 8/10 at worst for the past month (08/08/2018); 7-8/10 L knee pain at worst for the past 7 days (09/24/2018); 6-7/10 at worst for the past 7 days (11/05/2018), (12/24/2018); 6/10 at worst for the past 7 days (02/06/2019)    Time  6    Period  Weeks    Status  On-going    Target Date  03/20/19      PT LONG TERM GOAL #4   Title  Pt  will improve B LE strength by at least 1/2 MMT grade  to promote ability to ambulate more comfortably and tolerate standing longer.     Time  6    Period  Weeks    Status  Partially Met    Target Date  03/20/19      PT LONG TERM GOAL #5   Title  Pt will report being able to tolerate standing to 5 minutes or more to promote ability to stand at church as well as improve ability to perform chores.     Baseline  Pt able to tolerate standing 1 min per subjective reports. (04/30/2018); half a minute per pt (08/08/2018); 2 minutes 8 seconds with light touch assist (09/24/2018); able to stand 2 min and 50 seconds prior to sitting down (11/05/2018). Able to stand at least 6 minutes (12/2018)    Time  6    Period  Weeks    Status  Achieved      PT LONG TERM GOAL #6   Title  Patient will improve her TUG time using SPC to 15 seconds or less as a demonstration of improved functional mobility and balance.    Baseline  26.3  seconds on average using SPC (09/30/2018); 17 seconds average with SPC (11/05/2018); 17.48 seconds without the SPC (12/24/2018); 17 seconds (02/04/2019)    Time  6    Period  Weeks    Status  Partially Met    Target Date  03/20/19      PT LONG TERM GOAL #7   Title  Patient will improve her Berg Balance Test score to 40/56 or more as a demonstration of improved balance and decreased fall risk.    Baseline  33/56 (09/30/2018); 43/56 (11/05/2018), (12/24/18); 45/56 (02/06/2019)    Time  6    Period  Weeks    Status  Achieved      PT LONG TERM GOAL #8   Title  Patient will improve her Berg Balance Test score to 47/56 or more as a demonstration of improved balance and decreased fall risk.    Baseline  45/56 (02/06/2019)    Time  6    Period  Weeks    Status  New    Target Date  03/20/19            Plan - 02/25/19 1306    Clinical Impression Statement  Pt arrived late so session adjusted accordingly. Continued working on LE strengthening to promote ability to support herself during  standing tasks as well as balance to help decrease fall risk. Pt tolerated session well without aggravation of symptoms. Better able to place her foot further forward during tandem stance today. PT will benefit from continued skilled physical therapy services to improve balance, strength and function.    Personal Factors and Comorbidities  Age;Comorbidity 3+;Fitness;Past/Current Experience;Time since onset of injury/illness/exacerbation    Comorbidities  Memory deficit, rheumatoid arthritis, dyspnea, arthritis, B TKA, hx of hysterectomy, myelodysplastic syndrome, arthritis multiple areas    Examination-Activity Limitations  Locomotion Level;Carry;Stairs;Stand    Stability/Clinical Decision Making  Stable/Uncomplicated    Rehab Potential  Fair    Clinical Impairments Affecting Rehab Potential  sedentary lifestyle, age, medical history and chronicity of condition.    PT Frequency  2x / week    PT Duration  6 weeks    PT Treatment/Interventions  Aquatic Therapy;Cryotherapy;Moist Heat;Gait training;Stair training;Functional mobility training;Therapeutic activities;Therapeutic exercise;Patient/family education;Neuromuscular re-education;Balance training;Manual techniques;Taping;Energy conservation;Passive range of motion;Electrical Stimulation;Iontophoresis 72m/ml Dexamethasone;Dry needling    PT Next Visit Plan  Continue with core strengthening, hip strenghtening.    PT Home Exercise Plan  MSharon SpringsAccess code: [email or web address]   Consulted and Agree with Plan of Care  Patient;Family member/caregiver    Family Member Consulted  husband/caregiver       Patient will benefit from skilled therapeutic intervention in order to improve the following deficits and impairments:  Abnormal gait, Pain, Postural dysfunction, Decreased strength, Difficulty walking, Improper body mechanics, Decreased balance  Visit Diagnosis: Chronic pain of left knee  Unsteadiness on feet  Difficulty in walking, not elsewhere  classified  Chronic bilateral low back pain, unspecified whether sciatica present  Radiculopathy, lumbosacral region  Muscle weakness (generalized)  Pain in thoracic spine     Problem List Patient Active Problem List   Diagnosis Date Noted  . Protein-calorie malnutrition ([place] 08/27/2017  . 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 Cubital canal compression syndrome, right 04/18/2017  . Primary osteoarthritis of knee 03/25/2017  . 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 Pure hypercholesterolemia 08/17/2016  . MDS (myelodysplastic syndrome) ([place] 05/12/2016  . B12 deficiency 05/04/2016  . Essential hypertension 05/04/2016  . Mixed stress and urge urinary incontinence 05/04/2016  .  Rheumatoid arthritis involving multiple sites with positive rheumatoid factor ([place]) 05/04/2016    Joneen Boers PT, DPT   02/25/2019, 4:02 PM  [facility] [address] [place], [address] Phone: [phone]   Fax:  [phone]  Name: Sylvia Miller MRN: [id number] Date of Birth: May 22, 1940

## 2019-03-04 ENCOUNTER — Ambulatory Visit: Payer: Medicare Other

## 2019-03-04 ENCOUNTER — Other Ambulatory Visit: Payer: Self-pay

## 2019-03-04 DIAGNOSIS — R2681 Unsteadiness on feet: Secondary | ICD-10-CM

## 2019-03-04 DIAGNOSIS — M6281 Muscle weakness (generalized): Secondary | ICD-10-CM

## 2019-03-04 DIAGNOSIS — G8929 Other chronic pain: Secondary | ICD-10-CM

## 2019-03-04 DIAGNOSIS — M25562 Pain in left knee: Secondary | ICD-10-CM

## 2019-03-04 DIAGNOSIS — M546 Pain in thoracic spine: Secondary | ICD-10-CM

## 2019-03-04 DIAGNOSIS — M5417 Radiculopathy, lumbosacral region: Secondary | ICD-10-CM

## 2019-03-04 DIAGNOSIS — R262 Difficulty in walking, not elsewhere classified: Secondary | ICD-10-CM

## 2019-03-04 NOTE — Therapy (Signed)
[facility] [address], [place], [address] Phone: [phone]   Fax:  [phone]  Physical Therapy Treatment  Patient Details  Name: Sylvia [place] MRN: [id number] Date of Birth: 11/29/1940 Referring Provider (PT): Marlowe Sax, MD   Encounter Date: 03/04/2019  PT End of Session - 03/04/19 1522    Visit Number  12    Number of Visits  6    Date for PT Re-Evaluation  03/20/19    Authorization Type  4    Authorization Time Period  of 10 progress report    PT Start Time  1524   pt arrived late   PT Stop Time  1605    PT Time Calculation (min)  41 min    Activity Tolerance  Other (comment)    Behavior During Therapy  WFL for tasks assessed/performed       Past Medical History:  Diagnosis Date  . Allergy   . Anemia   . Arthritis    rheumatoid /knees/hands/shoulders (Right shoulder) infusion every 5 weeks  . Bladder incontinence   . Chicken pox   . Dyspnea    with exertion or far walking  . Gastric ulcer   . GERD (gastroesophageal reflux disease)   . Heart murmur   . Hypercholesteremia   . Hyperlipidemia, unspecified   . Hypertension    controlled with meds;   Marland Kitchen MDS (myelodysplastic syndrome) ([place]) 2000  . Memory deficit   . Muscular dystrophy ([place])    per patient, this is incorrect  . Myelodysplastic syndrome ([place])   . Myelodysplastic syndrome ([place])   . Rheumatoid arthritis ([place])   . Ulcer     Past Surgical History:  Procedure Laterality Date  . ABDOMINAL HYSTERECTOMY    . APPENDECTOMY     as a child  . CHOLECYSTECTOMY    . COLONOSCOPY    . COLONOSCOPY WITH PROPOFOL N/A 04/24/2017   Procedure: COLONOSCOPY WITH PROPOFOL;  Surgeon: Lollie Sails, MD;  Location: [facility] ENDOSCOPY;  Service: Endoscopy;  Laterality: N/A;  . ESOPHAGOGASTRODUODENOSCOPY (EGD) WITH PROPOFOL N/A 04/24/2017   Procedure: ESOPHAGOGASTRODUODENOSCOPY (EGD) WITH PROPOFOL;  Surgeon: Lollie Sails,  MD;  Location: [facility] ENDOSCOPY;  Service: Endoscopy;  Laterality: N/A;  . JOINT REPLACEMENT    . KNEE ARTHROPLASTY Right 02/21/2017   Procedure: COMPUTER ASSISTED TOTAL KNEE ARTHROPLASTY;  Surgeon: Dereck Leep, MD;  Location: [facility] ORS;  Service: Orthopedics;  Laterality: Right;  . KNEE ARTHROPLASTY Left 08/06/2017   Procedure: COMPUTER ASSISTED TOTAL KNEE ARTHROPLASTY;  Surgeon: Dereck Leep, MD;  Location: [facility] ORS;  Service: Orthopedics;  Laterality: Left;  . NM INTRAVENOUS INJECTION ([facility] HX)     receives biologic infusions every 5 weeks for rheumatoid arthritis. followed by dr. Meda Coffee at [facility] [place] [facility]  . TONSILLECTOMY    . UPPER GASTROINTESTINAL ENDOSCOPY    . WISDOM TOOTH EXTRACTION      There were no vitals filed for this visit.  Subjective Assessment - 03/04/19 1526    Subjective  L knee is still a little problematic. Other than that, things are pretty much ok. 6-7/10 L knee pain when walking.    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 Pt had L TKA 08/05/2017. Last 3-4 months pt has been having pain L lateral leg. Pt also states feeling pain in her upper back  including her B shoulders. Mid back pain travels down her spine to her low back and feel radiating  pain down her L lateral leg. R LE seems fine.   Denies loss of bowel or bladder control, or saddle anesthesia.  No falls within the last 6 months.  Low back and L lateral leg pain seems to have increased since onset.     Patient Stated Goals  Walk more comfortably. Stand longer than a minute at church.    Currently in Pain?  Yes    Pain Score  7    6-7/10   Pain Location  Knee    Pain Orientation  Left    Pain Onset  More than a month ago                               PT Education - 03/04/19 1525    Education provided  Yes    Education Details  ther-ex    [employer]) Educated  Patient    Methods  Explanation;Demonstration;Tactile cues;Verbal cues    Comprehension   Returned demonstration;Verbalized understanding        Objective   Medbridge Access Code: Access Code: [email or web address]     Patient arrived late to appointment, modified accordingly   Pt states L knee has been a problems since she was 78 years old   Manual therapy    Seated STM L IT band, vastus lateralis,medial thighto promote mobility      Therapeutic exercise   Standing alternating toe taps onto treadmill platform 10x each LE without UE assist to light touch assist and CGA to min A from PT.    SLS with B UE assist             L LE 10x5 seconds               R LE 10x5 seconds     Standing with one foot forward, (not directly in front of other foot, one foot length between feet) no UE assist (upgrade) to get into position CGA R foot forward 30 seconds x2 no UE assist and CGA L foot forward 30 seconds x2 no UE assist and CGA   Improving ability to step further forward and maintain position without UE assist   Sit <> stand from regular chair with arms 5x2 without UE assist . Cues to place her body weight on top of her feet to promote better quad and glute muscle use to stand up.   Forward step ups ontoAir Ex Padwith L LE andRUE assistto promote LE strength and stability L LE2x10    Improved exercise technique, movement at target joints, use of target muscles after mod verbal, visual, tactile cues.    Pt response to treatment Therapeutic rest breaks provided secondary to fatigueand to allow muscle recovery for next exercise.Pt tolerated session well without aggravation of symptoms.   Clinical impression Improving ability to maintain balance in semi tandem stance with foot further in front as well as ability to get into that position without UE assist. Able to perform standing alternating toe taps onto treadmill platform without UE assist the majority of the time but with CGA to min A  from PT.  Continued working on LE strength and balance to help decrease fall risk and improve ability to perform standing tasks with less difficulty. Also worked on decreasing soft tissue tension L lateral thigh to help promote better mechanics at her knee. Pt tolerated session well without aggravation of symptoms. Pt will benefit from continued skilled physical; therapy services to improve  strength, balance and function.       PT Short Term Goals - 08/08/18 1826      PT SHORT TERM GOAL #1   Title  Patient will be independent with her HEP to improve strength, decrease pain, improve function    Time  3    Period  Weeks    Status  On-going    Target Date  05/23/18        PT Long Term Goals - 02/06/19 1944      PT LONG TERM GOAL #1   Title  Patient will have a decrease in mid to low back pain to 5/10 or less at worst to promote ability to ambulate more comfortably and tolerate standing longer.     Baseline  8/10 back pain at worst for the past 2 months (possible greater than 8/10 at worst based on pt reports but pt seems to have difficulty with the pain scale; 04/30/2018); 5-6/10 mid to low back pain at most for the past month (08/08/2018); 7/10 back pain at worst for the past 7 days (09/24/2018); 6-7/10 back pain at most for the past 7 days (11/05/2018), (12/24/2018), (02/06/2019)    Time  6    Period  Weeks    Status  On-going    Target Date  03/20/19      PT LONG TERM GOAL #2   Title  Pt will have a decrease in L lateral leg pain to 5/10 or less at worst  to promote ability to ambulate more comfortably and tolerate standing longer.     Baseline  10/10 at worst for the past 2 months (04/30/2018); 7-8/10 L lateral leg pain/tingling at worst for the past month (08/08/2018). 7/10 tingling at most for the past 7 days (09/24/2018); 6-7/10 at most for the past 7 days (11/05/2018), (12/24/2018); 8/10 at most for the past 7 days (02/06/2019)    Time  6    Period  Weeks    Status  On-going    Target Date   03/20/19      PT LONG TERM GOAL #3   Title  Patient will have a decrease in L knee joint pain to 5/10 or less at worst  to promote ability to ambulate more comfortably and tolerate standing longer.     Baseline  10/10 L knee pain at worst for the past 2 months (04/30/2018); 8/10 at worst for the past month (08/08/2018); 7-8/10 L knee pain at worst for the past 7 days (09/24/2018); 6-7/10 at worst for the past 7 days (11/05/2018), (12/24/2018); 6/10 at worst for the past 7 days (02/06/2019)    Time  6    Period  Weeks    Status  On-going    Target Date  03/20/19      PT LONG TERM GOAL #4   Title  Pt will improve B LE strength by at least 1/2 MMT grade  to promote ability to ambulate more comfortably and tolerate standing longer.     Time  6    Period  Weeks    Status  Partially Met    Target Date  03/20/19      PT LONG TERM GOAL #5   Title  Pt will report being able to tolerate standing to 5 minutes or more to promote ability to stand at church as well as improve ability to perform chores.     Baseline  Pt able to tolerate standing 1 min per subjective reports. (04/30/2018); half a minute  per pt (08/08/2018); 2 minutes 8 seconds with light touch assist (09/24/2018); able to stand 2 min and 50 seconds prior to sitting down (11/05/2018). Able to stand at least 6 minutes (12/2018)    Time  6    Period  Weeks    Status  Achieved      PT LONG TERM GOAL #6   Title  Patient will improve her TUG time using SPC to 15 seconds or less as a demonstration of improved functional mobility and balance.    Baseline  26.3 seconds on average using SPC (09/30/2018); 17 seconds average with SPC (11/05/2018); 17.48 seconds without the SPC (12/24/2018); 17 seconds (02/04/2019)    Time  6    Period  Weeks    Status  Partially Met    Target Date  03/20/19      PT LONG TERM GOAL #7   Title  Patient will improve her Berg Balance Test score to 40/56 or more as a demonstration of improved balance and decreased fall risk.     Baseline  33/56 (09/30/2018); 43/56 (11/05/2018), (12/24/18); 45/56 (02/06/2019)    Time  6    Period  Weeks    Status  Achieved      PT LONG TERM GOAL #8   Title  Patient will improve her Berg Balance Test score to 47/56 or more as a demonstration of improved balance and decreased fall risk.    Baseline  45/56 (02/06/2019)    Time  6    Period  Weeks    Status  New    Target Date  03/20/19            Plan - 03/04/19 1522    Clinical Impression Statement  Improving ability to maintain balance in semi tandem stance with foot further in front as well as ability to get into that position without UE assist. Able to perform standing alternating toe taps onto treadmill platform without UE assist the majority of the time but with CGA to min A from PT.  Continued working on LE strength and balance to help decrease fall risk and improve ability to perform standing tasks with less difficulty. Also worked on decreasing soft tissue tension L lateral thigh to help promote better mechanics at her knee. Pt tolerated session well without aggravation of symptoms. Pt will benefit from continued skilled physical; therapy services to improve strength, balance and function.    Personal Factors and Comorbidities  Age;Comorbidity 3+;Fitness;Past/Current Experience;Time since onset of injury/illness/exacerbation    Comorbidities  Memory deficit, rheumatoid arthritis, dyspnea, arthritis, B TKA, hx of hysterectomy, myelodysplastic syndrome, arthritis multiple areas    Examination-Activity Limitations  Locomotion Level;Carry;Stairs;Stand    Stability/Clinical Decision Making  Stable/Uncomplicated    Rehab Potential  Fair    Clinical Impairments Affecting Rehab Potential  sedentary lifestyle, age, medical history and chronicity of condition.    PT Frequency  2x / week    PT Duration  6 weeks    PT Treatment/Interventions  Aquatic Therapy;Cryotherapy;Moist Heat;Gait training;Stair training;Functional mobility  training;Therapeutic activities;Therapeutic exercise;Patient/family education;Neuromuscular re-education;Balance training;Manual techniques;Taping;Energy conservation;Passive range of motion;Electrical Stimulation;Iontophoresis 60m/ml Dexamethasone;Dry needling    PT Next Visit Plan  Continue with core strengthening, hip strenghtening.    PT Home Exercise Plan  MNew DealAccess code: [email or web address]   Consulted and Agree with Plan of Care  Patient;Family member/caregiver    Family Member Consulted  husband/caregiver       Patient will benefit from skilled therapeutic intervention in order to improve the  following deficits and impairments:  Abnormal gait, Pain, Postural dysfunction, Decreased strength, Difficulty walking, Improper body mechanics, Decreased balance  Visit Diagnosis: Chronic pain of left knee  Unsteadiness on feet  Difficulty in walking, not elsewhere classified  Chronic bilateral low back pain, unspecified whether sciatica present  Radiculopathy, lumbosacral region  Muscle weakness (generalized)  Pain in thoracic spine     Problem List Patient Active Problem List   Diagnosis Date Noted  . Protein-calorie malnutrition ([place]) 08/27/2017  . 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 Cubital canal compression syndrome, right 04/18/2017  . Primary osteoarthritis of knee 03/25/2017  . 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 Pure hypercholesterolemia 08/17/2016  . MDS (myelodysplastic syndrome) ([place]) 05/12/2016  . B12 deficiency 05/04/2016  . Essential hypertension 05/04/2016  . Mixed stress and urge urinary incontinence 05/04/2016  . Rheumatoid arthritis involving multiple sites with positive rheumatoid factor ([place]) 05/04/2016    Joneen Boers PT, DPT   03/04/2019, 4:34 PM  [facility] [address] [place], [address] Phone: [phone]   Fax:  [phone]  Name: Arlyne Brandes MRN: [id number] Date of Birth: 1940-07-05

## 2019-03-06 ENCOUNTER — Ambulatory Visit: Payer: Medicare Other

## 2019-03-06 ENCOUNTER — Other Ambulatory Visit: Payer: Self-pay

## 2019-03-06 DIAGNOSIS — M5417 Radiculopathy, lumbosacral region: Secondary | ICD-10-CM

## 2019-03-06 DIAGNOSIS — R262 Difficulty in walking, not elsewhere classified: Secondary | ICD-10-CM

## 2019-03-06 DIAGNOSIS — G8929 Other chronic pain: Secondary | ICD-10-CM

## 2019-03-06 DIAGNOSIS — M6281 Muscle weakness (generalized): Secondary | ICD-10-CM

## 2019-03-06 DIAGNOSIS — M545 Low back pain, unspecified: Secondary | ICD-10-CM

## 2019-03-06 DIAGNOSIS — R2681 Unsteadiness on feet: Secondary | ICD-10-CM

## 2019-03-06 DIAGNOSIS — M546 Pain in thoracic spine: Secondary | ICD-10-CM

## 2019-03-06 DIAGNOSIS — M25562 Pain in left knee: Secondary | ICD-10-CM | POA: Diagnosis not present

## 2019-03-06 NOTE — Therapy (Signed)
[facility] [address] [place], [place], [address] Phone: [phone]   Fax:  [phone]  Physical Therapy Treatment  Patient Details  Name: Sylvia Miller MRN: [id number] Date of Birth: 03/31/1940 Referring Provider (PT): Christele Behalal-Bock, MD   Encounter Date: 03/06/2019  PT End of Session - 03/06/19 1521    Visit Number  55    Number of Visits  73    Date for PT Re-Evaluation  03/20/19    Authorization Type  5    Authorization Time Period  of 10 progress report    PT Start Time  1525   pt arrived late   PT Stop Time  1605    PT Time Calculation (min)  40 min    Activity Tolerance  Other (comment)    Behavior During Therapy  WFL for tasks assessed/performed       Past Medical History:  Diagnosis Date  . Allergy   . Anemia   . Arthritis    rheumatoid /knees/hands/shoulders (Right shoulder) infusion every 5 weeks  . Bladder incontinence   . Chicken pox   . Dyspnea    with exertion or far walking  . Gastric ulcer   . GERD (gastroesophageal reflux disease)   . Heart murmur   . Hypercholesteremia   . Hyperlipidemia, unspecified   . Hypertension    controlled with meds;   . MDS (myelodysplastic syndrome) (HCC) 2000  . Memory deficit   . Muscular dystrophy (HCC)    per patient, this is incorrect  . Myelodysplastic syndrome (HCC)   . Myelodysplastic syndrome (HCC)   . Rheumatoid arthritis (HCC)   . Ulcer     Past Surgical History:  Procedure Laterality Date  . ABDOMINAL HYSTERECTOMY    . APPENDECTOMY     as a child  . CHOLECYSTECTOMY    . COLONOSCOPY    . COLONOSCOPY WITH PROPOFOL N/A 04/24/2017   Procedure: COLONOSCOPY WITH PROPOFOL;  Surgeon: Skulskie, Martin U, MD;  Location: [facility] END[facility];  Service: Endoscopy;  Laterality: N/A;  . ESOPHAGOGASTRODUODENOSCOPY (EGD) WITH PROPOFOL N/A 04/24/2017   Procedure: ESOPHAGOGASTRODUODENOSCOPY (EGD) WITH PROPOFOL;  Surgeon: Skulskie, Martin U,  MD;  Location: [facility] ENDOSCOPY;  Service: Endoscopy;  Laterality: N/A;  . JOINT REPLACEMENT    . KNEE ARTHROPLASTY Right 02/21/2017   Procedure: COMPUTER ASSISTED TOTAL KNEE ARTHROPLASTY;  Surgeon: Hooten, James P, MD;  Location: [facility] ORS;  Service: Orthopedics;  Laterality: Right;  . KNEE ARTHROPLASTY Left 08/06/2017   Procedure: COMPUTER ASSISTED TOTAL KNEE ARTHROPLASTY;  Surgeon: Hooten, James P, MD;  Location: [facility] ORS;  Service: Orthopedics;  Laterality: Left;  . NM INTRAVENOUS INJECTION ([facility] HX)     receives biologic infusions every 5 weeks for rheumatoid arthritis. followed by dr. bock at [facility]  . TONSILLECTOMY    . UPPER GASTROINTESTINAL ENDOSCOPY    . WISDOM TOOTH EXTRACTION      There were no vitals filed for this visit.  Subjective Assessment - 03/06/19 1525    Subjective  Pt states feeling exhausted. Back feels sore and tired. Not comfortable. L knee bothers her. Had to elevated last night when sleeping with ice. Does not have another appointment with her surgeon for her knee. 7-8/10 L knee pain currently. L knee sometimes wants to buckle when walking. 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 Pt had L TKA 08/05/2017. Last 3-4 months pt has been having pain   L lateral leg. Pt also states feeling pain in her upper back  including her B shoulders. Mid back pain travels down her spine to her low back and feel radiating pain down her L lateral leg. R LE seems fine.   Denies loss of bowel or bladder control, or saddle anesthesia.  No falls within the last 6 months.  Low back and L lateral leg pain seems to have increased since onset.     Patient Stated Goals  Walk more comfortably. Stand longer than a minute at church.    Currently in Pain?  Yes    Pain Score  8     Pain Location  Knee    Pain Orientation  Left    Pain Onset  More than a month ago                               PT Education -  03/06/19 1532    Education provided  Yes    Education Details  ther-ex    Person(s) Educated  Patient    Methods  Explanation;Demonstration;Tactile cues;Verbal cues    Comprehension  Returned demonstration;Verbalized understanding      Objective   Medbridge Access Code: Access Code: [email or web address]     Patient arrived late to appointment, modified accordingly   Pt states L knee has been a problems since she was 78 years old   Manual therapy   Seated STM to thoracolumbar paraspinal muscles to decrease tension    Possible decrease in L leg pain afterwards though pt unclear.    Therapeutic exercise    Sit <> stand from regular chair with arms 5x2 without UE assist . Cues to place her body weight on top of her feet to promote better quad and glute muscle use to stand up.    SLS with B UE assist L LE 10x5 seconds   R LE 10x5 seconds    Seated L knee extension resisting 4 lbs 10x3 to promote quad strength   Standing alternating toe taps onto treadmill platform 10x each LE with light touch CGA to min A from PT to promote balance and decrease fall risk   Standing hip abduction with B UE assist to promote glute med muscle strength to help decrease lateral lean and genu valgus during gait   R 10x  L 10x    Forward step ups ontoAir Ex Padwith L LE andRUE assistto promote LE strength and stability L LE2x10   Improved exercise technique, movement at target joints, use of target muscles after mod verbal, visual, tactile cues.    Pt response to treatment Therapeutic rest breaks provided secondary to fatigueand to allow muscle recovery for next exercise.Pt tolerated session well without aggravation of symptoms.   Clinical impression Continued working on L LE strength to help decrease instances of buckling sensation. Also continued working on balance related activities to help decrease fall risk. Pt able to  stand up from a seated position without UE assist consistently and was encouraged to continue doing so to decrease pressure and discomfort to her R wrist. Pt tolerated session well without aggravation of symptoms. Pt will benefit from continued skilled physical therapy services to improve strength, balance and function.       PT Short Term Goals - 08/08/18 1826      PT SHORT TERM GOAL #1   Title  Patient will be independent with her HEP to improve strength, decrease pain,   improve function    Time  3    Period  Weeks    Status  On-going    Target Date  05/23/18        PT Long Term Goals - 02/06/19 1944      PT LONG TERM GOAL #1   Title  Patient will have a decrease in mid to low back pain to 5/10 or less at worst to promote ability to ambulate more comfortably and tolerate standing longer.     Baseline  8/10 back pain at worst for the past 2 months (possible greater than 8/10 at worst based on pt reports but pt seems to have difficulty with the pain scale; 04/30/2018); 5-6/10 mid to low back pain at most for the past month (08/08/2018); 7/10 back pain at worst for the past 7 days (09/24/2018); 6-7/10 back pain at most for the past 7 days (11/05/2018), (12/24/2018), (02/06/2019)    Time  6    Period  Weeks    Status  On-going    Target Date  03/20/19      PT LONG TERM GOAL #2   Title  Pt will have a decrease in L lateral leg pain to 5/10 or less at worst  to promote ability to ambulate more comfortably and tolerate standing longer.     Baseline  10/10 at worst for the past 2 months (04/30/2018); 7-8/10 L lateral leg pain/tingling at worst for the past month (08/08/2018). 7/10 tingling at most for the past 7 days (09/24/2018); 6-7/10 at most for the past 7 days (11/05/2018), (12/24/2018); 8/10 at most for the past 7 days (02/06/2019)    Time  6    Period  Weeks    Status  On-going    Target Date  03/20/19      PT LONG TERM GOAL #3   Title  Patient will have a decrease in L knee joint pain to 5/10  or less at worst  to promote ability to ambulate more comfortably and tolerate standing longer.     Baseline  10/10 L knee pain at worst for the past 2 months (04/30/2018); 8/10 at worst for the past month (08/08/2018); 7-8/10 L knee pain at worst for the past 7 days (09/24/2018); 6-7/10 at worst for the past 7 days (11/05/2018), (12/24/2018); 6/10 at worst for the past 7 days (02/06/2019)    Time  6    Period  Weeks    Status  On-going    Target Date  03/20/19      PT LONG TERM GOAL #4   Title  Pt will improve B LE strength by at least 1/2 MMT grade  to promote ability to ambulate more comfortably and tolerate standing longer.     Time  6    Period  Weeks    Status  Partially Met    Target Date  03/20/19      PT LONG TERM GOAL #5   Title  Pt will report being able to tolerate standing to 5 minutes or more to promote ability to stand at church as well as improve ability to perform chores.     Baseline  Pt able to tolerate standing 1 min per subjective reports. (04/30/2018); half a minute per pt (08/08/2018); 2 minutes 8 seconds with light touch assist (09/24/2018); able to stand 2 min and 50 seconds prior to sitting down (11/05/2018). Able to stand at least 6 minutes (12/2018)    Time  6    Period  Weeks    Status  Achieved      PT LONG TERM GOAL #6   Title  Patient will improve her TUG time using SPC to 15 seconds or less as a demonstration of improved functional mobility and balance.    Baseline  26.3 seconds on average using SPC (09/30/2018); 17 seconds average with SPC (11/05/2018); 17.48 seconds without the SPC (12/24/2018); 17 seconds (02/04/2019)    Time  6    Period  Weeks    Status  Partially Met    Target Date  03/20/19      PT LONG TERM GOAL #7   Title  Patient will improve her Berg Balance Test score to 40/56 or more as a demonstration of improved balance and decreased fall risk.    Baseline  33/56 (09/30/2018); 43/56 (11/05/2018), (12/24/18); 45/56 (02/06/2019)    Time  6    Period  Weeks     Status  Achieved      PT LONG TERM GOAL #8   Title  Patient will improve her Berg Balance Test score to 47/56 or more as a demonstration of improved balance and decreased fall risk.    Baseline  45/56 (02/06/2019)    Time  6    Period  Weeks    Status  New    Target Date  03/20/19            Plan - 03/06/19 1520    Clinical Impression Statement  Continued working on L LE strength to help decrease instances of buckling sensation. Also continued working on balance related activities to help decrease fall risk. Pt able to stand up from a seated position without UE assist consistently and was encouraged to continue doing so to decrease pressure and discomfort to her R wrist. Pt tolerated session well without aggravation of symptoms. Pt will benefit from continued skilled physical therapy services to improve strength, balance and function.    Personal Factors and Comorbidities  Age;Comorbidity 3+;Fitness;Past/Current Experience;Time since onset of injury/illness/exacerbation    Comorbidities  Memory deficit, rheumatoid arthritis, dyspnea, arthritis, B TKA, hx of hysterectomy, myelodysplastic syndrome, arthritis multiple areas    Examination-Activity Limitations  Locomotion Level;Carry;Stairs;Stand    Stability/Clinical Decision Making  Stable/Uncomplicated    Rehab Potential  Fair    Clinical Impairments Affecting Rehab Potential  sedentary lifestyle, age, medical history and chronicity of condition.    PT Frequency  2x / week    PT Duration  6 weeks    PT Treatment/Interventions  Aquatic Therapy;Cryotherapy;Moist Heat;Gait training;Stair training;Functional mobility training;Therapeutic activities;Therapeutic exercise;Patient/family education;Neuromuscular re-education;Balance training;Manual techniques;Taping;Energy conservation;Passive range of motion;Electrical Stimulation;Iontophoresis 4mg/ml Dexamethasone;Dry needling    PT Next Visit Plan  Continue with core strengthening, hip  strenghtening.    PT Home Exercise Plan  Medbridge Access code: [email or web address]    Consulted and Agree with Plan of Care  Patient;Family member/caregiver    Family Member Consulted  husband/caregiver       Patient will benefit from skilled therapeutic intervention in order to improve the following deficits and impairments:  Abnormal gait, Pain, Postural dysfunction, Decreased strength, Difficulty walking, Improper body mechanics, Decreased balance  Visit Diagnosis: Chronic pain of left knee  Unsteadiness on feet  Difficulty in walking, not elsewhere classified  Chronic bilateral low back pain, unspecified whether sciatica present  Radiculopathy, lumbosacral region  Muscle weakness (generalized)  Pain in thoracic spine     Problem List Patient Active Problem List   Diagnosis Date Noted  . Protein-calorie malnutrition (HCC) 08/27/2017  .   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 Cubital canal compression syndrome, right 04/18/2017  . Primary osteoarthritis of knee 03/25/2017  . 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 Pure hypercholesterolemia 08/17/2016  . MDS (myelodysplastic syndrome) (HCC) 05/12/2016  . B12 deficiency 05/04/2016  . Essential hypertension 05/04/2016  . Mixed stress and urge urinary incontinence 05/04/2016  . Rheumatoid arthritis involving multiple sites with positive rheumatoid factor (HCC) 05/04/2016      PT, DPT   03/06/2019, 5:06 PM  [facility] [address] [place], [place], [address] Phone: [phone]   Fax:  [phone]  Name: Sylvia Miller MRN: [id number] Date of Birth: 04/27/1940   

## 2019-03-10 ENCOUNTER — Ambulatory Visit: Payer: Medicare Other | Attending: Internal Medicine

## 2019-03-10 ENCOUNTER — Other Ambulatory Visit: Payer: Self-pay

## 2019-03-10 DIAGNOSIS — M25562 Pain in left knee: Secondary | ICD-10-CM | POA: Diagnosis not present

## 2019-03-10 DIAGNOSIS — M6281 Muscle weakness (generalized): Secondary | ICD-10-CM

## 2019-03-10 DIAGNOSIS — G8929 Other chronic pain: Secondary | ICD-10-CM | POA: Diagnosis present

## 2019-03-10 DIAGNOSIS — M5417 Radiculopathy, lumbosacral region: Secondary | ICD-10-CM | POA: Diagnosis present

## 2019-03-10 DIAGNOSIS — M545 Low back pain: Secondary | ICD-10-CM | POA: Diagnosis present

## 2019-03-10 DIAGNOSIS — R2681 Unsteadiness on feet: Secondary | ICD-10-CM | POA: Diagnosis present

## 2019-03-10 DIAGNOSIS — M546 Pain in thoracic spine: Secondary | ICD-10-CM | POA: Diagnosis present

## 2019-03-10 DIAGNOSIS — R262 Difficulty in walking, not elsewhere classified: Secondary | ICD-10-CM | POA: Diagnosis present

## 2019-03-10 NOTE — Therapy (Signed)
[facility] [address] [place], [address] Phone: [phone]   Fax:  [phone]  Physical Therapy Treatment  Patient Details  Name: Sylvia Miller MRN: [id number] Date of Birth: 1940/04/02 Referring Provider (PT): Marlowe Sax, MD   Encounter Date: 03/10/2019  PT End of Session - 03/10/19 0812    Visit Number  [id number]    Number of Visits  10    Date for PT Re-Evaluation  03/20/19    Authorization Type  6    Authorization Time Period  of 10 progress report    PT Start Time  0800    PT Stop Time  0845    PT Time Calculation (min)  45 min    Activity Tolerance  Patient limited by fatigue    Behavior During Therapy  [facility] for tasks assessed/performed       Past Medical History:  Diagnosis Date  . Allergy   . Anemia   . Arthritis    rheumatoid /knees/hands/shoulders (Right shoulder) infusion every 5 weeks  . Bladder incontinence   . Chicken pox   . Dyspnea    with exertion or far walking  . Gastric ulcer   . GERD (gastroesophageal reflux disease)   . Heart murmur   . Hypercholesteremia   . Hyperlipidemia, unspecified   . Hypertension    controlled with meds;   Marland Kitchen MDS (myelodysplastic syndrome) ([place]) 2000  . Memory deficit   . Muscular dystrophy ([place])    per patient, this is incorrect  . Myelodysplastic syndrome ([place])   . Myelodysplastic syndrome ([place])   . Rheumatoid arthritis ([place])   . Ulcer     Past Surgical History:  Procedure Laterality Date  . ABDOMINAL HYSTERECTOMY    . APPENDECTOMY     as a child  . CHOLECYSTECTOMY    . COLONOSCOPY    . COLONOSCOPY WITH PROPOFOL N/A 04/24/2017   Procedure: COLONOSCOPY WITH PROPOFOL;  Surgeon: Lollie Sails, MD;  Location: [facility] ENDOSCOPY;  Service: Endoscopy;  Laterality: N/A;  . ESOPHAGOGASTRODUODENOSCOPY (EGD) WITH PROPOFOL N/A 04/24/2017   Procedure: ESOPHAGOGASTRODUODENOSCOPY (EGD) WITH PROPOFOL;  Surgeon: Lollie Sails, MD;   Location: [facility] ENDOSCOPY;  Service: Endoscopy;  Laterality: N/A;  . JOINT REPLACEMENT    . KNEE ARTHROPLASTY Right 02/21/2017   Procedure: COMPUTER ASSISTED TOTAL KNEE ARTHROPLASTY;  Surgeon: Dereck Leep, MD;  Location: [facility] ORS;  Service: Orthopedics;  Laterality: Right;  . KNEE ARTHROPLASTY Left 08/06/2017   Procedure: COMPUTER ASSISTED TOTAL KNEE ARTHROPLASTY;  Surgeon: Dereck Leep, MD;  Location: [facility] ORS;  Service: Orthopedics;  Laterality: Left;  . NM INTRAVENOUS INJECTION ([facility] HX)     receives biologic infusions every 5 weeks for rheumatoid arthritis. followed by dr. Meda Coffee at [facility]  . TONSILLECTOMY    . UPPER GASTROINTESTINAL ENDOSCOPY    . WISDOM TOOTH EXTRACTION      There were no vitals filed for this visit.  Subjective Assessment - 03/10/19 0804    Subjective  Patient reported family is visiting and she is tired from it. Patient reported some knee stiffness because its cold and a little pain.    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 Pt had L TKA 08/05/2017. Last 3-4 months pt has been having pain L lateral leg. Pt also states feeling pain in her upper back  including her B shoulders. Mid back pain travels down her spine to her low back and feel radiating  pain down her L lateral leg. R LE seems fine.   Denies loss of bowel or bladder control, or saddle anesthesia.  No falls within the last 6 months.  Low back and L lateral leg pain seems to have increased since onset.     Patient Stated Goals  Walk more comfortably. Stand longer than a minute at church.    Currently in Pain?  Yes    Pain Score  5     Pain Location  Knee    Pain Orientation  Left;Right    Pain Descriptors / Indicators  Tightness;Sore    Pain Type  Chronic pain         Objective    Medbridge Access Code: Access Code: [email or web address]         Patient arrived late to appointment, modified accordingly     Pt states L knee has been a problems since she was 79 years  old       TREATMENT     Therapeutic exercise     nustep step no resistance with UE and LE involvement; focus on ROM, breathing, maintaining active pace  Sit <> stand from regular chair with arms 5x2 without UE assist . Cues to place her body weight on top of her feet to promote better quad and glute muscle use to stand up, scoot anteriorly towards edge of chair in preparation     SLS with B UE assist             L LE 10x5 seconds               R LE 10x5 seconds     Seated L knee extension resisting 4 lbs 10x3 to promote quad strength  Seated knee flexion with GTB 3x10 with PT assist   Standing alternating toe taps onto treadmill platform 10x each LE with light touch CGA to min A from PT to promote balance and decrease fall risk    Standing hip abduction with B UE assist to promote glute med muscle strength to help decrease lateral lean and genu valgus during gait              R 10x 2             L 10x 2   Standing hip extension with B UE assist to promote glute max muscle strength to help improve propulsion and step length during gait  R 10x  L 10x     Forward step ups onto Air Ex Pad  with L LE and R UE assist to promote LE strength and stability            R LE x 10            L LE x10       Improved exercise technique, movement at target joints, use of target muscles after mod verbal, visual, tactile cues.           Pt response to treatment Therapeutic rest breaks provided secondary to fatigue and to allow muscle recovery for next exercise. Pt reported no increased pain at end of session.      Clinical impression Patient responded well to some increase in exercise repetitions/new exercises. Needed CGA for standing exercises, 1-2 observable R knee instances of instability noted, no LOB however due to BUE support. The patient often verbally or visually cued to re-orient often during middle of a task to maintain exercise form/technique. Walked to Geologist, engineering for curb  navigation via handheld assist. The patient would benefit from further skilled PT intervention to continue to progress towards goals.      PT Education - 03/10/19 0806    Education provided  Yes    Education Details  therapeutic exercise form/technique    Person(s) Educated  Patient    Methods  Explanation;Tactile cues;Demonstration;Verbal cues    Comprehension  Verbalized understanding;Returned demonstration;Tactile cues required;Verbal cues required       PT Short Term Goals - 08/08/18 1826      PT SHORT TERM GOAL #1   Title  Patient will be independent with her HEP to improve strength, decrease pain, improve function    Time  3    Period  Weeks    Status  On-going    Target Date  05/23/18        PT Long Term Goals - 02/06/19 1944      PT LONG TERM GOAL #1   Title  Patient will have a decrease in mid to low back pain to 5/10 or less at worst to promote ability to ambulate more comfortably and tolerate standing longer.     Baseline  8/10 back pain at worst for the past 2 months (possible greater than 8/10 at worst based on pt reports but pt seems to have difficulty with the pain scale; 04/30/2018); 5-6/10 mid to low back pain at most for the past month (08/08/2018); 7/10 back pain at worst for the past 7 days (09/24/2018); 6-7/10 back pain at most for the past 7 days (11/05/2018), (12/24/2018), (02/06/2019)    Time  6    Period  Weeks    Status  On-going    Target Date  03/20/19      PT LONG TERM GOAL #2   Title  Pt will have a decrease in L lateral leg pain to 5/10 or less at worst  to promote ability to ambulate more comfortably and tolerate standing longer.     Baseline  10/10 at worst for the past 2 months (04/30/2018); 7-8/10 L lateral leg pain/tingling at worst for the past month (08/08/2018). 7/10 tingling at most for the past 7 days (09/24/2018); 6-7/10 at most for the past 7 days (11/05/2018), (12/24/2018); 8/10 at most for the past 7 days (02/06/2019)    Time  6    Period  Weeks     Status  On-going    Target Date  03/20/19      PT LONG TERM GOAL #3   Title  Patient will have a decrease in L knee joint pain to 5/10 or less at worst  to promote ability to ambulate more comfortably and tolerate standing longer.     Baseline  10/10 L knee pain at worst for the past 2 months (04/30/2018); 8/10 at worst for the past month (08/08/2018); 7-8/10 L knee pain at worst for the past 7 days (09/24/2018); 6-7/10 at worst for the past 7 days (11/05/2018), (12/24/2018); 6/10 at worst for the past 7 days (02/06/2019)    Time  6    Period  Weeks    Status  On-going    Target Date  03/20/19      PT LONG TERM GOAL #4   Title  Pt will improve B LE strength by at least 1/2 MMT grade  to promote ability to ambulate more comfortably and tolerate standing longer.     Time  6    Period  Weeks    Status  Partially Met  Target Date  03/20/19      PT LONG TERM GOAL #5   Title  Pt will report being able to tolerate standing to 5 minutes or more to promote ability to stand at church as well as improve ability to perform chores.     Baseline  Pt able to tolerate standing 1 min per subjective reports. (04/30/2018); half a minute per pt (08/08/2018); 2 minutes 8 seconds with light touch assist (09/24/2018); able to stand 2 min and 50 seconds prior to sitting down (11/05/2018). Able to stand at least 6 minutes (12/2018)    Time  6    Period  Weeks    Status  Achieved      PT LONG TERM GOAL #6   Title  Patient will improve her TUG time using SPC to 15 seconds or less as a demonstration of improved functional mobility and balance.    Baseline  26.3 seconds on average using SPC (09/30/2018); 17 seconds average with SPC (11/05/2018); 17.48 seconds without the SPC (12/24/2018); 17 seconds (02/04/2019)    Time  6    Period  Weeks    Status  Partially Met    Target Date  03/20/19      PT LONG TERM GOAL #7   Title  Patient will improve her Berg Balance Test score to 40/56 or more as a demonstration of improved  balance and decreased fall risk.    Baseline  33/56 (09/30/2018); 43/56 (11/05/2018), (12/24/18); 45/56 (02/06/2019)    Time  6    Period  Weeks    Status  Achieved      PT LONG TERM GOAL #8   Title  Patient will improve her Berg Balance Test score to 47/56 or more as a demonstration of improved balance and decreased fall risk.    Baseline  45/56 (02/06/2019)    Time  6    Period  Weeks    Status  New    Target Date  03/20/19            Plan - 03/10/19 0811    Clinical Impression Statement  Patient responded well to some increase in exercise repetitions/new exercises. Needed CGA for standing exercises, 1-2 observable R knee instances of instability noted, no LOB however due to BUE support. The patient often verbally or visually cued to re-orient often during middle of a task to maintain exercise form/technique. Walked to Geologist, engineering for curb navigation via handheld assist. The patient would benefit from further skilled PT intervention to continue to progress towards goals.    Personal Factors and Comorbidities  Age;Comorbidity 3+;Fitness;Past/Current Experience;Time since onset of injury/illness/exacerbation    Comorbidities  Memory deficit, rheumatoid arthritis, dyspnea, arthritis, B TKA, hx of hysterectomy, myelodysplastic syndrome, arthritis multiple areas    Examination-Activity Limitations  Locomotion Level;Carry;Stairs;Stand    Rehab Potential  Fair    Clinical Impairments Affecting Rehab Potential  sedentary lifestyle, age, medical history and chronicity of condition.    PT Frequency  2x / week    PT Duration  6 weeks    PT Treatment/Interventions  Aquatic Therapy;Cryotherapy;Moist Heat;Gait training;Stair training;Functional mobility training;Therapeutic activities;Therapeutic exercise;Patient/family education;Neuromuscular re-education;Balance training;Manual techniques;Taping;Energy conservation;Passive range of motion;Electrical Stimulation;Iontophoresis 40m/ml Dexamethasone;Dry  needling    PT Next Visit Plan  Continue with core strengthening, hip strenghtening.    PT Home Exercise Plan  MLutzAccess code: [email or web address]   Consulted and Agree with Plan of Care  Patient;Family member/caregiver    Family Member Consulted  husband/caregiver  Patient will benefit from skilled therapeutic intervention in order to improve the following deficits and impairments:  Abnormal gait, Pain, Postural dysfunction, Decreased strength, Difficulty walking, Improper body mechanics, Decreased balance  Visit Diagnosis: Chronic pain of left knee  Unsteadiness on feet  Difficulty in walking, not elsewhere classified  Chronic bilateral low back pain, unspecified whether sciatica present  Radiculopathy, lumbosacral region  Muscle weakness (generalized)  Pain in thoracic spine     Problem List Patient Active Problem List   Diagnosis Date Noted  . Protein-calorie malnutrition ([place]) 08/27/2017  . 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 Cubital canal compression syndrome, right 04/18/2017  . Primary osteoarthritis of knee 03/25/2017  . 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 Pure hypercholesterolemia 08/17/2016  . MDS (myelodysplastic syndrome) ([place]) 05/12/2016  . B12 deficiency 05/04/2016  . Essential hypertension 05/04/2016  . Mixed stress and urge urinary incontinence 05/04/2016  . Rheumatoid arthritis involving multiple sites with positive rheumatoid factor ([place]) 05/04/2016    Lieutenant Diego PT, DPT 8:47 AM,03/10/19   [facility] [address], [place], [address] Phone: [phone]   Fax:  [phone]  Name: Sylvia Miller MRN: [id number] Date of Birth: [birth year]

## 2019-03-13 ENCOUNTER — Other Ambulatory Visit: Payer: Self-pay

## 2019-03-13 ENCOUNTER — Ambulatory Visit: Payer: Medicare Other

## 2019-03-13 DIAGNOSIS — M25562 Pain in left knee: Secondary | ICD-10-CM | POA: Diagnosis not present

## 2019-03-13 DIAGNOSIS — M6281 Muscle weakness (generalized): Secondary | ICD-10-CM

## 2019-03-13 DIAGNOSIS — M5417 Radiculopathy, lumbosacral region: Secondary | ICD-10-CM

## 2019-03-13 DIAGNOSIS — R262 Difficulty in walking, not elsewhere classified: Secondary | ICD-10-CM

## 2019-03-13 DIAGNOSIS — R2681 Unsteadiness on feet: Secondary | ICD-10-CM

## 2019-03-13 DIAGNOSIS — G8929 Other chronic pain: Secondary | ICD-10-CM

## 2019-03-13 DIAGNOSIS — M546 Pain in thoracic spine: Secondary | ICD-10-CM

## 2019-03-13 NOTE — Therapy (Signed)
[facility] [address], [place], [address] Phone: [phone]   Fax:  [phone]  Physical Therapy Treatment  Patient Details  Name: Sylvia Miller MRN: [id number] Date of Birth: 1940-08-12 Referring Provider (PT): Marlowe Sax, MD   Encounter Date: 03/13/2019  PT End of Session - 03/13/19 1605    Visit Number  85    Number of Visits  66    Date for PT Re-Evaluation  03/20/19    Authorization Type  7    Authorization Time Period  of 10 progress report    PT Start Time  1605    PT Stop Time  1646    PT Time Calculation (min)  41 min    Activity Tolerance  Patient limited by fatigue    Behavior During Therapy  [facility] for tasks assessed/performed       Past Medical History:  Diagnosis Date  . Allergy   . Anemia   . Arthritis    rheumatoid /knees/hands/shoulders (Right shoulder) infusion every 5 weeks  . Bladder incontinence   . Chicken pox   . Dyspnea    with exertion or far walking  . Gastric ulcer   . GERD (gastroesophageal reflux disease)   . Heart murmur   . Hypercholesteremia   . Hyperlipidemia, unspecified   . Hypertension    controlled with meds;   Marland Kitchen MDS (myelodysplastic syndrome) ([place]) 2000  . Memory deficit   . Muscular dystrophy ([place])    per patient, this is incorrect  . Myelodysplastic syndrome ([place])   . Myelodysplastic syndrome ([place])   . Rheumatoid arthritis ([place])   . Ulcer     Past Surgical History:  Procedure Laterality Date  . ABDOMINAL HYSTERECTOMY    . APPENDECTOMY     as a child  . CHOLECYSTECTOMY    . COLONOSCOPY    . COLONOSCOPY WITH PROPOFOL N/A 04/24/2017   Procedure: COLONOSCOPY WITH PROPOFOL;  Surgeon: Lollie Sails, MD;  Location: [facility] ENDOSCOPY;  Service: Endoscopy;  Laterality: N/A;  . ESOPHAGOGASTRODUODENOSCOPY (EGD) WITH PROPOFOL N/A 04/24/2017   Procedure: ESOPHAGOGASTRODUODENOSCOPY (EGD) WITH PROPOFOL;  Surgeon: Lollie Sails, MD;   Location: [facility] ENDOSCOPY;  Service: Endoscopy;  Laterality: N/A;  . JOINT REPLACEMENT    . KNEE ARTHROPLASTY Right 02/21/2017   Procedure: COMPUTER ASSISTED TOTAL KNEE ARTHROPLASTY;  Surgeon: Dereck Leep, MD;  Location: [facility] ORS;  Service: Orthopedics;  Laterality: Right;  . KNEE ARTHROPLASTY Left 08/06/2017   Procedure: COMPUTER ASSISTED TOTAL KNEE ARTHROPLASTY;  Surgeon: Dereck Leep, MD;  Location: [facility] ORS;  Service: Orthopedics;  Laterality: Left;  . NM INTRAVENOUS INJECTION ([facility] HX)     receives biologic infusions every 5 weeks for rheumatoid arthritis. followed by dr. Meda Coffee at [facility]  . TONSILLECTOMY    . UPPER GASTROINTESTINAL ENDOSCOPY    . WISDOM TOOTH EXTRACTION      There were no vitals filed for this visit.  Subjective Assessment - 03/13/19 1606    Subjective  Doing ok. Walking pt thinks is pretty good. Does not notice the back. 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 Pt had L TKA 08/05/2017. Last 3-4 months pt has been having pain L lateral leg. Pt also states feeling pain in her upper back  including her B shoulders. Mid back pain travels down her spine to her low back and feel radiating pain  down her L lateral leg. R LE seems fine.   Denies loss of bowel or bladder control, or saddle anesthesia.  No falls within the last 6 months.  Low back and L lateral leg pain seems to have increased since onset.     Patient Stated Goals  Walk more comfortably. Stand longer than a minute at church.    Currently in Pain?  Yes    Pain Score  7    L knee                              PT Education - 03/13/19 1613    Education provided  Yes    Education Details  ther-ex    Person(s) Educated  Patient    Methods  Explanation;Demonstration;Tactile cues;Verbal cues    Comprehension  Returned demonstration;Verbalized understanding      Objective   Medbridge Access Code: Access  Code: [email or web address]     Patient arrived late to appointment, modified accordingly   Pt states L knee has been a problems since she was 79 years old    Therapeutic exercise    Sit <> stand from regular chair with arms 10x(upgrade) without UE assist .    Forward step ups ontoAir Ex Padwith L LE andRUE assistto promote LE strength and stability R LE x 10                   L LE x10   SLS with B UE assist L LE 4x5 seconds. R hip discomfort  R LE 10x5 seconds  Seated L knee extension resisting 4 lbs 10x3to promote quad strength  Seated knee flexion with RTB 1x. L knee discomfort. Posterior glide of leg palpated during knee flexion resisting red band, difficult for pt to perform as well.   Seated manually resisted L knee flexion, 6x10. More comfortable for pt, no posterior glide palpated with proper amount of resistance applied. Pt demonstrates weak hamstring strength.   Seated L hip extension isometrics with L foot on Air Ex pad to promote L glute strength and L knee stability   10x5 seconds for 3 sets (last 6 repetitions, no Air Ex pad)  Seated hip adduction ball and glute max squeeze 10x10 seconds  Then 10x5 seconds    Improved exercise technique, movement at target joints, use of target muscles after mod verbal, visual, tactile cues.    Pt response to treatment Therapeutic rest breaks provided secondary to fatigueand to allow muscle recovery for next exercise.Fair tolerance to today's session.    Clinical impression Continued working on L LE strength and balance to decrease fall risk and improve function. Demonstrates L hamstring weakness as well as increased posterior glide with performing knee flexion resisting red theraband (difficult for pt). No posterior glide with PT manual resistance application. Possible need to gradually improve L hamstring strength and knee stability exercises. Pt will  benefit from continued skilled physical therapy services to decrease pain, improve strength, balance and decrease difficulty with gait.    PT Short Term Goals - 08/08/18 1826      PT SHORT TERM GOAL #1   Title  Patient will be independent with her HEP to improve strength, decrease pain, improve function    Time  3    Period  Weeks    Status  On-going    Target Date  05/23/18        PT Long Term Goals - 02/06/19 1944  PT LONG TERM GOAL #1   Title  Patient will have a decrease in mid to low back pain to 5/10 or less at worst to promote ability to ambulate more comfortably and tolerate standing longer.     Baseline  8/10 back pain at worst for the past 2 months (possible greater than 8/10 at worst based on pt reports but pt seems to have difficulty with the pain scale; 04/30/2018); 5-6/10 mid to low back pain at most for the past month (08/08/2018); 7/10 back pain at worst for the past 7 days (09/24/2018); 6-7/10 back pain at most for the past 7 days (11/05/2018), (12/24/2018), (02/06/2019)    Time  6    Period  Weeks    Status  On-going    Target Date  03/20/19      PT LONG TERM GOAL #2   Title  Pt will have a decrease in L lateral leg pain to 5/10 or less at worst  to promote ability to ambulate more comfortably and tolerate standing longer.     Baseline  10/10 at worst for the past 2 months (04/30/2018); 7-8/10 L lateral leg pain/tingling at worst for the past month (08/08/2018). 7/10 tingling at most for the past 7 days (09/24/2018); 6-7/10 at most for the past 7 days (11/05/2018), (12/24/2018); 8/10 at most for the past 7 days (02/06/2019)    Time  6    Period  Weeks    Status  On-going    Target Date  03/20/19      PT LONG TERM GOAL #3   Title  Patient will have a decrease in L knee joint pain to 5/10 or less at worst  to promote ability to ambulate more comfortably and tolerate standing longer.     Baseline  10/10 L knee pain at worst for the past 2 months (04/30/2018); 8/10 at worst for  the past month (08/08/2018); 7-8/10 L knee pain at worst for the past 7 days (09/24/2018); 6-7/10 at worst for the past 7 days (11/05/2018), (12/24/2018); 6/10 at worst for the past 7 days (02/06/2019)    Time  6    Period  Weeks    Status  On-going    Target Date  03/20/19      PT LONG TERM GOAL #4   Title  Pt will improve B LE strength by at least 1/2 MMT grade  to promote ability to ambulate more comfortably and tolerate standing longer.     Time  6    Period  Weeks    Status  Partially Met    Target Date  03/20/19      PT LONG TERM GOAL #5   Title  Pt will report being able to tolerate standing to 5 minutes or more to promote ability to stand at church as well as improve ability to perform chores.     Baseline  Pt able to tolerate standing 1 min per subjective reports. (04/30/2018); half a minute per pt (08/08/2018); 2 minutes 8 seconds with light touch assist (09/24/2018); able to stand 2 min and 50 seconds prior to sitting down (11/05/2018). Able to stand at least 6 minutes (12/2018)    Time  6    Period  Weeks    Status  Achieved      PT LONG TERM GOAL #6   Title  Patient will improve her TUG time using SPC to 15 seconds or less as a demonstration of improved functional mobility and balance.  Baseline  26.3 seconds on average using SPC (09/30/2018); 17 seconds average with SPC (11/05/2018); 17.48 seconds without the SPC (12/24/2018); 17 seconds (02/04/2019)    Time  6    Period  Weeks    Status  Partially Met    Target Date  03/20/19      PT LONG TERM GOAL #7   Title  Patient will improve her Berg Balance Test score to 40/56 or more as a demonstration of improved balance and decreased fall risk.    Baseline  33/56 (09/30/2018); 43/56 (11/05/2018), (12/24/18); 45/56 (02/06/2019)    Time  6    Period  Weeks    Status  Achieved      PT LONG TERM GOAL #8   Title  Patient will improve her Berg Balance Test score to 47/56 or more as a demonstration of improved balance and decreased fall risk.     Baseline  45/56 (02/06/2019)    Time  6    Period  Weeks    Status  New    Target Date  03/20/19            Plan - 03/13/19 1613    Clinical Impression Statement  Continued working on L LE strength and balance to decrease fall risk and improve function. Demonstrates L hamstring weakness as well as increased posterior glide with performing knee flexion resisting red theraband (difficult for pt). No posterior glide with PT manual resistance application. Possible need to gradually improve L hamstring strength and knee stability exercises. Pt will benefit from continued skilled physical therapy services to decrease pain, improve strength, balance and decrease difficulty with gait.    Personal Factors and Comorbidities  Age;Comorbidity 3+;Fitness;Past/Current Experience;Time since onset of injury/illness/exacerbation    Comorbidities  Memory deficit, rheumatoid arthritis, dyspnea, arthritis, B TKA, hx of hysterectomy, myelodysplastic syndrome, arthritis multiple areas    Examination-Activity Limitations  Locomotion Level;Carry;Stairs;Stand    Rehab Potential  Fair    Clinical Impairments Affecting Rehab Potential  sedentary lifestyle, age, medical history and chronicity of condition.    PT Frequency  2x / week    PT Duration  6 weeks    PT Treatment/Interventions  Aquatic Therapy;Cryotherapy;Moist Heat;Gait training;Stair training;Functional mobility training;Therapeutic activities;Therapeutic exercise;Patient/family education;Neuromuscular re-education;Balance training;Manual techniques;Taping;Energy conservation;Passive range of motion;Electrical Stimulation;Iontophoresis 60m/ml Dexamethasone;Dry needling    PT Next Visit Plan  Continue with core strengthening, hip strenghtening.    PT Home Exercise Plan  [email or web address] code: [email or web address]   Consulted and Agree with Plan of Care  Patient;Family member/caregiver    Family Member Consulted  husband/caregiver       Patient will benefit from  skilled therapeutic intervention in order to improve the following deficits and impairments:  Abnormal gait, Pain, Postural dysfunction, Decreased strength, Difficulty walking, Improper body mechanics, Decreased balance  Visit Diagnosis: Chronic pain of left knee  Unsteadiness on feet  Difficulty in walking, not elsewhere classified  Chronic bilateral low back pain, unspecified whether sciatica present  Radiculopathy, lumbosacral region  Muscle weakness (generalized)  Pain in thoracic spine     Problem List Patient Active Problem List   Diagnosis Date Noted  . Protein-calorie malnutrition ([place] 08/27/2017  . 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 Cubital canal compression syndrome, right 04/18/2017  . Primary osteoarthritis of knee 03/25/2017  . 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 Pure hypercholesterolemia 08/17/2016  . MDS (myelodysplastic syndrome) ([place] 05/12/2016  . B12 deficiency 05/04/2016  . Essential hypertension 05/04/2016  . Mixed stress  and urge urinary incontinence 05/04/2016  . Rheumatoid arthritis involving multiple sites with positive rheumatoid factor ([place]) 05/04/2016    Joneen Boers PT, DPT   03/13/2019, 6:40 PM  [facility] [address] [place], [address] Phone: [phone]   Fax:  [phone]  Name: Mariela Rex MRN: [id number] Date of Birth: 09/27/1940

## 2019-03-18 ENCOUNTER — Other Ambulatory Visit: Payer: Self-pay

## 2019-03-18 ENCOUNTER — Ambulatory Visit: Payer: Medicare Other

## 2019-03-18 DIAGNOSIS — R262 Difficulty in walking, not elsewhere classified: Secondary | ICD-10-CM

## 2019-03-18 DIAGNOSIS — M25562 Pain in left knee: Secondary | ICD-10-CM | POA: Diagnosis not present

## 2019-03-18 DIAGNOSIS — R2681 Unsteadiness on feet: Secondary | ICD-10-CM

## 2019-03-18 DIAGNOSIS — M546 Pain in thoracic spine: Secondary | ICD-10-CM

## 2019-03-18 DIAGNOSIS — M6281 Muscle weakness (generalized): Secondary | ICD-10-CM

## 2019-03-18 DIAGNOSIS — M5417 Radiculopathy, lumbosacral region: Secondary | ICD-10-CM

## 2019-03-18 DIAGNOSIS — G8929 Other chronic pain: Secondary | ICD-10-CM

## 2019-03-18 NOTE — Therapy (Signed)
[facility] [address], [place], [address] Phone: [phone]   Fax:  [phone]  Physical Therapy Treatment  Patient Details  Name: Sylvia Miller MRN: [id number] Date of Birth: 08/15/1940 Referring Provider (PT): Marlowe Sax, MD   Encounter Date: 03/18/2019  PT End of Session - 03/18/19 0906    Visit Number  79    Number of Visits  73    Date for PT Re-Evaluation  03/20/19    Authorization Type  8    Authorization Time Period  of 10 progress report    PT Start Time  0906    PT Stop Time  0946    PT Time Calculation (min)  40 min    Activity Tolerance  Patient limited by fatigue    Behavior During Therapy  [facility] for tasks assessed/performed       Past Medical History:  Diagnosis Date  . Allergy   . Anemia   . Arthritis    rheumatoid /knees/hands/shoulders (Right shoulder) infusion every 5 weeks  . Bladder incontinence   . Chicken pox   . Dyspnea    with exertion or far walking  . Gastric ulcer   . GERD (gastroesophageal reflux disease)   . Heart murmur   . Hypercholesteremia   . Hyperlipidemia, unspecified   . Hypertension    controlled with meds;   Marland Kitchen MDS (myelodysplastic syndrome) ([place]) 2000  . Memory deficit   . Muscular dystrophy ([place])    per patient, this is incorrect  . Myelodysplastic syndrome ([place])   . Myelodysplastic syndrome ([place])   . Rheumatoid arthritis ([place])   . Ulcer     Past Surgical History:  Procedure Laterality Date  . ABDOMINAL HYSTERECTOMY    . APPENDECTOMY     as a child  . CHOLECYSTECTOMY    . COLONOSCOPY    . COLONOSCOPY WITH PROPOFOL N/A 04/24/2017   Procedure: COLONOSCOPY WITH PROPOFOL;  Surgeon: Lollie Sails, MD;  Location: [facility] ENDOSCOPY;  Service: Endoscopy;  Laterality: N/A;  . ESOPHAGOGASTRODUODENOSCOPY (EGD) WITH PROPOFOL N/A 04/24/2017   Procedure: ESOPHAGOGASTRODUODENOSCOPY (EGD) WITH PROPOFOL;  Surgeon: Lollie Sails, MD;   Location: [facility] ENDOSCOPY;  Service: Endoscopy;  Laterality: N/A;  . JOINT REPLACEMENT    . KNEE ARTHROPLASTY Right 02/21/2017   Procedure: COMPUTER ASSISTED TOTAL KNEE ARTHROPLASTY;  Surgeon: Dereck Leep, MD;  Location: [facility] ORS;  Service: Orthopedics;  Laterality: Right;  . KNEE ARTHROPLASTY Left 08/06/2017   Procedure: COMPUTER ASSISTED TOTAL KNEE ARTHROPLASTY;  Surgeon: Dereck Leep, MD;  Location: [facility] ORS;  Service: Orthopedics;  Laterality: Left;  . NM INTRAVENOUS INJECTION ([facility] HX)     receives biologic infusions every 5 weeks for rheumatoid arthritis. followed by dr. Meda Coffee at [facility]  . TONSILLECTOMY    . UPPER GASTROINTESTINAL ENDOSCOPY    . WISDOM TOOTH EXTRACTION      There were no vitals filed for this visit.  Subjective Assessment - 03/18/19 0907    Subjective  Pt states doing ok. A little out of breath after all the hassles in the morning. Her husband makes things difficulty. L knee always bothers her especially when cold outside. 7/10 currently L knee. Does not have very much pain in her back at all currently.    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 Pt had L TKA 08/05/2017. Last 3-4 months pt has been having pain L lateral leg. Pt also states feeling pain  in her upper back  including her B shoulders. Mid back pain travels down her spine to her low back and feel radiating pain down her L lateral leg. R LE seems fine.   Denies loss of bowel or bladder control, or saddle anesthesia.  No falls within the last 6 months.  Low back and L lateral leg pain seems to have increased since onset.     Patient Stated Goals  Walk more comfortably. Stand longer than a minute at church.    Currently in Pain?  Yes    Pain Score  7                                PT Education - 03/18/19 0919    Education provided  Yes    Education Details  ther-ex    Person(s) Educated  Patient    Methods  Explanation;Demonstration;Tactile  cues;Verbal cues    Comprehension  Returned demonstration;Verbalized understanding       Objective   Medbridge Access Code: Access Code: [email or web address]      Pt states L knee has been a problems since she was 79 years old   Manual therapy  seated STM hamstrings, L IT band to help decrease posterior glide of proximal tibia   Therapeutic exercise  Seated manually resisted L knee flexion 10x2 Seated manually resisted L knee extension 10x3   Seated gentle P to A to proximal tibia static hold 6 seconds. Decreased pain to 6-7/10  Sit <> stand from regular chair with arms 10x2(upgrade) without UE assist .    Forward step ups ontoAir Ex Padwith L LE andRUE assistto promote LE strength and stability L LE 3x10  Lateral step up onto Air Ex pad with B UE assist   L 10x2  Pt was recommended to give her L knee surgeon, Dr. Marry Guan a call secondary to L knee posterior glide with knee flexion. Pt verbalized understanding.   With pt permission, husband was informed who said that they saw Dr. Marry Guan last week who took x-rays for both knees.   Upon review of surgeon note 03/12/2018, surgeon did not find anything wrong with the knee replacement.    Improved exercise technique, movement at target joints, use of target muscles after mod verbal, visual, tactile cues.    Pt response to treatment Therapeutic rest breaks provided secondary to fatigueand to allow muscle recovery for next exercise.Pt tolerated session well without aggravation of symptoms.    Clinical impression Decreased L knee pain with gentle P to A static hold to decrease posterior glide as well as with L quadriceps strengthening in closed chain position. Pt states no L knee pain with gait after session. Pt will benefit from continued skilled physical therapy services to decrease pain, improve strength, balance, and function.        PT Short Term Goals - 08/08/18 1826      PT  SHORT TERM GOAL #1   Title  Patient will be independent with her HEP to improve strength, decrease pain, improve function    Time  3    Period  Weeks    Status  On-going    Target Date  05/23/18        PT Long Term Goals - 02/06/19 1944      PT LONG TERM GOAL #1   Title  Patient will have a decrease in mid to low back pain to 5/10 or less at  worst to promote ability to ambulate more comfortably and tolerate standing longer.     Baseline  8/10 back pain at worst for the past 2 months (possible greater than 8/10 at worst based on pt reports but pt seems to have difficulty with the pain scale; 04/30/2018); 5-6/10 mid to low back pain at most for the past month (08/08/2018); 7/10 back pain at worst for the past 7 days (09/24/2018); 6-7/10 back pain at most for the past 7 days (11/05/2018), (12/24/2018), (02/06/2019)    Time  6    Period  Weeks    Status  On-going    Target Date  03/20/19      PT LONG TERM GOAL #2   Title  Pt will have a decrease in L lateral leg pain to 5/10 or less at worst  to promote ability to ambulate more comfortably and tolerate standing longer.     Baseline  10/10 at worst for the past 2 months (04/30/2018); 7-8/10 L lateral leg pain/tingling at worst for the past month (08/08/2018). 7/10 tingling at most for the past 7 days (09/24/2018); 6-7/10 at most for the past 7 days (11/05/2018), (12/24/2018); 8/10 at most for the past 7 days (02/06/2019)    Time  6    Period  Weeks    Status  On-going    Target Date  03/20/19      PT LONG TERM GOAL #3   Title  Patient will have a decrease in L knee joint pain to 5/10 or less at worst  to promote ability to ambulate more comfortably and tolerate standing longer.     Baseline  10/10 L knee pain at worst for the past 2 months (04/30/2018); 8/10 at worst for the past month (08/08/2018); 7-8/10 L knee pain at worst for the past 7 days (09/24/2018); 6-7/10 at worst for the past 7 days (11/05/2018), (12/24/2018); 6/10 at worst for the past 7 days  (02/06/2019)    Time  6    Period  Weeks    Status  On-going    Target Date  03/20/19      PT LONG TERM GOAL #4   Title  Pt will improve B LE strength by at least 1/2 MMT grade  to promote ability to ambulate more comfortably and tolerate standing longer.     Time  6    Period  Weeks    Status  Partially Met    Target Date  03/20/19      PT LONG TERM GOAL #5   Title  Pt will report being able to tolerate standing to 5 minutes or more to promote ability to stand at church as well as improve ability to perform chores.     Baseline  Pt able to tolerate standing 1 min per subjective reports. (04/30/2018); half a minute per pt (08/08/2018); 2 minutes 8 seconds with light touch assist (09/24/2018); able to stand 2 min and 50 seconds prior to sitting down (11/05/2018). Able to stand at least 6 minutes (12/2018)    Time  6    Period  Weeks    Status  Achieved      PT LONG TERM GOAL #6   Title  Patient will improve her TUG time using SPC to 15 seconds or less as a demonstration of improved functional mobility and balance.    Baseline  26.3 seconds on average using SPC (09/30/2018); 17 seconds average with SPC (11/05/2018); 17.48 seconds without the SPC (12/24/2018); 17 seconds (02/04/2019)  Time  6    Period  Weeks    Status  Partially Met    Target Date  03/20/19      PT LONG TERM GOAL #7   Title  Patient will improve her Berg Balance Test score to 40/56 or more as a demonstration of improved balance and decreased fall risk.    Baseline  33/56 (09/30/2018); 43/56 (11/05/2018), (12/24/18); 45/56 (02/06/2019)    Time  6    Period  Weeks    Status  Achieved      PT LONG TERM GOAL #8   Title  Patient will improve her Berg Balance Test score to 47/56 or more as a demonstration of improved balance and decreased fall risk.    Baseline  45/56 (02/06/2019)    Time  6    Period  Weeks    Status  New    Target Date  03/20/19            Plan - 03/18/19 0905    Clinical Impression Statement   Decreased L knee pain with gentle P to A static hold to decrease posterior glide as well as with L quadriceps strengthening in closed chain position. Pt states no L knee pain with gait after session. Pt will benefit from continued skilled physical therapy services to decrease pain, improve strength, balance, and function.    Personal Factors and Comorbidities  Age;Comorbidity 3+;Fitness;Past/Current Experience;Time since onset of injury/illness/exacerbation    Comorbidities  Memory deficit, rheumatoid arthritis, dyspnea, arthritis, B TKA, hx of hysterectomy, myelodysplastic syndrome, arthritis multiple areas    Examination-Activity Limitations  Locomotion Level;Carry;Stairs;Stand    Rehab Potential  Fair    Clinical Impairments Affecting Rehab Potential  sedentary lifestyle, age, medical history and chronicity of condition.    PT Frequency  2x / week    PT Duration  6 weeks    PT Treatment/Interventions  Aquatic Therapy;Cryotherapy;Moist Heat;Gait training;Stair training;Functional mobility training;Therapeutic activities;Therapeutic exercise;Patient/family education;Neuromuscular re-education;Balance training;Manual techniques;Taping;Energy conservation;Passive range of motion;Electrical Stimulation;Iontophoresis 21m/ml Dexamethasone;Dry needling    PT Next Visit Plan  Continue with core strengthening, hip strenghtening.    PT Home Exercise Plan  MVernon CenterAccess code: [email or web address]   Consulted and Agree with Plan of Care  Patient;Family member/caregiver    Family Member Consulted  husband/caregiver       Patient will benefit from skilled therapeutic intervention in order to improve the following deficits and impairments:  Abnormal gait, Pain, Postural dysfunction, Decreased strength, Difficulty walking, Improper body mechanics, Decreased balance  Visit Diagnosis: Chronic pain of left knee  Unsteadiness on feet  Difficulty in walking, not elsewhere classified  Chronic bilateral low back pain,  unspecified whether sciatica present  Radiculopathy, lumbosacral region  Muscle weakness (generalized)  Pain in thoracic spine     Problem List Patient Active Problem List   Diagnosis Date Noted  . Protein-calorie malnutrition ([place] 08/27/2017  . 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 Cubital canal compression syndrome, right 04/18/2017  . Primary osteoarthritis of knee 03/25/2017  . 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 Pure hypercholesterolemia 08/17/2016  . MDS (myelodysplastic syndrome) ([place] 05/12/2016  . B12 deficiency 05/04/2016  . Essential hypertension 05/04/2016  . Mixed stress and urge urinary incontinence 05/04/2016  . Rheumatoid arthritis involving multiple sites with positive rheumatoid factor ([place] 05/04/2016    MJoneen BoersPT, DPT   03/18/2019, 5:08 PM  [facility] [address] [place] [address]Phone:  [phone]   Fax:  [phone]  Name: Sylvia Miller MRN: [id number] Date of Birth: [birth year]

## 2019-03-20 ENCOUNTER — Ambulatory Visit: Payer: Medicare Other

## 2019-03-20 ENCOUNTER — Other Ambulatory Visit: Payer: Self-pay

## 2019-03-20 DIAGNOSIS — G8929 Other chronic pain: Secondary | ICD-10-CM

## 2019-03-20 DIAGNOSIS — M6281 Muscle weakness (generalized): Secondary | ICD-10-CM

## 2019-03-20 DIAGNOSIS — M25562 Pain in left knee: Secondary | ICD-10-CM

## 2019-03-20 DIAGNOSIS — M5417 Radiculopathy, lumbosacral region: Secondary | ICD-10-CM

## 2019-03-20 DIAGNOSIS — M546 Pain in thoracic spine: Secondary | ICD-10-CM

## 2019-03-20 DIAGNOSIS — R2681 Unsteadiness on feet: Secondary | ICD-10-CM

## 2019-03-20 DIAGNOSIS — R262 Difficulty in walking, not elsewhere classified: Secondary | ICD-10-CM

## 2019-03-20 DIAGNOSIS — M545 Low back pain, unspecified: Secondary | ICD-10-CM

## 2019-03-20 NOTE — Therapy (Signed)
[facility] [address] [place], [address] Phone: [phone]   Fax:  [phone]  Physical Therapy Treatment  Patient Details  Name: Sylvia Miller MRN: [id number] Date of Birth: 05/31/1940 Referring Provider (PT): Marlowe Sax, MD   Encounter Date: 03/20/2019  PT End of Session - 03/20/19 0853    Visit Number  34    Number of Visits  73    Date for PT Re-Evaluation  04/17/19    Authorization Type  9    Authorization Time Period  of 10 progress report    PT Start Time  0854    PT Stop Time  0939    PT Time Calculation (min)  45 min    Activity Tolerance  Patient limited by fatigue    Behavior During Therapy  [facility] for tasks assessed/performed       Past Medical History:  Diagnosis Date  . Allergy   . Anemia   . Arthritis    rheumatoid /knees/hands/shoulders (Right shoulder) infusion every 5 weeks  . Bladder incontinence   . Chicken pox   . Dyspnea    with exertion or far walking  . Gastric ulcer   . GERD (gastroesophageal reflux disease)   . Heart murmur   . Hypercholesteremia   . Hyperlipidemia, unspecified   . Hypertension    controlled with meds;   Marland Kitchen MDS (myelodysplastic syndrome) ([place]) 2000  . Memory deficit   . Muscular dystrophy ([place])    per patient, this is incorrect  . Myelodysplastic syndrome ([place])   . Myelodysplastic syndrome ([place])   . Rheumatoid arthritis ([place])   . Ulcer     Past Surgical History:  Procedure Laterality Date  . ABDOMINAL HYSTERECTOMY    . APPENDECTOMY     as a child  . CHOLECYSTECTOMY    . COLONOSCOPY    . COLONOSCOPY WITH PROPOFOL N/A 04/24/2017   Procedure: COLONOSCOPY WITH PROPOFOL;  Surgeon: Lollie Sails, MD;  Location: [facility] ENDOSCOPY;  Service: Endoscopy;  Laterality: N/A;  . ESOPHAGOGASTRODUODENOSCOPY (EGD) WITH PROPOFOL N/A 04/24/2017   Procedure: ESOPHAGOGASTRODUODENOSCOPY (EGD) WITH PROPOFOL;  Surgeon: Lollie Sails, MD;   Location: [facility] ENDOSCOPY;  Service: Endoscopy;  Laterality: N/A;  . JOINT REPLACEMENT    . KNEE ARTHROPLASTY Right 02/21/2017   Procedure: COMPUTER ASSISTED TOTAL KNEE ARTHROPLASTY;  Surgeon: Dereck Leep, MD;  Location: [facility] ORS;  Service: Orthopedics;  Laterality: Right;  . KNEE ARTHROPLASTY Left 08/06/2017   Procedure: COMPUTER ASSISTED TOTAL KNEE ARTHROPLASTY;  Surgeon: Dereck Leep, MD;  Location: [facility] ORS;  Service: Orthopedics;  Laterality: Left;  . NM INTRAVENOUS INJECTION ([facility] HX)     receives biologic infusions every 5 weeks for rheumatoid arthritis. followed by dr. Meda Coffee at [facility]  . TONSILLECTOMY    . UPPER GASTROINTESTINAL ENDOSCOPY    . WISDOM TOOTH EXTRACTION      There were no vitals filed for this visit.  Subjective Assessment - 03/20/19 0856    Subjective  Pt states R wrist is better (pt was recommended to perform sit <> stand without UE assist). L knee feels a little strange. 4-5/10 L knee pain currently. L knee has not been too bad for the past 7 days. Back is pretty good. No back pain so far this morning. Still early. Wants to continue PT to improve her strength. Still feels like she is not as strong as she would like.    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 Pt had L TKA 08/05/2017. Last 3-4 months pt has been having pain L lateral leg. Pt also states feeling pain in her upper back  including her B shoulders. Mid back pain travels down her spine to her low back and feel radiating pain down her L lateral leg. R LE seems fine.   Denies loss of bowel or bladder control, or saddle anesthesia.  No falls within the last 6 months.  Low back and L lateral leg pain seems to have increased since onset.     Patient Stated Goals  Walk more comfortably. Stand longer than a minute at church.    Currently in Pain?  Yes    Pain Score  5          [facility] PT Assessment - 03/20/19 0908      Strength   Right Hip Flexion  4/5    Right Hip  Extension  4/5   seated manually resisted   Right Hip ABduction  5/5   seated manually resisted clamshell isometrics   Left Hip Flexion  4+/5    Left Hip Extension  4+/5   seated manually resisted   Left Hip ABduction  5/5   seated manually resisted clamshell isometrics   Right Knee Flexion  4+/5    Right Knee Extension  5/5    Left Knee Flexion  4+/5    Left Knee Extension  5/5      Berg Balance Test   Sit to Stand  Able to stand without using hands and stabilize independently    Standing Unsupported  Able to stand safely 2 minutes    Sitting with Back Unsupported but Feet Supported on Floor or Stool  Able to sit safely and securely 2 minutes    Stand to Sit  Sits safely with minimal use of hands    Transfers  Able to transfer safely, minor use of hands    Standing Unsupported with Eyes Closed  Able to stand 10 seconds safely    Standing Unsupported with Feet Together  Able to place feet together independently and stand 1 minute safely    From Standing, Reach Forward with Outstretched Arm  Can reach forward >12 cm safely (5")    From Standing Position, Pick up Object from Floor  Able to pick up shoe safely and easily    From Standing Position, Turn to Look Behind Over each Shoulder  Looks behind from both sides and weight shifts well    Turn 360 Degrees  Able to turn 360 degrees safely but slowly   7 seconds each direction   Standing Unsupported, Alternately Place Feet on Step/Stool  Able to stand independently and safely and complete 8 steps in 20 seconds    Standing Unsupported, One Foot in Front  Able to plae foot ahead of the other independently and hold 30 seconds    Standing on One Leg  Able to lift leg independently and hold equal to or more than 3 seconds   5 seconds on R LE, 4 seconds on L LE   Total Score  50    Berg comment:  < 46/56 suggests increased need for AD and increased fall risk                           PT Education - 03/20/19 0902     Education provided  Yes    Education Details  ther-ex  Person(s) Educated  Patient    Methods  Explanation;Demonstration;Tactile cues;Verbal cues    Comprehension  Returned demonstration;Verbalized understanding      Objective   Medbridge Access Code: Access Code: [email or web address]      Pt states L knee has been a problems since she was 79 years old     Therapeutic exercise  Sit <> stand from regular chair without UE assist 10x  Standing up from a chair, walking 10 ft forward, then returning 10 ft, then sitting back onto chair 3x  CGA  Without AD: 21 sec, 19 seconds, 17 seconds (19 seconds average)  No LOB   Directed patient with sit <> stand throughout session Stand pivot transfer chair <> low mat table Static standing shoulder width apart, then with eyes closed, then with eyes open feet together, tandem stance with feet shoulder width apart Picking up an object (1 lb dumbell) from the floor,  Turning 360 degrees to the R and to the L 1x Looking behind to the R and to the L,  Standing forward reach,   Standing alternate toe taps 4 x each LE onto first regular step   Seated manually resisted hip flexion, hip abduction, hip extension, knee extension, knee flexion 1-2x each way for each LE  Improved B knee flexion strength since last measurement.    Reviewed progress/current status with PT towards goals Reviewed plan of care: continue 2x/week for 4 weeks to improve strength      Improved exercise technique, movement at target joints, use of target muscles after mod verbal, visual, tactile cues.    Pt response to treatment Therapeutic rest breaks provided secondary to fatigueand to allow muscle recovery for next exercise.Pt tolerated session well without aggravation of symptoms.    Clinical impression Pt demonstrates slight decreased hip strength but improved B hamstrings strength since last measured. Pt also able to ambulate wihtout  SPC during TUG for an average of 19 seconds without LOB. Pt also demonstrates siginificant overall Improved balance since first measured, scoring 50/56 with the BERG today suggesting decreased fall risk and decreased need for AD. Pt still demonstrates B LE weakness, L knee and leg pain, and difficulty performing tasks such as ambulation and will benefit from continued skilled physical therapy services to address the aforementioned deficits.       PT Short Term Goals - 08/08/18 1826      PT SHORT TERM GOAL #1   Title  Patient will be independent with her HEP to improve strength, decrease pain, improve function    Time  3    Period  Weeks    Status  On-going    Target Date  05/23/18        PT Long Term Goals - 03/20/19 0910      PT LONG TERM GOAL #1   Title  Patient will have a decrease in mid to low back pain to 5/10 or less at worst to promote ability to ambulate more comfortably and tolerate standing longer.     Baseline  8/10 back pain at worst for the past 2 months (possible greater than 8/10 at worst based on pt reports but pt seems to have difficulty with the pain scale; 04/30/2018); 5-6/10 mid to low back pain at most for the past month (08/08/2018); 7/10 back pain at worst for the past 7 days (09/24/2018); 6-7/10 back pain at most for the past 7 days (11/05/2018), (12/24/2018), (02/06/2019)    Time  6    Period  Weeks  Status  On-going    Target Date  04/17/19      PT LONG TERM GOAL #2   Title  Pt will have a decrease in L lateral leg pain to 5/10 or less at worst  to promote ability to ambulate more comfortably and tolerate standing longer.     Baseline  10/10 at worst for the past 2 months (04/30/2018); 7-8/10 L lateral leg pain/tingling at worst for the past month (08/08/2018). 7/10 tingling at most for the past 7 days (09/24/2018); 6-7/10 at most for the past 7 days (11/05/2018), (12/24/2018); 8/10 at most for the past 7 days (02/06/2019)    Time  6    Period  Weeks    Status  On-going     Target Date  04/17/19      PT LONG TERM GOAL #3   Title  Patient will have a decrease in L knee joint pain to 5/10 or less at worst  to promote ability to ambulate more comfortably and tolerate standing longer.     Baseline  10/10 L knee pain at worst for the past 2 months (04/30/2018); 8/10 at worst for the past month (08/08/2018); 7-8/10 L knee pain at worst for the past 7 days (09/24/2018); 6-7/10 at worst for the past 7 days (11/05/2018), (12/24/2018); 6/10 at worst for the past 7 days (02/06/2019)    Time  6    Period  Weeks    Status  On-going    Target Date  04/17/19      PT LONG TERM GOAL #4   Title  Pt will improve B LE strength by at least 1/2 MMT grade  to promote ability to ambulate more comfortably and tolerate standing longer.     Time  6    Period  Weeks    Status  Partially Met    Target Date  04/17/19      PT LONG TERM GOAL #5   Title  Pt will report being able to tolerate standing to 5 minutes or more to promote ability to stand at church as well as improve ability to perform chores.     Baseline  Pt able to tolerate standing 1 min per subjective reports. (04/30/2018); half a minute per pt (08/08/2018); 2 minutes 8 seconds with light touch assist (09/24/2018); able to stand 2 min and 50 seconds prior to sitting down (11/05/2018). Able to stand at least 6 minutes (12/2018)    Time  6    Period  Weeks    Status  Achieved      PT LONG TERM GOAL #6   Title  Patient will improve her TUG time using SPC to 15 seconds or less as a demonstration of improved functional mobility and balance.    Baseline  26.3 seconds on average using SPC (09/30/2018); 17 seconds average with SPC (11/05/2018); 17.48 seconds without the SPC (12/24/2018); 17 seconds (02/04/2019); 19 seconds average without SPC, no LOB (03/20/2019)    Time  6    Period  Weeks    Status  Partially Met    Target Date  04/17/19      PT LONG TERM GOAL #7   Title  Patient will improve her Berg Balance Test score to 40/56 or more as  a demonstration of improved balance and decreased fall risk.    Baseline  33/56 (09/30/2018); 43/56 (11/05/2018), (12/24/18); 45/56 (02/06/2019)    Time  6    Period  Weeks    Status  Achieved  PT LONG TERM GOAL #8   Title  Patient will improve her Merrilee Jansky Balance Test score to 47/56 or more as a demonstration of improved balance and decreased fall risk.    Baseline  45/56 (02/06/2019); 50/56 (03/20/2019)    Time  6    Period  Weeks    Status  Achieved    Target Date  03/20/19            Plan - 03/20/19 0851    Clinical Impression Statement  Pt demonstrates slight decreased hip strength but improved B hamstrings strength since last measured. Pt also able to ambulate wihtout SPC during TUG for an average of 19 seconds without LOB. Pt also demonstrates siginificant overall Improved balance since first measured, scoring 50/56 with the BERG today suggesting decreased fall risk and decreased need for AD. Pt still demonstrates B LE weakness, L knee and leg pain, and difficulty performing tasks such as ambulation and will benefit from continued skilled physical therapy services to address the aforementioned deficits.    Personal Factors and Comorbidities  Age;Comorbidity 3+;Fitness;Past/Current Experience;Time since onset of injury/illness/exacerbation    Comorbidities  Memory deficit, rheumatoid arthritis, dyspnea, arthritis, B TKA, hx of hysterectomy, myelodysplastic syndrome, arthritis multiple areas    Examination-Activity Limitations  Locomotion Level;Carry;Stairs;Stand    Stability/Clinical Decision Making  Stable/Uncomplicated    Clinical Decision Making  Low    Clinical Presentation due to:  improved balance    Rehab Potential  Fair    Clinical Impairments Affecting Rehab Potential  sedentary lifestyle, age, medical history and chronicity of condition.    PT Frequency  2x / week    PT Duration  4 weeks    PT Treatment/Interventions  Aquatic Therapy;Cryotherapy;Moist Heat;Gait  training;Stair training;Functional mobility training;Therapeutic activities;Therapeutic exercise;Patient/family education;Neuromuscular re-education;Balance training;Manual techniques;Taping;Energy conservation;Passive range of motion;Electrical Stimulation;Iontophoresis 82m/ml Dexamethasone;Dry needling    PT Next Visit Plan  Continue with core strengthening, hip strenghtening.    PT Home Exercise Plan  MAthensAccess code: [email or web address]   Consulted and Agree with Plan of Care  Patient;Family member/caregiver    Family Member Consulted  husband/caregiver       Patient will benefit from skilled therapeutic intervention in order to improve the following deficits and impairments:  Abnormal gait, Pain, Postural dysfunction, Decreased strength, Difficulty walking, Improper body mechanics, Decreased balance  Visit Diagnosis: Chronic pain of left knee - Plan: PT plan of care cert/re-cert  Unsteadiness on feet - Plan: PT plan of care cert/re-cert  Difficulty in walking, not elsewhere classified - Plan: PT plan of care cert/re-cert  Chronic bilateral low back pain, unspecified whether sciatica present - Plan: PT plan of care cert/re-cert  Radiculopathy, lumbosacral region - Plan: PT plan of care cert/re-cert  Muscle weakness (generalized) - Plan: PT plan of care cert/re-cert  Pain in thoracic spine - Plan: PT plan of care cert/re-cert     Problem List Patient Active Problem List   Diagnosis Date Noted  . Protein-calorie malnutrition ([place] 08/27/2017  . 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 Cubital canal compression syndrome, right 04/18/2017  . Primary osteoarthritis of knee 03/25/2017  . 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 Pure hypercholesterolemia 08/17/2016  . MDS (myelodysplastic syndrome) ([place] 05/12/2016  . B12 deficiency 05/04/2016  . Essential hypertension 05/04/2016  . Mixed stress and urge urinary incontinence 05/04/2016  . Rheumatoid arthritis  involving multiple sites with positive rheumatoid factor ([place] 05/04/2016    MJoneen BoersPT, DPT   03/20/2019, 2:08 PM  [place] [facility] [address] [place], [address] Phone: [phone]   Fax:  [phone]  Name: Sylvia Miller MRN: [id number] Date of Birth: 08-Feb-1941

## 2019-03-24 ENCOUNTER — Ambulatory Visit: Payer: Medicare Other

## 2019-03-24 ENCOUNTER — Telehealth: Payer: Self-pay

## 2019-03-24 NOTE — Telephone Encounter (Signed)
No show. Called patient on phone number provided but unable to contact her or leave a message. Voice mailbox not set up yet.

## 2019-03-26 ENCOUNTER — Other Ambulatory Visit: Payer: Self-pay

## 2019-03-26 ENCOUNTER — Ambulatory Visit: Payer: Medicare Other

## 2019-03-26 DIAGNOSIS — M546 Pain in thoracic spine: Secondary | ICD-10-CM

## 2019-03-26 DIAGNOSIS — M25562 Pain in left knee: Secondary | ICD-10-CM

## 2019-03-26 DIAGNOSIS — M6281 Muscle weakness (generalized): Secondary | ICD-10-CM

## 2019-03-26 DIAGNOSIS — M5417 Radiculopathy, lumbosacral region: Secondary | ICD-10-CM

## 2019-03-26 DIAGNOSIS — R262 Difficulty in walking, not elsewhere classified: Secondary | ICD-10-CM

## 2019-03-26 DIAGNOSIS — G8929 Other chronic pain: Secondary | ICD-10-CM

## 2019-03-26 DIAGNOSIS — R2681 Unsteadiness on feet: Secondary | ICD-10-CM

## 2019-03-26 NOTE — Therapy (Signed)
[facility] [address], [place], [address] Phone: [phone]   Fax:  [phone]  Physical Therapy Treatment And Progress Report (02/12/2019 - 03/26/2019)  Patient Details  Name: Sylvia Miller MRN: [id number] Date of Birth: [birth year] Referring Provider (PT): Marlowe Sax, MD   Encounter Date: 03/26/2019  PT End of Session - 03/26/19 1532    Visit Number  60    Number of Visits  38    Date for PT Re-Evaluation  04/17/19    Authorization Type  10    Authorization Time Period  of 10 progress report    PT Start Time  1532    PT Stop Time  1613    PT Time Calculation (min)  41 min    Activity Tolerance  Patient limited by fatigue    Behavior During Therapy  [facility] for tasks assessed/performed       Past Medical History:  Diagnosis Date  . Allergy   . Anemia   . Arthritis    rheumatoid /knees/hands/shoulders (Right shoulder) infusion every 5 weeks  . Bladder incontinence   . Chicken pox   . Dyspnea    with exertion or far walking  . Gastric ulcer   . GERD (gastroesophageal reflux disease)   . Heart murmur   . Hypercholesteremia   . Hyperlipidemia, unspecified   . Hypertension    controlled with meds;   Marland Kitchen MDS (myelodysplastic syndrome) ([place]) 2000  . Memory deficit   . Muscular dystrophy ([place])    per patient, this is incorrect  . Myelodysplastic syndrome ([place])   . Myelodysplastic syndrome ([place])   . Rheumatoid arthritis ([place])   . Ulcer     Past Surgical History:  Procedure Laterality Date  . ABDOMINAL HYSTERECTOMY    . APPENDECTOMY     as a child  . CHOLECYSTECTOMY    . COLONOSCOPY    . COLONOSCOPY WITH PROPOFOL N/A 04/24/2017   Procedure: COLONOSCOPY WITH PROPOFOL;  Surgeon: Lollie Sails, MD;  Location: [facility] ENDOSCOPY;  Service: Endoscopy;  Laterality: N/A;  . ESOPHAGOGASTRODUODENOSCOPY (EGD) WITH PROPOFOL N/A 04/24/2017   Procedure: ESOPHAGOGASTRODUODENOSCOPY (EGD) WITH  PROPOFOL;  Surgeon: Lollie Sails, MD;  Location: [facility] ENDOSCOPY;  Service: Endoscopy;  Laterality: N/A;  . JOINT REPLACEMENT    . KNEE ARTHROPLASTY Right 02/21/2017   Procedure: COMPUTER ASSISTED TOTAL KNEE ARTHROPLASTY;  Surgeon: Dereck Leep, MD;  Location: [facility] ORS;  Service: Orthopedics;  Laterality: Right;  . KNEE ARTHROPLASTY Left 08/06/2017   Procedure: COMPUTER ASSISTED TOTAL KNEE ARTHROPLASTY;  Surgeon: Dereck Leep, MD;  Location: [facility] ORS;  Service: Orthopedics;  Laterality: Left;  . NM INTRAVENOUS INJECTION ([facility] HX)     receives biologic infusions every 5 weeks for rheumatoid arthritis. followed by dr. Meda Coffee at [facility]  . TONSILLECTOMY    . UPPER GASTROINTESTINAL ENDOSCOPY    . WISDOM TOOTH EXTRACTION      There were no vitals filed for this visit.  Subjective Assessment - 03/26/19 1534    Subjective  Pt states doing ok. L knee does not like the cold weather. Kind of achy. L knee and medial and lateral thigh pain 6/10 currently.    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 Pt had L TKA 08/05/2017. Last 3-4 months pt has been having pain L lateral leg. Pt also states feeling pain in her upper back  including her B shoulders. Mid back pain travels down her  spine to her low back and feel radiating pain down her L lateral leg. R LE seems fine.   Denies loss of bowel or bladder control, or saddle anesthesia.  No falls within the last 6 months.  Low back and L lateral leg pain seems to have increased since onset.     Patient Stated Goals  Walk more comfortably. Stand longer than a minute at church.    Currently in Pain?  Yes    Pain Score  6                                PT Education - 03/26/19 1538    Education provided  Yes    Education Details  ther-ex    [employer]) Educated  Patient    Methods  Explanation;Demonstration;Tactile cues;Verbal cues    Comprehension  Returned demonstration;Verbalized understanding       Objective   Medbridge Access Code: Access Code: [email or web address]      Pt states L knee has been a problems since she was 79 years old    Therapeutic exercise  Sit <> stand from regular chair with arms10x2without UE assist .   Seated manually resisted L knee flexion 10x3  L knee extension 10x3 with 4 lbs ankle weight   Forward step ups ontoAir Ex Padwith L LE andRUE assistto promote LE strength and stability L LE 3x10  Lateral step up onto Air Ex pad with B UE assist              L 10x2  SLS on L LE with B UE assist 10x5 seconds for 2 sets  seated hip adductor ball and glute max squeeze 10x5 seconds     Improved exercise technique, movement at target joints, use of target muscles after mod verbal, visual, tactile cues.    Pt response to treatment Therapeutic rest breaks provided secondary to fatigueand to allow muscle recovery for next exercise.Pt tolerated session well without aggravation of symptoms.    Clinical impression Pt demonstrates overall improved balance, with pt scoring 50/56 with the Berg Balance test on 03/20/2019 suggesting decreased fall risk. Difficulty with overall improvement of back, L knee and L leg pain in which age, chronicity of condition, medical history, and sedentary lifestyle may play a factor.  Continued working on improving L LE quadriceps strength to help decrease posterior knee instability and improve LE strength to support herself when ambulating. Pt tolerated session well without aggravation of symptoms. Pt will benefit from continued skilled phyiscal therapy services to decrease pain, improve strength and function.       PT Short Term Goals - 08/08/18 1826      PT SHORT TERM GOAL #1   Title  Patient will be independent with her HEP to improve strength, decrease pain, improve function    Time  3    Period  Weeks    Status  On-going    Target Date  05/23/18        PT Long Term  Goals - 03/26/19 1540      PT LONG TERM GOAL #1   Title  Patient will have a decrease in mid to low back pain to 5/10 or less at worst to promote ability to ambulate more comfortably and tolerate standing longer.     Baseline  8/10 back pain at worst for the past 2 months (possible greater than 8/10 at worst based on pt reports but pt  seems to have difficulty with the pain scale; 04/30/2018); 5-6/10 mid to low back pain at most for the past month (08/08/2018); 7/10 back pain at worst for the past 7 days (09/24/2018); 6-7/10 back pain at most for the past 7 days (11/05/2018), (12/24/2018), (02/06/2019); 4/10 at most for the past 7 days (03/26/2019)    Time  6    Period  Weeks    Status  On-going    Target Date  04/17/19      PT LONG TERM GOAL #2   Title  Pt will have a decrease in L lateral leg pain to 5/10 or less at worst  to promote ability to ambulate more comfortably and tolerate standing longer.     Baseline  10/10 at worst for the past 2 months (04/30/2018); 7-8/10 L lateral leg pain/tingling at worst for the past month (08/08/2018). 7/10 tingling at most for the past 7 days (09/24/2018); 6-7/10 at most for the past 7 days (11/05/2018), (12/24/2018); 8/10 at most for the past 7 days (02/06/2019); 4-5/10 (03/26/2019)    Time  6    Period  Weeks    Status  On-going      PT LONG TERM GOAL #3   Title  Patient will have a decrease in L knee joint pain to 5/10 or less at worst  to promote ability to ambulate more comfortably and tolerate standing longer.     Baseline  10/10 L knee pain at worst for the past 2 months (04/30/2018); 8/10 at worst for the past month (08/08/2018); 7-8/10 L knee pain at worst for the past 7 days (09/24/2018); 6-7/10 at worst for the past 7 days (11/05/2018), (12/24/2018); 6/10 at worst for the past 7 days (02/06/2019), 6-7/10 at most for the past 7 days (03/26/2019)    Time  6    Period  Weeks    Status  On-going    Target Date  04/17/19      PT LONG TERM GOAL #4   Title  Pt will  improve B LE strength by at least 1/2 MMT grade  to promote ability to ambulate more comfortably and tolerate standing longer.     Time  6    Period  Weeks    Status  Partially Met      PT LONG TERM GOAL #5   Title  Pt will report being able to tolerate standing to 5 minutes or more to promote ability to stand at church as well as improve ability to perform chores.     Baseline  Pt able to tolerate standing 1 min per subjective reports. (04/30/2018); half a minute per pt (08/08/2018); 2 minutes 8 seconds with light touch assist (09/24/2018); able to stand 2 min and 50 seconds prior to sitting down (11/05/2018). Able to stand at least 6 minutes (12/2018)    Time  6    Period  Weeks    Status  Achieved      PT LONG TERM GOAL #6   Title  Patient will improve her TUG time using SPC to 15 seconds or less as a demonstration of improved functional mobility and balance.    Baseline  26.3 seconds on average using SPC (09/30/2018); 17 seconds average with SPC (11/05/2018); 17.48 seconds without the SPC (12/24/2018); 17 seconds (02/04/2019); 19 seconds average without SPC, no LOB (03/20/2019)    Time  6    Period  Weeks    Status  Partially Met    Target Date  04/17/19  PT LONG TERM GOAL #7   Title  Patient will improve her Merrilee Jansky Balance Test score to 40/56 or more as a demonstration of improved balance and decreased fall risk.    Baseline  33/56 (09/30/2018); 43/56 (11/05/2018), (12/24/18); 45/56 (02/06/2019)    Time  6    Period  Weeks    Status  Achieved      PT LONG TERM GOAL #8   Title  Patient will improve her Berg Balance Test score to 47/56 or more as a demonstration of improved balance and decreased fall risk.    Baseline  45/56 (02/06/2019); 50/56 (03/20/2019)    Time  6    Period  Weeks    Status  Achieved            Plan - 03/26/19 1538    Clinical Impression Statement  Pt demonstrates overall improved balance, with pt scoring 50/56 with the Berg Balance test on 03/20/2019 suggesting  decreased fall risk. Difficulty with overall improvement of back, L knee and L leg pain in which age, chronicity of condition, medical history, and sedentary lifestyle may play a factor.  Continued working on improving L LE quadriceps strength to help decrease posterior knee instability and improve LE strength to support herself when ambulating. Pt tolerated session well without aggravation of symptoms. Pt will benefit from continued skilled phyiscal therapy services to decrease pain, improve strength and function.    Personal Factors and Comorbidities  Age;Comorbidity 3+;Fitness;Past/Current Experience;Time since onset of injury/illness/exacerbation    Comorbidities  Memory deficit, rheumatoid arthritis, dyspnea, arthritis, B TKA, hx of hysterectomy, myelodysplastic syndrome, arthritis multiple areas    Examination-Activity Limitations  Locomotion Level;Carry;Stairs;Stand    Stability/Clinical Decision Making  Stable/Uncomplicated    Clinical Decision Making  Low    Clinical Presentation due to:  improved BERG balance score    Rehab Potential  Fair    Clinical Impairments Affecting Rehab Potential  sedentary lifestyle, age, medical history and chronicity of condition.    PT Frequency  2x / week    PT Duration  4 weeks    PT Treatment/Interventions  Aquatic Therapy;Cryotherapy;Moist Heat;Gait training;Stair training;Functional mobility training;Therapeutic activities;Therapeutic exercise;Patient/family education;Neuromuscular re-education;Balance training;Manual techniques;Taping;Energy conservation;Passive range of motion;Electrical Stimulation;Iontophoresis 60m/ml Dexamethasone;Dry needling    PT Next Visit Plan  Continue with core strengthening, hip strenghtening.    PT Home Exercise Plan  [place] Village[place]: [email or web address]   Consulted and Agree with Plan of Care  Patient;Family member/caregiver    Family Member Consulted  husband/caregiver       Patient will benefit from skilled therapeutic  intervention in order to improve the following deficits and impairments:  Abnormal gait, Pain, Postural dysfunction, Decreased strength, Difficulty walking, Improper body mechanics, Decreased balance  Visit Diagnosis: Chronic pain of left knee  Unsteadiness on feet  Difficulty in walking, not elsewhere classified  Chronic bilateral low back pain, unspecified whether sciatica present  Radiculopathy, lumbosacral region  Muscle weakness (generalized)  Pain in thoracic spine     Problem List Patient Active Problem List   Diagnosis Date Noted  . Protein-calorie malnutrition ([place] 08/27/2017  . 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 Cubital canal compression syndrome, right 04/18/2017  . Primary osteoarthritis of knee 03/25/2017  . 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 Pure hypercholesterolemia 08/17/2016  . MDS (myelodysplastic syndrome) ([place] 05/12/2016  . B12 deficiency 05/04/2016  . Essential hypertension 05/04/2016  . Mixed stress and urge urinary incontinence 05/04/2016  . Rheumatoid arthritis involving multiple sites  with positive rheumatoid factor ([place]) 05/04/2016    Jahmarion Popoff 03/26/2019, 5:20 PM  [facility] [address], [place], [address] Phone: [phone]   Fax:  [phone]  Name: Sylvia Miller MRN: [id number] Date of Birth: [birth year]

## 2019-04-01 ENCOUNTER — Ambulatory Visit: Payer: Medicare Other

## 2019-04-01 ENCOUNTER — Other Ambulatory Visit: Payer: Self-pay

## 2019-04-01 DIAGNOSIS — G8929 Other chronic pain: Secondary | ICD-10-CM

## 2019-04-01 DIAGNOSIS — M25562 Pain in left knee: Secondary | ICD-10-CM

## 2019-04-01 DIAGNOSIS — M6281 Muscle weakness (generalized): Secondary | ICD-10-CM

## 2019-04-01 DIAGNOSIS — M5417 Radiculopathy, lumbosacral region: Secondary | ICD-10-CM

## 2019-04-01 DIAGNOSIS — R262 Difficulty in walking, not elsewhere classified: Secondary | ICD-10-CM

## 2019-04-01 DIAGNOSIS — R2681 Unsteadiness on feet: Secondary | ICD-10-CM

## 2019-04-01 DIAGNOSIS — M546 Pain in thoracic spine: Secondary | ICD-10-CM

## 2019-04-01 NOTE — Therapy (Signed)
[facility] [address] [place], [address] Phone: [phone]   Fax:  [phone]  Physical Therapy Treatment  Patient Details  Name: Sylvia Miller MRN: [id number] Date of Birth: 1941-03-05 Referring Provider (PT): Marlowe Sax, MD   Encounter Date: 04/01/2019  PT End of Session - 04/01/19 0906    Visit Number  61    Number of Visits  27    Date for PT Re-Evaluation  04/17/19    Authorization Type  1    Authorization Time Period  of 10 progress report    PT Start Time  0908   pt used bathroom, started PT late   PT Stop Time  0953    PT Time Calculation (min)  45 min    Activity Tolerance  Patient limited by fatigue    Behavior During Therapy  [facility] for tasks assessed/performed       Past Medical History:  Diagnosis Date  . Allergy   . Anemia   . Arthritis    rheumatoid /knees/hands/shoulders (Right shoulder) infusion every 5 weeks  . Bladder incontinence   . Chicken pox   . Dyspnea    with exertion or far walking  . Gastric ulcer   . GERD (gastroesophageal reflux disease)   . Heart murmur   . Hypercholesteremia   . Hyperlipidemia, unspecified   . Hypertension    controlled with meds;   Marland Kitchen MDS (myelodysplastic syndrome) ([place]) 2000  . Memory deficit   . Muscular dystrophy ([place])    per patient, this is incorrect  . Myelodysplastic syndrome ([place])   . Myelodysplastic syndrome ([place])   . Rheumatoid arthritis ([place])   . Ulcer     Past Surgical History:  Procedure Laterality Date  . ABDOMINAL HYSTERECTOMY    . APPENDECTOMY     as a child  . CHOLECYSTECTOMY    . COLONOSCOPY    . COLONOSCOPY WITH PROPOFOL N/A 04/24/2017   Procedure: COLONOSCOPY WITH PROPOFOL;  Surgeon: Lollie Sails, MD;  Location: [facility] ENDOSCOPY;  Service: Endoscopy;  Laterality: N/A;  . ESOPHAGOGASTRODUODENOSCOPY (EGD) WITH PROPOFOL N/A 04/24/2017   Procedure: ESOPHAGOGASTRODUODENOSCOPY (EGD) WITH PROPOFOL;   Surgeon: Lollie Sails, MD;  Location: [facility] ENDOSCOPY;  Service: Endoscopy;  Laterality: N/A;  . JOINT REPLACEMENT    . KNEE ARTHROPLASTY Right 02/21/2017   Procedure: COMPUTER ASSISTED TOTAL KNEE ARTHROPLASTY;  Surgeon: Dereck Leep, MD;  Location: [facility] ORS;  Service: Orthopedics;  Laterality: Right;  . KNEE ARTHROPLASTY Left 08/06/2017   Procedure: COMPUTER ASSISTED TOTAL KNEE ARTHROPLASTY;  Surgeon: Dereck Leep, MD;  Location: [facility] ORS;  Service: Orthopedics;  Laterality: Left;  . NM INTRAVENOUS INJECTION ([facility] HX)     receives biologic infusions every 5 weeks for rheumatoid arthritis. followed by dr. Meda Coffee at [facility]  . TONSILLECTOMY    . UPPER GASTROINTESTINAL ENDOSCOPY    . WISDOM TOOTH EXTRACTION      There were no vitals filed for this visit.  Subjective Assessment - 04/01/19 0909    Subjective  L knee is about a 6-7/10 when walking. Also does not like the cold. No pain in back. Has tingling L lateral leg when touched.    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 Pt had L TKA 08/05/2017. Last 3-4 months pt has been having pain L lateral leg. Pt also states feeling pain in her upper back  including her B shoulders. Mid back pain travels down  her spine to her low back and feel radiating pain down her L lateral leg. R LE seems fine.   Denies loss of bowel or bladder control, or saddle anesthesia.  No falls within the last 6 months.  Low back and L lateral leg pain seems to have increased since onset.     Patient Stated Goals  Walk more comfortably. Stand longer than a minute at church.    Currently in Pain?  Yes    Pain Score  7    6-7/10 L knee when walking                              PT Education - 04/01/19 0913    Education provided  Yes    Education Details  ther-ex    Person(s) Educated  Patient    Methods  Explanation;Demonstration;Tactile cues;Verbal cues    Comprehension  Verbalized understanding;Returned  demonstration      Objective   Medbridge Access Code: Access Code: [email or web address]      Pt states L knee has been a problems since she was 79 years old    Therapeutic exercise  Sit <> stand from regular chair with arms10x2without UE assist .   Seated bilateral scapular retraction to promote thoracic extension to decrease low back extension pressure 10x3  Forward step ups ontoAir Ex Padwith L LE andRUE assistto promote LE strength and stability L LE3x10  Lateral step up onto Air Ex pad with B UE assist  L 10x2  Standing hip abduction with B UE assist   R 10x2   L knee extension 10x3 with 4 lbs ankle weight     Improved exercise technique, movement at target joints, use of target muscles after mod verbal, visual, tactile cues.   Manual therapy  Seated STM L hamstrings to decrease tension and posterior translation of tibia on femur   Pt response to treatment Therapeutic rest breaks provided secondary to fatigueand to allow muscle recovery for next exercise.Pt tolerated session well without aggravation of symptoms.   Clinical impression Continued working on improving L quadriceps strengthening to help decrease posterior translation of L tibia to help decrease L knee pain. Also worked on improving glute med strength to decrease valgus stress to L knee during gait. Pt tolerated session well without aggravation of symptoms. No L knee pain with gait after session, just tingling based on pt reports.  Pt will benefit from continued skilled physical therapy services to improve LE strength, balance, function, and decrease difficulty with gait.      PT Short Term Goals - 08/08/18 1826      PT SHORT TERM GOAL #1   Title  Patient will be independent with her HEP to improve strength, decrease pain, improve function    Time  3    Period  Weeks    Status  On-going    Target Date  05/23/18        PT Long Term Goals -  03/26/19 1540      PT LONG TERM GOAL #1   Title  Patient will have a decrease in mid to low back pain to 5/10 or less at worst to promote ability to ambulate more comfortably and tolerate standing longer.     Baseline  8/10 back pain at worst for the past 2 months (possible greater than 8/10 at worst based on pt reports but pt seems to have difficulty with the pain scale; 04/30/2018); 5-6/10  mid to low back pain at most for the past month (08/08/2018); 7/10 back pain at worst for the past 7 days (09/24/2018); 6-7/10 back pain at most for the past 7 days (11/05/2018), (12/24/2018), (02/06/2019); 4/10 at most for the past 7 days (03/26/2019)    Time  6    Period  Weeks    Status  On-going    Target Date  04/17/19      PT LONG TERM GOAL #2   Title  Pt will have a decrease in L lateral leg pain to 5/10 or less at worst  to promote ability to ambulate more comfortably and tolerate standing longer.     Baseline  10/10 at worst for the past 2 months (04/30/2018); 7-8/10 L lateral leg pain/tingling at worst for the past month (08/08/2018). 7/10 tingling at most for the past 7 days (09/24/2018); 6-7/10 at most for the past 7 days (11/05/2018), (12/24/2018); 8/10 at most for the past 7 days (02/06/2019); 4-5/10 (03/26/2019)    Time  6    Period  Weeks    Status  On-going      PT LONG TERM GOAL #3   Title  Patient will have a decrease in L knee joint pain to 5/10 or less at worst  to promote ability to ambulate more comfortably and tolerate standing longer.     Baseline  10/10 L knee pain at worst for the past 2 months (04/30/2018); 8/10 at worst for the past month (08/08/2018); 7-8/10 L knee pain at worst for the past 7 days (09/24/2018); 6-7/10 at worst for the past 7 days (11/05/2018), (12/24/2018); 6/10 at worst for the past 7 days (02/06/2019), 6-7/10 at most for the past 7 days (03/26/2019)    Time  6    Period  Weeks    Status  On-going    Target Date  04/17/19      PT LONG TERM GOAL #4   Title  Pt will improve B LE  strength by at least 1/2 MMT grade  to promote ability to ambulate more comfortably and tolerate standing longer.     Time  6    Period  Weeks    Status  Partially Met      PT LONG TERM GOAL #5   Title  Pt will report being able to tolerate standing to 5 minutes or more to promote ability to stand at church as well as improve ability to perform chores.     Baseline  Pt able to tolerate standing 1 min per subjective reports. (04/30/2018); half a minute per pt (08/08/2018); 2 minutes 8 seconds with light touch assist (09/24/2018); able to stand 2 min and 50 seconds prior to sitting down (11/05/2018). Able to stand at least 6 minutes (12/2018)    Time  6    Period  Weeks    Status  Achieved      PT LONG TERM GOAL #6   Title  Patient will improve her TUG time using SPC to 15 seconds or less as a demonstration of improved functional mobility and balance.    Baseline  26.3 seconds on average using SPC (09/30/2018); 17 seconds average with SPC (11/05/2018); 17.48 seconds without the SPC (12/24/2018); 17 seconds (02/04/2019); 19 seconds average without SPC, no LOB (03/20/2019)    Time  6    Period  Weeks    Status  Partially Met    Target Date  04/17/19      PT LONG TERM GOAL #7  Title  Patient will improve her Merrilee Jansky Balance Test score to 40/56 or more as a demonstration of improved balance and decreased fall risk.    Baseline  33/56 (09/30/2018); 43/56 (11/05/2018), (12/24/18); 45/56 (02/06/2019)    Time  6    Period  Weeks    Status  Achieved      PT LONG TERM GOAL #8   Title  Patient will improve her Berg Balance Test score to 47/56 or more as a demonstration of improved balance and decreased fall risk.    Baseline  45/56 (02/06/2019); 50/56 (03/20/2019)    Time  6    Period  Weeks    Status  Achieved            Plan - 04/01/19 0905    Clinical Impression Statement  Continued working on improving L quadriceps strengthening to help decrease posterior translation of L tibia to help decrease L knee  pain. Also worked on improving glute med strength to decrease valgus stress to L knee during gait. Pt tolerated session well without aggravation of symptoms. No L knee pain with gait after session, just tingling based on pt reports.  Pt will benefit from continued skilled physical therapy services to improve LE strength, balance, function, and decrease difficulty with gait.    Personal Factors and Comorbidities  Age;Comorbidity 3+;Fitness;Past/Current Experience;Time since onset of injury/illness/exacerbation    Comorbidities  Memory deficit, rheumatoid arthritis, dyspnea, arthritis, B TKA, hx of hysterectomy, myelodysplastic syndrome, arthritis multiple areas    Examination-Activity Limitations  Locomotion Level;Carry;Stairs;Stand    Stability/Clinical Decision Making  Stable/Uncomplicated    Rehab Potential  Fair    Clinical Impairments Affecting Rehab Potential  sedentary lifestyle, age, medical history and chronicity of condition.    PT Frequency  2x / week    PT Duration  4 weeks    PT Treatment/Interventions  Aquatic Therapy;Cryotherapy;Moist Heat;Gait training;Stair training;Functional mobility training;Therapeutic activities;Therapeutic exercise;Patient/family education;Neuromuscular re-education;Balance training;Manual techniques;Taping;Energy conservation;Passive range of motion;Electrical Stimulation;Iontophoresis 70m/ml Dexamethasone;Dry needling    PT Next Visit Plan  Continue with core strengthening, hip strenghtening.    PT Home Exercise Plan  MRitchieAccess code: [email or web address]   Consulted and Agree with Plan of Care  Patient;Family member/caregiver    Family Member Consulted  husband/caregiver       Patient will benefit from skilled therapeutic intervention in order to improve the following deficits and impairments:  Abnormal gait, Pain, Postural dysfunction, Decreased strength, Difficulty walking, Improper body mechanics, Decreased balance  Visit Diagnosis: Chronic pain of left  knee  Unsteadiness on feet  Difficulty in walking, not elsewhere classified  Chronic bilateral low back pain, unspecified whether sciatica present  Radiculopathy, lumbosacral region  Muscle weakness (generalized)  Pain in thoracic spine     Problem List Patient Active Problem List   Diagnosis Date Noted  . Protein-calorie malnutrition ([place] 08/27/2017  . 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 Cubital canal compression syndrome, right 04/18/2017  . Primary osteoarthritis of knee 03/25/2017  . 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 Pure hypercholesterolemia 08/17/2016  . MDS (myelodysplastic syndrome) ([place] 05/12/2016  . B12 deficiency 05/04/2016  . Essential hypertension 05/04/2016  . Mixed stress and urge urinary incontinence 05/04/2016  . Rheumatoid arthritis involving multiple sites with positive rheumatoid factor ([place] 05/04/2016    MJoneen BoersPT, DPT   04/01/2019, 10:03 AM  [facility] [address] [place] [address]Phone: [phone]  Fax:  [phone]  Name: Sylvia Miller MRN: [id number] Date of Birth: 09-22-1940

## 2019-04-01 NOTE — Patient Instructions (Signed)
Access Code: [email or web address]  URL: [email or web address]  Date: 04/01/2019  Prepared by: Joneen Boers   Exercises Seated Hip Adduction Squeeze with Ball - 10 reps - 3 sets - 5 seconds hold - 1x daily - 7x weekly Seated Scapular Retraction - 10 reps - 3 sets - 5 seconds hold - 1x daily - 7x weekly Standing Hip Abduction with Counter Support - 10 reps - 2 sets - 1x daily - 7x weekly Sit to Stand - 10 reps - 1 sets - 2x daily - 7x weekly

## 2019-04-07 ENCOUNTER — Other Ambulatory Visit: Payer: Self-pay

## 2019-04-07 ENCOUNTER — Ambulatory Visit: Payer: Medicare Other | Attending: Internal Medicine

## 2019-04-07 DIAGNOSIS — G8929 Other chronic pain: Secondary | ICD-10-CM | POA: Insufficient documentation

## 2019-04-07 DIAGNOSIS — R262 Difficulty in walking, not elsewhere classified: Secondary | ICD-10-CM | POA: Insufficient documentation

## 2019-04-07 DIAGNOSIS — M6281 Muscle weakness (generalized): Secondary | ICD-10-CM

## 2019-04-07 DIAGNOSIS — R2681 Unsteadiness on feet: Secondary | ICD-10-CM | POA: Insufficient documentation

## 2019-04-07 DIAGNOSIS — M545 Low back pain, unspecified: Secondary | ICD-10-CM

## 2019-04-07 DIAGNOSIS — M25562 Pain in left knee: Secondary | ICD-10-CM | POA: Insufficient documentation

## 2019-04-07 DIAGNOSIS — M546 Pain in thoracic spine: Secondary | ICD-10-CM | POA: Diagnosis present

## 2019-04-07 DIAGNOSIS — M5417 Radiculopathy, lumbosacral region: Secondary | ICD-10-CM | POA: Insufficient documentation

## 2019-04-07 NOTE — Therapy (Signed)
[facility] [address] [place], [address] Phone: [phone]   Fax:  [phone]  Physical Therapy Treatment  Patient Details  Name: Sylvia Miller MRN: [id number] Date of Birth: [birth year] Referring Provider (PT): Marlowe Sax, MD   Encounter Date: 04/07/2019  PT End of Session - 04/07/19 1301    Visit Number  37    Number of Visits  55    Date for PT Re-Evaluation  04/17/19    Authorization Type  2    Authorization Time Period  of 10 progress report    PT Start Time  1303    PT Stop Time  1331    PT Time Calculation (min)  28 min    Activity Tolerance  Patient limited by fatigue    Behavior During Therapy  [facility] for tasks assessed/performed       Past Medical History:  Diagnosis Date  . Allergy   . Anemia   . Arthritis    rheumatoid /knees/hands/shoulders (Right shoulder) infusion every 5 weeks  . Bladder incontinence   . Chicken pox   . Dyspnea    with exertion or far walking  . Gastric ulcer   . GERD (gastroesophageal reflux disease)   . Heart murmur   . Hypercholesteremia   . Hyperlipidemia, unspecified   . Hypertension    controlled with meds;   Marland Kitchen MDS (myelodysplastic syndrome) ([place]) 2000  . Memory deficit   . Muscular dystrophy ([place])    per patient, this is incorrect  . Myelodysplastic syndrome ([place])   . Myelodysplastic syndrome ([place])   . Rheumatoid arthritis ([place])   . Ulcer     Past Surgical History:  Procedure Laterality Date  . ABDOMINAL HYSTERECTOMY    . APPENDECTOMY     as a child  . CHOLECYSTECTOMY    . COLONOSCOPY    . COLONOSCOPY WITH PROPOFOL N/A 04/24/2017   Procedure: COLONOSCOPY WITH PROPOFOL;  Surgeon: Lollie Sails, MD;  Location: [facility] ENDOSCOPY;  Service: Endoscopy;  Laterality: N/A;  . ESOPHAGOGASTRODUODENOSCOPY (EGD) WITH PROPOFOL N/A 04/24/2017   Procedure: ESOPHAGOGASTRODUODENOSCOPY (EGD) WITH PROPOFOL;  Surgeon: Lollie Sails, MD;   Location: [facility] ENDOSCOPY;  Service: Endoscopy;  Laterality: N/A;  . JOINT REPLACEMENT    . KNEE ARTHROPLASTY Right 02/21/2017   Procedure: COMPUTER ASSISTED TOTAL KNEE ARTHROPLASTY;  Surgeon: Dereck Leep, MD;  Location: [facility] ORS;  Service: Orthopedics;  Laterality: Right;  . KNEE ARTHROPLASTY Left 08/06/2017   Procedure: COMPUTER ASSISTED TOTAL KNEE ARTHROPLASTY;  Surgeon: Dereck Leep, MD;  Location: [facility] ORS;  Service: Orthopedics;  Laterality: Left;  . NM INTRAVENOUS INJECTION ([facility] HX)     receives biologic infusions every 5 weeks for rheumatoid arthritis. followed by dr. Meda Coffee at [facility]  . TONSILLECTOMY    . UPPER GASTROINTESTINAL ENDOSCOPY    . WISDOM TOOTH EXTRACTION      There were no vitals filed for this visit.  Subjective Assessment - 04/07/19 1304    Subjective  Pt states feeling exhausted, does not know why. 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 Pt had L TKA 08/05/2017. Last 3-4 months pt has been having pain L lateral leg. Pt also states feeling pain in her upper back  including her B shoulders. Mid back pain travels down her spine to her low back and feel radiating pain down her L lateral leg. R LE seems fine.  Denies loss of bowel or bladder control, or saddle anesthesia.  No falls within the last 6 months.  Low back and L lateral leg pain seems to have increased since onset.     Patient Stated Goals  Walk more comfortably. Stand longer than a minute at church.    Currently in Pain?  Yes    Pain Score  7                                PT Education - 04/07/19 1308    Education provided  Yes    Education Details  ther-ex    [employer]) Educated  Patient    Methods  Explanation;Demonstration;Tactile cues;Verbal cues    Comprehension  Returned demonstration;Verbalized understanding       Objective   Medbridge Access Code: Access Code: [email or web address]      Pt states L  knee has been a problems since she was 79 years old    Therapeutic exercise  SpO2 95% room air  Sit <> stand from regular chair with arms 5x2    without UE assist .   Seated bilateral scapular retraction to promote thoracic extension to decrease low back extension pressure 7x5 seconds  Forward step ups ontoAir Ex Padwith L LE andRUE assistto promote LE strength and stability L LE2x10  96% SpO2   Lateral step up onto Air Ex pad with B UE assist  L 10x2               99% SpO2 after session, room air   Improved exercise technique, movement at target joints, use of target muscles after mod verbal, visual, tactile cues.   Manual therapy  Seated STM L hamstrings to decrease tension and posterior translation of tibia on femur Seated STM L lateral knee Seated STM L IT band     Pt response to treatment Therapeutic rest breaks provided secondary to fatigueand to allow muscle recovery for next exercise.Pt tolerated session well without aggravation of symptoms.   Clinical impression Adjusted repetition, number of sets for her exercises and length of today's session based on level of fatigue. Pt slightly more fatigued than usual today. Continued working on improving L quadriceps strength to promote stability and ability to support herself with her L LE during gait more comfortably. Pt tolerated session well witout aggravation of symptoms. PT will benefit from continued skilled physical therapy services to improve strength, balance, and function.    PT Short Term Goals - 08/08/18 1826      PT SHORT TERM GOAL #1   Title  Patient will be independent with her HEP to improve strength, decrease pain, improve function    Time  3    Period  Weeks    Status  On-going    Target Date  05/23/18        PT Long Term Goals - 03/26/19 1540      PT LONG TERM GOAL #1   Title  Patient will have a decrease in mid to low back pain to  5/10 or less at worst to promote ability to ambulate more comfortably and tolerate standing longer.     Baseline  8/10 back pain at worst for the past 2 months (possible greater than 8/10 at worst based on pt reports but pt seems to have difficulty with the pain scale; 04/30/2018); 5-6/10 mid to low back pain at most for the past month (08/08/2018); 7/10 back pain  at worst for the past 7 days (09/24/2018); 6-7/10 back pain at most for the past 7 days (11/05/2018), (12/24/2018), (02/06/2019); 4/10 at most for the past 7 days (03/26/2019)    Time  6    Period  Weeks    Status  On-going    Target Date  04/17/19      PT LONG TERM GOAL #2   Title  Pt will have a decrease in L lateral leg pain to 5/10 or less at worst  to promote ability to ambulate more comfortably and tolerate standing longer.     Baseline  10/10 at worst for the past 2 months (04/30/2018); 7-8/10 L lateral leg pain/tingling at worst for the past month (08/08/2018). 7/10 tingling at most for the past 7 days (09/24/2018); 6-7/10 at most for the past 7 days (11/05/2018), (12/24/2018); 8/10 at most for the past 7 days (02/06/2019); 4-5/10 (03/26/2019)    Time  6    Period  Weeks    Status  On-going      PT LONG TERM GOAL #3   Title  Patient will have a decrease in L knee joint pain to 5/10 or less at worst  to promote ability to ambulate more comfortably and tolerate standing longer.     Baseline  10/10 L knee pain at worst for the past 2 months (04/30/2018); 8/10 at worst for the past month (08/08/2018); 7-8/10 L knee pain at worst for the past 7 days (09/24/2018); 6-7/10 at worst for the past 7 days (11/05/2018), (12/24/2018); 6/10 at worst for the past 7 days (02/06/2019), 6-7/10 at most for the past 7 days (03/26/2019)    Time  6    Period  Weeks    Status  On-going    Target Date  04/17/19      PT LONG TERM GOAL #4   Title  Pt will improve B LE strength by at least 1/2 MMT grade  to promote ability to ambulate more comfortably and tolerate standing  longer.     Time  6    Period  Weeks    Status  Partially Met      PT LONG TERM GOAL #5   Title  Pt will report being able to tolerate standing to 5 minutes or more to promote ability to stand at church as well as improve ability to perform chores.     Baseline  Pt able to tolerate standing 1 min per subjective reports. (04/30/2018); half a minute per pt (08/08/2018); 2 minutes 8 seconds with light touch assist (09/24/2018); able to stand 2 min and 50 seconds prior to sitting down (11/05/2018). Able to stand at least 6 minutes (12/2018)    Time  6    Period  Weeks    Status  Achieved      PT LONG TERM GOAL #6   Title  Patient will improve her TUG time using SPC to 15 seconds or less as a demonstration of improved functional mobility and balance.    Baseline  26.3 seconds on average using SPC (09/30/2018); 17 seconds average with SPC (11/05/2018); 17.48 seconds without the SPC (12/24/2018); 17 seconds (02/04/2019); 19 seconds average without SPC, no LOB (03/20/2019)    Time  6    Period  Weeks    Status  Partially Met    Target Date  04/17/19      PT LONG TERM GOAL #7   Title  Patient will improve her Berg Balance Test score to 40/56 or  more as a demonstration of improved balance and decreased fall risk.    Baseline  33/56 (09/30/2018); 43/56 (11/05/2018), (12/24/18); 45/56 (02/06/2019)    Time  6    Period  Weeks    Status  Achieved      PT LONG TERM GOAL #8   Title  Patient will improve her Berg Balance Test score to 47/56 or more as a demonstration of improved balance and decreased fall risk.    Baseline  45/56 (02/06/2019); 50/56 (03/20/2019)    Time  6    Period  Weeks    Status  Achieved            Plan - 04/07/19 1301    Clinical Impression Statement  Adjusted repetition, number of sets for her exercises and length of today's session based on level of fatigue. Pt slightly more fatigued than usual today. Continued working on improving L quadriceps strength to promote stability and  ability to support herself with her L LE during gait more comfortably. Pt tolerated session well witout aggravation of symptoms. PT will benefit from continued skilled physical therapy services to improve strength, balance, and function.    Personal Factors and Comorbidities  Age;Comorbidity 3+;Fitness;Past/Current Experience;Time since onset of injury/illness/exacerbation    Comorbidities  Memory deficit, rheumatoid arthritis, dyspnea, arthritis, B TKA, hx of hysterectomy, myelodysplastic syndrome, arthritis multiple areas    Examination-Activity Limitations  Locomotion Level;Carry;Stairs;Stand    Stability/Clinical Decision Making  Stable/Uncomplicated    Rehab Potential  Fair    Clinical Impairments Affecting Rehab Potential  sedentary lifestyle, age, medical history and chronicity of condition.    PT Frequency  2x / week    PT Duration  4 weeks    PT Treatment/Interventions  Aquatic Therapy;Cryotherapy;Moist Heat;Gait training;Stair training;Functional mobility training;Therapeutic activities;Therapeutic exercise;Patient/family education;Neuromuscular re-education;Balance training;Manual techniques;Taping;Energy conservation;Passive range of motion;Electrical Stimulation;Iontophoresis 43m/ml Dexamethasone;Dry needling    PT Next Visit Plan  Continue with core strengthening, hip strenghtening.    PT Home Exercise Plan  MFruit CoveAccess code: [email or web address]   Consulted and Agree with Plan of Care  Patient;Family member/caregiver    Family Member Consulted  husband/caregiver       Patient will benefit from skilled therapeutic intervention in order to improve the following deficits and impairments:  Abnormal gait, Pain, Postural dysfunction, Decreased strength, Difficulty walking, Improper body mechanics, Decreased balance  Visit Diagnosis: Chronic pain of left knee  Difficulty in walking, not elsewhere classified  Chronic bilateral low back pain, unspecified whether sciatica  present  Radiculopathy, lumbosacral region  Muscle weakness (generalized)  Pain in thoracic spine  Unsteadiness on feet     Problem List Patient Active Problem List   Diagnosis Date Noted  . Protein-calorie malnutrition ([place] 08/27/2017  . 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 Cubital canal compression syndrome, right 04/18/2017  . Primary osteoarthritis of knee 03/25/2017  . 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 Pure hypercholesterolemia 08/17/2016  . MDS (myelodysplastic syndrome) ([place] 05/12/2016  . B12 deficiency 05/04/2016  . Essential hypertension 05/04/2016  . Mixed stress and urge urinary incontinence 05/04/2016  . Rheumatoid arthritis involving multiple sites with positive rheumatoid factor ([place] 05/04/2016    MJoneen BoersPT, DPT   04/07/2019, 1:41 PM  [facility] [address] [place] [id number]: [phone]  Fax:  [phone] Name: Sylvia PounceyMRN: [id number] of Birth: 330-Mar-1942

## 2019-04-09 ENCOUNTER — Other Ambulatory Visit: Payer: Self-pay

## 2019-04-09 ENCOUNTER — Ambulatory Visit: Payer: Medicare Other

## 2019-04-09 DIAGNOSIS — M25562 Pain in left knee: Secondary | ICD-10-CM | POA: Diagnosis not present

## 2019-04-09 DIAGNOSIS — R2681 Unsteadiness on feet: Secondary | ICD-10-CM

## 2019-04-09 DIAGNOSIS — M5417 Radiculopathy, lumbosacral region: Secondary | ICD-10-CM

## 2019-04-09 DIAGNOSIS — M6281 Muscle weakness (generalized): Secondary | ICD-10-CM

## 2019-04-09 DIAGNOSIS — M546 Pain in thoracic spine: Secondary | ICD-10-CM

## 2019-04-09 DIAGNOSIS — G8929 Other chronic pain: Secondary | ICD-10-CM

## 2019-04-09 DIAGNOSIS — R262 Difficulty in walking, not elsewhere classified: Secondary | ICD-10-CM

## 2019-04-09 NOTE — Therapy (Signed)
[facility] [address] [place], [address] Phone: [phone]   Fax:  [phone]  Physical Therapy Treatment  Patient Details  Name: Sylvia Miller MRN: [id number] Date of Birth: 03-30-1940 Referring Provider (PT): Marlowe Sax, MD   Encounter Date: 04/09/2019  PT End of Session - 04/09/19 1301    Visit Number  [id number]    Number of Visits  38    Date for PT Re-Evaluation  04/17/19    Authorization Type  3    Authorization Time Period  of 10 progress report    PT Start Time  1302    PT Stop Time  1344    PT Time Calculation (min)  42 min    Activity Tolerance  Patient limited by fatigue    Behavior During Therapy  [facility] for tasks assessed/performed       Past Medical History:  Diagnosis Date  . Allergy   . Anemia   . Arthritis    rheumatoid /knees/hands/shoulders (Right shoulder) infusion every 5 weeks  . Bladder incontinence   . Chicken pox   . Dyspnea    with exertion or far walking  . Gastric ulcer   . GERD (gastroesophageal reflux disease)   . Heart murmur   . Hypercholesteremia   . Hyperlipidemia, unspecified   . Hypertension    controlled with meds;   Marland Kitchen MDS (myelodysplastic syndrome) ([place]) 2000  . Memory deficit   . Muscular dystrophy ([place])    per patient, this is incorrect  . Myelodysplastic syndrome ([place])   . Myelodysplastic syndrome ([place])   . Rheumatoid arthritis ([place])   . Ulcer     Past Surgical History:  Procedure Laterality Date  . ABDOMINAL HYSTERECTOMY    . APPENDECTOMY     as a child  . CHOLECYSTECTOMY    . COLONOSCOPY    . COLONOSCOPY WITH PROPOFOL N/A 04/24/2017   Procedure: COLONOSCOPY WITH PROPOFOL;  Surgeon: Lollie Sails, MD;  Location: [facility] ENDOSCOPY;  Service: Endoscopy;  Laterality: N/A;  . ESOPHAGOGASTRODUODENOSCOPY (EGD) WITH PROPOFOL N/A 04/24/2017   Procedure: ESOPHAGOGASTRODUODENOSCOPY (EGD) WITH PROPOFOL;  Surgeon: Lollie Sails, MD;   Location: [facility] ENDOSCOPY;  Service: Endoscopy;  Laterality: N/A;  . JOINT REPLACEMENT    . KNEE ARTHROPLASTY Right 02/21/2017   Procedure: COMPUTER ASSISTED TOTAL KNEE ARTHROPLASTY;  Surgeon: Dereck Leep, MD;  Location: [facility] ORS;  Service: Orthopedics;  Laterality: Right;  . KNEE ARTHROPLASTY Left 08/06/2017   Procedure: COMPUTER ASSISTED TOTAL KNEE ARTHROPLASTY;  Surgeon: Dereck Leep, MD;  Location: [facility] ORS;  Service: Orthopedics;  Laterality: Left;  . NM INTRAVENOUS INJECTION ([facility] HX)     receives biologic infusions every 5 weeks for rheumatoid arthritis. followed by dr. Meda Coffee at [place] [facility]  . TONSILLECTOMY    . UPPER GASTROINTESTINAL ENDOSCOPY    . WISDOM TOOTH EXTRACTION      There were no vitals filed for this visit.  Subjective Assessment - 04/09/19 1303    Subjective  Feels ok, on the exhausted side. L knee is kind of iffy. Back does not seem to be too much of a problem.    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 Pt had L TKA 08/05/2017. Last 3-4 months pt has been having pain L lateral leg. Pt also states feeling pain in her upper back  including her B shoulders. Mid back pain travels down her spine to her low back and feel radiating  pain down her L lateral leg. R LE seems fine.   Denies loss of bowel or bladder control, or saddle anesthesia.  No falls within the last 6 months.  Low back and L lateral leg pain seems to have increased since onset.     Patient Stated Goals  Walk more comfortably. Stand longer than a minute at church.    Currently in Pain?  Yes    Pain Score  --   no pain level provided for L knee                              PT Education - 04/09/19 1307    Education provided  Yes    Education Details  ther-ex    Person(s) Educated  Patient    Methods  Explanation;Demonstration;Tactile cues;Verbal cues    Comprehension  Returned demonstration;Verbalized understanding       Objective    Medbridge Access Code: Access Code: [email or web address]      Pt states L knee has been a problems since she was 79 years old    Therapeutic exercise  SpO2: 98% room air, 67 BPM  Sit <> stand from regular chair with arms 5x2    without UE assist .   Diaphragmatic breathing secondary to pt tendency to breath through her chest.   5x4  Difficult for pt to perform the motor pattern  Performed to help increase endurance with activities   LAQ 4 lbs L foot 10x3  Standing hip abduction with B UE assist   R 10x  L 10x   Forward step ups ontoAir Ex Padwith L LE andRUE assistto promote LE strength and stability L LE2x10              Lateral step up onto Air Ex pad with B UE assist  L 10x2   Seated bilateral scapular retraction to promote thoracic extension to decrease low back extension pressure 10x, then 7x5 seconds    Improved exercise technique, movement at target joints, use of target muscles after mod verbal, visual, tactile cues.   Manual therapy  Seated STM L hamstrings to decrease tension and posterior translation of tibia on femur Seated STM L lateral knee Seated STM L IT band     Pt response to treatment Therapeutic rest breaks provided secondary to fatigueand to allow muscle recovery for next exercise.Pt tolerated session well without aggravation of symptoms.   Clinical impression Continued working on L quadriceps strength and L knee stability to promote ability to ambulate with less L knee discomfort. Therapeutic rest breaks provided to allow for recovery for next exercise. Pt tolerated session well without aggravation of symptoms. Pt will benefit from continued skilled physical therapy services to decrease pain, improve strength, and ability to perform standing tasks as well as ambulate with less difficulty.      PT Short Term Goals - 08/08/18 1826      PT SHORT TERM GOAL #1   Title   Patient will be independent with her HEP to improve strength, decrease pain, improve function    Time  3    Period  Weeks    Status  On-going    Target Date  05/23/18        PT Long Term Goals - 03/26/19 1540      PT LONG TERM GOAL #1   Title  Patient will have a decrease in mid to low back pain to 5/10 or less  at worst to promote ability to ambulate more comfortably and tolerate standing longer.     Baseline  8/10 back pain at worst for the past 2 months (possible greater than 8/10 at worst based on pt reports but pt seems to have difficulty with the pain scale; 04/30/2018); 5-6/10 mid to low back pain at most for the past month (08/08/2018); 7/10 back pain at worst for the past 7 days (09/24/2018); 6-7/10 back pain at most for the past 7 days (11/05/2018), (12/24/2018), (02/06/2019); 4/10 at most for the past 7 days (03/26/2019)    Time  6    Period  Weeks    Status  On-going    Target Date  04/17/19      PT LONG TERM GOAL #2   Title  Pt will have a decrease in L lateral leg pain to 5/10 or less at worst  to promote ability to ambulate more comfortably and tolerate standing longer.     Baseline  10/10 at worst for the past 2 months (04/30/2018); 7-8/10 L lateral leg pain/tingling at worst for the past month (08/08/2018). 7/10 tingling at most for the past 7 days (09/24/2018); 6-7/10 at most for the past 7 days (11/05/2018), (12/24/2018); 8/10 at most for the past 7 days (02/06/2019); 4-5/10 (03/26/2019)    Time  6    Period  Weeks    Status  On-going      PT LONG TERM GOAL #3   Title  Patient will have a decrease in L knee joint pain to 5/10 or less at worst  to promote ability to ambulate more comfortably and tolerate standing longer.     Baseline  10/10 L knee pain at worst for the past 2 months (04/30/2018); 8/10 at worst for the past month (08/08/2018); 7-8/10 L knee pain at worst for the past 7 days (09/24/2018); 6-7/10 at worst for the past 7 days (11/05/2018), (12/24/2018); 6/10 at worst for the past  7 days (02/06/2019), 6-7/10 at most for the past 7 days (03/26/2019)    Time  6    Period  Weeks    Status  On-going    Target Date  04/17/19      PT LONG TERM GOAL #4   Title  Pt will improve B LE strength by at least 1/2 MMT grade  to promote ability to ambulate more comfortably and tolerate standing longer.     Time  6    Period  Weeks    Status  Partially Met      PT LONG TERM GOAL #5   Title  Pt will report being able to tolerate standing to 5 minutes or more to promote ability to stand at church as well as improve ability to perform chores.     Baseline  Pt able to tolerate standing 1 min per subjective reports. (04/30/2018); half a minute per pt (08/08/2018); 2 minutes 8 seconds with light touch assist (09/24/2018); able to stand 2 min and 50 seconds prior to sitting down (11/05/2018). Able to stand at least 6 minutes (12/2018)    Time  6    Period  Weeks    Status  Achieved      PT LONG TERM GOAL #6   Title  Patient will improve her TUG time using SPC to 15 seconds or less as a demonstration of improved functional mobility and balance.    Baseline  26.3 seconds on average using SPC (09/30/2018); 17 seconds average with SPC (11/05/2018); 17.48 seconds  without the [place] (12/24/2018); 17 seconds (02/04/2019); 19 seconds average without SPC, no LOB (03/20/2019)    Time  6    Period  Weeks    Status  Partially Met    Target Date  04/17/19      PT LONG TERM GOAL #7   Title  Patient will improve her Berg Balance Test score to 40/56 or more as a demonstration of improved balance and decreased fall risk.    Baseline  33/56 (09/30/2018); 43/56 (11/05/2018), (12/24/18); 45/56 (02/06/2019)    Time  6    Period  Weeks    Status  Achieved      PT LONG TERM GOAL #8   Title  Patient will improve her Berg Balance Test score to 47/56 or more as a demonstration of improved balance and decreased fall risk.    Baseline  45/56 (02/06/2019); 50/56 (03/20/2019)    Time  6    Period  Weeks    Status  Achieved             Plan - 04/09/19 1259    Clinical Impression Statement  Continued working on L quadriceps strength and L knee stability to promote ability to ambulate with less L knee discomfort. Therapeutic rest breaks provided to allow for recovery for next exercise. Pt tolerated session well without aggravation of symptoms. Pt will benefit from continued skilled physical therapy services to decrease pain, improve strength, and ability to perform standing tasks as well as ambulate with less difficulty.    Personal Factors and Comorbidities  Age;Comorbidity 3+;Fitness;Past/Current Experience;Time since onset of injury/illness/exacerbation    Comorbidities  Memory deficit, rheumatoid arthritis, dyspnea, arthritis, B TKA, hx of hysterectomy, myelodysplastic syndrome, arthritis multiple areas    Examination-Activity Limitations  Locomotion Level;Carry;Stairs;Stand    Stability/Clinical Decision Making  Stable/Uncomplicated    Rehab Potential  Fair    Clinical Impairments Affecting Rehab Potential  sedentary lifestyle, age, medical history and chronicity of condition.    PT Frequency  2x / week    PT Duration  4 weeks    PT Treatment/Interventions  Aquatic Therapy;Cryotherapy;Moist Heat;Gait training;Stair training;Functional mobility training;Therapeutic activities;Therapeutic exercise;Patient/family education;Neuromuscular re-education;Balance training;Manual techniques;Taping;Energy conservation;Passive range of motion;Electrical Stimulation;Iontophoresis 75m/ml Dexamethasone;Dry needling    PT Next Visit Plan  Continue with core strengthening, hip strenghtening.    PT Home Exercise Plan  [place]con[place] FallsAccess code: [email or web address]   Consulted and Agree with Plan of Care  Patient;Family member/caregiver    Family Member Consulted  husband/caregiver       Patient will benefit from skilled therapeutic intervention in order to improve the following deficits and impairments:  Abnormal gait, Pain, Postural  dysfunction, Decreased strength, Difficulty walking, Improper body mechanics, Decreased balance  Visit Diagnosis: Chronic pain of left knee  Difficulty in walking, not elsewhere classified  Chronic bilateral low back pain, unspecified whether sciatica present  Radiculopathy, lumbosacral region  Muscle weakness (generalized)  Pain in thoracic spine  Unsteadiness on feet     Problem List Patient Active Problem List   Diagnosis Date Noted  . Protein-calorie malnutrition ([place] 08/27/2017  . 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 Cubital canal compression syndrome, right 04/18/2017  . Primary osteoarthritis of knee 03/25/2017  . 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 Pure hypercholesterolemia 08/17/2016  . MDS (myelodysplastic syndrome) ([place] 05/12/2016  . B12 deficiency 05/04/2016  . Essential hypertension 05/04/2016  . Mixed stress and urge urinary incontinence 05/04/2016  . Rheumatoid arthritis involving multiple sites with positive rheumatoid  factor ([place]) 05/04/2016    Joneen Boers PT, DPT   04/09/2019, 6:57 PM  [facility] [address] [place], [address] Phone: [phone]   Fax:  [phone]  Name: Sylvia Miller MRN: [id number] Date of Birth: Nov 28, 1940

## 2019-04-14 ENCOUNTER — Other Ambulatory Visit: Payer: Self-pay

## 2019-04-14 ENCOUNTER — Ambulatory Visit: Payer: Medicare Other

## 2019-04-14 DIAGNOSIS — M6281 Muscle weakness (generalized): Secondary | ICD-10-CM

## 2019-04-14 DIAGNOSIS — G8929 Other chronic pain: Secondary | ICD-10-CM

## 2019-04-14 DIAGNOSIS — R2681 Unsteadiness on feet: Secondary | ICD-10-CM

## 2019-04-14 DIAGNOSIS — M25562 Pain in left knee: Secondary | ICD-10-CM | POA: Diagnosis not present

## 2019-04-14 DIAGNOSIS — M546 Pain in thoracic spine: Secondary | ICD-10-CM

## 2019-04-14 DIAGNOSIS — R262 Difficulty in walking, not elsewhere classified: Secondary | ICD-10-CM

## 2019-04-14 DIAGNOSIS — M5417 Radiculopathy, lumbosacral region: Secondary | ICD-10-CM

## 2019-04-14 NOTE — Therapy (Signed)
[facility] [address], [place], [address] Phone: [phone]   Fax:  [phone]  Physical Therapy Treatment  Patient Details  Name: Sylvia Miller MRN: [id number] Date of Birth: [birth year] Referring Provider (PT): Marlowe Sax, MD   Encounter Date: 04/14/2019  PT End of Session - 04/14/19 1305    Visit Number  48    Number of Visits  72    Date for PT Re-Evaluation  04/17/19    Authorization Type  4    Authorization Time Period  of 10 progress report    PT Start Time  1305    PT Stop Time  1345    PT Time Calculation (min)  40 min    Activity Tolerance  Patient limited by fatigue    Behavior During Therapy  [facility] for tasks assessed/performed       Past Medical History:  Diagnosis Date  . Allergy   . Anemia   . Arthritis    rheumatoid /knees/hands/shoulders (Right shoulder) infusion every 5 weeks  . Bladder incontinence   . Chicken pox   . Dyspnea    with exertion or far walking  . Gastric ulcer   . GERD (gastroesophageal reflux disease)   . Heart murmur   . Hypercholesteremia   . Hyperlipidemia, unspecified   . Hypertension    controlled with meds;   Marland Kitchen MDS (myelodysplastic syndrome) ([place]) 2000  . Memory deficit   . Muscular dystrophy ([place])    per patient, this is incorrect  . Myelodysplastic syndrome ([place])   . Myelodysplastic syndrome ([place])   . Rheumatoid arthritis ([place])   . Ulcer     Past Surgical History:  Procedure Laterality Date  . ABDOMINAL HYSTERECTOMY    . APPENDECTOMY     as a child  . CHOLECYSTECTOMY    . COLONOSCOPY    . COLONOSCOPY WITH PROPOFOL N/A 04/24/2017   Procedure: COLONOSCOPY WITH PROPOFOL;  Surgeon: Lollie Sails, MD;  Location: [facility] ENDOSCOPY;  Service: Endoscopy;  Laterality: N/A;  . ESOPHAGOGASTRODUODENOSCOPY (EGD) WITH PROPOFOL N/A 04/24/2017   Procedure: ESOPHAGOGASTRODUODENOSCOPY (EGD) WITH PROPOFOL;  Surgeon: Lollie Sails, MD;   Location: [facility] ENDOSCOPY;  Service: Endoscopy;  Laterality: N/A;  . JOINT REPLACEMENT    . KNEE ARTHROPLASTY Right 02/21/2017   Procedure: COMPUTER ASSISTED TOTAL KNEE ARTHROPLASTY;  Surgeon: Dereck Leep, MD;  Location: [facility] ORS;  Service: Orthopedics;  Laterality: Right;  . KNEE ARTHROPLASTY Left 08/06/2017   Procedure: COMPUTER ASSISTED TOTAL KNEE ARTHROPLASTY;  Surgeon: Dereck Leep, MD;  Location: [facility] ORS;  Service: Orthopedics;  Laterality: Left;  . NM INTRAVENOUS INJECTION ([facility] HX)     receives biologic infusions every 5 weeks for rheumatoid arthritis. followed by dr. Meda Coffee at [place] [facility]  . TONSILLECTOMY    . UPPER GASTROINTESTINAL ENDOSCOPY    . WISDOM TOOTH EXTRACTION      There were no vitals filed for this visit.  Subjective Assessment - 04/14/19 1306    Subjective  Back seems to be ok. L knee is a little iffy. 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 Pt had L TKA 08/05/2017. Last 3-4 months pt has been having pain L lateral leg. Pt also states feeling pain in her upper back  including her B shoulders. Mid back pain travels down her spine to her low back and feel radiating pain down her L lateral leg. R LE  seems fine.   Denies loss of bowel or bladder control, or saddle anesthesia.  No falls within the last 6 months.  Low back and L lateral leg pain seems to have increased since onset.     Patient Stated Goals  Walk more comfortably. Stand longer than a minute at church.    Currently in Pain?  Yes    Pain Score  7    L knee                              PT Education - 04/14/19 1309    Education provided  Yes    Education Details  ther-ex    [employer]) Educated  Patient    Methods  Explanation;Demonstration;Tactile cues;Verbal cues    Comprehension  Returned demonstration;Verbalized understanding        Objective   Medbridge Access Code: Access Code: [email or web address]      Pt  states L knee has been a problems since she was 79 years old    Therapeutic exercise  Sit <> stand from regular chair with arms 5x3without UE assist .   Standing hip abduction with B UE assist              R 5x4             L 5x4  LAQ 4 lbs L foot 10x3   Forward step ups ontoAir Ex Padwith L LE andRUE assistto promote LE strength and stability L LE2x10  Lateral step up onto Air Ex pad with B UE assist  L 10x2  Seated bilateral scapular retraction to promote thoracic extension to decrease low back extension pressure10x2 for 5 seconds   Forward step up onto 4 inch step with 1 riser with L LE and B UE assist 10x (upgrade)   seated hip adduction ball and glute max squeeze 10x5 seconds     Improved exercise technique, movement at target joints, use of target muscles after mod verbal, visual, tactile cues.        Pt response to treatment Therapeutic rest breaks provided secondary to fatigueand to allow muscle recovery for next exercise.Pt tolerated session well without aggravation of symptoms.   Clinical impression Continued working on improving L quadriceps strength especially in closed chain positions to promote stability. Able to perform all exercises only this session for at least 38 minutes with therapeutic rest breaks as needed for recover for next exercise to promote endurance. Pt tolerated session well without aggravation of symptoms. Fatigue at end of session. Pt will benefit from continued skilled physical therapy services to decrease pain, improve strength, function, and decrease difficulty with gait.          PT Short Term Goals - 08/08/18 1826      PT SHORT TERM GOAL #1   Title  Patient will be independent with her HEP to improve strength, decrease pain, improve function    Time  3    Period  Weeks    Status  On-going    Target Date  05/23/18        PT Long Term Goals -  03/26/19 1540      PT LONG TERM GOAL #1   Title  Patient will have a decrease in mid to low back pain to 5/10 or less at worst to promote ability to ambulate more comfortably and tolerate standing longer.     Baseline  8/10 back pain at worst for the  past 2 months (possible greater than 8/10 at worst based on pt reports but pt seems to have difficulty with the pain scale; 04/30/2018); 5-6/10 mid to low back pain at most for the past month (08/08/2018); 7/10 back pain at worst for the past 7 days (09/24/2018); 6-7/10 back pain at most for the past 7 days (11/05/2018), (12/24/2018), (02/06/2019); 4/10 at most for the past 7 days (03/26/2019)    Time  6    Period  Weeks    Status  On-going    Target Date  04/17/19      PT LONG TERM GOAL #2   Title  Pt will have a decrease in L lateral leg pain to 5/10 or less at worst  to promote ability to ambulate more comfortably and tolerate standing longer.     Baseline  10/10 at worst for the past 2 months (04/30/2018); 7-8/10 L lateral leg pain/tingling at worst for the past month (08/08/2018). 7/10 tingling at most for the past 7 days (09/24/2018); 6-7/10 at most for the past 7 days (11/05/2018), (12/24/2018); 8/10 at most for the past 7 days (02/06/2019); 4-5/10 (03/26/2019)    Time  6    Period  Weeks    Status  On-going      PT LONG TERM GOAL #3   Title  Patient will have a decrease in L knee joint pain to 5/10 or less at worst  to promote ability to ambulate more comfortably and tolerate standing longer.     Baseline  10/10 L knee pain at worst for the past 2 months (04/30/2018); 8/10 at worst for the past month (08/08/2018); 7-8/10 L knee pain at worst for the past 7 days (09/24/2018); 6-7/10 at worst for the past 7 days (11/05/2018), (12/24/2018); 6/10 at worst for the past 7 days (02/06/2019), 6-7/10 at most for the past 7 days (03/26/2019)    Time  6    Period  Weeks    Status  On-going    Target Date  04/17/19      PT LONG TERM GOAL #4   Title  Pt will improve B LE  strength by at least 1/2 MMT grade  to promote ability to ambulate more comfortably and tolerate standing longer.     Time  6    Period  Weeks    Status  Partially Met      PT LONG TERM GOAL #5   Title  Pt will report being able to tolerate standing to 5 minutes or more to promote ability to stand at church as well as improve ability to perform chores.     Baseline  Pt able to tolerate standing 1 min per subjective reports. (04/30/2018); half a minute per pt (08/08/2018); 2 minutes 8 seconds with light touch assist (09/24/2018); able to stand 2 min and 50 seconds prior to sitting down (11/05/2018). Able to stand at least 6 minutes (12/2018)    Time  6    Period  Weeks    Status  Achieved      PT LONG TERM GOAL #6   Title  Patient will improve her TUG time using SPC to 15 seconds or less as a demonstration of improved functional mobility and balance.    Baseline  26.3 seconds on average using SPC (09/30/2018); 17 seconds average with SPC (11/05/2018); 17.48 seconds without the SPC (12/24/2018); 17 seconds (02/04/2019); 19 seconds average without SPC, no LOB (03/20/2019)    Time  6    Period  Weeks    Status  Partially Met    Target Date  04/17/19      PT LONG TERM GOAL #7   Title  Patient will improve her Merrilee Jansky Balance Test score to 40/56 or more as a demonstration of improved balance and decreased fall risk.    Baseline  33/56 (09/30/2018); 43/56 (11/05/2018), (12/24/18); 45/56 (02/06/2019)    Time  6    Period  Weeks    Status  Achieved      PT LONG TERM GOAL #8   Title  Patient will improve her Berg Balance Test score to 47/56 or more as a demonstration of improved balance and decreased fall risk.    Baseline  45/56 (02/06/2019); 50/56 (03/20/2019)    Time  6    Period  Weeks    Status  Achieved            Plan - 04/14/19 1303    Clinical Impression Statement  Continued working on improving L quadriceps strength especially in closed chain positions to promote stability. Able to perform all  exercises only this session for at least 38 minutes with therapeutic rest breaks as needed for recover for next exercise to promote endurance. Pt tolerated session well without aggravation of symptoms. Fatigue at end of session. Pt will benefit from continued skilled physical therapy services to decrease pain, improve strength, function, and decrease difficulty with gait.    Personal Factors and Comorbidities  Age;Comorbidity 3+;Fitness;Past/Current Experience;Time since onset of injury/illness/exacerbation    Comorbidities  Memory deficit, rheumatoid arthritis, dyspnea, arthritis, B TKA, hx of hysterectomy, myelodysplastic syndrome, arthritis multiple areas    Examination-Activity Limitations  Locomotion Level;Carry;Stairs;Stand    Stability/Clinical Decision Making  Stable/Uncomplicated    Rehab Potential  Fair    Clinical Impairments Affecting Rehab Potential  sedentary lifestyle, age, medical history and chronicity of condition.    PT Frequency  2x / week    PT Duration  4 weeks    PT Treatment/Interventions  Aquatic Therapy;Cryotherapy;Moist Heat;Gait training;Stair training;Functional mobility training;Therapeutic activities;Therapeutic exercise;Patient/family education;Neuromuscular re-education;Balance training;Manual techniques;Taping;Energy conservation;Passive range of motion;Electrical Stimulation;Iontophoresis 52m/ml Dexamethasone;Dry needling    PT Next Visit Plan  Continue with core strengthening, hip strenghtening.    PT Home Exercise Plan  MRavalliAccess code: [email or web address]   Consulted and Agree with Plan of Care  Patient;Family member/caregiver    Family Member Consulted  husband/caregiver       Patient will benefit from skilled therapeutic intervention in order to improve the following deficits and impairments:  Abnormal gait, Pain, Postural dysfunction, Decreased strength, Difficulty walking, Improper body mechanics, Decreased balance  Visit Diagnosis: Chronic pain of left  knee  Difficulty in walking, not elsewhere classified  Chronic bilateral low back pain, unspecified whether sciatica present  Radiculopathy, lumbosacral region  Muscle weakness (generalized)  Pain in thoracic spine  Unsteadiness on feet     Problem List Patient Active Problem List   Diagnosis Date Noted  . Protein-calorie malnutrition ([place] 08/27/2017  . 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 Cubital canal compression syndrome, right 04/18/2017  . Primary osteoarthritis of knee 03/25/2017  . 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 Pure hypercholesterolemia 08/17/2016  . MDS (myelodysplastic syndrome) ([place] 05/12/2016  . B12 deficiency 05/04/2016  . Essential hypertension 05/04/2016  . Mixed stress and urge urinary incontinence 05/04/2016  . Rheumatoid arthritis involving multiple sites with positive rheumatoid factor ([place] 05/04/2016    MJoneen BoersPT, DPT   04/14/2019, 2:00 PM  [facility] [address], [place], [address] Phone: [phone]   Fax:  [phone]  Name: Sylvia Miller MRN: [id number] Date of Birth: 28-Dec-1940

## 2019-04-16 ENCOUNTER — Ambulatory Visit: Payer: Medicare Other

## 2019-04-16 ENCOUNTER — Other Ambulatory Visit: Payer: Self-pay

## 2019-04-16 DIAGNOSIS — R262 Difficulty in walking, not elsewhere classified: Secondary | ICD-10-CM

## 2019-04-16 DIAGNOSIS — M546 Pain in thoracic spine: Secondary | ICD-10-CM

## 2019-04-16 DIAGNOSIS — R2681 Unsteadiness on feet: Secondary | ICD-10-CM

## 2019-04-16 DIAGNOSIS — M25562 Pain in left knee: Secondary | ICD-10-CM | POA: Diagnosis not present

## 2019-04-16 DIAGNOSIS — M6281 Muscle weakness (generalized): Secondary | ICD-10-CM

## 2019-04-16 DIAGNOSIS — G8929 Other chronic pain: Secondary | ICD-10-CM

## 2019-04-16 DIAGNOSIS — M5417 Radiculopathy, lumbosacral region: Secondary | ICD-10-CM

## 2019-04-16 DIAGNOSIS — M545 Low back pain, unspecified: Secondary | ICD-10-CM

## 2019-04-16 NOTE — Therapy (Signed)
[facility] [address] [place], [address] Phone: [phone]   Fax:  [phone]  Physical Therapy Treatment And Discharge Summary  Patient Details  Name: Sylvia Miller MRN: [id number] Date of Birth: 1940-11-05 Referring Provider (PT): Marlowe Sax, MD   Encounter Date: 04/16/2019  PT End of Session - 04/16/19 1400    Visit Number  65    Number of Visits  24    Date for PT Re-Evaluation  04/17/19    Authorization Type  5    Authorization Time Period  of 10 progress report    PT Start Time  1400   pt arrived late   PT Stop Time  1430    PT Time Calculation (min)  30 min    Activity Tolerance  Patient limited by fatigue    Behavior During Therapy  [facility] for tasks assessed/performed       Past Medical History:  Diagnosis Date  . Allergy   . Anemia   . Arthritis    rheumatoid /knees/hands/shoulders (Right shoulder) infusion every 5 weeks  . Bladder incontinence   . Chicken pox   . Dyspnea    with exertion or far walking  . Gastric ulcer   . GERD (gastroesophageal reflux disease)   . Heart murmur   . Hypercholesteremia   . Hyperlipidemia, unspecified   . Hypertension    controlled with meds;   Marland Kitchen MDS (myelodysplastic syndrome) ([place]) 2000  . Memory deficit   . Muscular dystrophy ([place])    per patient, this is incorrect  . Myelodysplastic syndrome ([place])   . Myelodysplastic syndrome ([place])   . Rheumatoid arthritis ([place])   . Ulcer     Past Surgical History:  Procedure Laterality Date  . ABDOMINAL HYSTERECTOMY    . APPENDECTOMY     as a child  . CHOLECYSTECTOMY    . COLONOSCOPY    . COLONOSCOPY WITH PROPOFOL N/A 04/24/2017   Procedure: COLONOSCOPY WITH PROPOFOL;  Surgeon: Lollie Sails, MD;  Location: [facility] ENDOSCOPY;  Service: Endoscopy;  Laterality: N/A;  . ESOPHAGOGASTRODUODENOSCOPY (EGD) WITH PROPOFOL N/A 04/24/2017   Procedure: ESOPHAGOGASTRODUODENOSCOPY (EGD) WITH  PROPOFOL;  Surgeon: Lollie Sails, MD;  Location: [facility] ENDOSCOPY;  Service: Endoscopy;  Laterality: N/A;  . JOINT REPLACEMENT    . KNEE ARTHROPLASTY Right 02/21/2017   Procedure: COMPUTER ASSISTED TOTAL KNEE ARTHROPLASTY;  Surgeon: Dereck Leep, MD;  Location: [facility] ORS;  Service: Orthopedics;  Laterality: Right;  . KNEE ARTHROPLASTY Left 08/06/2017   Procedure: COMPUTER ASSISTED TOTAL KNEE ARTHROPLASTY;  Surgeon: Dereck Leep, MD;  Location: [facility] ORS;  Service: Orthopedics;  Laterality: Left;  . NM INTRAVENOUS INJECTION ([facility] HX)     receives biologic infusions every 5 weeks for rheumatoid arthritis. followed by dr. Meda Coffee at [facility]  . TONSILLECTOMY    . UPPER GASTROINTESTINAL ENDOSCOPY    . WISDOM TOOTH EXTRACTION      There were no vitals filed for this visit.  Subjective Assessment - 04/16/19 1401    Subjective  L knee hurts somewhat. 7/10 L knee pain currently, and at most for the past 7 days. 0/10 back, 6-7/10 back and L lateral leg pain at most for the past 7 days.    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 Pt had L TKA 08/05/2017. Last 3-4 months pt has been having pain L lateral leg. Pt also states feeling pain in her upper back  including  her B shoulders. Mid back pain travels down her spine to her low back and feel radiating pain down her L lateral leg. R LE seems fine.   Denies loss of bowel or bladder control, or saddle anesthesia.  No falls within the last 6 months.  Low back and L lateral leg pain seems to have increased since onset.     Patient Stated Goals  Walk more comfortably. Stand longer than a minute at church.    Currently in Pain?  Yes    Pain Score  7    L knee        [facility] PT Assessment - 04/16/19 0001      Strength   Right Hip Flexion  4+/5    Right Hip Extension  4/5   seated manually resisted   Right Hip ABduction  5/5   seated manually resisted clamshell isometrics   Left Hip Flexion  5/5    Left Hip  Extension  4+/5   seated manually resisted   Left Hip ABduction  5/5   seated manually resisted clamshell isometrics   Right Knee Flexion  4+/5    Right Knee Extension  5/5    Left Knee Flexion  4+/5    Left Knee Extension  5/5                           PT Education - 04/16/19 1404    Education provided  Yes    Education Details  ther-ex    Person(s) Educated  Patient    Methods  Explanation;Demonstration;Tactile cues;Verbal cues    Comprehension  Returned demonstration;Verbalized understanding        Objective   Medbridge Access Code: Access Code: [email or web address]      Pt states L knee has been a problems since she was 79 years old    Therapeutic exercise  Sit <> stand from regular chair with arms 10xwithout UE assist .   Standing hip abduction with B UE assist  R 10x L 10x  Seated manually resisted hip flexion, extension, clamshell isometric, knee flexion, knee extension 1-2x each way for each LE    Standing up from a chair, walking 10 ft forward, then returning 10 ft, then sitting back onto chair 3x  No SPC 17 seconds, 17 seconds (17 seconds average)   Improved exercise technique, movement at target joints, use of target muscles after mod verbal, visual, tactile cues.       Pt response to treatment Therapeutic rest breaks provided secondary to fatigueand to allow muscle recovery for next exercise.Pt tolerated session well without aggravation of symptoms.   Clinical impression Pt arrived late so session adjust accordingly. Pt demonstrates overall improved bilateral hip and knee strength, and balance since initial evaluation with improved TUG times and Berg Balance scores without use of AD suggesting decreased fall risk. Pt demonstrates some overall decrease in low back, L knee and lateral leg pain since initial evaluation but max improvement at this point in time seems to have been achieved in  the clinic in which pain levels seem to plateau around 6-7/10. Pt has made very good progress with PT towards strength and function. Skilled physical therapy services discharged with patient continuing progress with her exercises at home.    PT Short Term Goals - 08/08/18 1826      PT SHORT TERM GOAL #1   Title  Patient will be independent with her HEP to improve strength, decrease pain,  improve function    Time  3    Period  Weeks    Status  On-going    Target Date  05/23/18        PT Long Term Goals - 04/16/19 0001      PT LONG TERM GOAL #1   Title  Patient will have a decrease in mid to low back pain to 5/10 or less at worst to promote ability to ambulate more comfortably and tolerate standing longer.     Baseline  8/10 back pain at worst for the past 2 months (possible greater than 8/10 at worst based on pt reports but pt seems to have difficulty with the pain scale; 04/30/2018); 5-6/10 mid to low back pain at most for the past month (08/08/2018); 7/10 back pain at worst for the past 7 days (09/24/2018); 6-7/10 back pain at most for the past 7 days (11/05/2018), (12/24/2018), (02/06/2019); 4/10 at most for the past 7 days (03/26/2019); 6-7/10 at most for the past 7 days (04/16/2019)    Time  6    Period  Weeks    Status  On-going    Target Date  04/17/19      PT LONG TERM GOAL #2   Title  Pt will have a decrease in L lateral leg pain to 5/10 or less at worst  to promote ability to ambulate more comfortably and tolerate standing longer.     Baseline  10/10 at worst for the past 2 months (04/30/2018); 7-8/10 L lateral leg pain/tingling at worst for the past month (08/08/2018). 7/10 tingling at most for the past 7 days (09/24/2018); 6-7/10 at most for the past 7 days (11/05/2018), (12/24/2018); 8/10 at most for the past 7 days (02/06/2019); 4-5/10 (03/26/2019); 6-7/10 at most for the past 7 days (04/16/2019)    Time  6    Period  Weeks    Status  On-going    Target Date  04/17/19      PT LONG TERM  GOAL #3   Title  Patient will have a decrease in L knee joint pain to 5/10 or less at worst  to promote ability to ambulate more comfortably and tolerate standing longer.     Baseline  10/10 L knee pain at worst for the past 2 months (04/30/2018); 8/10 at worst for the past month (08/08/2018); 7-8/10 L knee pain at worst for the past 7 days (09/24/2018); 6-7/10 at worst for the past 7 days (11/05/2018), (12/24/2018); 6/10 at worst for the past 7 days (02/06/2019), 6-7/10 at most for the past 7 days (03/26/2019); 7/10 L knee pain at most for the past 7 days (04/16/2019)    Time  6    Period  Weeks    Status  On-going    Target Date  04/17/19      PT LONG TERM GOAL #4   Title  Pt will improve B LE strength by at least 1/2 MMT grade  to promote ability to ambulate more comfortably and tolerate standing longer.     Time  6    Period  Weeks    Status  Achieved    Target Date  04/17/19      PT LONG TERM GOAL #5   Title  Pt will report being able to tolerate standing to 5 minutes or more to promote ability to stand at church as well as improve ability to perform chores.     Baseline  Pt able to tolerate standing 1 min per  subjective reports. (04/30/2018); half a minute per pt (08/08/2018); 2 minutes 8 seconds with light touch assist (09/24/2018); able to stand 2 min and 50 seconds prior to sitting down (11/05/2018). Able to stand at least 6 minutes (12/2018)    Time  6    Period  Weeks    Status  Achieved      PT LONG TERM GOAL #6   Title  Patient will improve her TUG time using SPC to 15 seconds or less as a demonstration of improved functional mobility and balance.    Baseline  26.3 seconds on average using SPC (09/30/2018); 17 seconds average with SPC (11/05/2018); 17.48 seconds without the SPC (12/24/2018); 17 seconds (02/04/2019); 19 seconds average without SPC, no LOB (03/20/2019); 17 seconds without AD (04/16/2019)    Time  6    Period  Weeks    Status  Partially Met    Target Date  04/17/19      PT LONG  TERM GOAL #7   Title  Patient will improve her Berg Balance Test score to 40/56 or more as a demonstration of improved balance and decreased fall risk.    Baseline  33/56 (09/30/2018); 43/56 (11/05/2018), (12/24/18); 45/56 (02/06/2019)    Time  6    Period  Weeks    Status  Achieved      PT LONG TERM GOAL #8   Title  Patient will improve her Berg Balance Test score to 47/56 or more as a demonstration of improved balance and decreased fall risk.    Baseline  45/56 (02/06/2019); 50/56 (03/20/2019)    Time  6    Period  Weeks    Status  Achieved            Plan - 04/16/19 1405    Clinical Impression Statement  Pt arrived late so session adjust accordingly. Pt demonstrates overall improved bilateral hip and knee strength, and balance since initial evaluation with improved TUG times and Berg Balance scores without use of AD suggesting decreased fall risk. Pt demonstrates some overall decrease in low back, L knee and lateral leg pain since initial evaluation but max improvement at this point in time seems to have been achieved in the clinic in which pain levels seem to plateau around 6-7/10. Pt has made very good progress with PT towards strength and function. Skilled physical therapy services discharged with patient continuing progress with her exercises at home.    Personal Factors and Comorbidities  Age;Comorbidity 3+;Fitness;Past/Current Experience;Time since onset of injury/illness/exacerbation    Comorbidities  Memory deficit, rheumatoid arthritis, dyspnea, arthritis, B TKA, hx of hysterectomy, myelodysplastic syndrome, arthritis multiple areas    Examination-Activity Limitations  Locomotion Level;Carry;Stairs;Stand    Stability/Clinical Decision Making  Stable/Uncomplicated    Clinical Decision Making  Low    Clinical Presentation due to:  progress with PT toward goals    Rehab Potential  Fair    Clinical Impairments Affecting Rehab Potential  sedentary lifestyle, age, medical history and  chronicity of condition.    PT Frequency  2x / week    PT Duration  4 weeks    PT Treatment/Interventions  Aquatic Therapy;Cryotherapy;Moist Heat;Gait training;Stair training;Functional mobility training;Therapeutic activities;Therapeutic exercise;Patient/family education;Neuromuscular re-education;Balance training;Manual techniques;Taping;Energy conservation;Passive range of motion;Electrical Stimulation;Iontophoresis 79m/ml Dexamethasone;Dry needling    PT Next Visit Plan  Continue with core strengthening, hip strenghtening.    PT Home Exercise Plan  MNorth Valley StreamAccess code: [email or web address]   Consulted and Agree with Plan of Care  Patient;Family member/caregiver  Family Member Consulted  husband/caregiver       Patient will benefit from skilled therapeutic intervention in order to improve the following deficits and impairments:  Abnormal gait, Pain, Postural dysfunction, Decreased strength, Difficulty walking, Improper body mechanics, Decreased balance  Visit Diagnosis: Chronic pain of left knee  Difficulty in walking, not elsewhere classified  Chronic bilateral low back pain, unspecified whether sciatica present  Radiculopathy, lumbosacral region  Muscle weakness (generalized)  Pain in thoracic spine  Unsteadiness on feet     Problem List Patient Active Problem List   Diagnosis Date Noted  . Protein-calorie malnutrition ([place]) 08/27/2017  . 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 Cubital canal compression syndrome, right 04/18/2017  . Primary osteoarthritis of knee 03/25/2017  . 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 Pure hypercholesterolemia 08/17/2016  . MDS (myelodysplastic syndrome) ([place]) 05/12/2016  . B12 deficiency 05/04/2016  . Essential hypertension 05/04/2016  . Mixed stress and urge urinary incontinence 05/04/2016  . Rheumatoid arthritis involving multiple sites with positive rheumatoid factor ([place]) 05/04/2016     Thank you for your  referral.   Joneen Boers PT, DPT   04/16/2019, 3:03 PM  [facility] [address] [place], [address] Phone: [phone]   Fax:  [phone]  Name: Sylvia Miller MRN: [id number] Date of Birth: [birth year]

## 2019-05-24 ENCOUNTER — Other Ambulatory Visit: Payer: Self-pay

## 2019-05-24 ENCOUNTER — Emergency Department: Payer: Medicare Other

## 2019-05-24 ENCOUNTER — Emergency Department
Admission: EM | Admit: 2019-05-24 | Discharge: 2019-05-24 | Disposition: A | Discharge status: Home / Self Care | Payer: Medicare Other | Attending: Student in an Organized Health Care Education/Training Program | Admitting: Student in an Organized Health Care Education/Training Program

## 2019-05-24 ENCOUNTER — Encounter: Payer: Self-pay | Admitting: Radiology

## 2019-05-24 DIAGNOSIS — Z96651 Presence of right artificial knee joint: Secondary | ICD-10-CM | POA: Diagnosis not present

## 2019-05-24 DIAGNOSIS — Z79899 Other long term (current) drug therapy: Secondary | ICD-10-CM | POA: Diagnosis not present

## 2019-05-24 DIAGNOSIS — R0602 Shortness of breath: Secondary | ICD-10-CM | POA: Diagnosis present

## 2019-05-24 DIAGNOSIS — Z7982 Long term (current) use of aspirin: Secondary | ICD-10-CM | POA: Insufficient documentation

## 2019-05-24 DIAGNOSIS — I10 Essential (primary) hypertension: Secondary | ICD-10-CM | POA: Insufficient documentation

## 2019-05-24 DIAGNOSIS — Z20822 Contact with and (suspected) exposure to covid-19: Secondary | ICD-10-CM | POA: Insufficient documentation

## 2019-05-24 LAB — BASIC METABOLIC PANEL
Anion gap: 10 (ref 5–15)
BUN: 26 mg/dL — ABNORMAL HIGH (ref 8–23)
CO2: 21 mmol/L — ABNORMAL LOW (ref 22–32)
Calcium: 9.3 mg/dL (ref 8.9–10.3)
Chloride: 106 mmol/L (ref 98–111)
Creatinine, Ser: 0.97 mg/dL (ref 0.44–1.00)
GFR calc Af Amer: >60 mL/min (ref >60)
GFR calc non Af Amer: 56 mL/min — ABNORMAL LOW (ref >60)
Glucose, Bld: 97 mg/dL (ref 70–99)
Potassium: 3.8 mmol/L (ref 3.5–5.1)
Sodium: 137 mmol/L (ref 135–145)

## 2019-05-24 LAB — CBC
HCT: 40.3 % (ref 36.0–46.0)
Hemoglobin: 13.2 g/dL (ref 12.0–15.0)
MCH: 34.4 pg — ABNORMAL HIGH (ref 26.0–34.0)
MCHC: 32.8 g/dL (ref 30.0–36.0)
MCV: 104.9 fL — ABNORMAL HIGH (ref 80.0–100.0)
Platelets: 162 10*3/uL (ref 150–400)
RBC: 3.84 MIL/uL — ABNORMAL LOW (ref 3.87–5.11)
RDW: 12.4 % (ref 11.5–15.5)
WBC: 6.4 10*3/uL (ref 4.0–10.5)
nRBC: 0 % (ref 0.0–0.2)

## 2019-05-24 LAB — TROPONIN I (HIGH SENSITIVITY)
Troponin I (High Sensitivity): 7 ng/L (ref <18)
Troponin I (High Sensitivity): 6 ng/L (ref <18)

## 2019-05-24 LAB — POC SARS CORONAVIRUS 2 AG: SARS Coronavirus 2 Ag: NEGATIVE

## 2019-05-24 LAB — BRAIN NATRIURETIC PEPTIDE: B Natriuretic Peptide: 81 pg/mL (ref 0.0–100.0)

## 2019-05-24 MED ORDER — LORAZEPAM 0.5 MG PO TABS
0.5000 mg | ORAL_TABLET | Freq: Once | ORAL | Status: AC
  Administered 2019-05-24: 16:00:00 0.5 mg via ORAL
  Filled 2019-05-24: qty 1

## 2019-05-24 MED ORDER — IPRATROPIUM-ALBUTEROL 0.5-2.5 (3) MG/3ML IN SOLN
3.0000 mL | Freq: Once | RESPIRATORY_TRACT | Status: DC
  Filled 2019-05-24: qty 3

## 2019-05-24 MED ORDER — PREDNISONE 20 MG PO TABS: 20.0000 mg | ORAL_TABLET | Freq: Every day | ORAL | 0 refills | Status: AC

## 2019-05-24 MED ORDER — ALBUTEROL SULFATE HFA 108 (90 BASE) MCG/ACT IN AERS: 2.0000 | INHALATION_SPRAY | Freq: Four times a day (QID) | RESPIRATORY_TRACT | 0 refills | Status: DC | PRN

## 2019-05-24 MED ORDER — IPRATROPIUM-ALBUTEROL 0.5-2.5 (3) MG/3ML IN SOLN
3.0000 mL | Freq: Once | RESPIRATORY_TRACT | Status: AC
  Administered 2019-05-24: 3 mL via RESPIRATORY_TRACT

## 2019-05-24 MED ORDER — IPRATROPIUM-ALBUTEROL 0.5-2.5 (3) MG/3ML IN SOLN
3.0000 mL | Freq: Once | RESPIRATORY_TRACT | Status: AC
  Administered 2019-05-24: 3 mL via RESPIRATORY_TRACT
  Filled 2019-05-24: qty 3

## 2019-05-24 MED ORDER — DEXAMETHASONE SODIUM PHOSPHATE 10 MG/ML IJ SOLN
10.0000 mg | Freq: Once | INTRAMUSCULAR | Status: AC
  Administered 2019-05-24: 10 mg via INTRAVENOUS
  Filled 2019-05-24: qty 1

## 2019-05-24 MED ORDER — IOHEXOL 350 MG/ML SOLN
75.0000 mL | Freq: Once | INTRAVENOUS | Status: AC | PRN
  Administered 2019-05-24: 16:00:00 75 mL via INTRAVENOUS

## 2019-05-24 NOTE — ED Notes (Signed)
Pt feels better after medications.

## 2019-05-24 NOTE — ED Notes (Signed)
Peripheral IV discontinued. Catheter intact. No signs of infiltration or redness. Gauze applied to IV site.    Discharge instructions reviewed with patient. Questions fielded by this RN. Patient verbalizes understanding of instructions. Patient discharged home in stable condition per robinson. No acute distress noted at time of discharge.   Pt wheeled to ED front and loaded in vehicle, sig other has DC.  Inhaler use and effects discussed

## 2019-05-24 NOTE — ED Triage Notes (Addendum)
Pt arrived via POV with husband with reports of shortness of breath that started today, states she thinks when she woke up she was short of breath, pt states she is short of breath at rest and with exertion.  Pt c/o having difficulty catching her breath but denies any pain at this time.

## 2019-05-24 NOTE — ED Notes (Addendum)
Pt ambulated in room.  HR 110.  Pt reports she felt ok after walking.  NAD. sats remained 100% RA

## 2019-05-24 NOTE — ED Notes (Signed)
Husband out to get RN reporting patient cannot breathe. sats remain high 90s.  No retractions. Pt appears to be having increased shallow breaths, similar to a mild hyperventilation.  Pt reports she has claustrophobia.  Appears anxious.  Door left open so patient can see out.

## 2019-05-24 NOTE — ED Notes (Signed)
Transported to CT 

## 2019-05-24 NOTE — ED Provider Notes (Signed)
[facility] Emergency Department Provider Note    First MD Initiated Contact with Patient 05/24/19 1504     (approximate)  I have reviewed the triage vital signs and the nursing notes.   HISTORY  Chief Complaint Shortness of Breath    HPI Sylvia Miller is a 79 y.o. female with the below listed past medical history presents the ER for 24 hours of shortness of breath.  Patient arrives speaking in complete sentences.  Does appear mildly tachypneic and this was noted by her family who brought her to the ER.  She denies any pain.  No lower extremity swelling.  No fevers no cough.  She does not smoke.  Does not wear home oxygen.  Denies any history of asthma or COPD.  Denies any abdominal pain.  States that she has been feeling stressed out and anxious.  Husband states that she was saying that she was feeling like she was going to "crawl out of her skin "a few moments prior to me entering the room.    Past Medical History:  Diagnosis Date  . Allergy   . Anemia   . Arthritis    rheumatoid /knees/hands/shoulders (Right shoulder) infusion every 5 weeks  . Bladder incontinence   . Chicken pox   . Dyspnea    with exertion or far walking  . Gastric ulcer   . GERD (gastroesophageal reflux disease)   . Heart murmur   . Hypercholesteremia   . Hyperlipidemia, unspecified   . Hypertension    controlled with meds;   Marland Kitchen MDS (myelodysplastic syndrome) ([place]) 2000  . Memory deficit   . Muscular dystrophy ([place])    per patient, this is incorrect  . Myelodysplastic syndrome ([place])   . Myelodysplastic syndrome ([place])   . Rheumatoid arthritis ([place])   . Ulcer    Family History  Problem Relation Age of Onset  . Cancer Mother   . Lung cancer Mother   . Lung cancer Father   . Colon cancer Neg Hx   . Colon polyps Neg Hx   . Rectal cancer Neg Hx    Past Surgical History:  Procedure Laterality Date  . ABDOMINAL HYSTERECTOMY    . APPENDECTOMY     as a child  .  CHOLECYSTECTOMY    . COLONOSCOPY    . COLONOSCOPY WITH PROPOFOL N/A 04/24/2017   Procedure: COLONOSCOPY WITH PROPOFOL;  Surgeon: Lollie Sails, MD;  Location: [facility] ENDOSCOPY;  Service: Endoscopy;  Laterality: N/A;  . ESOPHAGOGASTRODUODENOSCOPY (EGD) WITH PROPOFOL N/A 04/24/2017   Procedure: ESOPHAGOGASTRODUODENOSCOPY (EGD) WITH PROPOFOL;  Surgeon: Lollie Sails, MD;  Location[facility] ENDOSCOPY;  Service: Endoscopy;  Laterality: N/A;  . JOINT REPLACEMENT    . KNEE ARTHROPLASTY Right 02/21/2017   Procedure: COMPUTER ASSISTED TOTAL KNEE ARTHROPLASTY;  Surgeon: Dereck Leep, MD;  Location: [facility] ORS;  Service: Orthopedics;  Laterality: Right;  . KNEE ARTHROPLASTY Left 08/06/2017   Procedure: COMPUTER ASSISTED TOTAL KNEE ARTHROPLASTY;  Surgeon: Dereck Leep, MD;  Location: [facility] ORS;  Service: Orthopedics;  Laterality: Left;  . NM INTRAVENOUS INJECTION ([facility] HX)     receives biologic infusions every 5 weeks for rheumatoid arthritis. followed by dr. Meda Coffee at [facility]  . TONSILLECTOMY    . UPPER GASTROINTESTINAL ENDOSCOPY    . WISDOM TOOTH EXTRACTION     Patient Active Problem List   Diagnosis Date Noted  . Protein-calorie malnutrition ([place]) 08/27/2017  . 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 Cubital canal compression syndrome,  right 04/18/2017  . Primary osteoarthritis of knee 03/25/2017  . 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 Pure hypercholesterolemia 08/17/2016  . MDS (myelodysplastic syndrome) ([place]) 05/12/2016  . B12 deficiency 05/04/2016  . Essential hypertension 05/04/2016  . Mixed stress and urge urinary incontinence 05/04/2016  . Rheumatoid arthritis involving multiple sites with positive rheumatoid factor ([place]) 05/04/2016      Prior to Admission medications   Medication Sig Start Date End Date Taking? Authorizing Provider  aspirin EC 81 MG tablet Take 81 mg by mouth daily.   Yes [provider]  atorvastatin (LIPITOR) 10 MG tablet  Take 5 mg by mouth daily. In the morning   Yes [provider]  B Complex-C (SUPER B COMPLEX PO) Take 1 tablet by mouth daily.   Yes [provider]  calcium carbonate (OS-CAL) 1250 (500 Ca) MG chewable tablet Chew 1 tablet by mouth daily. As needed for heartburn   Yes [provider]  Cholecalciferol (VITAMIN D3) 5000 units CAPS Take 10,000 Units by mouth daily in the afternoon.    Yes [provider]  Cyanocobalamin (VITAMIN B-12) 5000 MCG TBDP Take 5,000 mcg by mouth daily.    Yes [provider]  donepezil (ARICEPT) 10 MG tablet Take 10 mg by mouth daily in the afternoon.  03/12/18  Yes [provider]  folic acid (FOLVITE) A999333 MCG tablet Take 400 mcg by mouth daily.    Yes [provider]  gabapentin (NEURONTIN) 300 MG capsule Take 1 capsule (300 mg total) by mouth at bedtime. 08/08/17  Yes Reche Dixon, PA-C  lisinopril (ZESTRIL) 20 MG tablet Take 20 mg by mouth daily. 05/22/19  Yes [provider]  meloxicam (MOBIC) 7.5 MG tablet Take 7.5 mg by mouth daily.  04/15/18  Yes [provider]  memantine (NAMENDA) 10 MG tablet Take 10 mg by mouth 2 (two) times daily. 05/12/19  Yes [provider]  Misc Natural Products (OSTEO BI-FLEX TRIPLE STRENGTH) TABS Take 1 tablet by mouth daily.   Yes [provider]  Multiple Vitamins-Minerals (OCUVITE PO) Take 1 tablet by mouth daily.   Yes [provider]  oxybutynin (DITROPAN-XL) 10 MG 24 hr tablet Take 10 mg by mouth daily. 12/27/16  Yes [provider]  pantoprazole (PROTONIX) 40 MG tablet Take 40 mg by mouth daily.    Yes [provider]  sulfaSALAzine (AZULFIDINE) 500 MG tablet Take 1,000 mg by mouth daily.    Yes [provider]  acetaminophen (TYLENOL) 325 MG tablet Take 650 mg by mouth every 6 (six) hours as needed for moderate pain or headache.     [provider]  albuterol (VENTOLIN HFA) 108 (90 Base) MCG/ACT  inhaler Inhale 2 puffs into the lungs every 6 (six) hours as needed for wheezing or shortness of breath. 05/24/19   Merlyn Lot, MD  Artificial Tear Solution (GENTEAL TEARS) 0.1-0.2-0.3 % SOLN Place 1 drop into both eyes daily as needed (dry eye).     [provider]  diclofenac sodium (VOLTAREN) 1 % GEL Apply 2 g topically 4 (four) times daily as needed (pain). Apply to painful joint    [provider]  Menthol, Topical Analgesic, (BIOFREEZE EX) Apply 1 application topically as needed (for knee/leg pain).    [provider]  predniSONE (DELTASONE) 20 MG tablet Take 1 tablet (20 mg total) by mouth daily for 5 days. 05/24/19 05/29/19  Merlyn Lot, MD    Allergies Pollen extract  Social History Social History   Tobacco Use  . Smoking status: Never Smoker  . Smokeless tobacco: Never Used  Substance Use Topics  . Alcohol use: Yes    Comment: very occasional  . Drug use: Never    Review of Systems Patient denies headaches, rhinorrhea, blurry vision, numbness, shortness of breath, chest pain, edema, cough, abdominal pain, nausea, vomiting, diarrhea, dysuria, fevers, rashes or hallucinations unless otherwise stated above in HPI. ____________________________________________   PHYSICAL EXAM:  VITAL SIGNS: Vitals:   05/24/19 1900 05/24/19 2000  BP: (!) 149/88 (!) 147/59  Pulse:  79  Resp: 10 13  Temp:    SpO2:  100%    Constitutional: Alert and oriented.  Eyes: Conjunctivae are normal.  Head: Atraumatic. Nose: No congestion/rhinnorhea. Mouth/Throat: Mucous membranes are moist.   Neck: No stridor. Painless ROM.  Cardiovascular: Normal rate, regular rhythm. Grossly normal heart sounds.  Good peripheral circulation. Respiratory:mild tachypnea.  No retractions. Lungs CTAB. Gastrointestinal: Soft and nontender. No distention. No abdominal bruits. No CVA tenderness. Genitourinary:  Musculoskeletal: No lower extremity tenderness nor edema.  No  joint effusions. Neurologic:  Normal speech and language. No gross focal neurologic deficits are appreciated. No facial droop Skin:  Skin is warm, dry and intact. No rash noted. Psychiatric: Mood and affect are normal. Speech and behavior are normal.  ____________________________________________   LABS (all labs ordered are listed, but only abnormal results are displayed)  Results for orders placed or performed during the hospital encounter of 05/24/19 (from the past 24 hour(s))  Basic metabolic panel     Status: Abnormal   Collection Time: 05/24/19  1:37 PM  Result Value Ref Range   Sodium 137 135 - 145 mmol/L   Potassium 3.8 3.5 - 5.1 mmol/L   Chloride 106 98 - 111 mmol/L   CO2 21 (L) 22 - 32 mmol/L   Glucose, Bld 97 70 - 99 mg/dL   BUN 26 (H) 8 - 23 mg/dL   Creatinine, Ser 0.97 0.44 - 1.00 mg/dL   Calcium 9.3 8.9 - 10.3 mg/dL   GFR calc non Af Amer 56 (L) >60 mL/min   GFR calc Af Amer >60 >60 mL/min   Anion gap 10 5 - 15  CBC     Status: Abnormal   Collection Time: 05/24/19  1:37 PM  Result Value Ref Range   WBC 6.4 4.0 - 10.5 K/uL   RBC 3.84 (L) 3.87 - 5.11 MIL/uL   Hemoglobin 13.2 12.0 - 15.0 g/dL   HCT 40.3 36.0 - 46.0 %   MCV 104.9 (H) 80.0 - 100.0 fL   MCH 34.4 (H) 26.0 - 34.0 pg   MCHC 32.8 30.0 - 36.0 g/dL   RDW 12.4 11.5 - 15.5 %   Platelets 162 150 - 400 K/uL   nRBC 0.0 0.0 - 0.2 %  Troponin I (High Sensitivity)     Status: None   Collection Time: 05/24/19  1:37 PM  Result Value Ref Range   Troponin I (High Sensitivity) 7 <18 ng/L  Troponin I (High Sensitivity)     Status: None   Collection Time: 05/24/19  3:28 PM  Result Value Ref Range   Troponin I (High Sensitivity) 6 <18 ng/L  Brain natriuretic peptide     Status: None   Collection Time: 05/24/19  5:15 PM  Result Value Ref Range   B Natriuretic Peptide 81.0 0.0 - 100.0 pg/mL  POC SARS Coronavirus 2 Ag     Status: None   Collection Time:  05/24/19  5:52 PM  Result Value Ref Range   SARS Coronavirus 2  Ag NEGATIVE NEGATIVE   ____________________________________________  EKG My review and personal interpretation at Time: 13:27   Indication: sob  Rate: 70  Rhythm: sinus Axis: normal Other: normal intervals, no stemi ____________________________________________  RADIOLOGY  I personally reviewed all radiographic images ordered to evaluate for the above acute complaints and reviewed radiology reports and findings.  These findings were personally discussed with the patient.  Please see medical record for radiology report.  ____________________________________________   PROCEDURES  Procedure(s) performed:  Procedures    Critical Care performed: no ____________________________________________   INITIAL IMPRESSION / ASSESSMENT AND PLAN / ED COURSE  Pertinent labs & imaging results that were available during my care of the patient were reviewed by me and considered in my medical decision making (see chart for details).   DDX: Asthma, copd, CHF, pna, ptx, malignancy, Pe, anemia   Sylvia Miller is a 79 y.o. who presents to the ED with shortness of breath symptoms described above.  Patient nontoxic-appearing mildly tachypneic but in no acute distress with no hypoxia.  Blood will be sent for by differential.  Will order CTA to exclude PE given her risk factors.  Will trial nebulizer.  She is not showing any signs of infectious process have a lower suspicion for pneumonia.  Clinical Course as of May 24 2106  Sat May 24, 2019  1712 Patient does feel improved.  Remains with no hypoxia.  No evidence of PE.  Initial troponin negative.  Have a lower suspicion for edema.   [PR]  1959 Patient with no hypoxia.  Feels much improved after nebulizer and Decadron.  I suspect bronchitis.  She feels well and is ready to go home.  I think this is reasonable given her reassuring work-up.  Discussed signs and symptoms for which she should return to the ER.   [PR]    Clinical Course User Index [PR]  Merlyn Lot, MD    The patient was evaluated in Emergency Department today for the symptoms described in the history of present illness. He/she was evaluated in the context of the global COVID-19 pandemic, which necessitated consideration that the patient might be at risk for infection with the SARS-CoV-2 virus that causes COVID-19. Institutional protocols and algorithms that pertain to the evaluation of patients at risk for COVID-19 are in a state of rapid change based on information released by regulatory bodies including the CDC and federal and state organizations. These policies and algorithms were followed during the patient's care in the ED.  As part of my medical decision making, I reviewed the following data within the [place] notes reviewed and incorporated, Labs reviewed, notes from prior ED visits and Veteran Controlled Substance Database   ____________________________________________   FINAL CLINICAL IMPRESSION(S) / ED DIAGNOSES  Final diagnoses:  SOB (shortness of breath)      NEW MEDICATIONS STARTED DURING THIS VISIT:  Discharge Medication List as of 05/24/2019  8:01 PM    START taking these medications   Details  albuterol (VENTOLIN HFA) 108 (90 Base) MCG/ACT inhaler Inhale 2 puffs into the lungs every 6 (six) hours as needed for wheezing or shortness of breath., Starting Sat 05/24/2019, Normal    predniSONE (DELTASONE) 20 MG tablet Take 1 tablet (20 mg total) by mouth daily for 5 days., Starting Sat 05/24/2019, Until Thu 05/29/2019, Normal         Note:  This document was prepared using  [employer] training and education officer and may include unintentional dictation errors.    Merlyn Lot, MD 05/24/19 2108

## 2019-05-24 NOTE — ED Notes (Signed)
Pt assisted to bathroom

## 2020-05-22 IMAGING — CT CT ANGIO CHEST
2 of 6 series · 18 of 46 positions shown · IV contrast (APPLIED)
Comparison: None.

CLINICAL DATA: Shortness of breath with rest and exertion.

EXAM:
CT ANGIOGRAPHY CHEST WITH CONTRAST
TECHNIQUE: Multidetector CT imaging of the chest was performed using the
standard protocol during bolus administration of intravenous
contrast. Multiplanar CT image reconstructions and MIPs were
obtained to evaluate the vascular anatomy.
CONTRAST:  75mL OMNIPAQUE IOHEXOL 350 MG/ML SOLN

[Series 5: thins · axial · 0.65mm/px · z∈[-942,-731]mm · 15 of 233 slices shown]
[im 11/233  lung]
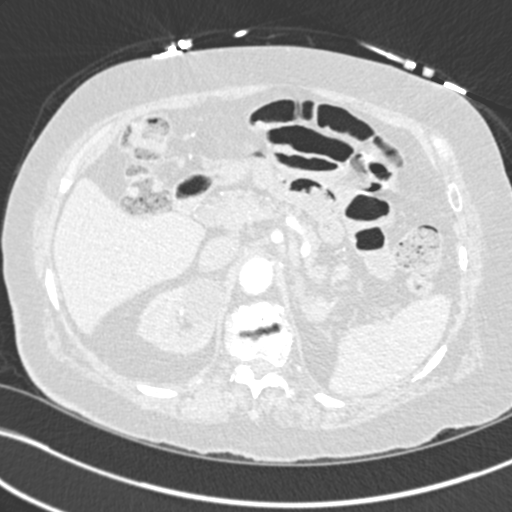
[im 31/233  soft-tissue]
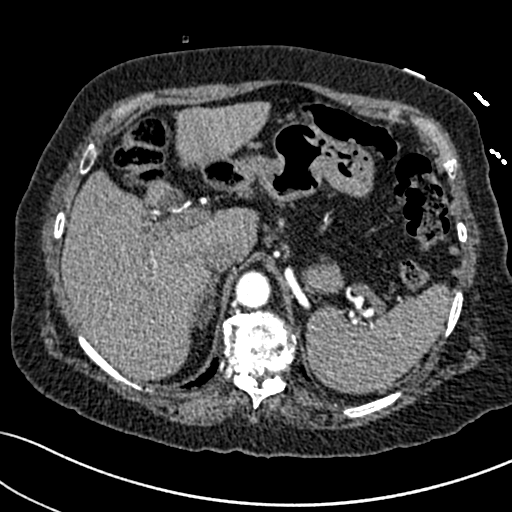
[im 41/233  lung]
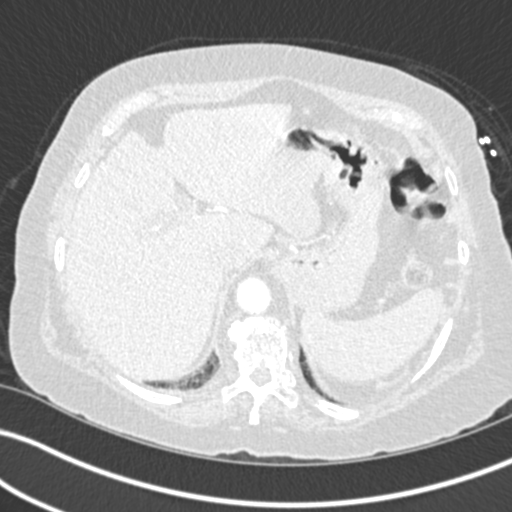
[im 61/233  soft-tissue]
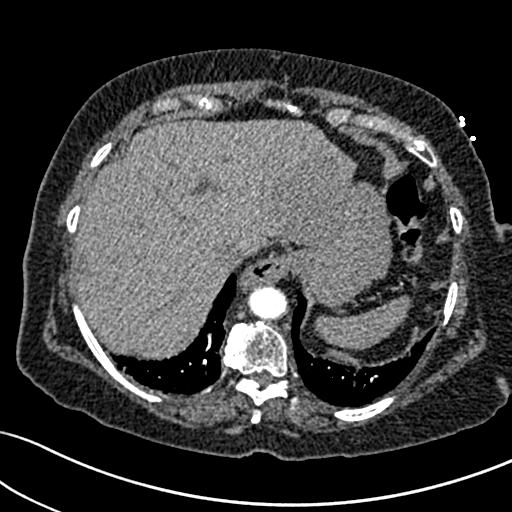
[im 71/233  lung]
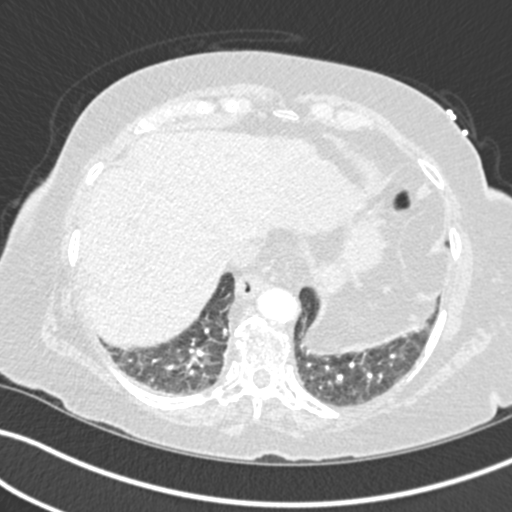
[im 91/233  soft-tissue]
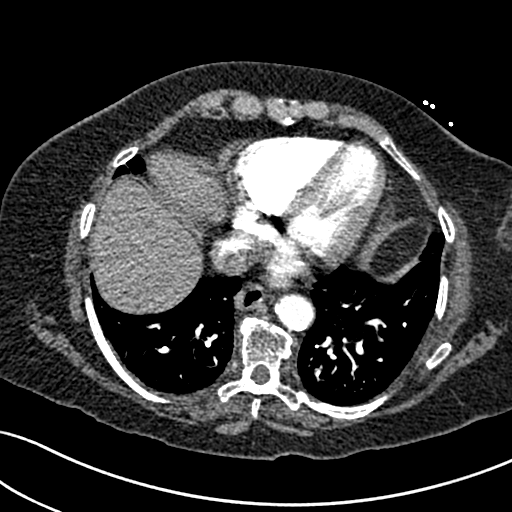
[im 101/233  lung]
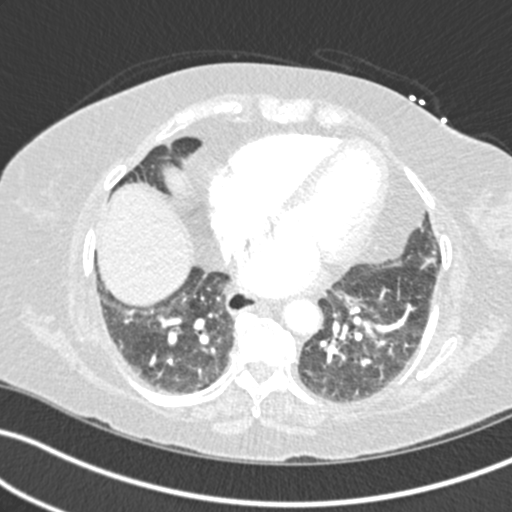
[im 122/233  soft-tissue]
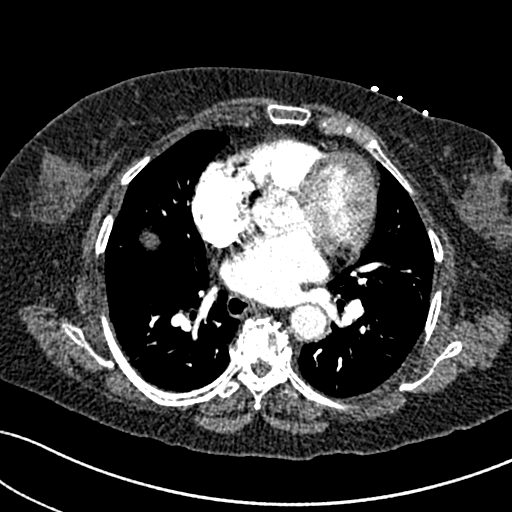
[im 132/233  lung]
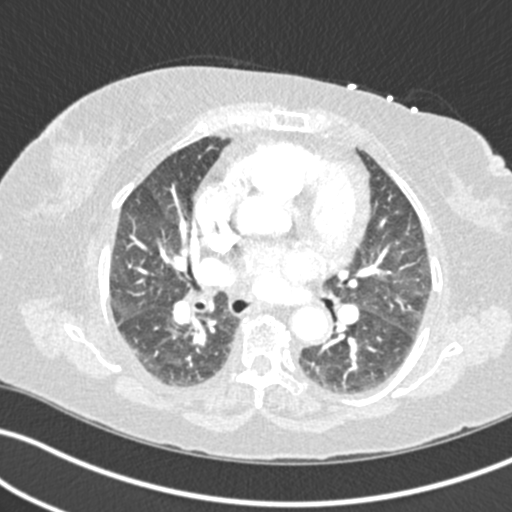
[im 142/233  soft-tissue]
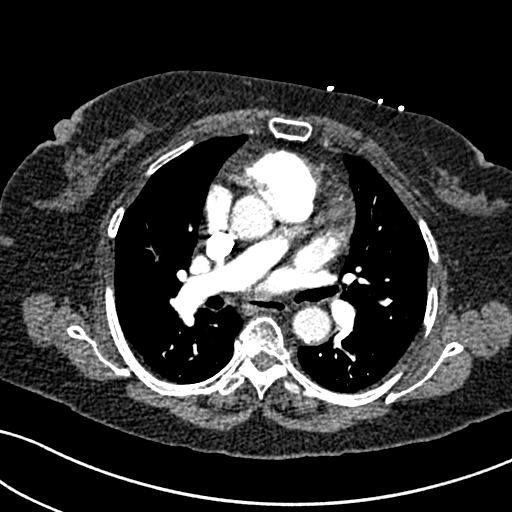
[im 162/233  lung]
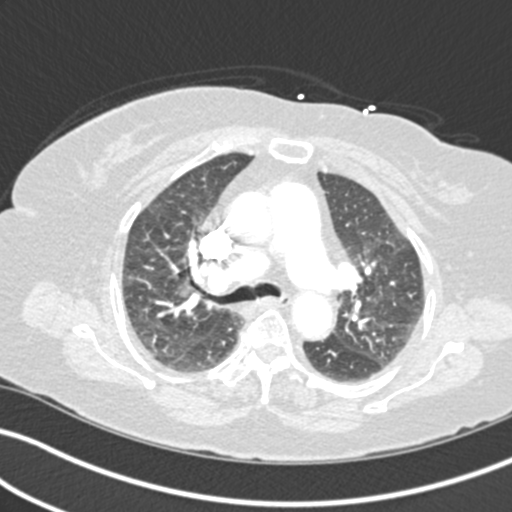
[im 172/233  soft-tissue]
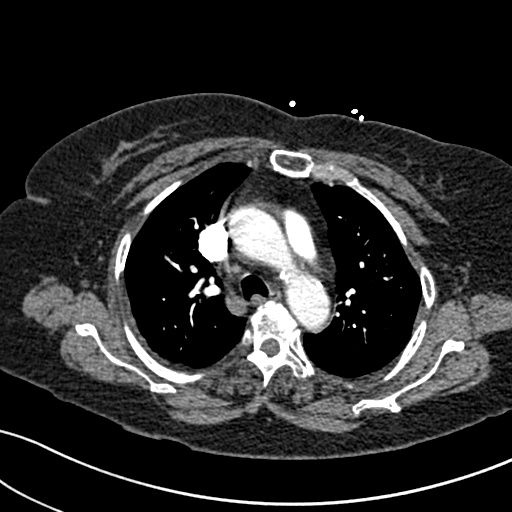
[im 192/233  lung]
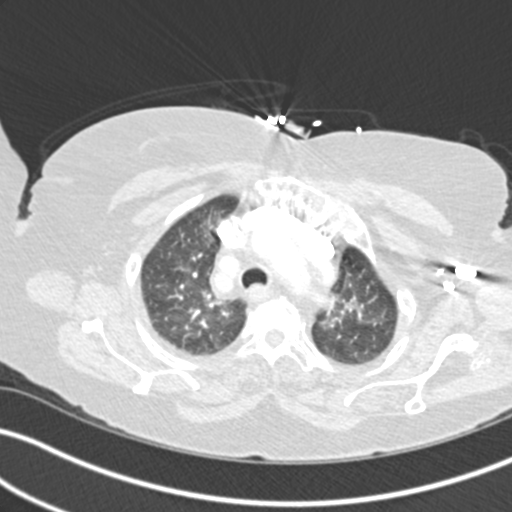
[im 202/233  soft-tissue]
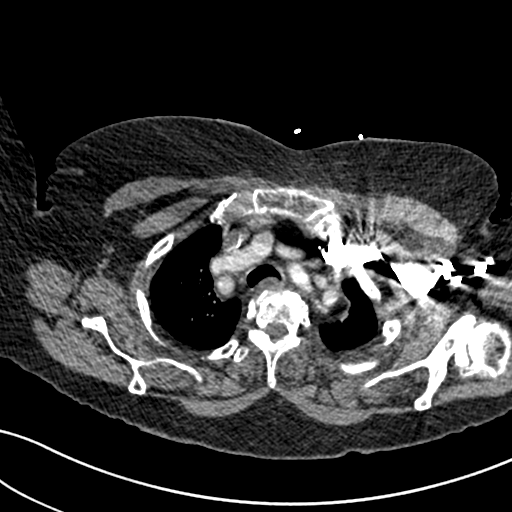
[im 222/233  lung]
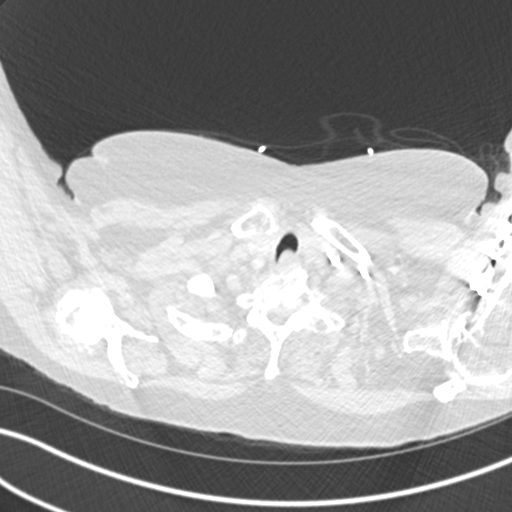

[Series 7: coronal mpr · coronal · 0.46mm/px · 3 of 88 slices shown]
[im 22/88  soft-tissue]
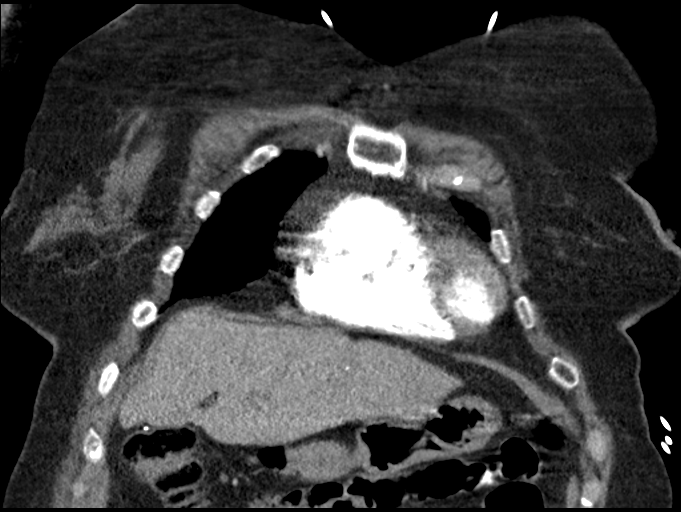
[im 44/88  soft-tissue]
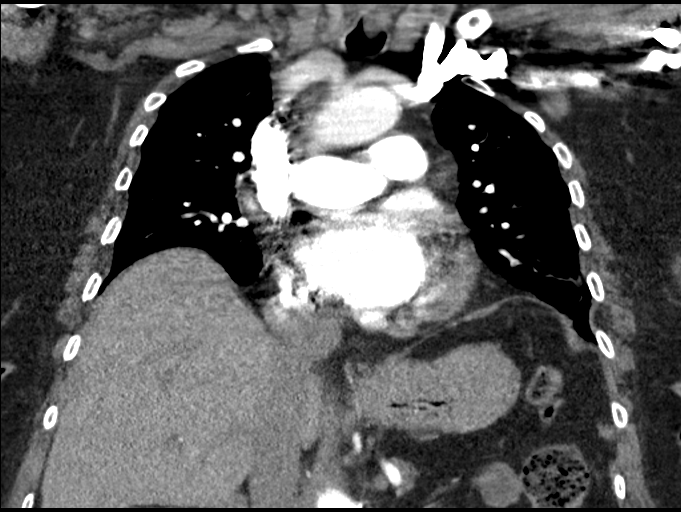
[im 66/88  soft-tissue]
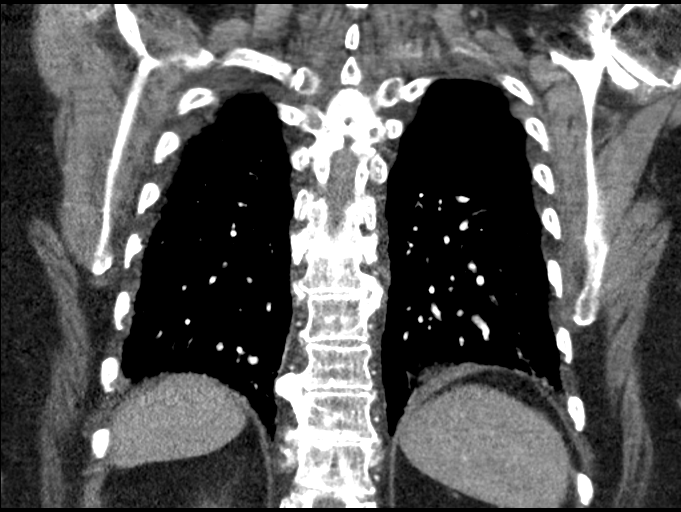

[18 of 46 positions shown; findings below may reference images not displayed]

FINDINGS: Cardiovascular: Satisfactory opacification of the pulmonary arteries
to the segmental level. No evidence of pulmonary embolism. Heart
size is mildly enlarged. No pericardial effusion. Mild
atherosclerotic calcification of the thoracic aorta without
aneurysm.

Mediastinum/Nodes: No enlarged mediastinal, hilar, or axillary lymph
nodes. Thyroid gland, trachea demonstrate no significant findings.
Small hiatal hernia.

Lungs/Pleura: The central airways are patent. No pneumothorax or
pleural effusion. There is diffuse bilateral ground-glass mosaic
attenuation which is nonspecific. Scattered atelectasis. There is a
small nonspecific cyst at the right lung base, possibly post
infectious. No suspicious pulmonary nodule or mass.

Upper Abdomen: Status post cholecystectomy. Right renal cyst.
Otherwise unremarkable.

Musculoskeletal: No chest wall abnormality. Degenerative changes in
the bilateral shoulders. Multilevel degenerative disc disease in the
thoracic spine.

Review of the MIP images confirms the above findings.
IMPRESSION: 1. No evidence of pulmonary embolism.
2. Scattered atelectasis and diffuse bilateral ground-glass mosaic
attenuation which is nonspecific, may be related to expiratory phase
of the study. However differential also includes mild edema and
small airways disease.

Aortic Atherosclerosis ([id number]-MUX.X).

## 2020-07-06 ENCOUNTER — Inpatient Hospital Stay
Admission: EM | Admit: 2020-07-06 | Discharge: 2020-07-08 | DRG: 682 | Disposition: A | Discharge status: Home Health | Payer: Medicare Other | Attending: Internal Medicine | Admitting: Internal Medicine

## 2020-07-06 ENCOUNTER — Other Ambulatory Visit: Payer: Self-pay

## 2020-07-06 ENCOUNTER — Emergency Department: Payer: Medicare Other

## 2020-07-06 DIAGNOSIS — D469 Myelodysplastic syndrome, unspecified: Secondary | ICD-10-CM | POA: Diagnosis present

## 2020-07-06 DIAGNOSIS — F0390 Unspecified dementia without behavioral disturbance: Secondary | ICD-10-CM | POA: Diagnosis present

## 2020-07-06 DIAGNOSIS — M0579 Rheumatoid arthritis with rheumatoid factor of multiple sites without organ or systems involvement: Secondary | ICD-10-CM | POA: Diagnosis present

## 2020-07-06 DIAGNOSIS — U071 COVID-19: Secondary | ICD-10-CM | POA: Diagnosis present

## 2020-07-06 DIAGNOSIS — E872 Acidosis: Secondary | ICD-10-CM | POA: Diagnosis not present

## 2020-07-06 DIAGNOSIS — E860 Dehydration: Secondary | ICD-10-CM | POA: Diagnosis present

## 2020-07-06 DIAGNOSIS — Z801 Family history of malignant neoplasm of trachea, bronchus and lung: Secondary | ICD-10-CM | POA: Diagnosis not present

## 2020-07-06 DIAGNOSIS — E7800 Pure hypercholesterolemia, unspecified: Secondary | ICD-10-CM | POA: Diagnosis present

## 2020-07-06 DIAGNOSIS — I10 Essential (primary) hypertension: Secondary | ICD-10-CM | POA: Diagnosis present

## 2020-07-06 DIAGNOSIS — Z791 Long term (current) use of non-steroidal anti-inflammatories (NSAID): Secondary | ICD-10-CM

## 2020-07-06 DIAGNOSIS — K219 Gastro-esophageal reflux disease without esophagitis: Secondary | ICD-10-CM | POA: Diagnosis present

## 2020-07-06 DIAGNOSIS — Z96653 Presence of artificial knee joint, bilateral: Secondary | ICD-10-CM | POA: Diagnosis present

## 2020-07-06 DIAGNOSIS — F0280 Dementia in other diseases classified elsewhere without behavioral disturbance: Secondary | ICD-10-CM | POA: Diagnosis not present

## 2020-07-06 DIAGNOSIS — E871 Hypo-osmolality and hyponatremia: Secondary | ICD-10-CM | POA: Diagnosis present

## 2020-07-06 DIAGNOSIS — Z7982 Long term (current) use of aspirin: Secondary | ICD-10-CM | POA: Diagnosis not present

## 2020-07-06 DIAGNOSIS — Z79899 Other long term (current) drug therapy: Secondary | ICD-10-CM

## 2020-07-06 DIAGNOSIS — N179 Acute kidney failure, unspecified: Secondary | ICD-10-CM | POA: Diagnosis present

## 2020-07-06 DIAGNOSIS — E785 Hyperlipidemia, unspecified: Secondary | ICD-10-CM | POA: Diagnosis present

## 2020-07-06 DIAGNOSIS — G301 Alzheimer's disease with late onset: Secondary | ICD-10-CM | POA: Diagnosis not present

## 2020-07-06 LAB — URINALYSIS, COMPLETE (UACMP) WITH MICROSCOPIC
Bilirubin Urine: NEGATIVE
Glucose, UA: NEGATIVE mg/dL
Hgb urine dipstick: NEGATIVE
Ketones, ur: NEGATIVE mg/dL
Leukocytes,Ua: NEGATIVE
Nitrite: NEGATIVE
Protein, ur: NEGATIVE mg/dL
Specific Gravity, Urine: 1.014 (ref 1.005–1.030)
pH: 5 (ref 5.0–8.0)

## 2020-07-06 LAB — BASIC METABOLIC PANEL
Anion gap: 10 (ref 5–15)
BUN: 46 mg/dL — ABNORMAL HIGH (ref 8–23)
CO2: 19 mmol/L — ABNORMAL LOW (ref 22–32)
Calcium: 8.5 mg/dL — ABNORMAL LOW (ref 8.9–10.3)
Chloride: 106 mmol/L (ref 98–111)
Creatinine, Ser: 1.53 mg/dL — ABNORMAL HIGH (ref 0.44–1.00)
GFR, Estimated: 34 mL/min — ABNORMAL LOW (ref >60)
Glucose, Bld: 93 mg/dL (ref 70–99)
Potassium: 4.9 mmol/L (ref 3.5–5.1)
Sodium: 135 mmol/L (ref 135–145)

## 2020-07-06 LAB — SODIUM, URINE, RANDOM: Sodium, Ur: 63 mmol/L

## 2020-07-06 LAB — RESP PANEL BY RT-PCR (FLU A&B, COVID) ARPGX2
Influenza A by PCR: NEGATIVE
Influenza B by PCR: NEGATIVE
SARS Coronavirus 2 by RT PCR: POSITIVE — AB

## 2020-07-06 LAB — TROPONIN I (HIGH SENSITIVITY)
Troponin I (High Sensitivity): 13 ng/L (ref <18)
Troponin I (High Sensitivity): 14 ng/L (ref <18)

## 2020-07-06 LAB — OSMOLALITY, URINE: Osmolality, Ur: 500 mOsm/kg (ref 300–900)

## 2020-07-06 LAB — MAGNESIUM: Magnesium: 2.1 mg/dL (ref 1.7–2.4)

## 2020-07-06 LAB — CK: Total CK: 44 U/L (ref 38–234)

## 2020-07-06 LAB — TSH: TSH: 0.683 u[IU]/mL (ref 0.350–4.500)

## 2020-07-06 LAB — OSMOLALITY: Osmolality: 301 mOsm/kg — ABNORMAL HIGH (ref 275–295)

## 2020-07-06 MED ORDER — SODIUM CHLORIDE 0.9 % IV BOLUS
1000.0000 mL | Freq: Once | INTRAVENOUS | Status: AC
  Administered 2020-07-06: 1000 mL via INTRAVENOUS

## 2020-07-06 MED ORDER — SODIUM CHLORIDE 0.9 % IV SOLN: INTRAVENOUS | Status: DC

## 2020-07-06 MED ORDER — SODIUM CHLORIDE 0.9 % IV SOLN: INTRAVENOUS | Status: AC

## 2020-07-06 MED ORDER — POLYVINYL ALCOHOL 1.4 % OP SOLN
1.0000 [drp] | Freq: Every day | OPHTHALMIC | Status: DC | PRN
  Filled 2020-07-06: qty 15

## 2020-07-06 MED ORDER — MEMANTINE HCL 5 MG PO TABS
10.0000 mg | ORAL_TABLET | Freq: Two times a day (BID) | ORAL | Status: DC
  Administered 2020-07-06 – 2020-07-08 (×4): 10 mg via ORAL
  Filled 2020-07-06 (×4): qty 2

## 2020-07-06 MED ORDER — LOPERAMIDE HCL 2 MG PO CAPS
2.0000 mg | ORAL_CAPSULE | ORAL | Status: DC | PRN
  Administered 2020-07-06: 2 mg via ORAL

## 2020-07-06 MED ORDER — ONDANSETRON HCL 4 MG PO TABS: 4.0000 mg | ORAL_TABLET | Freq: Four times a day (QID) | ORAL | Status: DC | PRN

## 2020-07-06 MED ORDER — ALBUTEROL SULFATE HFA 108 (90 BASE) MCG/ACT IN AERS
2.0000 | INHALATION_SPRAY | Freq: Four times a day (QID) | RESPIRATORY_TRACT | Status: DC | PRN
  Filled 2020-07-06: qty 6.7

## 2020-07-06 MED ORDER — SODIUM CHLORIDE 0.9 % IV SOLN
200.0000 mg | Freq: Once | INTRAVENOUS | Status: AC
  Administered 2020-07-06: 200 mg via INTRAVENOUS
  Filled 2020-07-06: qty 40

## 2020-07-06 MED ORDER — ONDANSETRON HCL 4 MG/2ML IJ SOLN: 4.0000 mg | Freq: Four times a day (QID) | INTRAMUSCULAR | Status: DC | PRN

## 2020-07-06 MED ORDER — ASPIRIN EC 81 MG PO TBEC
81.0000 mg | DELAYED_RELEASE_TABLET | Freq: Every day | ORAL | Status: DC
  Administered 2020-07-06 – 2020-07-08 (×3): 81 mg via ORAL
  Filled 2020-07-06 (×3): qty 1

## 2020-07-06 MED ORDER — GUAIFENESIN-DM 100-10 MG/5ML PO SYRP
10.0000 mL | ORAL_SOLUTION | ORAL | Status: DC | PRN
  Administered 2020-07-07: 10 mL via ORAL
  Filled 2020-07-06: qty 10

## 2020-07-06 MED ORDER — ACETAMINOPHEN 325 MG PO TABS: 650.0000 mg | ORAL_TABLET | Freq: Four times a day (QID) | ORAL | Status: DC | PRN

## 2020-07-06 MED ORDER — ATORVASTATIN CALCIUM 10 MG PO TABS
5.0000 mg | ORAL_TABLET | Freq: Every day | ORAL | Status: DC
  Administered 2020-07-06 – 2020-07-08 (×3): 5 mg via ORAL
  Filled 2020-07-06 (×3): qty 1

## 2020-07-06 MED ORDER — HYDROCOD POLST-CPM POLST ER 10-8 MG/5ML PO SUER
5.0000 mL | Freq: Two times a day (BID) | ORAL | Status: DC | PRN
  Administered 2020-07-07: 5 mL via ORAL
  Filled 2020-07-06: qty 5

## 2020-07-06 MED ORDER — ZINC SULFATE 220 (50 ZN) MG PO CAPS
220.0000 mg | ORAL_CAPSULE | Freq: Every day | ORAL | Status: DC
  Administered 2020-07-06 – 2020-07-08 (×3): 220 mg via ORAL
  Filled 2020-07-06 (×3): qty 1

## 2020-07-06 MED ORDER — ASCORBIC ACID 500 MG PO TABS
500.0000 mg | ORAL_TABLET | Freq: Every day | ORAL | Status: DC
  Administered 2020-07-06 – 2020-07-08 (×3): 500 mg via ORAL
  Filled 2020-07-06 (×3): qty 1

## 2020-07-06 MED ORDER — SODIUM CHLORIDE 0.9 % IV SOLN
100.0000 mg | Freq: Every day | INTRAVENOUS | Status: AC
  Administered 2020-07-07 – 2020-07-08 (×2): 100 mg via INTRAVENOUS
  Filled 2020-07-06 (×2): qty 100

## 2020-07-06 MED ORDER — ENOXAPARIN SODIUM 30 MG/0.3ML IJ SOSY
30.0000 mg | PREFILLED_SYRINGE | INTRAMUSCULAR | Status: DC
  Administered 2020-07-06: 30 mg via SUBCUTANEOUS
  Filled 2020-07-06: qty 0.3

## 2020-07-06 MED ORDER — OXYBUTYNIN CHLORIDE ER 10 MG PO TB24
10.0000 mg | ORAL_TABLET | Freq: Every day | ORAL | Status: DC
  Administered 2020-07-07 – 2020-07-08 (×2): 10 mg via ORAL
  Filled 2020-07-06 (×2): qty 1

## 2020-07-06 NOTE — ED Triage Notes (Addendum)
Pt sent from [facility] with abnormal labs, BUN Creat are elevated. Pt Is a pour historian.   Per pt husband, pt has not been eating or drinking well, lost 10lbs , is not wanting to get out of bed.

## 2020-07-06 NOTE — ED Notes (Signed)
ED Provider at bedside. 

## 2020-07-06 NOTE — ED Provider Notes (Signed)
[facility] Emergency Department Provider Note  ____________________________________________   Event Date/Time   First MD Initiated Contact with Patient 07/06/20 1402     (approximate)  I have reviewed the triage vital signs and the nursing notes.   HISTORY  Chief Complaint Abnormal Lab    HPI Sylvia Miller is a 80 y.o. female with past medical severity of high [place] it, hyperlipidemia, mild dysplastic syndrome, here with decreased p.o. intake.  History provided primarily by the patient's husband.  Patient has a history of mild to moderate dementia.  Per report, she has been increasingly weak and has not wanted to eat or drink essentially anything for the last several days.  She has been sleeping more.  She is not wanting to take her medications either.  She went saw her PCP today, who sent lab work which revealed significant hyponatremia.  She was told to come to the ER for admission.  On my assessment, patient denies any complaints although she also does not necessarily remember why she is here.  Denies any pain.  She states she just does not have an appetite and that she gets nauseous when she tries to eat.  Denies any abdominal pain.        Past Medical History:  Diagnosis Date  . Allergy   . Anemia   . Arthritis    rheumatoid /knees/hands/shoulders (Right shoulder) infusion every 5 weeks  . Bladder incontinence   . Chicken pox   . Dyspnea    with exertion or far walking  . Gastric ulcer   . GERD (gastroesophageal reflux disease)   . Heart murmur   . Hypercholesteremia   . Hyperlipidemia, unspecified   . Hypertension    controlled with meds;   Marland Kitchen MDS (myelodysplastic syndrome) ([place]) 2000  . Memory deficit   . Muscular dystrophy ([place])    per patient, this is incorrect  . Myelodysplastic syndrome ([place])   . Myelodysplastic syndrome ([place])   . Rheumatoid arthritis ([place])   . Ulcer     Patient Active Problem List   Diagnosis Date Noted  .  AKI (acute kidney injury) ([place]) 07/06/2020  . COVID-19 virus detected 07/06/2020  . Dementia without behavioral disturbance ([place]) 07/06/2020  . Protein-calorie malnutrition ([place]) 08/27/2017  . 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 Cubital canal compression syndrome, right 04/18/2017  . Primary osteoarthritis of knee 03/25/2017  . 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 Pure hypercholesterolemia 08/17/2016  . MDS (myelodysplastic syndrome) ([place]) 05/12/2016  . B12 deficiency 05/04/2016  . Essential hypertension 05/04/2016  . Mixed stress and urge urinary incontinence 05/04/2016  . Rheumatoid arthritis involving multiple sites with positive rheumatoid factor ([place]) 05/04/2016    Past Surgical History:  Procedure Laterality Date  . ABDOMINAL HYSTERECTOMY    . APPENDECTOMY     as a child  . CHOLECYSTECTOMY    . COLONOSCOPY    . COLONOSCOPY WITH PROPOFOL N/A 04/24/2017   Procedure: COLONOSCOPY WITH PROPOFOL;  Surgeon: Lollie Sails, MD;  Location: [facility] ENDOSCOPY;  Service: Endoscopy;  Laterality: N/A;  . ESOPHAGOGASTRODUODENOSCOPY (EGD) WITH PROPOFOL N/A 04/24/2017   Procedure: ESOPHAGOGASTRODUODENOSCOPY (EGD) WITH PROPOFOL;  Surgeon: Lollie Sails, MD;  Location: [facility] ENDOSCOPY;  Service: Endoscopy;  Laterality: N/A;  . JOINT REPLACEMENT    . KNEE ARTHROPLASTY Right 02/21/2017   Procedure: COMPUTER ASSISTED TOTAL KNEE ARTHROPLASTY;  Surgeon: Dereck Leep, MD;  Location: [facility] ORS;  Service: Orthopedics;  Laterality: Right;  . KNEE ARTHROPLASTY  Left 08/06/2017   Procedure: COMPUTER ASSISTED TOTAL KNEE ARTHROPLASTY;  Surgeon: Dereck Leep, MD;  Location: [facility] ORS;  Service: Orthopedics;  Laterality: Left;  . NM INTRAVENOUS INJECTION ([facility] HX)     receives biologic infusions every 5 weeks for rheumatoid arthritis. followed by dr. Meda Coffee at [facility]  . TONSILLECTOMY    . UPPER GASTROINTESTINAL ENDOSCOPY    . WISDOM TOOTH EXTRACTION      Prior to  Admission medications   Medication Sig Start Date End Date Taking? Authorizing Provider  acetaminophen (TYLENOL) 325 MG tablet Take 650 mg by mouth every 6 (six) hours as needed for moderate pain or headache.    Yes [provider]  albuterol (VENTOLIN HFA) 108 (90 Base) MCG/ACT inhaler Inhale 2 puffs into the lungs every 6 (six) hours as needed for wheezing or shortness of breath. 05/24/19  Yes Merlyn Lot, MD  Artificial Tear Solution (GENTEAL TEARS) 0.1-0.2-0.3 % SOLN Place 1 drop into both eyes daily as needed (dry eye).    Yes [provider]  aspirin EC 81 MG tablet Take 81 mg by mouth daily.   Yes [provider]  atorvastatin (LIPITOR) 10 MG tablet Take 5 mg by mouth daily. In the morning   Yes [provider]  diclofenac sodium (VOLTAREN) 1 % GEL Apply 2 g topically 4 (four) times daily as needed (pain). Apply to painful joint   Yes [provider]  hydrochlorothiazide (HYDRODIURIL) 12.5 MG tablet Take 12.5 mg by mouth daily. 04/27/20  Yes [provider]  lisinopril (ZESTRIL) 20 MG tablet Take 20 mg by mouth daily. 05/22/19  Yes [provider]  meloxicam (MOBIC) 7.5 MG tablet Take 7.5 mg by mouth daily as needed. 04/15/18  Yes [provider]  memantine (NAMENDA) 10 MG tablet Take 10 mg by mouth 2 (two) times daily. 05/12/19  Yes [provider]  Menthol, Topical Analgesic, (BIOFREEZE EX) Apply 1 application topically as needed (for knee/leg pain).   Yes [provider]  mirtazapine (REMERON) 7.5 MG tablet Take 7.5 mg by mouth at bedtime. 06/06/20  Yes [provider]  Multiple Vitamins-Minerals (OCUVITE PO) Take 0.5 tablets by mouth in the morning and at bedtime.   Yes [provider]  oxybutynin (DITROPAN-XL) 10 MG 24 hr tablet Take 10 mg by mouth daily. 12/27/16  Yes [provider]  gabapentin (NEURONTIN) 300 MG capsule Take 1 capsule (300 mg total) by mouth at  bedtime. Patient not taking: Reported on 07/06/2020 08/08/17   Reche Dixon, PA-C    Allergies Pollen extract  Family History  Problem Relation Age of Onset  . Cancer Mother   . Lung cancer Mother   . Lung cancer Father   . Colon cancer Neg Hx   . Colon polyps Neg Hx   . Rectal cancer Neg Hx     Social History Social History   Tobacco Use  . Smoking status: Never Smoker  . Smokeless tobacco: Never Used  Vaping Use  . Vaping Use: Never used  Substance Use Topics  . Alcohol use: Yes    Comment: very occasional  . Drug use: Never    Review of Systems  Review of Systems  Unable to perform ROS: Dementia  Constitutional: Positive for fatigue.  Neurological: Positive for weakness.     ____________________________________________  PHYSICAL EXAM:      VITAL SIGNS: ED Triage Vitals [07/06/20 1356]  Enc Vitals Group     BP (!) 156/84  Pulse Rate 68     Resp 17     Temp 98.2 F (36.8 C)     Temp Source Oral     SpO2 100 %     Weight 133 lb (60.3 kg)     Height 4\' 10"  (1.473 m)     Head Circumference      Peak Flow      Pain Score 0     Pain Loc      Pain Edu?      Excl. in [place]?      Physical Exam Vitals and nursing note reviewed.  Constitutional:      General: She is not in acute distress.    Appearance: She is well-developed.  HENT:     Head: Normocephalic and atraumatic.     Mouth/Throat:     Mouth: Mucous membranes are dry.  Eyes:     Conjunctiva/sclera: Conjunctivae normal.  Cardiovascular:     Rate and Rhythm: Normal rate and regular rhythm.     Heart sounds: Normal heart sounds.  Pulmonary:     Effort: Pulmonary effort is normal. No respiratory distress.     Breath sounds: No wheezing.  Abdominal:     General: There is no distension.  Musculoskeletal:     Cervical back: Neck supple.  Skin:    General: Skin is warm.     Capillary Refill: Capillary refill takes less than 2 seconds.     Findings: No rash.  Neurological:     Mental Status:  She is alert.     Motor: No abnormal muscle tone.     Comments: Oriented to person and place but not time.  No focal deficits.  Cranial nerves intact.       ____________________________________________   LABS (all labs ordered are listed, but only abnormal results are displayed)  Labs Reviewed  RESP PANEL BY RT-PCR (FLU A&B, COVID) ARPGX2 - Abnormal; Notable for the following components:      Result Value   SARS Coronavirus 2 by RT PCR POSITIVE (*)    All other components within normal limits  OSMOLALITY - Abnormal; Notable for the following components:   Osmolality 301 (*)    All other components within normal limits  BASIC METABOLIC PANEL - Abnormal; Notable for the following components:   CO2 19 (*)    BUN 46 (*)    Creatinine, Ser 1.53 (*)    Calcium 8.5 (*)    GFR, Estimated 34 (*)    All other components within normal limits  URINALYSIS, COMPLETE (UACMP) WITH MICROSCOPIC - Abnormal; Notable for the following components:   Color, Urine YELLOW (*)    APPearance CLEAR (*)    Bacteria, UA FEW (*)    All other components within normal limits  MAGNESIUM  TSH  SODIUM, URINE, RANDOM  OSMOLALITY, URINE  BASIC METABOLIC PANEL  CBC  CK  CBC WITH DIFFERENTIAL/PLATELET  C-REACTIVE PROTEIN  D-DIMER, QUANTITATIVE  FERRITIN  MAGNESIUM  PHOSPHORUS  TROPONIN I (HIGH SENSITIVITY)  TROPONIN I (HIGH SENSITIVITY)    ____________________________________________  EKG:  ________________________________________  RADIOLOGY All imaging, including plain films, CT scans, and ultrasounds, independently reviewed by me, and interpretations confirmed via formal radiology reads.  ED MD interpretation:   CR: Clear  Official radiology report(s): DG Chest Portable 1 View  Result Date: 07/06/2020 CLINICAL DATA:  Recent weight loss, dehydration EXAM: PORTABLE CHEST 1 VIEW COMPARISON:  05/24/2019 FINDINGS: Single frontal view of the chest demonstrates an unremarkable cardiac silhouette. No  acute airspace disease, effusion, or pneumothorax. Marked degenerative changes of the bilateral shoulders. No acute bony abnormalities. IMPRESSION: 1. No acute intrathoracic process. Electronically Signed   By: Randa Ngo M.D.   On: 07/06/2020 15:26    ____________________________________________  PROCEDURES   Procedure(s) performed (including Critical Care):  .1-3 Lead EKG Interpretation Performed by: Duffy Bruce, MD Authorized by: Duffy Bruce, MD     Interpretation: normal     ECG rate:  80-90   ECG rate assessment: normal     Rhythm: sinus rhythm     Ectopy: none     Conduction: normal   Comments:     Indication: weakness    ____________________________________________  INITIAL IMPRESSION / MDM / ASSESSMENT AND PLAN / ED COURSE  As part of my medical decision making, I reviewed the following data within the [place] notes reviewed and incorporated, Old chart reviewed, Notes from prior ED visits, and [place] Controlled Substance Database       *Sylvia Miller was evaluated in Emergency Department on 07/06/2020 for the symptoms described in the history of present illness. She was evaluated in the context of the global COVID-19 pandemic, which necessitated consideration that the patient might be at risk for infection with the SARS-CoV-2 virus that causes COVID-19. Institutional protocols and algorithms that pertain to the evaluation of patients at risk for COVID-19 are in a state of rapid change based on information released by regulatory bodies including the CDC and federal and state organizations. These policies and algorithms were followed during the patient's care in the ED.  Some ED evaluations and interventions may be delayed as a result of limited staffing during the pandemic.*     Medical Decision Making:  80 yo F here with decreased LOC, PO intake. Labs from PCP reviewed and show significant hyponatremia, AKI with dehydration. Will give  fluids, admit. Additional labs, urine studies sent. COVID pending.  ____________________________________________  FINAL CLINICAL IMPRESSION(S) / ED DIAGNOSES  Final diagnoses:  AKI (acute kidney injury) ([place])  Hyponatremia     MEDICATIONS GIVEN DURING THIS VISIT:  Medications  acetaminophen (TYLENOL) tablet 650 mg (has no administration in time range)  aspirin EC tablet 81 mg (has no administration in time range)  atorvastatin (LIPITOR) tablet 5 mg (has no administration in time range)  memantine (NAMENDA) tablet 10 mg (has no administration in time range)  oxybutynin (DITROPAN-XL) 24 hr tablet 10 mg (has no administration in time range)  albuterol (VENTOLIN HFA) 108 (90 Base) MCG/ACT inhaler 2 puff (has no administration in time range)  polyvinyl alcohol (LIQUIFILM TEARS) 1.4 % ophthalmic solution 1 drop (has no administration in time range)  enoxaparin (LOVENOX) injection 30 mg (has no administration in time range)  ondansetron (ZOFRAN) tablet 4 mg (has no administration in time range)    Or  ondansetron (ZOFRAN) injection 4 mg (has no administration in time range)  0.9 %  sodium chloride infusion (has no administration in time range)  remdesivir 200 mg in sodium chloride 0.9% 250 mL IVPB (has no administration in time range)    Followed by  remdesivir 100 mg in sodium chloride 0.9 % 100 mL IVPB (has no administration in time range)  guaiFENesin-dextromethorphan (ROBITUSSIN DM) 100-10 MG/5ML syrup 10 mL (has no administration in time range)  chlorpheniramine-HYDROcodone (TUSSIONEX) 10-8 MG/5ML suspension 5 mL (has no administration in time range)  ascorbic acid (VITAMIN C) tablet 500 mg (has no administration in time range)  zinc sulfate capsule 220 mg (has no  administration in time range)  sodium chloride 0.9 % bolus 1,000 mL (0 mLs Intravenous Stopped 07/06/20 1623)     ED Discharge Orders    None       Note:  This document was prepared using Dragon voice recognition  software and may include unintentional dictation errors.   Duffy Bruce, MD 07/06/20 [phone]

## 2020-07-06 NOTE — Progress Notes (Signed)
PHARMACIST - PHYSICIAN COMMUNICATION  CONCERNING:  Enoxaparin (Lovenox) for DVT Prophylaxis   DESCRIPTION: Patient was prescribed enoxaprin 40mg  q24 hours for VTE prophylaxis.   Filed Weights   07/06/20 1356  Weight: 60.3 kg (133 lb)    Body mass index is 27.8 kg/m.  Estimated Creatinine Clearance: 22.5 mL/min (A) (by C-G formula based on SCr of 1.53 mg/dL (H)).  Patient is candidate for enoxaparin 30mg  every 24 hours based on CrCl <85ml/min or Weight <45kg  RECOMMENDATION: Pharmacy has adjusted enoxaparin dose per [facility] policy.  Patient is now receiving enoxaparin 30 mg every 24 hours    Darnelle Bos, PharmD Clinical Pharmacist  07/06/2020 5:12 PM

## 2020-07-06 NOTE — Progress Notes (Signed)
Assume care @ 1900. Pt appeared mildly agitated. After providing comfort and explanation of care. Pt. Accepted IV fluids and medication Ordered. Pt. Husband arrived @1930  to stay overnight with her. He  appeared as if he will need physical assistance. Pt. Husband was escorted to unit in a chair. Pt. Reported that he will not require any physical assistance while staying and providing support to his wife.

## 2020-07-06 NOTE — Progress Notes (Signed)
Patient  became very combative when I entered the room to give her remdesivir, IV fluids, and monitor.  I   called the daughter Sylvia Miller, I called the daughter Sylvia Miller and I called the husband Sylvia Miller.  No one would answer the phone I left several messages.  Sylvia Miller did call because the father emailed her and stated that we had kicked him out of the hospital and he did not know what to do.  Daughter called patient in the room and convinced her to let us give her IV fluids and medication.   We were also able to attach the monitor.  MD made aware of above situation

## 2020-07-06 NOTE — ED Notes (Signed)
Husband at bedside.  

## 2020-07-06 NOTE — Plan of Care (Signed)

## 2020-07-06 NOTE — ED Notes (Signed)
Sent floor message regarding pt

## 2020-07-06 NOTE — ED Notes (Signed)
Pt transported via ED tech

## 2020-07-06 NOTE — H&P (Addendum)
History and Physical    Sylvia Miller FFM:[id number] DOB: Jan 23, 1941 DOA: 07/06/2020  PCP: Dion Body, MD   Patient coming from: Home  I have personally briefly reviewed patient's old medical records in [place]  Chief Complaint: Poor oral intake/weakness   Most of the history is obtained from patient's husband at the bedside.  HPI: Sylvia Miller is a 80 y.o. female with medical history significant for myelodysplastic syndrome, GERD, dementia, rheumatoid arthritis, hypertension who was brought into the ER by her husband for evaluation of multiple symptoms which include poor oral intake for about 3 days, increased weakness as well as increased lethargy. He states that at baseline patient usually ambulates in the house without the use of an assist device but over the last 3 days she has been very weak and has been in bed refusing to get out of bed or walk.  He also states that she has refused most of her meals and has also been refusing her medications.  I am unable to review of systems on this patient due to her underlying dementia. She went to see her primary care provider who had ordered labs which showed glucose 124, sodium 137, potassium 4.0, chloride 104, bicarb 24.9, BUN 44, creatinine 1.6, AST 23, ALT 13, alk phos 126, albumin 4.2, white count 7.0, hemoglobin 12.3, hematocrit 37.1, MCV 102.2, RDW 12.7, platelet count 135 Patient's SARS coronavirus 2 point-of-care test is positive. Chest x-ray reviewed by me shows no acute findings Twelve-lead EKG reviewed by me shows sinus rhythm   ED Course: Patient is an 80 year old Caucasian female who was sent to the emergency room by her primary care provider for evaluation of dehydration and poor oral intake.  Patient noted to have worsening of her renal function from baseline, 0.97 >> 1.53.  Her SARS coronavirus 2 point-of-care test is also positive but patient is vaccinated and has received 2 booster shots.  She will be admitted  to the hospital for further evaluation.   Review of Systems: As per HPI otherwise all other systems reviewed and negative.    Past Medical History:  Diagnosis Date  . Allergy   . Anemia   . Arthritis    rheumatoid /knees/hands/shoulders (Right shoulder) infusion every 5 weeks  . Bladder incontinence   . Chicken pox   . Dyspnea    with exertion or far walking  . Gastric ulcer   . GERD (gastroesophageal reflux disease)   . Heart murmur   . Hypercholesteremia   . Hyperlipidemia, unspecified   . Hypertension    controlled with meds;   Marland Kitchen MDS (myelodysplastic syndrome) ([place]) 2000  . Memory deficit   . Muscular dystrophy ([place])    per patient, this is incorrect  . Myelodysplastic syndrome ([place])   . Myelodysplastic syndrome ([place])   . Rheumatoid arthritis ([place])   . Ulcer     Past Surgical History:  Procedure Laterality Date  . ABDOMINAL HYSTERECTOMY    . APPENDECTOMY     as a child  . CHOLECYSTECTOMY    . COLONOSCOPY    . COLONOSCOPY WITH PROPOFOL N/A 04/24/2017   Procedure: COLONOSCOPY WITH PROPOFOL;  Surgeon: Lollie Sails, MD;  Location[facility] ENDOSCOPY;  Service: Endoscopy;  Laterality: N/A;  . ESOPHAGOGASTRODUODENOSCOPY (EGD) WITH PROPOFOL N/A 04/24/2017   Procedure: ESOPHAGOGASTRODUODENOSCOPY (EGD) WITH PROPOFOL;  Surgeon: Lollie Sails, MD;  Location: [facility] ENDOSCOPY;  Service: Endoscopy;  Laterality: N/A;  . JOINT REPLACEMENT    . KNEE ARTHROPLASTY Right 02/21/2017   Procedure:  COMPUTER ASSISTED TOTAL KNEE ARTHROPLASTY;  Surgeon: Dereck Leep, MD;  Location: [facility] ORS;  Service: Orthopedics;  Laterality: Right;  . KNEE ARTHROPLASTY Left 08/06/2017   Procedure: COMPUTER ASSISTED TOTAL KNEE ARTHROPLASTY;  Surgeon: Dereck Leep, MD;  Location: [facility] ORS;  Service: Orthopedics;  Laterality: Left;  . NM INTRAVENOUS INJECTION ([facility] HX)     receives biologic infusions every 5 weeks for rheumatoid arthritis. followed by dr. Meda Coffee at [facility]  . TONSILLECTOMY     . UPPER GASTROINTESTINAL ENDOSCOPY    . WISDOM TOOTH EXTRACTION       reports that she has never smoked. She has never used smokeless tobacco. She reports current alcohol use. She reports that she does not use drugs.  Allergies  Allergen Reactions  . Pollen Extract Other (See Comments)    Nose runs, eyes itch, etc    Family History  Problem Relation Age of Onset  . Cancer Mother   . Lung cancer Mother   . Lung cancer Father   . Colon cancer Neg Hx   . Colon polyps Neg Hx   . Rectal cancer Neg Hx       Prior to Admission medications   Medication Sig Start Date End Date Taking? Authorizing Provider  acetaminophen (TYLENOL) 325 MG tablet Take 650 mg by mouth every 6 (six) hours as needed for moderate pain or headache.    Yes [provider]  albuterol (VENTOLIN HFA) 108 (90 Base) MCG/ACT inhaler Inhale 2 puffs into the lungs every 6 (six) hours as needed for wheezing or shortness of breath. 05/24/19  Yes Merlyn Lot, MD  Artificial Tear Solution (GENTEAL TEARS) 0.1-0.2-0.3 % SOLN Place 1 drop into both eyes daily as needed (dry eye).    Yes [provider]  aspirin EC 81 MG tablet Take 81 mg by mouth daily.   Yes [provider]  atorvastatin (LIPITOR) 10 MG tablet Take 5 mg by mouth daily. In the morning   Yes [provider]  diclofenac sodium (VOLTAREN) 1 % GEL Apply 2 g topically 4 (four) times daily as needed (pain). Apply to painful joint   Yes [provider]  hydrochlorothiazide (HYDRODIURIL) 12.5 MG tablet Take 12.5 mg by mouth daily. 04/27/20  Yes [provider]  lisinopril (ZESTRIL) 20 MG tablet Take 20 mg by mouth daily. 05/22/19  Yes [provider]  meloxicam (MOBIC) 7.5 MG tablet Take 7.5 mg by mouth daily as needed. 04/15/18  Yes [provider]  memantine (NAMENDA) 10 MG tablet Take 10 mg by mouth 2 (two) times daily. 05/12/19  Yes [provider]  Menthol, Topical Analgesic,  (BIOFREEZE EX) Apply 1 application topically as needed (for knee/leg pain).   Yes [provider]  mirtazapine (REMERON) 7.5 MG tablet Take 7.5 mg by mouth at bedtime. 06/06/20  Yes [provider]  Multiple Vitamins-Minerals (OCUVITE PO) Take 0.5 tablets by mouth in the morning and at bedtime.   Yes [provider]  oxybutynin (DITROPAN-XL) 10 MG 24 hr tablet Take 10 mg by mouth daily. 12/27/16  Yes [provider]  gabapentin (NEURONTIN) 300 MG capsule Take 1 capsule (300 mg total) by mouth at bedtime. Patient not taking: Reported on 07/06/2020 08/08/17   Reche Dixon, PA-C    Physical Exam: Vitals:   07/06/20 1356 07/06/20 1500 07/06/20 1615  BP: (!) 156/84 (!) 110/46   Pulse: 68 (!) 58 90  Resp: 17  18  Temp: 98.2 F (36.8 C)  98  F (36.7 C)  TempSrc: Oral  Oral  SpO2: 100% 99% 95%  Weight: 60.3 kg    Height: '4\' 10"'  (1.473 m)       Vitals:   07/06/20 1356 07/06/20 1500 07/06/20 1615  BP: (!) 156/84 (!) 110/46   Pulse: 68 (!) 58 90  Resp: 17  18  Temp: 98.2 F (36.8 C)  98 F (36.7 C)  TempSrc: Oral  Oral  SpO2: 100% 99% 95%  Weight: 60.3 kg    Height: '4\' 10"'  (1.473 m)        Constitutional: Alert and oriented x 3 . Not in any apparent distress HEENT:      Head: Normocephalic and atraumatic.         Eyes: PERLA, EOMI, Conjunctivae are normal. Sclera is non-icteric.       Mouth/Throat: Mucous membranes are moist.       Neck: Supple with no signs of meningismus. Cardiovascular: Regular rate and rhythm. No murmurs, gallops, or rubs. 2+ symmetrical distal pulses are present . No JVD. No  LE edema Respiratory: Respiratory effort normal .Lungs sounds clear bilaterally. No wheezes, crackles, or rhonchi.  Gastrointestinal: Soft, non tender, and non distended with positive bowel sounds.  Genitourinary: No CVA tenderness. Musculoskeletal: Nontender with normal range of motion in all extremities. No cyanosis, or erythema of extremities.   Swan-neck deformities of her fingers Neurologic:  Face is symmetric. Moving all extremities. No gross focal neurologic deficits.  Generalized weakness Skin: Skin is warm, dry.  No rash or ulcers Psychiatric: Mood and affect are normal    Labs on Admission: I have personally reviewed following labs and imaging studies  CBC: No results for input(s): WBC, NEUTROABS, HGB, HCT, MCV, PLT in the last 168 hours. Basic Metabolic Panel: Recent Labs  Lab 07/06/20 1526  NA 135  K 4.9  CL 106  CO2 19*  GLUCOSE 93  BUN 46*  CREATININE 1.53*  CALCIUM 8.5*  MG 2.1   GFR: Estimated Creatinine Clearance: 22.5 mL/min (A) (by C-G formula based on SCr of 1.53 mg/dL (H)). Liver Function Tests: No results for input(s): AST, ALT, ALKPHOS, BILITOT, PROT, ALBUMIN in the last 168 hours. No results for input(s): LIPASE, AMYLASE in the last 168 hours. No results for input(s): AMMONIA in the last 168 hours. Coagulation Profile: No results for input(s): INR, PROTIME in the last 168 hours. Cardiac Enzymes: No results for input(s): CKTOTAL, CKMB, CKMBINDEX, TROPONINI in the last 168 hours. BNP (last 3 results) No results for input(s): PROBNP in the last 8760 hours. HbA1C: No results for input(s): HGBA1C in the last 72 hours. CBG: No results for input(s): GLUCAP in the last 168 hours. Lipid Profile: No results for input(s): CHOL, HDL, LDLCALC, TRIG, CHOLHDL, LDLDIRECT in the last 72 hours. Thyroid Function Tests: Recent Labs    07/06/20 1526  TSH 0.683   Anemia Panel: No results for input(s): VITAMINB12, FOLATE, FERRITIN, TIBC, IRON, RETICCTPCT in the last 72 hours. Urine analysis:    Component Value Date/Time   COLORURINE YELLOW (A) 07/06/2020 1643   APPEARANCEUR CLEAR (A) 07/06/2020 1643   LABSPEC 1.014 07/06/2020 1643   PHURINE 5.0 07/06/2020 1643   GLUCOSEU NEGATIVE 07/06/2020 1643   HGBUR NEGATIVE 07/06/2020 [place] 07/06/2020 1643   KETONESUR NEGATIVE 07/06/2020  1643   PROTEINUR NEGATIVE 07/06/2020 1643   NITRITE NEGATIVE 07/06/2020 [place] 07/06/2020 1643    Radiological Exams on Admission: DG Chest Portable 1 View  Result Date:  07/06/2020 CLINICAL DATA:  Recent weight loss, dehydration EXAM: PORTABLE CHEST 1 VIEW COMPARISON:  05/24/2019 FINDINGS: Single frontal view of the chest demonstrates an unremarkable cardiac silhouette. No acute airspace disease, effusion, or pneumothorax. Marked degenerative changes of the bilateral shoulders. No acute bony abnormalities. IMPRESSION: 1. No acute intrathoracic process. Electronically Signed   By: Randa Ngo M.D.   On: 07/06/2020 15:26     Assessment/Plan Principal Problem:   AKI (acute kidney injury) ([place]) Active Problems:   Essential hypertension   MDS (myelodysplastic syndrome) (H[facility])   Rheumatoid arthritis involving multiple sites with positive rheumatoid factor ([place])   COVID-19 virus detected   Dementia without behavioral disturbance (HCC)    Acute kidney injury At baseline patient has a serum creatinine of 0.97 and on admission it is 1.53 Acute kidney injury secondary to dehydration from poor oral intake as well as concomitant diuretic use We will hold hydrochlorothiazide and lisinopril as well as NSAIDs Hydrate patient with normal saline and repeat electrolytes in a.m.    Hypertension Hold hydrochlorothiazide and lisinopril due to acute kidney injury We will place patient on low-dose amlodipine    COVID-19 virus detected Patient was brought into the ER for evaluation of generalized weakness, anorexia and poor oral intake. She has been afebrile Her SARS coronavirus point-of-care test is positive Patient received 2 doses of COVID-19 vaccine and has received 2 booster shots as well She is not hypoxic and chest x-ray does not show evidence of pneumonia Ideally patient will benefit from administration of monoclonal antibody (but that is unavailable inpatient) We  will place patient on remdesivir per protocol (discussed treatment with husband who is in agreement) Monitor inflammatory markers    Dementia Continue Namenda    Myelodysplastic syndrome Stable    History of rheumatoid arthritis Continue pain control as needed     DVT prophylaxis: Lovenox Code Status: full code Family Communication: Greater than 50% of time was spent discussing patient's condition and plan of care with her husband who was at the bedside.  All questions and concerns have been addressed.  He verbalizes understanding and agrees with the plan.  CODE STATUS was discussed and she is full code. Disposition Plan: Back to previous home environment Consults called: Physical therapy Status: At the time of admission, admission status for this patient is inpatient. This is judged to be reasonable and necessary required intensity of service to ensure the patient's safety given the presenting symptoms, physical exam findings, and initial radiographic and laboratory data in the context of their comorbid conditions.   Patient requires inpatient status due to high intensity of service, high risk for further deterioration and high frequency of surveillance required.    Collier Bullock MD [facility]     07/06/2020, 5:38 PM

## 2020-07-06 NOTE — ED Notes (Signed)
Called lab to draw blood.

## 2020-07-07 DIAGNOSIS — G301 Alzheimer's disease with late onset: Secondary | ICD-10-CM

## 2020-07-07 DIAGNOSIS — U071 COVID-19: Secondary | ICD-10-CM

## 2020-07-07 DIAGNOSIS — F0280 Dementia in other diseases classified elsewhere without behavioral disturbance: Secondary | ICD-10-CM

## 2020-07-07 LAB — CBC WITH DIFFERENTIAL/PLATELET
Abs Immature Granulocytes: 0.03 10*3/uL (ref 0.00–0.07)
Basophils Absolute: 0 10*3/uL (ref 0.0–0.1)
Basophils Relative: 0 %
Eosinophils Absolute: 0 10*3/uL (ref 0.0–0.5)
Eosinophils Relative: 0 %
HCT: 32.1 % — ABNORMAL LOW (ref 36.0–46.0)
Hemoglobin: 10.6 g/dL — ABNORMAL LOW (ref 12.0–15.0)
Immature Granulocytes: 1 %
Lymphocytes Relative: 31 %
Lymphs Abs: 1.9 10*3/uL (ref 0.7–4.0)
MCH: 33.3 pg (ref 26.0–34.0)
MCHC: 33 g/dL (ref 30.0–36.0)
MCV: 100.9 fL — ABNORMAL HIGH (ref 80.0–100.0)
Monocytes Absolute: 0.5 10*3/uL (ref 0.1–1.0)
Monocytes Relative: 8 %
Neutro Abs: 3.7 10*3/uL (ref 1.7–7.7)
Neutrophils Relative %: 60 %
Platelets: 127 10*3/uL — ABNORMAL LOW (ref 150–400)
RBC: 3.18 MIL/uL — ABNORMAL LOW (ref 3.87–5.11)
RDW: 12.8 % (ref 11.5–15.5)
WBC: 6.2 10*3/uL (ref 4.0–10.5)
nRBC: 0 % (ref 0.0–0.2)

## 2020-07-07 LAB — BASIC METABOLIC PANEL
Anion gap: 8 (ref 5–15)
BUN: 33 mg/dL — ABNORMAL HIGH (ref 8–23)
CO2: 17 mmol/L — ABNORMAL LOW (ref 22–32)
Calcium: 8.3 mg/dL — ABNORMAL LOW (ref 8.9–10.3)
Chloride: 113 mmol/L — ABNORMAL HIGH (ref 98–111)
Creatinine, Ser: 1.06 mg/dL — ABNORMAL HIGH (ref 0.44–1.00)
GFR, Estimated: 53 mL/min — ABNORMAL LOW (ref >60)
Glucose, Bld: 78 mg/dL (ref 70–99)
Potassium: 3.8 mmol/L (ref 3.5–5.1)
Sodium: 138 mmol/L (ref 135–145)

## 2020-07-07 LAB — D-DIMER, QUANTITATIVE: D-Dimer, Quant: 0.76 ug/mL-FEU — ABNORMAL HIGH (ref 0.00–0.50)

## 2020-07-07 LAB — FERRITIN: Ferritin: 311 ng/mL — ABNORMAL HIGH (ref 11–307)

## 2020-07-07 LAB — C-REACTIVE PROTEIN: CRP: 1.6 mg/dL — ABNORMAL HIGH (ref <1.0)

## 2020-07-07 LAB — PHOSPHORUS: Phosphorus: 3.6 mg/dL (ref 2.5–4.6)

## 2020-07-07 LAB — MAGNESIUM: Magnesium: 1.9 mg/dL (ref 1.7–2.4)

## 2020-07-07 MED ORDER — HALOPERIDOL 1 MG PO TABS
1.0000 mg | ORAL_TABLET | Freq: Four times a day (QID) | ORAL | Status: DC | PRN
  Administered 2020-07-07: 1 mg via ORAL
  Filled 2020-07-07 (×3): qty 1

## 2020-07-07 MED ORDER — ADULT MULTIVITAMIN W/MINERALS CH
1.0000 | ORAL_TABLET | Freq: Every day | ORAL | Status: DC
  Administered 2020-07-08: 1 via ORAL
  Filled 2020-07-07: qty 1

## 2020-07-07 MED ORDER — ENOXAPARIN SODIUM 40 MG/0.4ML IJ SOSY
40.0000 mg | PREFILLED_SYRINGE | INTRAMUSCULAR | Status: DC
  Administered 2020-07-07: 40 mg via SUBCUTANEOUS
  Filled 2020-07-07: qty 0.4

## 2020-07-07 MED ORDER — SODIUM BICARBONATE 8.4 % IV SOLN
INTRAVENOUS | Status: AC
  Filled 2020-07-07: qty 150

## 2020-07-07 MED ORDER — ENSURE ENLIVE PO LIQD
237.0000 mL | Freq: Three times a day (TID) | ORAL | Status: DC
  Administered 2020-07-07 – 2020-07-08 (×3): 237 mL via ORAL

## 2020-07-07 MED ORDER — LISINOPRIL 20 MG PO TABS
20.0000 mg | ORAL_TABLET | Freq: Every day | ORAL | Status: DC
  Administered 2020-07-07 – 2020-07-08 (×2): 20 mg via ORAL
  Filled 2020-07-07 (×2): qty 1

## 2020-07-07 NOTE — Progress Notes (Signed)
Patient requiring Tele-Sitter.  Husband left close to noon to check on his Covid Status.  He tested positive and won't be able to come back.  Patient required constant reminders of her status and required constant help to move safely in room to get to [facility] or to help with activities.

## 2020-07-07 NOTE — Evaluation (Signed)
Occupational Therapy Evaluation Patient Details Name: Sylvia Miller MRN: [id number] DOB: Jun 24, 1940 Today's Date: 07/07/2020    History of Present Illness 80 y.o. female with medical history significant for myelodysplastic syndrome, GERD, dementia, rheumatoid arthritis, hypertension who was brought into the ER by her husband for evaluation of multiple symptoms which include poor oral intake for about 3 days, increased weakness as well as increased lethargy.  He states that at baseline patient usually ambulates in the house without the use of an assist device but over the last 3 days she has been very weak and has been in bed refusing to get out of bed or walk.  He also states that she has refused most of her meals and has also been refusing her medications. Pt positive for COVID.   Clinical Impression   Patient presenting with decreased I in self care, balance, functional mobility/transfers, endurance, and safety awareness.  Patient lives at home with husband PTA. Pt does not endorse using AD for functional mobility but does bath and dress self with intermittent supervision. Pt taking sink baths mostly. Husband takes pt to appointments and use of wheelchair for community distances. Patient currently functioning at supervision - min guard with out use of AD for ambulation, toilet transfer, and LB dressing tasks. Pt is cognitively impaired at baseline. Min cuing for technique and safety this session.  Patient will benefit from acute OT to increase overall independence in the areas of ADLs, functional mobility, and safety awareness in order to safely discharge home with family.    Follow Up Recommendations  Home health OT;Supervision/Assistance - 24 hour    Equipment Recommendations  None recommended by OT       Precautions / Restrictions Precautions Precautions: Fall      Mobility Bed Mobility Overal bed mobility: Needs Assistance Bed Mobility: Supine to Sit;Sit to Supine     Supine to sit:  Supervision Sit to supine: Supervision   General bed mobility comments: min cuing for technique and safety    Transfers Overall transfer level: Needs assistance Equipment used: 1 person hand held assist Transfers: Sit to/from Stand;Stand Pivot Transfers Sit to Stand: Supervision Stand pivot transfers: Min guard       General transfer comment: min guard for safety    Balance Overall balance assessment: Needs assistance Sitting-balance support: Bilateral upper extremity supported;Feet supported Sitting balance-Leahy Scale: Good     Standing balance support: During functional activity Standing balance-Leahy Scale: Fair                             ADL either performed or assessed with clinical judgement   ADL Overall ADL's : Needs assistance/impaired                         Toilet Transfer: Min guard;BSC   Toileting- Clothing Manipulation and Hygiene: Min guard;Sit to/from stand               Vision Patient Visual Report: No change from baseline       Perception     Praxis      Pertinent Vitals/Pain Pain Assessment: Faces Faces Pain Scale: No hurt     Hand Dominance Right   Extremity/Trunk Assessment Upper Extremity Assessment Upper Extremity Assessment: Generalized weakness   Lower Extremity Assessment Lower Extremity Assessment: Defer to PT evaluation   Cervical / Trunk Assessment Cervical / Trunk Assessment: Kyphotic   Communication Communication Communication: No difficulties  Cognition Arousal/Alertness: Awake/alert Behavior During Therapy: Restless Overall Cognitive Status: History of cognitive impairments - at baseline                                 General Comments: Pt corrected several times by husband when therapist asking home set up questions. Pt was cooperative and followed commands with increased time.   General Comments       Exercises     Shoulder Instructions      Home Living  Family/patient expects to be discharged to:: Private residence Living Arrangements: Spouse/significant other Available Help at Discharge: Family;Available 24 hours/day Type of Home: House Home Access: Level entry     Home Layout: Multi-level;Able to live on main level with bedroom/bathroom     Bathroom Shower/Tub: Tub/shower unit;Walk-in shower   Bathroom Toilet: Handicapped height Bathroom Accessibility: Yes How Accessible: Accessible via walker Home Equipment: [place] - 4 wheels;Shower seat;Grab bars - toilet;Walker - 2 wheels;Hand held shower head;Adaptive equipment;Wheelchair - manual;Cane - single point [employer]: [place]        Prior Functioning/Environment Level of Independence: Independent with assistive device(s)        Comments: Pt ambulates short distances only in home without use of AD. Pt performs bathing at sink or sitting on edge of tub. Family always present to assist as needed. Pt uses wheelchair if leaving home.        OT Problem List: Decreased strength;Impaired balance (sitting and/or standing);Decreased activity tolerance;Decreased cognition;Decreased safety awareness;Cardiopulmonary status limiting activity;Decreased knowledge of use of DME or AE;Decreased coordination      OT Treatment/Interventions: Self-care/ADL training;DME and/or AE instruction;Therapeutic activities;Balance training;Therapeutic exercise;Energy conservation;Patient/family education;Modalities;Cognitive remediation/compensation;Manual therapy    OT Goals(Current goals can be found in the care plan section) Acute Rehab OT Goals Patient Stated Goal: to go home and get better OT Goal Formulation: With patient/family Time For Goal Achievement: 07/21/20 Potential to Achieve Goals: Good ADL Goals Pt Will Perform Grooming: (P) with modified independence Pt Will Perform Lower Body Dressing: (P) with modified independence Pt Will Transfer to Toilet: (P) with modified  independence Pt Will Perform Toileting - Clothing Manipulation and hygiene: (P) with modified independence  OT Frequency: Min 2X/week   Barriers to D/C:    none known at this time          AM-PAC OT "6 Clicks" Daily Activity     Outcome Measure Help from another person eating meals?: None Help from another person taking care of personal grooming?: None Help from another person toileting, which includes using toliet, bedpan, or urinal?: A Little Help from another person bathing (including washing, rinsing, drying)?: A Little Help from another person to put on and taking off regular upper body clothing?: None Help from another person to put on and taking off regular lower body clothing?: A Little 6 Click Score: 21   End of Session Equipment Utilized During Treatment: Oxygen Nurse Communication: Mobility status  Activity Tolerance: Patient tolerated treatment well Patient left: in bed;with call bell/phone within reach;with family/visitor present;with bed alarm set  OT Visit Diagnosis: Unsteadiness on feet (R26.81);Repeated falls (R29.6);Muscle weakness (generalized) (M62.81)                Time: 9563-8756 OT Time Calculation (min): 23 min Charges:  OT General Charges $OT Visit: 1 Visit OT Evaluation $OT Eval Moderate Complexity: 1 Mod OT Treatments $Self Care/Home Management : 8-22 mins  Darleen Crocker, MS, OTR/L , CBIS  ascom [phone]  07/07/20, 12:54 PM

## 2020-07-07 NOTE — TOC Initial Note (Signed)
Transition of Care [facility]) - Initial/Assessment Note    Patient Details  Name: Sylvia Miller MRN: [id number] Date of Birth: 1940-12-14  [facility]) CM/SW Contact:    Beverly Sessions, RN Phone Number: 07/07/2020, 4:16 PM  Clinical Narrative:                 Patient admitted for AKI Covid positive Patient with history of dementia.   Completed assessment via phone with husband  Patient lives at home with husband. PCP [place] - Husband provides Engineer, civil (consulting) - denies issues obtaining medications  PT and OT recommend home health. Husband in agreement. States he does not have a preference of home health agency.  Referral made and accepted by Corene Cornea with [facility]    Patient has [place] Rile - 4 wheels;Shower seat;Grab bars - toilet;Walker - 2 wheels;Hand held shower head;Adaptive equipment;Wheelchair - manual;Cane - single point  Expected Discharge Plan: [place] Barriers to Discharge: Continued Medical Work up   Patient Goals and CMS Choice     Choice offered to / list presented to : Spouse  Expected Discharge Plan and Services Expected Discharge Plan: [place] Acute Care Choice: [place]: PT,RN,OT [facility] Agency: [place] ([place]) Date [facility] Agency Contacted: 07/07/20   Representative spoke with at [place]: Corene Cornea  Prior Living Arrangements/Services   Lives with:: Spouse Patient language and need for interpreter reviewed:: Yes Do you feel safe going back to the place where you live?: Yes      Need for Family Participation in Patient Care: Yes (Comment) Care giver support system in place?: Yes (comment) Current home services: DME Criminal Activity/Legal Involvement Pertinent to Current Situation/Hospitalization: No - Comment as needed  Activities of Daily Living Home Assistive Devices/Equipment: None ADL Screening  (condition at time of admission) Patient's cognitive ability adequate to safely complete daily activities?: Yes Is the patient deaf or have difficulty hearing?: No Does the patient have difficulty seeing, even when wearing glasses/contacts?: No Does the patient have difficulty concentrating, remembering, or making decisions?: Yes Patient able to express need for assistance with ADLs?: No Does the patient have difficulty dressing or bathing?: No Independently performs ADLs?: Yes (appropriate for developmental age) Does the patient have difficulty walking or climbing stairs?: No Weakness of Legs: None Weakness of Arms/Hands: None  Permission Sought/Granted                  Emotional Assessment           Psych Involvement: No (comment)  Admission diagnosis:  AKI (acute kidney injury) ([place]) [N17.9] Patient Active Problem List   Diagnosis Date Noted  . AKI (acute kidney injury) ([employer]) 07/06/2020  . COVID-19 virus detected 07/06/2020  . Dementia without behavioral disturbance ([place]) 07/06/2020  . Protein-calorie malnutrition ([place]) 08/27/2017  . 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 Cubital canal compression syndrome, right 04/18/2017  . Primary osteoarthritis of knee 03/25/2017  . 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 Pure hypercholesterolemia 08/17/2016  . MDS (myelodysplastic syndrome) ([place]) 05/12/2016  . B12 deficiency 05/04/2016  . Essential hypertension 05/04/2016  . Mixed stress and urge urinary incontinence 05/04/2016  . Rheumatoid arthritis involving multiple sites with positive rheumatoid factor ([place]) 05/04/2016   PCP:  Dion Body, MD Pharmacy:   [facility] [phone] [address], [place] [address] Phone: [phone] Fax: [phone]     Social Determinants of Health (SDOH) Interventions    Readmission Risk Interventions No flowsheet  data found.

## 2020-07-07 NOTE — Progress Notes (Signed)
PHARMACIST - PHYSICIAN COMMUNICATION  CONCERNING:  Enoxaparin (Lovenox) for DVT Prophylaxis    RECOMMENDATION: Patient was prescribed enoxaprin 30mg  q24 hours for VTE prophylaxis.   Filed Weights   07/06/20 1356  Weight: 60.3 kg (133 lb)    Body mass index is 27.8 kg/m.  Estimated Creatinine Clearance: 32.5 mL/min (A) (by C-G formula based on SCr of 1.06 mg/dL (H)).   Patient is candidate for enoxaparin 40mg  every 24 hours based on CrCl >45ml/min   DESCRIPTION: Pharmacy has adjusted enoxaparin dose per [facility] policy.  Patient is now receiving enoxaparin 40 mg every 24 hours    Lu Duffel, PharmD, BCPS Clinical Pharmacist 07/07/2020 7:22 AM

## 2020-07-07 NOTE — Progress Notes (Signed)
PROGRESS NOTE    Sylvia Miller  UUV:[id number] DOB: June 22, 1940 DOA: 07/06/2020 PCP: Dion Body, MD   Chief complaint.  Generalized weakness. Brief Narrative:  Sylvia Miller is a 80 y.o. female with medical history significant for myelodysplastic syndrome, GERD, dementia, rheumatoid arthritis, hypertension who was brought into the ER by her husband for evaluation of multiple symptoms which include poor oral intake for about 3 days, increased weakness as well as increased lethargy. She was a found to have acute kidney injury, was given fluids.  Patient was also positive for COVID, started on remdesivir.  But the patient does not have any hypoxemia or evidence of pneumonia on chest x-ray   Assessment & Plan:   Principal Problem:   AKI (acute kidney injury) ([place]) Active Problems:   Essential hypertension   MDS (myelodysplastic syndrome) (H[facility])   Rheumatoid arthritis involving multiple sites with positive rheumatoid factor ([place])   COVID-19 virus detected   Dementia without behavioral disturbance ([place])  #1. Acute kidney injury secondary to dehydration. Metabolic acidosis. Patient renal function seem to be better today, still has significant metabolic acidosis.  I will start sodium bicarb drip.  Recheck BMP tomorrow. Currently patient does not have any volume overload.  2.  COVID-19 infection. No evidence of pneumonia. Condition stable, no hypoxemia.  3.  Essential hypertension. Hydrochlorothiazide on hold.  Resumed lisinopril.  4.  Myelodysplastic syndrome. Thrombocytopenia. Stable.  5.  Dementia.     DVT prophylaxis: Lovenox Code Status: Full Family Communication: Husband at bedside. Disposition Plan:  .   Status is: Inpatient  Remains inpatient appropriate because:Inpatient level of care appropriate due to severity of illness   Dispo: The patient is from: Home              Anticipated d/c is to: Home              Patient currently is not medically stable to  d/c.   Difficult to place patient No        I/O last 3 completed shifts: In: 731.6 [I.V.:731.6] Out: -  No intake/output data recorded.     Consultants:   None  Procedures: None  Antimicrobials: None Subjective: Patient states that she had 5 loose stools yesterday, seem to be better today.  Still poor appetite, no nausea vomiting.  She has reduced urine output today. Denies any short of breath or cough. No dysuria hematuria. No fever or chills. No chest pain or palpitation. No headache or dizziness.  Objective: Vitals:   07/06/20 2054 07/07/20 0619 07/07/20 0950 07/07/20 1214  BP: 140/69 (!) 145/79 (!) 160/84 (!) 170/63  Pulse: 90 82 78 66  Resp: 16 16 20 14   Temp: 98.3 F (36.8 C) 98.5 F (36.9 C) 97.9 F (36.6 C) 98.3 F (36.8 C)  TempSrc: Oral Oral Oral Oral  SpO2: 100% 99% 99% 100%  Weight:      Height:        Intake/Output Summary (Last 24 hours) at 07/07/2020 1343 Last data filed at 07/07/2020 0319 Gross per 24 hour  Intake 731.6 ml  Output --  Net 731.6 ml   Filed Weights   07/06/20 1356  Weight: 60.3 kg    Examination:  General exam: Appears calm and comfortable  Respiratory system: Clear to auscultation. Respiratory effort normal. Cardiovascular system: S1 & S2 heard, RRR. No JVD, murmurs, rubs, gallops or clicks. No pedal edema. Gastrointestinal system: Abdomen is nondistended, soft and nontender. No organomegaly or masses felt. Normal bowel sounds heard. Central nervous  system: Alert and oriented x2. No focal neurological deficits. Extremities: Symmetric 5 x 5 power. Skin: No rashes, lesions or ulcers Psychiatry: Mood & affect appropriate.     Data Reviewed: I have personally reviewed following labs and imaging studies  CBC: Recent Labs  Lab 07/07/20 0512  WBC 6.2  NEUTROABS 3.7  HGB 10.6*  HCT 32.1*  MCV 100.9*  PLT 027*   Basic Metabolic Panel: Recent Labs  Lab 07/06/20 1526 07/07/20 0512  NA 135 138  K 4.9 3.8  CL  106 113*  CO2 19* 17*  GLUCOSE 93 78  BUN 46* 33*  CREATININE 1.53* 1.06*  CALCIUM 8.5* 8.3*  MG 2.1 1.9  PHOS  --  3.6   GFR: Estimated Creatinine Clearance: 32.5 mL/min (A) (by C-G formula based on SCr of 1.06 mg/dL (H)). Liver Function Tests: No results for input(s): AST, ALT, ALKPHOS, BILITOT, PROT, ALBUMIN in the last 168 hours. No results for input(s): LIPASE, AMYLASE in the last 168 hours. No results for input(s): AMMONIA in the last 168 hours. Coagulation Profile: No results for input(s): INR, PROTIME in the last 168 hours. Cardiac Enzymes: Recent Labs  Lab 07/06/20 1734  CKTOTAL 44   BNP (last 3 results) No results for input(s): PROBNP in the last 8760 hours. HbA1C: No results for input(s): HGBA1C in the last 72 hours. CBG: No results for input(s): GLUCAP in the last 168 hours. Lipid Profile: No results for input(s): CHOL, HDL, LDLCALC, TRIG, CHOLHDL, LDLDIRECT in the last 72 hours. Thyroid Function Tests: Recent Labs    07/06/20 1526  TSH 0.683   Anemia Panel: Recent Labs    07/07/20 0512  FERRITIN 311*   Sepsis Labs: No results for input(s): PROCALCITON, LATICACIDVEN in the last 168 hours.  Recent Results (from the past 240 hour(s))  Resp Panel by RT-PCR (Flu A&B, Covid) Nasopharyngeal Swab     Status: Abnormal   Collection Time: 07/06/20  3:41 PM   Specimen: Nasopharyngeal Swab; Nasopharyngeal(NP) swabs in vial transport medium  Result Value Ref Range Status   SARS Coronavirus 2 by RT PCR POSITIVE (A) NEGATIVE Final    Comment: RESULT CALLED TO, READ BACK BY AND VERIFIED WITH: RUSELL AVENDANO @1644  07/06/20 MJU (NOTE) SARS-CoV-2 target nucleic acids are DETECTED.  The SARS-CoV-2 RNA is generally detectable in upper respiratory specimens during the acute phase of infection. Positive results are indicative of the presence of the identified virus, but do not rule out bacterial infection or co-infection with other pathogens not detected by the test.  Clinical correlation with patient history and other diagnostic information is necessary to determine patient infection status. The expected result is Negative.  Fact Sheet for Patients: EntrepreneurPulse.com.au  Fact Sheet for Healthcare Providers: IncredibleEmployment.be  This test is not yet approved or cleared by the [place] FDA and  has been authorized for detection and/or diagnosis of SARS-CoV-2 by FDA under an Emergency Use Authorization (EUA).  This EUA will remain in effect (meaning this test can be  used) for the duration of  the COVID-19 declaration under Section 564(b)(1) of the Act, 21 U.S.C. section 360bbb-3(b)(1), unless the authorization is terminated or revoked sooner.     Influenza A by PCR NEGATIVE NEGATIVE Final   Influenza B by PCR NEGATIVE NEGATIVE Final    Comment: (NOTE) The Xpert Xpress SARS-CoV-2/FLU/RSV plus assay is intended as an aid in the diagnosis of influenza from Nasopharyngeal swab specimens and should not be used as a sole basis for treatment. Nasal washings and  aspirates are unacceptable for Xpert Xpress SARS-CoV-2/FLU/RSV testing.  Fact Sheet for Patients: EntrepreneurPulse.com.au  Fact Sheet for Healthcare Providers: IncredibleEmployment.be  This test is not yet approved or cleared by the [place] FDA and has been authorized for detection and/or diagnosis of SARS-CoV-2 by FDA under an Emergency Use Authorization (EUA). This EUA will remain in effect (meaning this test can be used) for the duration of the COVID-19 declaration under Section 564(b)(1) of the Act, 21 U.S.C. section 360bbb-3(b)(1), unless the authorization is terminated or revoked.  Performed at [facility], [address] [place], [place] [address]          Radiology Studies: DG Chest Portable 1 View  Result Date: 07/06/2020 CLINICAL DATA:  Recent weight loss, dehydration  EXAM: PORTABLE CHEST 1 VIEW COMPARISON:  05/24/2019 FINDINGS: Single frontal view of the chest demonstrates an unremarkable cardiac silhouette. No acute airspace disease, effusion, or pneumothorax. Marked degenerative changes of the bilateral shoulders. No acute bony abnormalities. IMPRESSION: 1. No acute intrathoracic process. Electronically Signed   By: Randa Ngo M.D.   On: 07/06/2020 15:26        Scheduled Meds: . vitamin C  500 mg Oral Daily  . aspirin EC  81 mg Oral Daily  . atorvastatin  5 mg Oral Daily  . enoxaparin (LOVENOX) injection  40 mg Subcutaneous Q24H  . feeding supplement  237 mL Oral TID BM  . lisinopril  20 mg Oral Daily  . memantine  10 mg Oral BID  . [START ON 07/08/2020] multivitamin with minerals  1 tablet Oral Daily  . oxybutynin  10 mg Oral Daily  . zinc sulfate  220 mg Oral Daily   Continuous Infusions: . remdesivir 100 mg in NS 100 mL 100 mg (07/07/20 1006)  . sodium bicarbonate 150 mEq in D5W infusion       LOS: 1 day    Time spent: 32 minutes    Sharen Hones, MD [facility]   To contact the attending provider between 7A-7P or the covering provider during after hours 7P-7A, please log into the web site [email or web address] and access using universal [place] password for that web site. If you do not have the password, please call the hospital operator.  07/07/2020, 1:43 PM

## 2020-07-07 NOTE — Progress Notes (Signed)
Initial Nutrition Assessment  DOCUMENTATION CODES:   Not applicable  INTERVENTION:   Ensure Enlive po TID, each supplement provides 350 kcal and 20 grams of protein  Magic cup TID with meals, each supplement provides 290 kcal and 9 grams of protein  MVI po daily   Liberalize diet  Pt at high refeed risk; recommend monitor potassium, magnesium and phosphorus labs daily until stable  NUTRITION DIAGNOSIS:   Increased nutrient needs related to catabolic illness (COVID 19) as evidenced by estimated needs.  GOAL:   Patient will meet greater than or equal to 90% of their needs  MONITOR:   PO intake,Supplement acceptance,Labs,Weight trends,Skin,I & O's  REASON FOR ASSESSMENT:   Malnutrition Screening Tool    ASSESSMENT:   80 y.o. female with medical history significant for myelodysplastic syndrome, GERD, dementia, rheumatoid arthritis and hypertension who is admitted with COVID 19 and AKI   Met with pt in room today. Pt with dementia and is a limited historian so history obtained from pt's husband at bedside. Husband reports pt with decreased appetite and oral intake at baseline. Husband reports that patient does not eat a lot but that she does eat frequent small meals at baseline. Husband reports that over the past several weeks, pt has been having decreased oral intake at home and has been refusing to eat or drink anything. Pt has never been offered any supplements per husband report. RD discussed with pt and pt's husband the importance of adequate nutrition needed to preserve lean muscle. After much convincing, pt agrees to try strawberry Ensure in hospital. RD will add supplements and MVI to help pt meet his estimated needs. Per chart, pt is down 13lbs(9%) over the past 6 weeks; this is significant weight loss. Pt is likely at refeed risk.   Medications reviewed and include: vitamin C, aspirin, lovenox, MVI, zinc   Labs reviewed: K 3.8 wnl, BUN 33(H), creat 1.06(H), P 3.6 wnl,  Mg 1.9 wnl Hgb 10.6(L), Hct 32.1(L)  NUTRITION - FOCUSED PHYSICAL EXAM:  Flowsheet Row Most Recent Value  Orbital Region No depletion  Upper Arm Region Mild depletion  Thoracic and Lumbar Region No depletion  Buccal Region No depletion  Temple Region Mild depletion  Clavicle Bone Region Mild depletion  Clavicle and Acromion Bone Region Mild depletion  Scapular Bone Region No depletion  Dorsal Hand No depletion  Patellar Region Mild depletion  Anterior Thigh Region Mild depletion  Posterior Calf Region Mild depletion  Edema (RD Assessment) None  Hair Reviewed  Eyes Reviewed  Mouth Reviewed  Skin Reviewed  Nails Reviewed     Diet Order:   Diet Order            Diet regular Room service appropriate? Yes; Fluid consistency: Thin  Diet effective now                EDUCATION NEEDS:   Education needs have been addressed  Skin:  Skin Assessment: Reviewed RN Assessment  Last BM:  5/4- type 6  Height:   Ht Readings from Last 1 Encounters:  07/06/20 4' 10"  (1.473 m)    Weight:   Wt Readings from Last 1 Encounters:  07/06/20 60.3 kg    Ideal Body Weight:  44 kg  BMI:  Body mass index is 27.8 kg/m.  Estimated Nutritional Needs:   Kcal:  1400-1600kcal/day  Protein:  70-80g/day  Fluid:  1.3L/day  Koleen Distance MS, RD, LDN Please refer to [facility] for RD and/or RD on-call/weekend/after hours pager

## 2020-07-07 NOTE — Plan of Care (Signed)

## 2020-07-07 NOTE — Evaluation (Signed)
Physical Therapy Evaluation Patient Details Name: Ritaj Dullea MRN: [id number] DOB: 1941/02/11 Today's Date: 07/07/2020   History of Present Illness  80 y.o. female with medical history significant for myelodysplastic syndrome, GERD, dementia, rheumatoid arthritis, hypertension who was brought into the ER by her husband for evaluation of multiple symptoms which include poor oral intake for about 3 days, increased weakness as well as increased lethargy.  He states that at baseline patient usually ambulates in the house without the use of an assist device but over the last 3 days she has been very weak and has been in bed refusing to get out of bed or walk.  He also states that she has refused most of her meals and has also been refusing her medications. Pt positive for COVID.    Clinical Impression  Patient received with RN in room, patient sitting on side of bed, had just been up to use bathroom. Patient is agreeable to PT assessment. She performed sit to stand transfer with min guard. Ambulated 15 feet in room with min guard, holding to bed with one hand for support. Patient appears to be near baseline. She will benefit from continued skilled PT while here to improve strength and functional independence.     Follow Up Recommendations Home health PT    Equipment Recommendations  None recommended by PT    Recommendations for Other Services       Precautions / Restrictions Precautions Precautions: Fall Precaution Comments: low fall risk Restrictions Weight Bearing Restrictions: No      Mobility  Bed Mobility Overal bed mobility: Needs Assistance Bed Mobility: Supine to Sit;Sit to Supine     Supine to sit: Supervision Sit to supine: Supervision   General bed mobility comments: not assessed, patient sitting up on side of bed upon arrival    Transfers Overall transfer level: Needs assistance Equipment used: None Transfers: Sit to/from Stand Sit to Stand: Min guard Stand pivot  transfers: Min guard       General transfer comment: min guard for safety  Ambulation/Gait Ambulation/Gait assistance: Min guard Gait Distance (Feet): 15 Feet Assistive device: 1 person hand held assist Gait Pattern/deviations: Step-through pattern;Decreased step length - right;Decreased step length - left;Trunk flexed Gait velocity: decreased   General Gait Details: patient ambulated without ad, but holding to bed as she walked around bed to recliner  Stairs            Wheelchair Mobility    Modified Rankin (Stroke Patients Only)       Balance Overall balance assessment: Modified Independent;Needs assistance Sitting-balance support: Feet supported Sitting balance-Leahy Scale: Good     Standing balance support: Single extremity supported;During functional activity Standing balance-Leahy Scale: Fair Standing balance comment: patient holding to bed for support with ambulation                             Pertinent Vitals/Pain Pain Assessment: No/denies pain Faces Pain Scale: No hurt    Home Living Family/patient expects to be discharged to:: Private residence Living Arrangements: Spouse/significant other Available Help at Discharge: Family;Available 24 hours/day Type of Home: House Home Access: Level entry     Home Layout: Multi-level;Able to live on main level with bedroom/bathroom Home Equipment: Walker - 4 wheels;Shower seat;Grab bars - toilet;Walker - 2 wheels;Hand held shower head;Adaptive equipment;Wheelchair - manual;Cane - single point      Prior Function Level of Independence: Independent with assistive device(s)  Comments: Pt ambulates short distances only in home without use of AD. Pt performs bathing at sink or sitting on edge of tub. Family always present to assist as needed. Pt uses wheelchair if leaving home.     Hand Dominance   Dominant Hand: Right    Extremity/Trunk Assessment   Upper Extremity Assessment Upper  Extremity Assessment: Generalized weakness;Defer to OT evaluation    Lower Extremity Assessment Lower Extremity Assessment: Generalized weakness    Cervical / Trunk Assessment Cervical / Trunk Assessment: Kyphotic  Communication   Communication: No difficulties  Cognition Arousal/Alertness: Awake/alert Behavior During Therapy: WFL for tasks assessed/performed Overall Cognitive Status: History of cognitive impairments - at baseline                                 General Comments: Patient is generally disoriented.      General Comments      Exercises     Assessment/Plan    PT Assessment Patient needs continued PT services  PT Problem List Decreased strength;Decreased activity tolerance;Decreased mobility;Decreased balance;Decreased cognition;Decreased knowledge of use of DME;Decreased safety awareness       PT Treatment Interventions DME instruction;Gait training;Functional mobility training;Therapeutic activities;Patient/family education;Therapeutic exercise;Balance training    PT Goals (Current goals can be found in the Care Plan section)  Acute Rehab PT Goals Patient Stated Goal: to go home and get better PT Goal Formulation: With patient Time For Goal Achievement: 07/14/20 Potential to Achieve Goals: Good    Frequency Min 2X/week   Barriers to discharge        Co-evaluation               AM-PAC PT "6 Clicks" Mobility  Outcome Measure Help needed turning from your back to your side while in a flat bed without using bedrails?: None Help needed moving from lying on your back to sitting on the side of a flat bed without using bedrails?: None Help needed moving to and from a bed to a chair (including a wheelchair)?: A Little Help needed standing up from a chair using your arms (e.g., wheelchair or bedside chair)?: A Little Help needed to walk in hospital room?: A Little Help needed climbing 3-5 steps with a railing? : A Little 6 Click Score:  20    End of Session   Activity Tolerance: Patient tolerated treatment well Patient left: in chair;with call bell/phone within reach Nurse Communication: Mobility status PT Visit Diagnosis: Muscle weakness (generalized) (M62.81);Difficulty in walking, not elsewhere classified (R26.2)    Time: 0981-1914 PT Time Calculation (min) (ACUTE ONLY): 12 min   Charges:   PT Evaluation $PT Eval Low Complexity: 1 Low          Dvonte Gatliff, PT, GCS 07/07/20,2:28 PM

## 2020-07-08 DIAGNOSIS — D469 Myelodysplastic syndrome, unspecified: Secondary | ICD-10-CM

## 2020-07-08 LAB — CBC WITH DIFFERENTIAL/PLATELET
Abs Immature Granulocytes: 0.01 10*3/uL (ref 0.00–0.07)
Basophils Absolute: 0 10*3/uL (ref 0.0–0.1)
Basophils Relative: 0 %
Eosinophils Absolute: 0 10*3/uL (ref 0.0–0.5)
Eosinophils Relative: 1 %
HCT: 30.3 % — ABNORMAL LOW (ref 36.0–46.0)
Hemoglobin: 10.2 g/dL — ABNORMAL LOW (ref 12.0–15.0)
Immature Granulocytes: 0 %
Lymphocytes Relative: 33 %
Lymphs Abs: 1.9 10*3/uL (ref 0.7–4.0)
MCH: 33.9 pg (ref 26.0–34.0)
MCHC: 33.7 g/dL (ref 30.0–36.0)
MCV: 100.7 fL — ABNORMAL HIGH (ref 80.0–100.0)
Monocytes Absolute: 0.7 10*3/uL (ref 0.1–1.0)
Monocytes Relative: 13 %
Neutro Abs: 3 10*3/uL (ref 1.7–7.7)
Neutrophils Relative %: 53 %
Platelets: 125 10*3/uL — ABNORMAL LOW (ref 150–400)
RBC: 3.01 MIL/uL — ABNORMAL LOW (ref 3.87–5.11)
RDW: 12.9 % (ref 11.5–15.5)
WBC: 5.6 10*3/uL (ref 4.0–10.5)
nRBC: 0 % (ref 0.0–0.2)

## 2020-07-08 LAB — BASIC METABOLIC PANEL
Anion gap: 7 (ref 5–15)
BUN: 26 mg/dL — ABNORMAL HIGH (ref 8–23)
CO2: 26 mmol/L (ref 22–32)
Calcium: 8.4 mg/dL — ABNORMAL LOW (ref 8.9–10.3)
Chloride: 107 mmol/L (ref 98–111)
Creatinine, Ser: 0.87 mg/dL (ref 0.44–1.00)
GFR, Estimated: >60 mL/min (ref >60)
Glucose, Bld: 93 mg/dL (ref 70–99)
Potassium: 4.4 mmol/L (ref 3.5–5.1)
Sodium: 140 mmol/L (ref 135–145)

## 2020-07-08 LAB — PHOSPHORUS: Phosphorus: 3.5 mg/dL (ref 2.5–4.6)

## 2020-07-08 LAB — MAGNESIUM: Magnesium: 1.7 mg/dL (ref 1.7–2.4)

## 2020-07-08 LAB — C-REACTIVE PROTEIN: CRP: 1.5 mg/dL — ABNORMAL HIGH (ref <1.0)

## 2020-07-08 LAB — D-DIMER, QUANTITATIVE: D-Dimer, Quant: 0.88 ug/mL-FEU — ABNORMAL HIGH (ref 0.00–0.50)

## 2020-07-08 LAB — FERRITIN: Ferritin: 315 ng/mL — ABNORMAL HIGH (ref 11–307)

## 2020-07-08 MED ORDER — ASCORBIC ACID 500 MG PO TABS: 500.0000 mg | ORAL_TABLET | Freq: Every day | ORAL | 0 refills | Status: AC

## 2020-07-08 MED ORDER — ZINC SULFATE 220 (50 ZN) MG PO CAPS: 220.0000 mg | ORAL_CAPSULE | Freq: Every day | ORAL | 0 refills | Status: AC

## 2020-07-08 NOTE — Discharge Summary (Signed)
Physician Discharge Summary  Patient ID: Sylvia Miller MRN: [id number] DOB/AGE: 09-Aug-1940 80 y.o.  Admit date: 07/06/2020 Discharge date: 07/08/2020  Admission Diagnoses:  Discharge Diagnoses:  Principal Problem:   AKI (acute kidney injury) ([place]) Active Problems:   Essential hypertension   MDS (myelodysplastic syndrome) ([facility])   Rheumatoid arthritis involving multiple sites with positive rheumatoid factor ([place])   COVID-19 virus detected   Dementia without behavioral disturbance [facility])   Discharged Condition: fair  Hospital Course:  Collene Miller a 80 y.o.femalewith medical history significant formyelodysplastic syndrome, GERD, dementia, rheumatoid arthritis, hypertension who was brought into the ER by her husband for evaluation of multiple symptoms which include poor oral intake for about 3 days, increased weakness as well as increased lethargy. She was a found to have acute kidney injury, was given fluids.  Patient was also positive for COVID, started on remdesivir.  But the patient does not have any hypoxemia or evidence of pneumonia on chest x-ray.  #1. Acute kidney injury secondary to dehydration. Metabolic acidosis. Patient received IV fluids with bicarb drip, renal function has normalized, metabolic acidosis have improved.  She has good appetite this morning, she has no nausea vomiting abdominal pain.  No diarrhea.  She is medically stable to be discharged.  2.  COVID-19 infection. No evidence of pneumonia. Condition stable, no hypoxemia.  3.  Essential hypertension.  Resumed lisinopril.  4.  Myelodysplastic syndrome. Thrombocytopenia. Stable.  5.  Dementia.    Consults: None  Significant Diagnostic Studies:  PORTABLE CHEST 1 VIEW  COMPARISON:  05/24/2019  FINDINGS: Single frontal view of the chest demonstrates an unremarkable cardiac silhouette. No acute airspace disease, effusion, or pneumothorax. Marked degenerative changes of the  bilateral shoulders. No acute bony abnormalities.  IMPRESSION: 1. No acute intrathoracic process.   Electronically Signed   By: Randa Ngo M.D.   On: 07/06/2020 15:26    Treatments: remdesivir, ivf  Discharge Exam: Blood pressure (!) 157/86, pulse 95, temperature 97.9 F (36.6 C), temperature source Oral, resp. rate 20, height 4\' 10"  (1.473 m), weight 60.3 kg, SpO2 99 %. General appearance: alert and cooperative Resp: clear to auscultation bilaterally and normal percussion bilaterally Cardio: regular rate and rhythm, S1, S2 normal, no murmur, click, rub or gallop GI: soft, non-tender; bowel sounds normal; no masses,  no organomegaly Extremities: extremities normal, atraumatic, no cyanosis or edema  Disposition: Discharge disposition: 01-Home or Self Care       Discharge Instructions    Diet - low sodium heart healthy   Complete by: As directed    Increase activity slowly   Complete by: As directed      Allergies as of 07/08/2020      Reactions   Pollen Extract Other (See Comments)   Nose runs, eyes itch, etc      Medication List    STOP taking these medications   hydrochlorothiazide 12.5 MG tablet Commonly known as: HYDRODIURIL     TAKE these medications   acetaminophen 325 MG tablet Commonly known as: TYLENOL Take 650 mg by mouth every 6 (six) hours as needed for moderate pain or headache.   albuterol 108 (90 Base) MCG/ACT inhaler Commonly known as: VENTOLIN HFA Inhale 2 puffs into the lungs every 6 (six) hours as needed for wheezing or shortness of breath.   ascorbic acid 500 MG tablet Commonly known as: VITAMIN C Take 1 tablet (500 mg total) by mouth daily for 10 days. Start taking on: Jul 09, 2020   aspirin EC 81 MG  tablet Take 81 mg by mouth daily.   atorvastatin 10 MG tablet Commonly known as: LIPITOR Take 5 mg by mouth daily. In the morning   BIOFREEZE EX Apply 1 application topically as needed (for knee/leg pain).   diclofenac  sodium 1 % Gel Commonly known as: VOLTAREN Apply 2 g topically 4 (four) times daily as needed (pain). Apply to painful joint   gabapentin 300 MG capsule Commonly known as: NEURONTIN Take 1 capsule (300 mg total) by mouth at bedtime.   GenTeal Tears 0.1-0.2-0.3 % Soln Place 1 drop into both eyes daily as needed (dry eye).   lisinopril 20 MG tablet Commonly known as: ZESTRIL Take 20 mg by mouth daily.   meloxicam 7.5 MG tablet Commonly known as: MOBIC Take 7.5 mg by mouth daily as needed.   memantine 10 MG tablet Commonly known as: NAMENDA Take 10 mg by mouth 2 (two) times daily.   mirtazapine 7.5 MG tablet Commonly known as: REMERON Take 7.5 mg by mouth at bedtime.   OCUVITE PO Take 0.5 tablets by mouth in the morning and at bedtime.   oxybutynin 10 MG 24 hr tablet Commonly known as: DITROPAN-XL Take 10 mg by mouth daily.   zinc sulfate 220 (50 Zn) MG capsule Take 1 capsule (220 mg total) by mouth daily for 10 days. Start taking on: Jul 09, 2020       32 minutes Signed: Sharen Hones 07/08/2020, 10:20 AM

## 2020-07-08 NOTE — TOC Transition Note (Signed)
Transition of Care [facility]) - CM/SW Discharge Note   Patient Details  Name: Sylvia Miller MRN: [id number] Date of Birth: [birth year]  Transition of Care [facility]) CM/SW Contact:  Alberteen Sam, LCSW Phone Number: 07/08/2020, 10:26 AM   Clinical Narrative:     Patient set up with home health PT, OT, and RN with [facility]. Spoke with Corene Cornea with [facility] to inform of patient's discharge today.   No further discharge needs identified at this time.   CSW signing off.   Final next level of care: Home w Home Health Services Barriers to Discharge: No Barriers Identified   Patient Goals and CMS Choice   CMS Medicare.gov Compare Post Acute Care list provided to:: Patient Choice offered to / list presented to : Patient  Discharge Placement                       Discharge Plan and Services     Post Acute Care Choice: [place]: PT,OT,RN [place] Agency: [place] ([place]) Date [place]: 07/08/20 Time [place]: 1017 Representative spoke with at [place]: [place] ([place]) Interventions     Readmission Risk Interventions No flowsheet data found.

## 2020-07-08 NOTE — Progress Notes (Signed)
Pt discharged home with her husband.  Discharge instructions provided to the pt and also to her daughter, Enid Derry over the phone.  VSS and pt without complaints.  Pt discharged via wheelchair.

## 2020-08-12 ENCOUNTER — Observation Stay
Admission: EM | Admit: 2020-08-12 | Discharge: 2020-08-13 | Disposition: A | Discharge status: Home / Self Care | Payer: Medicare Other | Attending: Family Medicine | Admitting: Family Medicine

## 2020-08-12 ENCOUNTER — Other Ambulatory Visit: Payer: Self-pay

## 2020-08-12 ENCOUNTER — Emergency Department: Payer: Medicare Other

## 2020-08-12 DIAGNOSIS — R160 Hepatomegaly, not elsewhere classified: Secondary | ICD-10-CM | POA: Diagnosis not present

## 2020-08-12 DIAGNOSIS — E875 Hyperkalemia: Secondary | ICD-10-CM | POA: Diagnosis present

## 2020-08-12 DIAGNOSIS — Z7982 Long term (current) use of aspirin: Secondary | ICD-10-CM | POA: Diagnosis not present

## 2020-08-12 DIAGNOSIS — Z20822 Contact with and (suspected) exposure to covid-19: Secondary | ICD-10-CM | POA: Diagnosis not present

## 2020-08-12 DIAGNOSIS — I10 Essential (primary) hypertension: Secondary | ICD-10-CM | POA: Diagnosis present

## 2020-08-12 DIAGNOSIS — R109 Unspecified abdominal pain: Secondary | ICD-10-CM | POA: Diagnosis present

## 2020-08-12 DIAGNOSIS — Z96653 Presence of artificial knee joint, bilateral: Secondary | ICD-10-CM | POA: Diagnosis not present

## 2020-08-12 DIAGNOSIS — Z79899 Other long term (current) drug therapy: Secondary | ICD-10-CM | POA: Diagnosis not present

## 2020-08-12 DIAGNOSIS — F0390 Unspecified dementia without behavioral disturbance: Secondary | ICD-10-CM | POA: Diagnosis present

## 2020-08-12 DIAGNOSIS — G301 Alzheimer's disease with late onset: Secondary | ICD-10-CM

## 2020-08-12 DIAGNOSIS — F0280 Dementia in other diseases classified elsewhere without behavioral disturbance: Secondary | ICD-10-CM

## 2020-08-12 DIAGNOSIS — Z8616 Personal history of COVID-19: Secondary | ICD-10-CM | POA: Diagnosis not present

## 2020-08-12 DIAGNOSIS — D469 Myelodysplastic syndrome, unspecified: Secondary | ICD-10-CM | POA: Diagnosis present

## 2020-08-12 DIAGNOSIS — E872 Acidosis: Secondary | ICD-10-CM | POA: Insufficient documentation

## 2020-08-12 DIAGNOSIS — N179 Acute kidney failure, unspecified: Principal | ICD-10-CM | POA: Diagnosis present

## 2020-08-12 DIAGNOSIS — M0579 Rheumatoid arthritis with rheumatoid factor of multiple sites without organ or systems involvement: Secondary | ICD-10-CM | POA: Diagnosis present

## 2020-08-12 DIAGNOSIS — R1084 Generalized abdominal pain: Secondary | ICD-10-CM | POA: Diagnosis present

## 2020-08-12 LAB — URINALYSIS, COMPLETE (UACMP) WITH MICROSCOPIC
Bilirubin Urine: NEGATIVE
Glucose, UA: NEGATIVE mg/dL
Ketones, ur: NEGATIVE mg/dL
Nitrite: NEGATIVE
Protein, ur: 30 mg/dL — AB
Specific Gravity, Urine: 1.017 (ref 1.005–1.030)
pH: 5 (ref 5.0–8.0)

## 2020-08-12 LAB — COMPREHENSIVE METABOLIC PANEL
ALT: 13 U/L (ref 0–44)
AST: 29 U/L (ref 15–41)
Albumin: 4.3 g/dL (ref 3.5–5.0)
Alkaline Phosphatase: 114 U/L (ref 38–126)
Anion gap: 12 (ref 5–15)
BUN: 52 mg/dL — ABNORMAL HIGH (ref 8–23)
CO2: 20 mmol/L — ABNORMAL LOW (ref 22–32)
Calcium: 9 mg/dL (ref 8.9–10.3)
Chloride: 105 mmol/L (ref 98–111)
Creatinine, Ser: 1.85 mg/dL — ABNORMAL HIGH (ref 0.44–1.00)
GFR, Estimated: 27 mL/min — ABNORMAL LOW (ref >60)
Glucose, Bld: 110 mg/dL — ABNORMAL HIGH (ref 70–99)
Potassium: 5.2 mmol/L — ABNORMAL HIGH (ref 3.5–5.1)
Sodium: 137 mmol/L (ref 135–145)
Total Bilirubin: 0.8 mg/dL (ref 0.3–1.2)
Total Protein: 7.6 g/dL (ref 6.5–8.1)

## 2020-08-12 LAB — CBC
HCT: 35.2 % — ABNORMAL LOW (ref 36.0–46.0)
Hemoglobin: 12.2 g/dL (ref 12.0–15.0)
MCH: 35.1 pg — ABNORMAL HIGH (ref 26.0–34.0)
MCHC: 34.7 g/dL (ref 30.0–36.0)
MCV: 101.1 fL — ABNORMAL HIGH (ref 80.0–100.0)
Platelets: 204 10*3/uL (ref 150–400)
RBC: 3.48 MIL/uL — ABNORMAL LOW (ref 3.87–5.11)
RDW: 13.3 % (ref 11.5–15.5)
WBC: 12.3 10*3/uL — ABNORMAL HIGH (ref 4.0–10.5)
nRBC: 0 % (ref 0.0–0.2)

## 2020-08-12 LAB — LIPASE, BLOOD: Lipase: 36 U/L (ref 11–51)

## 2020-08-12 MED ORDER — ACETAMINOPHEN 325 MG PO TABS: 650.0000 mg | ORAL_TABLET | Freq: Four times a day (QID) | ORAL | Status: DC | PRN

## 2020-08-12 MED ORDER — ENOXAPARIN SODIUM 30 MG/0.3ML IJ SOSY
30.0000 mg | PREFILLED_SYRINGE | INTRAMUSCULAR | Status: DC
  Administered 2020-08-12: 30 mg via SUBCUTANEOUS
  Filled 2020-08-12 (×2): qty 0.3

## 2020-08-12 MED ORDER — DONEPEZIL HCL 5 MG PO TABS
10.0000 mg | ORAL_TABLET | Freq: Every day | ORAL | Status: DC
  Administered 2020-08-12: 10 mg via ORAL
  Filled 2020-08-12: qty 2

## 2020-08-12 MED ORDER — LACTATED RINGERS IV BOLUS
1000.0000 mL | Freq: Once | INTRAVENOUS | Status: AC
  Administered 2020-08-12: 1000 mL via INTRAVENOUS

## 2020-08-12 MED ORDER — ATORVASTATIN CALCIUM 10 MG PO TABS
5.0000 mg | ORAL_TABLET | Freq: Every day | ORAL | Status: DC
  Administered 2020-08-13: 5 mg via ORAL
  Filled 2020-08-12: qty 1

## 2020-08-12 MED ORDER — LACTATED RINGERS IV SOLN: INTRAVENOUS | Status: AC

## 2020-08-12 MED ORDER — POLYETHYLENE GLYCOL 3350 17 G PO PACK: 17.0000 g | PACK | Freq: Every day | ORAL | Status: DC | PRN

## 2020-08-12 MED ORDER — ACETAMINOPHEN 650 MG RE SUPP: 650.0000 mg | Freq: Four times a day (QID) | RECTAL | Status: DC | PRN

## 2020-08-12 MED ORDER — MEMANTINE HCL 5 MG PO TABS
10.0000 mg | ORAL_TABLET | Freq: Two times a day (BID) | ORAL | Status: DC
  Administered 2020-08-12 – 2020-08-13 (×2): 10 mg via ORAL
  Filled 2020-08-12 (×2): qty 2

## 2020-08-12 MED ORDER — DOCUSATE SODIUM 100 MG PO CAPS
100.0000 mg | ORAL_CAPSULE | Freq: Two times a day (BID) | ORAL | Status: DC
  Administered 2020-08-12 – 2020-08-13 (×2): 100 mg via ORAL
  Filled 2020-08-12 (×2): qty 1

## 2020-08-12 MED ORDER — ONDANSETRON HCL 4 MG/2ML IJ SOLN: 4.0000 mg | Freq: Four times a day (QID) | INTRAMUSCULAR | Status: DC | PRN

## 2020-08-12 MED ORDER — ASPIRIN EC 81 MG PO TBEC
81.0000 mg | DELAYED_RELEASE_TABLET | Freq: Every day | ORAL | Status: DC
  Administered 2020-08-12 – 2020-08-13 (×2): 81 mg via ORAL
  Filled 2020-08-12 (×2): qty 1

## 2020-08-12 MED ORDER — ONDANSETRON HCL 4 MG PO TABS: 4.0000 mg | ORAL_TABLET | Freq: Four times a day (QID) | ORAL | Status: DC | PRN

## 2020-08-12 MED ORDER — ONDANSETRON HCL 4 MG/2ML IJ SOLN
4.0000 mg | Freq: Once | INTRAMUSCULAR | Status: AC
  Administered 2020-08-12: 4 mg via INTRAVENOUS
  Filled 2020-08-12: qty 2

## 2020-08-12 MED ORDER — PANTOPRAZOLE SODIUM 40 MG PO TBEC
40.0000 mg | DELAYED_RELEASE_TABLET | Freq: Two times a day (BID) | ORAL | Status: DC
  Administered 2020-08-12 – 2020-08-13 (×2): 40 mg via ORAL
  Filled 2020-08-12 (×2): qty 1

## 2020-08-12 MED ORDER — MIRTAZAPINE 15 MG PO TABS
7.5000 mg | ORAL_TABLET | Freq: Every day | ORAL | Status: DC
  Administered 2020-08-12: 7.5 mg via ORAL
  Filled 2020-08-12: qty 1

## 2020-08-12 NOTE — H&P (Signed)
History and Physical  Patient Name: Sylvia Miller     QIH:[id number]    DOB: 1940-09-26    DOA: 08/12/2020 PCP: Dion Body, MD  Patient coming from: Home  Chief Complaint: Epigastric pain, nausea, decreased energy      HPI: [name] is a 80 y.o. F with hx dementia, home-dwelling, RA on Mobic, MDS, recent COVID in May, and hx chronic abdominal pain with GERD and hx gastric ulcers who presents with 1 week post-prandial abdominal pain, and now fatigue and nausea.  All history collected from husband at the bedside as the patient has dementia.  Patient was admitted 1 month ago for fatigue, poor appetite, nausea, found to have mild AKI, resolved with IV fluids.  No explanation found at that time for GI symptoms, which appear to have resolved spontaneously.  Since discharge, she improved, was eating well, seemed have good energy with [facility] physical therapy, until about 1 week ago when husband noticed that she was complaining of abdominal pain after every meal, did not eat anything, was not taking her pills, and seemed more sleepy and sluggish.  Finally she was very weak and so he brought her to the ER.  In the ER, creatinine 1.85 (from baseline 0.9), potassium 5.2, lipase normal.  CT of the abdomen and pelvis without contrast showed no small bowel obstruction, no pancreatitis, and only a new lesion of the liver that was consistent with hemangioma.  Patient was given IV fluids and antiemetics and the hospitalist service were asked to evaluate for AKI.   Notably, the patient and her husband are not able to describe the location, character of the pain at all, only that it is intermittent, mostly post-prandial.         ROS: Review of Systems  Constitutional:  Positive for malaise/fatigue. Negative for chills and fever.  Gastrointestinal:  Positive for abdominal pain, constipation and nausea. Negative for blood in stool, diarrhea, melena and vomiting.  All other systems reviewed and are  negative.        Past Medical History:  Diagnosis Date   Allergy    Anemia    Arthritis    rheumatoid /knees/hands/shoulders (Right shoulder) infusion every 5 weeks   Bladder incontinence    Chicken pox    Dyspnea    with exertion or far walking   Gastric ulcer    GERD (gastroesophageal reflux disease)    Heart murmur    Hypercholesteremia    Hyperlipidemia, unspecified    Hypertension    controlled with meds;    MDS (myelodysplastic syndrome) ([place]) 2000   Memory deficit    Myelodysplastic syndrome (HCC)    Rheumatoid arthritis ([place])    Ulcer     Past Surgical History:  Procedure Laterality Date   ABDOMINAL HYSTERECTOMY     APPENDECTOMY     as a child   CHOLECYSTECTOMY     COLONOSCOPY     COLONOSCOPY WITH PROPOFOL N/A 04/24/2017   Procedure: COLONOSCOPY WITH PROPOFOL;  Surgeon: Lollie Sails, MD;  Location: [facility] ENDOSCOPY;  Service: Endoscopy;  Laterality: N/A;   ESOPHAGOGASTRODUODENOSCOPY (EGD) WITH PROPOFOL N/A 04/24/2017   Procedure: ESOPHAGOGASTRODUODENOSCOPY (EGD) WITH PROPOFOL;  Surgeon: Lollie Sails, MD;  Location: [facility] ENDOSCOPY;  Service: Endoscopy;  Laterality: N/A;   JOINT REPLACEMENT     KNEE ARTHROPLASTY Right 02/21/2017   Procedure: COMPUTER ASSISTED TOTAL KNEE ARTHROPLASTY;  Surgeon: Dereck Leep, MD;  Location: [facility] ORS;  Service: Orthopedics;  Laterality: Right;   KNEE ARTHROPLASTY Left 08/06/2017  Procedure: COMPUTER ASSISTED TOTAL KNEE ARTHROPLASTY;  Surgeon: Dereck Leep, MD;  Location: [facility] ORS;  Service: Orthopedics;  Laterality: Left;   NM INTRAVENOUS INJECTION ([facility] HX)     receives biologic infusions every 5 weeks for rheumatoid arthritis. followed by dr. Meda Coffee at [facility]     WISDOM TOOTH EXTRACTION      Social History: Patient lives with her husband.  The patient walks unassisted.  Nonsmoker.  Allergies  Allergen Reactions   Pollen Extract Other (See Comments)     Nose runs, eyes itch, etc    Family history: family history includes Cancer in her mother; Lung cancer in her father and mother.  No family history of gastrointestinal disease  Prior to Admission medications   Medication Sig Start Date End Date Taking? Authorizing Provider  aspirin EC 81 MG tablet Take 81 mg by mouth daily.   Yes [provider]  atorvastatin (LIPITOR) 10 MG tablet Take 5 mg by mouth daily. In the morning   Yes [provider]  donepezil (ARICEPT) 10 MG tablet Take 10 mg by mouth at bedtime. 07/30/20  Yes [provider]  hydrochlorothiazide (HYDRODIURIL) 12.5 MG tablet Take 12.5 mg by mouth daily. 07/26/20  Yes [provider]  lisinopril (ZESTRIL) 20 MG tablet Take 20 mg by mouth daily.   Yes [provider]  memantine (NAMENDA) 10 MG tablet Take 10 mg by mouth 2 (two) times daily. 05/12/19  Yes [provider]  mirtazapine (REMERON) 7.5 MG tablet Take 7.5 mg by mouth at bedtime. 06/06/20  Yes [provider]  Multiple Vitamins-Minerals (OCUVITE PO) Take 0.5 tablets by mouth in the morning and at bedtime.   Yes [provider]  pantoprazole (PROTONIX) 40 MG tablet Take 40 mg by mouth daily. 08/10/20  Yes [provider]  acetaminophen (TYLENOL) 325 MG tablet Take 650 mg by mouth every 6 (six) hours as needed for moderate pain or headache.     [provider]  albuterol (VENTOLIN HFA) 108 (90 Base) MCG/ACT inhaler Inhale 2 puffs into the lungs every 6 (six) hours as needed for wheezing or shortness of breath. Patient not taking: Reported on 08/12/2020 05/24/19   Merlyn Lot, MD  Artificial Tear Solution (GENTEAL TEARS) 0.1-0.2-0.3 % SOLN Place 1 drop into both eyes daily as needed (dry eye).     [provider]  diclofenac sodium (VOLTAREN) 1 % GEL Apply 2 g topically 4 (four) times daily as needed (pain). Apply to painful joint    [provider]  Menthol, Topical Analgesic,  (BIOFREEZE EX) Apply 1 application topically as needed (for knee/leg pain).    [provider]       Physical Exam: BP (!) 96/51   Pulse 65   Temp 97.7 F (36.5 C) (Oral)   Resp 17   Ht 4\' 11"  (1.499 m)   Wt 63.5 kg   SpO2 100%   BMI 28.28 kg/m  General appearance: Elderly adult female, alert and in no obvious distress, lying in  ER cot.   Eyes: Anicteric, conjunctiva pink, lids and lashes normal. PERRL.    ENT: No nasal deformity, discharge, epistaxis.  Hearing normal. OP moist without lesions.   Neck: No neck masses.  Trachea midline.  No thyromegaly/tenderness. Lymph: No cervical or supraclavicular lymphadenopathy. Skin: Warm and dry.  No jaundice.  No suspicious rashes or lesions. Cardiac: RRR, nl S1-S2, no murmurs appreciated.  Capillary refill  is brisk.  JVP not visible.  No LE edema.  Radial pulses 2+ and symmetric. Respiratory: Normal respiratory rate and rhythm.  CTAB without rales or wheezes. Abdomen: Abdomen soft.  No TTP or guarding in all 4 quadrants, no rigidity or rebound. No ascites, distension, hepatosplenomegaly.   MSK: No effusions of the large joints of the upper or lower extremities bilaterally, no effusions of the small joints of the hands.  No cyanosis or clubbing.  Some mild deformities, swan-neck. Neuro: Face symmetric. Speech is fluent.  Muscle strength with generalized weakness, but symmetric strength.    Psych: Sensorium intact and responding to questions, attention normal.  Behavior appropriate.  Affect pleasant.  Judgment and insight appear impaired by dementia.  Oriented to hospital, but not month or year.     Labs on Admission:  I have personally reviewed following labs and imaging studies: CBC: Recent Labs  Lab 08/12/20 1511  WBC 12.3*  HGB 12.2  HCT 35.2*  MCV 101.1*  PLT 081   Basic Metabolic Panel: Recent Labs  Lab 08/12/20 1511  NA 137  K 5.2*  CL 105  CO2 20*  GLUCOSE 110*  BUN 52*  CREATININE 1.85*  CALCIUM 9.0    GFR: Estimated Creatinine Clearance: 19.6 mL/min (A) (by C-G formula based on SCr of 1.85 mg/dL (H)).  Liver Function Tests: Recent Labs  Lab 08/12/20 1511  AST 29  ALT 13  ALKPHOS 114  BILITOT 0.8  PROT 7.6  ALBUMIN 4.3   Recent Labs  Lab 08/12/20 1511  LIPASE 36      Radiological Exams on Admission: Personally reviewed CT abdomen pelvis report which shows a 12 cm low-attenuation area in the left lobe of the liver: CT Abdomen Pelvis Wo Contrast  Result Date: 08/12/2020 CLINICAL DATA:  Abdominal pain and nausea. EXAM: CT ABDOMEN AND PELVIS WITHOUT CONTRAST TECHNIQUE: Multidetector CT imaging of the abdomen and pelvis was performed following the standard protocol without IV contrast. COMPARISON:  June 27, 2016 FINDINGS: Lower chest: No acute abnormality. Hepatobiliary: A calcified granuloma is seen within the right lobe of the liver. A 12.2 cm x 9.5 cm area of low attenuation is seen throughout a large portion of the left lobe. This represents a new finding when compared to the prior study. Status post cholecystectomy. No biliary dilatation. Pancreas: Unremarkable. No pancreatic ductal dilatation or surrounding inflammatory changes. Spleen: Normal in size without focal abnormality. Adrenals/Urinary Tract: Adrenal glands are unremarkable. Kidneys are normal in size, without renal calculi or hydronephrosis. A stable 2.8 cm diameter cyst is seen within the right kidney. An additional 1.7 cm diameter exophytic cyst is seen within the posterolateral aspect of the mid left kidney. The urinary bladder is empty and subsequently limited in evaluation. Stomach/Bowel: There is a small hiatal hernia. The appendix is surgically absent. No evidence of bowel wall thickening, distention, or inflammatory changes. Noninflamed diverticula are seen throughout the large bowel. Vascular/Lymphatic: Mild aortic atherosclerosis. No enlarged abdominal or pelvic lymph nodes. Reproductive: Status post hysterectomy.  No adnexal masses. Other: There is a 2.0 cm x 1.7 cm fat containing umbilical hernia. No abdominopelvic ascites. Musculoskeletal: Multilevel degenerative changes are seen throughout the lumbar spine IMPRESSION: 1. Interval development of a large area of low attenuation within the left lobe of the liver which may represent a large hepatic cyst versus hemangioma. Correlation with contrast-enhanced abdomen pelvis CT is recommended to confirm the presence of benign imaging features. This recommendation follows ACR consensus guidelines: Managing Incidental Findings on Abdominal CT:  White Paper of the ACR Incidental Findings Committee. J Am Coll Radiol 2010;7:754-773 2. Evidence of prior cholecystectomy. 3. Colonic diverticulosis. 4. Bilateral renal cysts. Electronically Signed   By: Virgina Norfolk M.D.   On: 08/12/2020 16:01    EKG: Independently reviewed.  Rate 80, QTc normal, no ST changes, no peaked T waves       Assessment/Plan   Acute kidney injury Hyperkalemia Non-anion gap metabolic acidosis This appears to be from poor p.o. intake in setting of lisinopril, NSAID use - Hold lisinopril, Mobic -IV fluids - Trend creatinine  Abdominal pain The character of this is hard to determine.  We will need to observe her overnight, advance her diet as tolerated, and observe for symptoms so that we can characterize them  Of note, she had an upper GI series in 2019 after an aborted EGD due to laryngospasm that showed a small hiatal hernia and severe gastroesophageal reflux extending up into the cervical esophagus and some tertiary contractions of the distal esophagus consistent with spasm.  She follows with [facility] gastroenterology, for chronic periumbilical pain described as "an ache after she eats or sometimes during meals" and also "as sour stomach" associated with intermittent nausea and occasional spontaneous regurgitation, and symptoms have been present intermittently for years without weight  loss.  To me her symptoms sound most consistent with dyspepsia or possibly a NSAID related gastritis -Hold Mobic -Increase PPI to BID -Advance diet as tolerated    Rheumatoid arthritis No flare -Hold Mobic  Dementia -Continue Namenda, donepezil, Remeron  Hypertension Blood pressure soft - Hold lisinopril, HCTZ - Continue aspirin, atorvastatin - IV fluids  Myelodysplastic syndrome Macrocytosis noted, hemoglobin normal, mild leukocytosis, doubt infection.     DVT prophylaxis: Low-dose Lovenox, renally adjusted  Code Status: Full code, discussed with husband Family Communication: Husband at the bedside Disposition Plan: Anticipate IV fluids overnight, recheck creatinine in advance diet as tolerated, if creatinine better and tolerating diet well with high-dose PPI, likely home tomorrow Consults called: None Admission status: Observation   At the point of initial evaluation, it is my clinical opinion that admission for OBSERVATION is reasonable and necessary because the patient's presenting complaints in the context of their chronic conditions represent sufficient risk of deterioration or significant morbidity to constitute reasonable grounds for close observation in the hospital setting, but that the patient may be medically stable for discharge from the hospital within 24 to 48 hours.    Medical decision making: Patient seen at 5:18 PM on 08/12/2020.  The patient was discussed with Dr. Charna Archer.  What exists of the patient's chart was reviewed in depth and summarized above.  Clinical condition: Stable.        [facility] Please page though [place] or Epic secure chat:  For password, contact charge nurse

## 2020-08-12 NOTE — ED Provider Notes (Signed)
[facility] Emergency Department Provider Note   ____________________________________________   Event Date/Time   First MD Initiated Contact with Patient 08/12/20 1502     (approximate)  I have reviewed the triage vital signs and the nursing notes.   HISTORY  Chief Complaint Emesis    HPI Sylvia Miller is a 80 y.o. female with possible history of dementia, hypertension, myelodysplastic syndrome, rheumatoid arthritis, and GERD who presents to the ED for abdominal pain.  History is limited due to patient's dementia and majority of history is obtained from her husband.  He states that for at least the past week the patient has been complaining of abdominal pain whenever she goes to eat anything.  She describes a sharp pain affecting her abdomen diffusely and will sometimes say that it moves up into her chest.  She had a similar episode today after eating lunch when she felt nauseous, but did not vomit.  Husband is concerned that she is not having bowel movements like normal, patient is unable to recall her last bowel movement.  She has not had any blood in her stool, dysuria, fever, or flank pain.  Patient denies any current chest pain, cough, or shortness of breath.  She does currently complain of mild aching diffusely in her abdomen.        Past Medical History:  Diagnosis Date   Allergy    Anemia    Arthritis    rheumatoid /knees/hands/shoulders (Right shoulder) infusion every 5 weeks   Bladder incontinence    Chicken pox    Dyspnea    with exertion or far walking   Gastric ulcer    GERD (gastroesophageal reflux disease)    Heart murmur    Hypercholesteremia    Hyperlipidemia, unspecified    Hypertension    controlled with meds;    MDS (myelodysplastic syndrome) ([place]) 2000   Memory deficit    Muscular dystrophy ([place])    per patient, this is incorrect   Myelodysplastic syndrome ([place])    Myelodysplastic syndrome ([place])    Rheumatoid arthritis  ([place])    Ulcer     Patient Active Problem List   Diagnosis Date Noted   AKI (acute kidney injury) ([place]) 07/06/2020   COVID-19 virus detected 07/06/2020   Dementia without behavioral disturbance ([place]) 07/06/2020   Protein-calorie malnutrition ([place]) 08/27/2017   S/P total knee arthroplasty 08/06/2017   Cubital canal compression syndrome, right 04/18/2017   Primary osteoarthritis of knee 03/25/2017   Status post total right knee replacement 02/21/2017   Pure hypercholesterolemia 08/17/2016   MDS (myelodysplastic syndrome) ([place]) 05/12/2016   B12 deficiency 05/04/2016   Essential hypertension 05/04/2016   Mixed stress and urge urinary incontinence 05/04/2016   Rheumatoid arthritis involving multiple sites with positive rheumatoid factor ([place]) 05/04/2016    Past Surgical History:  Procedure Laterality Date   ABDOMINAL HYSTERECTOMY     APPENDECTOMY     as a child   CHOLECYSTECTOMY     COLONOSCOPY     COLONOSCOPY WITH PROPOFOL N/A 04/24/2017   Procedure: COLONOSCOPY WITH PROPOFOL;  Surgeon: Lollie Sails, MD;  Location: [facility] ENDOSCOPY;  Service: Endoscopy;  Laterality: N/A;   ESOPHAGOGASTRODUODENOSCOPY (EGD) WITH PROPOFOL N/A 04/24/2017   Procedure: ESOPHAGOGASTRODUODENOSCOPY (EGD) WITH PROPOFOL;  Surgeon: Lollie Sails, MD;  Location: [facility] ENDOSCOPY;  Service: Endoscopy;  Laterality: N/A;   JOINT REPLACEMENT     KNEE ARTHROPLASTY Right 02/21/2017   Procedure: COMPUTER ASSISTED TOTAL KNEE ARTHROPLASTY;  Surgeon: Dereck Leep, MD;  Location: [facility] ORS;  Service: Orthopedics;  Laterality: Right;   KNEE ARTHROPLASTY Left 08/06/2017   Procedure: COMPUTER ASSISTED TOTAL KNEE ARTHROPLASTY;  Surgeon: Dereck Leep, MD;  Location: [facility] ORS;  Service: Orthopedics;  Laterality: Left;   NM INTRAVENOUS INJECTION ([facility] HX)     receives biologic infusions every 5 weeks for rheumatoid arthritis. followed by dr. Meda Coffee at [facility]      Prior to Admission medications   Medication Sig Start Date End Date Taking? Authorizing Provider  acetaminophen (TYLENOL) 325 MG tablet Take 650 mg by mouth every 6 (six) hours as needed for moderate pain or headache.     [provider]  albuterol (VENTOLIN HFA) 108 (90 Base) MCG/ACT inhaler Inhale 2 puffs into the lungs every 6 (six) hours as needed for wheezing or shortness of breath. 05/24/19   Merlyn Lot, MD  Artificial Tear Solution (GENTEAL TEARS) 0.1-0.2-0.3 % SOLN Place 1 drop into both eyes daily as needed (dry eye).     [provider]  aspirin EC 81 MG tablet Take 81 mg by mouth daily.    [provider]  atorvastatin (LIPITOR) 10 MG tablet Take 5 mg by mouth daily. In the morning    [provider]  diclofenac sodium (VOLTAREN) 1 % GEL Apply 2 g topically 4 (four) times daily as needed (pain). Apply to painful joint    [provider]  gabapentin (NEURONTIN) 300 MG capsule Take 1 capsule (300 mg total) by mouth at bedtime. Patient not taking: Reported on 07/06/2020 08/08/17   Reche Dixon, PA-C  lisinopril (ZESTRIL) 20 MG tablet Take 20 mg by mouth daily. 05/22/19   [provider]  meloxicam (MOBIC) 7.5 MG tablet Take 7.5 mg by mouth daily as needed. 04/15/18   [provider]  memantine (NAMENDA) 10 MG tablet Take 10 mg by mouth 2 (two) times daily. 05/12/19   [provider]  Menthol, Topical Analgesic, (BIOFREEZE EX) Apply 1 application topically as needed (for knee/leg pain).    [provider]  mirtazapine (REMERON) 7.5 MG tablet Take 7.5 mg by mouth at bedtime. 06/06/20   [provider]  Multiple Vitamins-Minerals (OCUVITE PO) Take 0.5 tablets by mouth in the morning and at bedtime.    [provider]  oxybutynin (DITROPAN-XL) 10 MG 24 hr tablet Take 10 mg by mouth daily. 12/27/16   [provider]    Allergies Pollen extract  Family  History  Problem Relation Age of Onset   Cancer Mother    Lung cancer Mother    Lung cancer Father    Colon cancer Neg Hx    Colon polyps Neg Hx    Rectal cancer Neg Hx     Social History Social History   Tobacco Use   Smoking status: Never   Smokeless tobacco: Never  Vaping Use   Vaping Use: Never used  Substance Use Topics   Alcohol use: Yes    Comment: very occasional   Drug use: Never    Review of Systems  Constitutional: No fever/chills Eyes: No visual changes. ENT: No sore throat. Cardiovascular: Denies chest pain. Respiratory: Denies shortness of breath. Gastrointestinal: Positive for abdominal pain and nausea, no vomiting.  No diarrhea.  No constipation. Genitourinary: Negative for dysuria. Musculoskeletal: Negative for back pain. Skin: Negative for rash. Neurological: Negative for headaches, focal weakness or numbness.  ____________________________________________  PHYSICAL EXAM:  VITAL SIGNS: ED Triage Vitals  Enc Vitals Group     BP 08/12/20 1504 (!) 116/56     Pulse Rate 08/12/20 1504 86     Resp 08/12/20 1504 (!) 22     Temp 08/12/20 1504 97.7 F (36.5 C)     Temp Source 08/12/20 1504 Oral     SpO2 08/12/20 1504 99 %     Weight 08/12/20 1505 140 lb (63.5 kg)     Height 08/12/20 1505 4\' 11"  (1.499 m)     Head Circumference --      Peak Flow --      Pain Score 08/12/20 1505 5     Pain Loc --      Pain Edu? --      Excl. in [place]? --     Constitutional: Awake and alert. Eyes: Conjunctivae are normal. Head: Atraumatic. Nose: No congestion/rhinnorhea. Mouth/Throat: Mucous membranes are moist. Neck: Normal ROM Cardiovascular: Normal rate, regular rhythm. Grossly normal heart sounds. Respiratory: Normal respiratory effort.  No retractions. Lungs CTAB. Gastrointestinal: Soft and mildly tender to palpation diffusely, no rebound or guarding.  No distention. Genitourinary: deferred Musculoskeletal: No lower extremity tenderness nor  edema. Neurologic:  Normal speech and language. No gross focal neurologic deficits are appreciated. Skin:  Skin is warm, dry and intact. No rash noted. Psychiatric: Mood and affect are normal. Speech and behavior are normal.  ____________________________________________   LABS (all labs ordered are listed, but only abnormal results are displayed)  Labs Reviewed  COMPREHENSIVE METABOLIC PANEL - Abnormal; Notable for the following components:      Result Value   Potassium 5.2 (*)    CO2 20 (*)    Glucose, Bld 110 (*)    BUN 52 (*)    Creatinine, Ser 1.85 (*)    GFR, Estimated 27 (*)    All other components within normal limits  CBC - Abnormal; Notable for the following components:   WBC 12.3 (*)    RBC 3.48 (*)    HCT 35.2 (*)    MCV 101.1 (*)    MCH 35.1 (*)    All other components within normal limits  SARS CORONAVIRUS 2 (TAT 6-24 HRS)  LIPASE, BLOOD  URINALYSIS, COMPLETE (UACMP) WITH MICROSCOPIC   ____________________________________________  EKG  ED ECG REPORT I, Blake Divine, the attending physician, personally viewed and interpreted this ECG.   Date: 08/12/2020  EKG Time: 15:12  Rate: 80  Rhythm: normal EKG, normal sinus rhythm, unchanged from previous tracings  Axis: Normal  Intervals:none  ST&T Change: None   PROCEDURES  Procedure(s) performed (including Critical Care):  Procedures   ____________________________________________   INITIAL IMPRESSION / ASSESSMENT AND PLAN / ED COURSE      80 year old female with past medical history of hypertension, myelodysplastic syndrome, rheumatoid arthritis, GERD, and dementia who presents to the ED for intermittent pain in her abdomen along with nausea when she eats.  Pain will occasionally go up into her chest but she currently denies any chest pain, EKG shows no evidence of arrhythmia or ischemia and I doubt cardiac etiology.  She does have diffuse tenderness on exam and we will further assess for obstruction  or other acute pathology with CT scan.  Labs including LFTs and lipase are pending, UA is also pending.  We will hydrate patient with IV fluids and treat with IV Zofran.  Labs remarkable for AKI with mild associated hyperkalemia at 5.2.  We will continue IV fluid hydration and plan  to discuss with hospitalist for admission for further management of her AKI.  CT scan does show new area of low attenuation in the liver of unclear etiology, it is also unclear if this is contributing to her symptoms.  She will need repeat CT scan with contrast during admission to further assess, although her kidney function does not support this currently.      ____________________________________________   FINAL CLINICAL IMPRESSION(S) / ED DIAGNOSES  Final diagnoses:  AKI (acute kidney injury) ([place])  Liver mass     ED Discharge Orders     None        Note:  This document was prepared using Dragon voice recognition software and may include unintentional dictation errors.    Blake Divine, MD 08/12/20 [phone]

## 2020-08-12 NOTE — ED Triage Notes (Signed)
Pt with nausea and vomiting.  Pt states that every time she eats she feels nauseated and feels like she is going to vomit.  Pt cannot remember last BM. Pt states she feels tired after eating.

## 2020-08-12 NOTE — ED Notes (Signed)
Provided water for pt and husband.

## 2020-08-12 NOTE — ED Notes (Signed)
Informed RN bed assigned 

## 2020-08-12 NOTE — ED Notes (Signed)
Pt to CT

## 2020-08-12 NOTE — ED Notes (Signed)
EDP at bedside.  Husband stating that pt has abdominal pain and nausea each time after eating and that they don't know when LBM was. Pt does not remember. Husband also concerned because after each time pt eats, she feels tired and lays down.   Nausea started today, only after eating. Abdominal pain has been ongoing but pt and husband unable to provide specific timeframe.  Denies fever, SOB, CP, urinary symptoms.

## 2020-08-12 NOTE — Plan of Care (Signed)

## 2020-08-13 DIAGNOSIS — N179 Acute kidney failure, unspecified: Secondary | ICD-10-CM | POA: Diagnosis not present

## 2020-08-13 LAB — BASIC METABOLIC PANEL
Anion gap: 6 (ref 5–15)
BUN: 48 mg/dL — ABNORMAL HIGH (ref 8–23)
CO2: 24 mmol/L (ref 22–32)
Calcium: 8.6 mg/dL — ABNORMAL LOW (ref 8.9–10.3)
Chloride: 109 mmol/L (ref 98–111)
Creatinine, Ser: 1.61 mg/dL — ABNORMAL HIGH (ref 0.44–1.00)
GFR, Estimated: 32 mL/min — ABNORMAL LOW (ref >60)
Glucose, Bld: 86 mg/dL (ref 70–99)
Potassium: 4.8 mmol/L (ref 3.5–5.1)
Sodium: 139 mmol/L (ref 135–145)

## 2020-08-13 LAB — SARS CORONAVIRUS 2 (TAT 6-24 HRS): SARS Coronavirus 2: NEGATIVE

## 2020-08-13 MED ORDER — PANTOPRAZOLE SODIUM 40 MG PO TBEC: 40.0000 mg | DELAYED_RELEASE_TABLET | Freq: Every day | ORAL | 3 refills | Status: DC

## 2020-08-13 MED ORDER — LACTATED RINGERS IV SOLN: INTRAVENOUS | Status: DC

## 2020-08-13 NOTE — Plan of Care (Signed)
  Problem: Education: Goal: Knowledge of General Education information will improve Description: Including pain rating scale, medication(s)/side effects and non-pharmacologic comfort measures 08/13/2020 1630 by Conard Novak, RN Outcome: Adequate for Discharge 08/13/2020 1630 by Conard Novak, RN Outcome: Adequate for Discharge   Problem: Health Behavior/Discharge Planning: Goal: Ability to manage health-related needs will improve 08/13/2020 1630 by Conard Novak, RN Outcome: Adequate for Discharge 08/13/2020 1630 by Conard Novak, RN Outcome: Adequate for Discharge   Problem: Clinical Measurements: Goal: Ability to maintain clinical measurements within normal limits will improve 08/13/2020 1630 by Conard Novak, RN Outcome: Adequate for Discharge 08/13/2020 1630 by Conard Novak, RN Outcome: Adequate for Discharge Goal: Will remain free from infection 08/13/2020 1630 by Conard Novak, RN Outcome: Adequate for Discharge 08/13/2020 1630 by Conard Novak, RN Outcome: Adequate for Discharge Goal: Diagnostic test results will improve 08/13/2020 1630 by Conard Novak, RN Outcome: Adequate for Discharge 08/13/2020 1630 by Conard Novak, RN Outcome: Adequate for Discharge Goal: Respiratory complications will improve 08/13/2020 1630 by Conard Novak, RN Outcome: Adequate for Discharge 08/13/2020 1630 by Conard Novak, RN Outcome: Adequate for Discharge Goal: Cardiovascular complication will be avoided 08/13/2020 1630 by Conard Novak, RN Outcome: Adequate for Discharge 08/13/2020 1630 by Conard Novak, RN Outcome: Adequate for Discharge   Problem: Activity: Goal: Risk for activity intolerance will decrease 08/13/2020 1630 by Conard Novak, RN Outcome: Adequate for Discharge 08/13/2020 1630 by Conard Novak, RN Outcome: Adequate for Discharge   Problem: Nutrition: Goal: Adequate nutrition will be maintained 08/13/2020 1630 by  Conard Novak, RN Outcome: Adequate for Discharge 08/13/2020 1630 by Conard Novak, RN Outcome: Adequate for Discharge   Problem: Coping: Goal: Level of anxiety will decrease 08/13/2020 1630 by Conard Novak, RN Outcome: Adequate for Discharge 08/13/2020 1630 by Conard Novak, RN Outcome: Adequate for Discharge   Problem: Elimination: Goal: Will not experience complications related to bowel motility 08/13/2020 1630 by Conard Novak, RN Outcome: Adequate for Discharge 08/13/2020 1630 by Conard Novak, RN Outcome: Adequate for Discharge Goal: Will not experience complications related to urinary retention 08/13/2020 1630 by Conard Novak, RN Outcome: Adequate for Discharge 08/13/2020 1630 by Conard Novak, RN Outcome: Adequate for Discharge   Problem: Pain Managment: Goal: General experience of comfort will improve 08/13/2020 1630 by Conard Novak, RN Outcome: Adequate for Discharge 08/13/2020 1630 by Conard Novak, RN Outcome: Adequate for Discharge   Problem: Safety: Goal: Ability to remain free from injury will improve 08/13/2020 1630 by Conard Novak, RN Outcome: Adequate for Discharge 08/13/2020 1630 by Conard Novak, RN Outcome: Adequate for Discharge   Problem: Skin Integrity: Goal: Risk for impaired skin integrity will decrease 08/13/2020 1630 by Conard Novak, RN Outcome: Adequate for Discharge 08/13/2020 1630 by Conard Novak, RN Outcome: Adequate for Discharge

## 2020-08-13 NOTE — Clinical Social Work Note (Signed)
Patient has orders to discharge home today. She was active with [facility] for PT, RN, aide prior to admission. Advanced representative is aware of discharge. No further concerns. CSW signing off.  Dayton Scrape, [place]

## 2020-08-13 NOTE — Discharge Summary (Signed)
Physician Discharge Summary  Sylvia Miller SWH:[id number] DOB: 1940/05/06 DOA: 08/12/2020  PCP: Dion Body, MD  Admit date: 08/12/2020 Discharge date: 08/13/2020  Admitted From: Home  Disposition:  Home   Recommendations for Outpatient Follow-up:  Follow up with PCP in 1 week Dr. Netty Starring: Please check BMP in 1 week Dr. Netty Starring: Please resume HCTZ and/or lisinopril if needed and Cr allows Dr. Netty Starring: Please confirm patient takes no NSAIDs, assist with finding alternatives if she does not Follow up with Gastroenterology [facility]: Please review gastritis treatment regimen and adjust as needed Dr. Netty Starring: Please refer patient for outpatient MRI abdomen with and without contrast for better characterization of her liver lesion        Home Health: None  Equipment/Devices: None new  Discharge Condition: Good  CODE STATUS: FULL Diet recommendation: Regular  Brief/Interim Summary: Sylvia Miller is an 80 y.o. F with dementia, home-dwelling, RA, MDS and recent COVID who presented with post-prandial pain, and weakness.  In the ER, found to have AKI.    For more detail, from H&P:  "Patient was admitted 1 month ago for fatigue, poor appetite, nausea, found to have mild AKI, resolved with IV fluids.  No explanation found at that time for GI symptoms, which appear to have resolved spontaneously.  Since discharge, she improved, was eating well, seemed have good energy with [facility] physical therapy, until about 1 week ago when husband noticed that she was complaining of abdominal pain after every meal, did not eat anything, was not taking her pills, and seemed more sleepy and sluggish.  Finally she was very weak and so he brought her to the ER.  In the ER, creatinine 1.85 (from baseline 0.9), potassium 5.2, lipase normal.  CT of the abdomen and pelvis without contrast showed no small bowel obstruction, no pancreatitis, and only a new lesion of the liver that was  consistent with hemangioma.  Patient was given IV fluids and antiemetics and the hospitalist service were asked to evaluate for AKI."        [place] DIAGNOSIS: AKI    Discharge Diagnoses:   Acute kidney injury Hyperkalemia Non-anion gap metabolic acidosis This appears to be from poor p.o. intake in setting of lisinopril, relative hypotension. Also, Mobic is in patient's outside medication lists, and family were unsure if she takes or not, they thought she did.    NSAIDs held, should be stopped.  Given IV fluids, and creatinine improving.  Able to take PO well, has close PCP follow up.  Push fluids.  HCTZ, lisinopril held at discharge.     Liver lesion Discussed with Radiology, this appears to have been present since 2019, has been growing, concerning for malignancy.  Colonoscopy is up to date in 2019, I have very low suspicion for colon primary.  Benign considerations include hemangioma. - RECOMMEND OUTPATIENT MRI WITH CONTRAST, discussed with patient and husband     Chronic abdominal pain, likely gastritis Observed overnight.  Titrated pantoprazole up to BID.  Today, able to eat breakfast and lunch with good appetite and no pain.  She has history severe reflux and also may be taking NSAIDs, and the description offered by husband (nausea, "sour stomach", epigastric/periumbilical pain, immediately with eating) sounds like gastritis.  No anemia.    Recommend uptitrating PPI for now, close GI follow up.  Hold NSAID    Rheumatoid arthritis Avoid NSAIDs   Dementia   Hypertension Blood pressure soft here.  Hold lisinopril, HCTZ at discharge.  Close PCP follow up  for restarting.   Myelodysplastic syndrome Macrocytosis noted, hemoglobin normal, mild leukocytosis, doubt infection.           Discharge Instructions  Discharge Instructions     Discharge instructions   Complete by: As directed    From Dr. Loleta Books: You were admitted for kidney  injury This was a mild and temporary event. It happened because you were dehydrated and also taking the medicine lisinopril (which is normally good for the kidneys, but can cause a kidney injury when you are dehyrated).  We treated you with fluids and your kidney function got better.  For now: 1. STOP taking hydrochlorothiazide/HCTZ and lisinopril  These are two of your blood pressure medicines HCTZ makes you more dehydrated and lisinopril is only safe when your kidney function is better It is okay if your blood pressure runs high for a week or two, Dr. Netty Starring can always restart these later  2. If you have any meloxicam (aka Mobic) at home, don't take it.   If you have any ibuprofen, Motrin, Advil, Aleve or naproxen also, avoid those (they are harsh on the kidney)  3. For the stomach, take the antacid medicine pantoprazole/Protonix 40 mg twice daily Take this twice daily for now, and go see Gastroenterology They can tell you if you can reduce to once daily or try another medicine  4. Make sure you are drinking enough fluids Drink 1.5 liters of water or Crystal lite per day  5. Go see Dr. Stephens Shire next week and ask him to check labs (check a "basic metabolic panel")  6. Ask Dr. Netty Starring about follow up imaging for the liver.  Like I said, this is probably nothing, but it needs follow up   Increase activity slowly   Complete by: As directed       Allergies as of 08/13/2020       Reactions   Pollen Extract Other (See Comments)   Nose runs, eyes itch, etc        Medication List     STOP taking these medications    albuterol 108 (90 Base) MCG/ACT inhaler Commonly known as: VENTOLIN HFA   hydrochlorothiazide 12.5 MG tablet Commonly known as: HYDRODIURIL   lisinopril 20 MG tablet Commonly known as: ZESTRIL       TAKE these medications    acetaminophen 325 MG tablet Commonly known as: TYLENOL Take 650 mg by mouth every 6 (six) hours as needed for moderate  pain or headache.   aspirin EC 81 MG tablet Take 81 mg by mouth daily.   atorvastatin 10 MG tablet Commonly known as: LIPITOR Take 5 mg by mouth daily. In the morning   BIOFREEZE EX Apply 1 application topically as needed (for knee/leg pain).   diclofenac sodium 1 % Gel Commonly known as: VOLTAREN Apply 2 g topically 4 (four) times daily as needed (pain). Apply to painful joint   donepezil 10 MG tablet Commonly known as: ARICEPT Take 10 mg by mouth at bedtime.   GenTeal Tears 0.1-0.2-0.3 % Soln Place 1 drop into both eyes daily as needed (dry eye).   memantine 10 MG tablet Commonly known as: NAMENDA Take 10 mg by mouth 2 (two) times daily.   mirtazapine 7.5 MG tablet Commonly known as: REMERON Take 7.5 mg by mouth at bedtime.   OCUVITE PO Take 0.5 tablets by mouth in the morning and at bedtime.   pantoprazole 40 MG tablet Commonly known as: PROTONIX Take 1 tablet (40 mg total) by mouth daily.  Follow-up Information     Dion Body, MD. Schedule an appointment as soon as possible for a visit in 1 week(s).   Specialty: Family Medicine Contact information: [place] [address] [phone]                Allergies  Allergen Reactions   Pollen Extract Other (See Comments)    Nose runs, eyes itch, etc       Procedures/Studies: CT Abdomen Pelvis Wo Contrast  Result Date: 08/12/2020 CLINICAL DATA:  Abdominal pain and nausea. EXAM: CT ABDOMEN AND PELVIS WITHOUT CONTRAST TECHNIQUE: Multidetector CT imaging of the abdomen and pelvis was performed following the standard protocol without IV contrast. COMPARISON:  June 27, 2016 FINDINGS: Lower chest: No acute abnormality. Hepatobiliary: A calcified granuloma is seen within the right lobe of the liver. A 12.2 cm x 9.5 cm area of low attenuation is seen throughout a large portion of the left lobe. This represents a new finding when compared to the prior  study. Status post cholecystectomy. No biliary dilatation. Pancreas: Unremarkable. No pancreatic ductal dilatation or surrounding inflammatory changes. Spleen: Normal in size without focal abnormality. Adrenals/Urinary Tract: Adrenal glands are unremarkable. Kidneys are normal in size, without renal calculi or hydronephrosis. A stable 2.8 cm diameter cyst is seen within the right kidney. An additional 1.7 cm diameter exophytic cyst is seen within the posterolateral aspect of the mid left kidney. The urinary bladder is empty and subsequently limited in evaluation. Stomach/Bowel: There is a small hiatal hernia. The appendix is surgically absent. No evidence of bowel wall thickening, distention, or inflammatory changes. Noninflamed diverticula are seen throughout the large bowel. Vascular/Lymphatic: Mild aortic atherosclerosis. No enlarged abdominal or pelvic lymph nodes. Reproductive: Status post hysterectomy. No adnexal masses. Other: There is a 2.0 cm x 1.7 cm fat containing umbilical hernia. No abdominopelvic ascites. Musculoskeletal: Multilevel degenerative changes are seen throughout the lumbar spine IMPRESSION: 1. Interval development of a large area of low attenuation within the left lobe of the liver which may represent a large hepatic cyst versus hemangioma. Correlation with contrast-enhanced abdomen pelvis CT is recommended to confirm the presence of benign imaging features. This recommendation follows ACR consensus guidelines: Managing Incidental Findings on Abdominal CT: White Paper of the ACR Incidental Findings Committee. J Am Coll Radiol 2010;7:754-773 2. Evidence of prior cholecystectomy. 3. Colonic diverticulosis. 4. Bilateral renal cysts. Electronically Signed   By: Virgina Norfolk M.D.   On: 08/12/2020 16:01      Subjective: Ate well.  Appetite good.  No vomiting. No fever.  No melena or hematochezia.  Discharge Exam: Vitals:   08/13/20 0700 08/13/20 1222  BP: 106/74 (!) 138/55  Pulse:  63 (!) 55  Resp: 17   Temp: 97.9 F (36.6 C) 98.8 F (37.1 C)  SpO2: 95% 100%   Vitals:   08/12/20 2041 08/13/20 0457 08/13/20 0700 08/13/20 1222  BP: 133/64 (!) 159/74 106/74 (!) 138/55  Pulse: 83 78 63 (!) 55  Resp: 20 18 17    Temp: 99 F (37.2 C) 98.7 F (37.1 C) 97.9 F (36.6 C) 98.8 F (37.1 C)  TempSrc: Oral Oral Oral Oral  SpO2: 99% 100% 95% 100%  Weight:      Height:        General: Pt is alert, awake, not in acute distress, able to transfer with minimal assitance.   Cardiovascular: RRR, nl S1-S2, no murmurs appreciated.   No LE edema.   Respiratory: Normal respiratory rate and  rhythm.  CTAB without rales or wheezes. Abdominal: Abdomen soft and non-tender in all quadrants, no rebound or guarding.  No distension or HSM.  No masses. Neuro/Psych: Strength symmetric in upper and lower extremities.  Judgment and insight appear impaired by dementia.   The results of significant diagnostics from this hospitalization (including imaging, microbiology, ancillary and laboratory) are listed below for reference.     Microbiology: Recent Results (from the past 240 hour(s))  SARS CORONAVIRUS 2 (TAT 6-24 HRS) Nasopharyngeal Nasopharyngeal Swab     Status: None   Collection Time: 08/12/20  4:36 PM   Specimen: Nasopharyngeal Swab  Result Value Ref Range Status   SARS Coronavirus 2 NEGATIVE NEGATIVE Final    Comment: (NOTE) SARS-CoV-2 target nucleic acids are NOT DETECTED.  The SARS-CoV-2 RNA is generally detectable in upper and lower respiratory specimens during the acute phase of infection. Negative results do not preclude SARS-CoV-2 infection, do not rule out co-infections with other pathogens, and should not be used as the sole basis for treatment or other patient management decisions. Negative results must be combined with clinical observations, patient history, and epidemiological information. The expected result is Negative.  Fact Sheet for  Patients: SugarRoll.be  Fact Sheet for Healthcare Providers: https://www.woods-mathews.com/  This test is not yet approved or cleared by the [place] FDA and  has been authorized for detection and/or diagnosis of SARS-CoV-2 by FDA under an Emergency Use Authorization (EUA). This EUA will remain  in effect (meaning this test can be used) for the duration of the COVID-19 declaration under Se ction 564(b)(1) of the Act, 21 U.S.C. section 360bbb-3(b)(1), unless the authorization is terminated or revoked sooner.  Performed at [facility], [place] [address] [place], [place] [address]      Labs: BNP (last 3 results) No results for input(s): BNP in the last 8760 hours. Basic Metabolic Panel: Recent Labs  Lab 08/12/20 1511 08/13/20 0446  NA 137 139  K 5.2* 4.8  CL 105 109  CO2 20* 24  GLUCOSE 110* 86  BUN 52* 48*  CREATININE 1.85* 1.61*  CALCIUM 9.0 8.6*   Liver Function Tests: Recent Labs  Lab 08/12/20 1511  AST 29  ALT 13  ALKPHOS 114  BILITOT 0.8  PROT 7.6  ALBUMIN 4.3   Recent Labs  Lab 08/12/20 1511  LIPASE 36   No results for input(s): AMMONIA in the last 168 hours. CBC: Recent Labs  Lab 08/12/20 1511  WBC 12.3*  HGB 12.2  HCT 35.2*  MCV 101.1*  PLT 204   Cardiac Enzymes: No results for input(s): CKTOTAL, CKMB, CKMBINDEX, TROPONINI in the last 168 hours. BNP: Invalid input(s): POCBNP CBG: No results for input(s): GLUCAP in the last 168 hours. D-Dimer No results for input(s): DDIMER in the last 72 hours. Hgb A1c No results for input(s): HGBA1C in the last 72 hours. Lipid Profile No results for input(s): CHOL, HDL, LDLCALC, TRIG, CHOLHDL, LDLDIRECT in the last 72 hours. Thyroid function studies No results for input(s): TSH, T4TOTAL, T3FREE, THYROIDAB in the last 72 hours.  Invalid input(s): FREET3 Anemia work up No results for input(s): VITAMINB12, FOLATE, FERRITIN, TIBC, IRON, RETICCTPCT in  the last 72 hours. Urinalysis    Component Value Date/Time   COLORURINE AMBER (A) 08/12/2020 1639   APPEARANCEUR CLOUDY (A) 08/12/2020 1639   LABSPEC 1.017 08/12/2020 1639   PHURINE 5.0 08/12/2020 1639   GLUCOSEU NEGATIVE 08/12/2020 1639   HGBUR MODERATE (A) 08/12/2020 1639   BILIRUBINUR NEGATIVE 08/12/2020 1639  KETONESUR NEGATIVE 08/12/2020 1639   PROTEINUR 30 (A) 08/12/2020 1639   NITRITE NEGATIVE 08/12/2020 1639   LEUKOCYTESUR TRACE (A) 08/12/2020 1639   Sepsis Labs Invalid input(s): PROCALCITONIN,  WBC,  LACTICIDVEN Microbiology Recent Results (from the past 240 hour(s))  SARS CORONAVIRUS 2 (TAT 6-24 HRS) Nasopharyngeal Nasopharyngeal Swab     Status: None   Collection Time: 08/12/20  4:36 PM   Specimen: Nasopharyngeal Swab  Result Value Ref Range Status   SARS Coronavirus 2 NEGATIVE NEGATIVE Final    Comment: (NOTE) SARS-CoV-2 target nucleic acids are NOT DETECTED.  The SARS-CoV-2 RNA is generally detectable in upper and lower respiratory specimens during the acute phase of infection. Negative results do not preclude SARS-CoV-2 infection, do not rule out co-infections with other pathogens, and should not be used as the sole basis for treatment or other patient management decisions. Negative results must be combined with clinical observations, patient history, and epidemiological information. The expected result is Negative.  Fact Sheet for Patients: SugarRoll.be  Fact Sheet for Healthcare Providers: https://www.woods-mathews.com/  This test is not yet approved or cleared by the [place] FDA and  has been authorized for detection and/or diagnosis of SARS-CoV-2 by FDA under an Emergency Use Authorization (EUA). This EUA will remain  in effect (meaning this test can be used) for the duration of the COVID-19 declaration under Se ction 564(b)(1) of the Act, 21 U.S.C. section 360bbb-3(b)(1), unless the authorization is  terminated or revoked sooner.  Performed at [facility], [place] [address] [place], [place] [address]      Time coordinating discharge: 35 minutes The [place] controlled substances registry was reviewed for this patient      30 Day Unplanned Readmission Risk Score    Flowsheet Row ED to Hosp-Admission (Discharged) from 07/06/2020 in [facility]  30 Day Unplanned Readmission Risk Score (%) 17.73 Filed at 07/08/2020 1200       This score is the patient's risk of an unplanned readmission within 30 days of being discharged (0 -100%). The score is based on dignosis, age, lab data, medications, orders, and past utilization.   Low:  0-14.9   Medium: 15-21.9   High: 22-29.9   Extreme: 30 and above             SIGNED:   Edwin Dada, MD  [facility] 08/13/2020, 4:10 PM

## 2020-08-13 NOTE — Care Management Obs Status (Signed)
[facility] NOTIFICATION   Patient Details  Name: Rewa Weissberg MRN: [id number] Date of Birth: 1940/11/29   Medicare Observation Status Notification Given:  Yes    Candie Chroman, LCSW 08/13/2020, 4:25 PM

## 2020-11-02 ENCOUNTER — Other Ambulatory Visit: Payer: Self-pay | Admitting: Gastroenterology

## 2020-11-02 DIAGNOSIS — K769 Liver disease, unspecified: Secondary | ICD-10-CM

## 2020-11-02 DIAGNOSIS — R1084 Generalized abdominal pain: Secondary | ICD-10-CM

## 2020-11-24 ENCOUNTER — Ambulatory Visit
Admission: RE | Admit: 2020-11-24 | Discharge: 2020-11-24 | Disposition: A | Discharge status: Home / Self Care | Payer: Medicare Other | Source: Ambulatory Visit | Attending: Gastroenterology | Admitting: Gastroenterology

## 2020-11-24 DIAGNOSIS — K769 Liver disease, unspecified: Secondary | ICD-10-CM | POA: Diagnosis present

## 2020-11-24 DIAGNOSIS — R1084 Generalized abdominal pain: Secondary | ICD-10-CM | POA: Insufficient documentation

## 2020-11-24 MED ORDER — IOHEXOL 350 MG/ML SOLN
75.0000 mL | Freq: Once | INTRAVENOUS | Status: AC | PRN
  Administered 2020-11-24: 75 mL via INTRAVENOUS

## 2020-12-08 ENCOUNTER — Other Ambulatory Visit: Payer: Self-pay | Admitting: Gastroenterology

## 2020-12-08 DIAGNOSIS — R160 Hepatomegaly, not elsewhere classified: Secondary | ICD-10-CM

## 2020-12-09 ENCOUNTER — Other Ambulatory Visit: Payer: Self-pay | Admitting: Internal Medicine

## 2020-12-15 ENCOUNTER — Other Ambulatory Visit: Payer: Self-pay | Admitting: Radiology

## 2020-12-16 ENCOUNTER — Other Ambulatory Visit: Payer: Self-pay

## 2020-12-16 ENCOUNTER — Ambulatory Visit
Admission: RE | Admit: 2020-12-16 | Discharge: 2020-12-16 | Disposition: A | Discharge status: Home / Self Care | Payer: Medicare Other | Source: Ambulatory Visit | Attending: Gastroenterology | Admitting: Gastroenterology

## 2020-12-16 DIAGNOSIS — Z79899 Other long term (current) drug therapy: Secondary | ICD-10-CM | POA: Diagnosis not present

## 2020-12-16 DIAGNOSIS — I10 Essential (primary) hypertension: Secondary | ICD-10-CM | POA: Diagnosis not present

## 2020-12-16 DIAGNOSIS — E7800 Pure hypercholesterolemia, unspecified: Secondary | ICD-10-CM | POA: Insufficient documentation

## 2020-12-16 DIAGNOSIS — Z7982 Long term (current) use of aspirin: Secondary | ICD-10-CM | POA: Diagnosis not present

## 2020-12-16 DIAGNOSIS — Z809 Family history of malignant neoplasm, unspecified: Secondary | ICD-10-CM | POA: Insufficient documentation

## 2020-12-16 DIAGNOSIS — Z8711 Personal history of peptic ulcer disease: Secondary | ICD-10-CM | POA: Diagnosis not present

## 2020-12-16 DIAGNOSIS — R160 Hepatomegaly, not elsewhere classified: Secondary | ICD-10-CM | POA: Diagnosis present

## 2020-12-16 DIAGNOSIS — Z801 Family history of malignant neoplasm of trachea, bronchus and lung: Secondary | ICD-10-CM | POA: Diagnosis not present

## 2020-12-16 DIAGNOSIS — C220 Liver cell carcinoma: Secondary | ICD-10-CM | POA: Diagnosis not present

## 2020-12-16 LAB — CBC
HCT: 34.1 % — ABNORMAL LOW (ref 36.0–46.0)
Hemoglobin: 10.9 g/dL — ABNORMAL LOW (ref 12.0–15.0)
MCH: 31.7 pg (ref 26.0–34.0)
MCHC: 32 g/dL (ref 30.0–36.0)
MCV: 99.1 fL (ref 80.0–100.0)
Platelets: 188 10*3/uL (ref 150–400)
RBC: 3.44 MIL/uL — ABNORMAL LOW (ref 3.87–5.11)
RDW: 13.7 % (ref 11.5–15.5)
WBC: 13.2 10*3/uL — ABNORMAL HIGH (ref 4.0–10.5)
nRBC: 0 % (ref 0.0–0.2)

## 2020-12-16 LAB — PROTIME-INR
INR: 1.1 (ref 0.8–1.2)
Prothrombin Time: 13.7 seconds (ref 11.4–15.2)

## 2020-12-16 MED ORDER — FENTANYL CITRATE (PF) 100 MCG/2ML IJ SOLN
INTRAMUSCULAR | Status: DC | PRN
  Administered 2020-12-16: 50 ug via INTRAVENOUS

## 2020-12-16 MED ORDER — SODIUM CHLORIDE 0.9 % IV SOLN: INTRAVENOUS | Status: DC

## 2020-12-16 MED ORDER — MIDAZOLAM HCL 2 MG/2ML IJ SOLN
INTRAMUSCULAR | Status: DC | PRN
  Administered 2020-12-16: 1 mg via INTRAVENOUS

## 2020-12-16 MED ORDER — HYDROCODONE-ACETAMINOPHEN 5-325 MG PO TABS
1.0000 | ORAL_TABLET | Freq: Four times a day (QID) | ORAL | Status: DC | PRN
  Administered 2020-12-16: 1 via ORAL

## 2020-12-16 NOTE — H&P (Signed)
Chief Complaint: Patient was seen in consultation today for liver mass at the request of Ok Edwards  Referring Physician(s): London,Christiane Henderson [place]  Supervising Physician: Markus Daft  Patient Status: [facility] - Out-pt  History of Present Illness: Sylvia Miller is a 80 y.o. female with complaints of epigastric pain and weight loss. CT imaging done 9/30 revealed left hepatic lobe mass. Patient has been seen by GI 9/29 and request received for image guided liver mass biopsy with moderate sedation. The patient does have a memory deficit and her husband is here today with her, history obtained from patient and husband as well as chart review.  The patient has had a H&P performed within the last 30 days, all history, medications, and exam have been reviewed. The patient denies any interval changes since the H&P.  The patient denies any chest pain, shortness of breath or palpitations. She has no known complications to sedation. She does admit to current left sided abdominal pain.   Past Medical History:  Diagnosis Date   Allergy    Anemia    Arthritis    rheumatoid /knees/hands/shoulders (Right shoulder) infusion every 5 weeks   Bladder incontinence    Chicken pox    Dyspnea    with exertion or far walking   Gastric ulcer    GERD (gastroesophageal reflux disease)    Heart murmur    Hypercholesteremia    Hyperlipidemia, unspecified    Hypertension    controlled with meds;    MDS (myelodysplastic syndrome) ([place]) 2000   Memory deficit    Myelodysplastic syndrome (HCC)    Rheumatoid arthritis ([place])    Ulcer     Past Surgical History:  Procedure Laterality Date   ABDOMINAL HYSTERECTOMY     APPENDECTOMY     as a child   CHOLECYSTECTOMY     COLONOSCOPY     COLONOSCOPY WITH PROPOFOL N/A 04/24/2017   Procedure: COLONOSCOPY WITH PROPOFOL;  Surgeon: Lollie Sails, MD;  Location: [facility] ENDOSCOPY;  Service: Endoscopy;  Laterality: N/A;    ESOPHAGOGASTRODUODENOSCOPY (EGD) WITH PROPOFOL N/A 04/24/2017   Procedure: ESOPHAGOGASTRODUODENOSCOPY (EGD) WITH PROPOFOL;  Surgeon: Lollie Sails, MD;  Location: [facility] ENDOSCOPY;  Service: Endoscopy;  Laterality: N/A;   JOINT REPLACEMENT     KNEE ARTHROPLASTY Right 02/21/2017   Procedure: COMPUTER ASSISTED TOTAL KNEE ARTHROPLASTY;  Surgeon: Dereck Leep, MD;  Location: [facility] ORS;  Service: Orthopedics;  Laterality: Right;   KNEE ARTHROPLASTY Left 08/06/2017   Procedure: COMPUTER ASSISTED TOTAL KNEE ARTHROPLASTY;  Surgeon: Dereck Leep, MD;  Location: [facility] ORS;  Service: Orthopedics;  Laterality: Left;   NM INTRAVENOUS INJECTION ([facility] HX)     receives biologic infusions every 5 weeks for rheumatoid arthritis. followed by dr. Meda Coffee at [facility] EXTRACTION      Allergies: Pollen extract  Medications: Prior to Admission medications   Medication Sig Start Date End Date Taking? Authorizing Provider  acetaminophen (TYLENOL) 325 MG tablet Take 650 mg by mouth every 6 (six) hours as needed for moderate pain or headache.    Yes [provider]  Artificial Tear Solution (GENTEAL TEARS) 0.1-0.2-0.3 % SOLN Place 1 drop into both eyes daily as needed (dry eye).    Yes [provider]  aspirin EC 81 MG tablet Take 81 mg by mouth daily.   Yes [provider]  atorvastatin (LIPITOR) 10 MG tablet Take 5 mg by mouth daily. In  the morning   Yes [provider]  donepezil (ARICEPT) 10 MG tablet Take 10 mg by mouth at bedtime. 07/30/20  Yes [provider]  memantine (NAMENDA) 10 MG tablet Take 10 mg by mouth 2 (two) times daily. 05/12/19  Yes [provider]  mirtazapine (REMERON) 7.5 MG tablet Take 7.5 mg by mouth at bedtime. 06/06/20  Yes [provider]  pantoprazole (PROTONIX) 40 MG tablet Take 1 tablet (40 mg total) by mouth daily. 08/13/20  Yes Danford, Suann Larry,  MD  diclofenac sodium (VOLTAREN) 1 % GEL Apply 2 g topically 4 (four) times daily as needed (pain). Apply to painful joint Patient not taking: Reported on 12/16/2020    [provider]  Menthol, Topical Analgesic, (BIOFREEZE EX) Apply 1 application topically as needed (for knee/leg pain). Patient not taking: Reported on 12/16/2020    [provider]  Multiple Vitamins-Minerals (OCUVITE PO) Take 0.5 tablets by mouth in the morning and at bedtime. Patient not taking: Reported on 12/16/2020    [provider]     Family History  Problem Relation Age of Onset   Cancer Mother    Lung cancer Mother    Lung cancer Father    Colon cancer Neg Hx    Colon polyps Neg Hx    Rectal cancer Neg Hx     Social History   Socioeconomic History   Marital status: Married    Spouse name: Not on file   Number of children: 2   Years of education: 14   Highest education level: Not on file  Occupational History   Not on file  Tobacco Use   Smoking status: Never   Smokeless tobacco: Never  Vaping Use   Vaping Use: Never used  Substance and Sexual Activity   Alcohol use: Yes    Comment: very occasional   Drug use: Never   Sexual activity: Yes  Other Topics Concern   Not on file  Social History Narrative   Not on file   Social Determinants of Health   Financial Resource Strain: Not on file  Food Insecurity: Not on file  Transportation Needs: Not on file  Physical Activity: Not on file  Stress: Not on file  Social Connections: Not on file    Review of Systems: A 12 point ROS discussed and pertinent positives are indicated in the HPI above.  All other systems are negative.  Review of Systems  Vital Signs: BP (!) 153/111   Pulse 73   Temp 98 F (36.7 C) (Oral)   Resp 18   Ht 4\' 9"  (1.448 m)   Wt 133 lb (60.3 kg)   SpO2 99%   BMI 28.78 kg/m   Physical Exam Constitutional:      Appearance: Normal appearance.  HENT:     Head: Normocephalic and  atraumatic.  Cardiovascular:     Rate and Rhythm: Normal rate and regular rhythm.  Pulmonary:     Effort: Pulmonary effort is normal. No respiratory distress.  Abdominal:     Palpations: Abdomen is soft.     Tenderness: There is abdominal tenderness.  Skin:    General: Skin is warm and dry.  Neurological:     Mental Status: Mental status is at baseline.     Comments: Alert, NAD, has memory deficit     Imaging: CT ABDOMEN PELVIS W WO CONTRAST  Addendum Date: 12/03/2020   ADDENDUM REPORT: 12/03/2020 10:57 ADDENDUM: Voice recognition error: The second sentence of the stomach/bowel findings  section should read "NO colonic mass or obstruction. " Electronically Signed   By: Suzy Bouchard M.D.   On: 12/03/2020 10:57   Result Date: 12/03/2020 CLINICAL DATA:  Enlarging hepatic lesion. EXAM: CT ABDOMEN AND PELVIS WITHOUT AND WITH CONTRAST TECHNIQUE: Multidetector CT imaging of the abdomen and pelvis was performed following the standard protocol before and following the bolus administration of intravenous contrast. CONTRAST:  21mL OMNIPAQUE IOHEXOL 350 MG/ML SOLN COMPARISON:  CT 08/12/2020, CT chest 05/24/2019 FINDINGS: Lower chest: Lung bases are clear. Hepatobiliary: Lobular mass occupying the near entirety of the lateral segment of the LEFT hepatic lobe continues to increase in size measuring 13.7 x 9.2 cm (image 29/5) compared to 12.1 by 9.5 cm. The mass demonstrates avid arterial enhancement in a heterogeneous pattern. There are regions of intense enhancement and some central regions of hypoenhancement. Venous phase imaging demonstrates more uniform enhancement with several regions of low attenuation. Similar findings on the delayed imaging. This pattern is not typical of hemangioma. Portal veins are patent.  No biliary duct dilatation. Pancreas: Pancreas is normal. No ductal dilatation. No pancreatic inflammation. Spleen: Normal spleen Adrenals/urinary tract: Adrenal glands normal. Several  nonenhancing cysts of the kidneys. Ureters and bladder normal. Stomach/Bowel: Stomach, duodenum small-bowel normal. Of colonic mass or obstruction. Diverticulosis sigmoid colon. Vascular/Lymphatic: No potential enlarged periportal lymph node measures 21 mm on image 52/10. Adjacent 14 mm node on image 55. Retroperitoneal adenopathy no pelvic adenopathy. No mesenteric adenopathy Reproductive: Post hysterectomy.  Adnexa unremarkable Other: No free fluid. Musculoskeletal: No aggressive osseous lesion. IMPRESSION: 1. Large infiltrative mass in the LEFT hepatic lobe does not have benign imaging characteristics. Favor primary hepatic or biliary malignancy such as hepatocellular carcinoma or infiltrative cholangiocarcinoma. Metastatic lesion can not be excluded. 2. Moderate periportal lymphadenopathy. 3. No evidence of colon cancer on CT imaging. 4. Recommend correlation with AFP and CEA levels and recommend tissue sampling. These results will be called to the ordering clinician or representative by the Radiologist Assistant, and communication documented in the PACS or [employer]. Electronically Signed: By: Suzy Bouchard M.D. On: 11/24/2020 16:55    Labs:  CBC: Recent Labs    07/07/20 0512 07/08/20 0654 08/12/20 1511 12/16/20 0916  WBC 6.2 5.6 12.3* 13.2*  HGB 10.6* 10.2* 12.2 10.9*  HCT 32.1* 30.3* 35.2* 34.1*  PLT 127* 125* 204 188    COAGS: Recent Labs    12/16/20 0916  INR 1.1    BMP: Recent Labs    07/07/20 0512 07/08/20 0654 08/12/20 1511 08/13/20 0446  NA 138 140 137 139  K 3.8 4.4 5.2* 4.8  CL 113* 107 105 109  CO2 17* 26 20* 24  GLUCOSE 78 93 110* 86  BUN 33* 26* 52* 48*  CALCIUM 8.3* 8.4* 9.0 8.6*  CREATININE 1.06* 0.87 1.85* 1.61*  GFRNONAA 53* >60 27* 32*    LIVER FUNCTION TESTS: Recent Labs    08/12/20 1511  BILITOT 0.8  AST 29  ALT 13  ALKPHOS 114  PROT 7.6  ALBUMIN 4.3    Assessment and Plan: 80 year old female with complaints of epigastric pain  and weight loss CT imaging done 9/30 revealed left hepatic lobe mass.  Patient has been seen by GI 9/29 and request received for image guided liver mass biopsy with moderate sedation.   The patient has been NPO, ASA 81 mg taken possibly yesterday- Dr. Anselm Pancoast stated ok to proceed, imaging, labs and vitals have been reviewed.  Risks and benefits of image guided liver  mass biopsy was discussed with the patient and/or patient's family including, but not limited to bleeding, bleeding requiring blood transfusion or additional procedures, infection, damage to adjacent structures or low yield requiring additional tests.  All of the questions were answered and there is agreement to proceed.  Consent signed and in chart.    Thank you for this interesting consult.  I greatly enjoyed meeting Sylvia Miller and look forward to participating in their care.  A copy of this report was sent to the requesting provider on this date.  Electronically Signed: Hedy Jacob, PA-C 12/16/2020, 10:09 AM   I spent a total of 15 Minutes in face to face in clinical consultation, greater than 50% of which was counseling/coordinating care for liver mass.

## 2020-12-16 NOTE — Procedures (Signed)
Interventional Radiology Procedure:   Indications: Liver mass    Procedure: US guided liver mass biopsy  Findings: Heterogenous left hepatic mass.  Multiple cores performed and 2 adequate specimens obtained.  Gelfoam slurry injected as needle was removed.  Complications: No immediate complications noted.     EBL: Minimal  Plan: Bedrest 3 hours   Sylvia Luu R. Anselm Pancoast, MD  Pager: [phone]

## 2020-12-21 LAB — SURGICAL PATHOLOGY

## 2020-12-22 ENCOUNTER — Other Ambulatory Visit: Payer: Self-pay

## 2020-12-22 ENCOUNTER — Telehealth: Payer: Self-pay

## 2020-12-22 DIAGNOSIS — C229 Malignant neoplasm of liver, not specified as primary or secondary: Secondary | ICD-10-CM

## 2020-12-22 NOTE — Telephone Encounter (Signed)
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 She has seen Dr. [name] previously. Referral placed for Dr. [name].

## 2020-12-24 ENCOUNTER — Other Ambulatory Visit: Payer: Self-pay

## 2020-12-24 ENCOUNTER — Telehealth: Payer: Self-pay

## 2020-12-24 ENCOUNTER — Encounter: Payer: Self-pay | Admitting: Oncology

## 2020-12-24 ENCOUNTER — Inpatient Hospital Stay: Payer: Medicare Other

## 2020-12-24 ENCOUNTER — Inpatient Hospital Stay: Payer: Medicare Other | Attending: Oncology | Admitting: Oncology

## 2020-12-24 VITALS — BP 166/74 | HR 85 | Temp 97.7°F | Resp 17 | Wt 133.0 lb

## 2020-12-24 DIAGNOSIS — C220 Liver cell carcinoma: Secondary | ICD-10-CM | POA: Insufficient documentation

## 2020-12-24 DIAGNOSIS — C229 Malignant neoplasm of liver, not specified as primary or secondary: Secondary | ICD-10-CM

## 2020-12-24 DIAGNOSIS — Z7982 Long term (current) use of aspirin: Secondary | ICD-10-CM | POA: Insufficient documentation

## 2020-12-24 DIAGNOSIS — R978 Other abnormal tumor markers: Secondary | ICD-10-CM | POA: Diagnosis not present

## 2020-12-24 DIAGNOSIS — G5621 Lesion of ulnar nerve, right upper limb: Secondary | ICD-10-CM

## 2020-12-24 DIAGNOSIS — D469 Myelodysplastic syndrome, unspecified: Secondary | ICD-10-CM | POA: Diagnosis not present

## 2020-12-24 DIAGNOSIS — I10 Essential (primary) hypertension: Secondary | ICD-10-CM | POA: Insufficient documentation

## 2020-12-24 DIAGNOSIS — Z79899 Other long term (current) drug therapy: Secondary | ICD-10-CM | POA: Insufficient documentation

## 2020-12-24 DIAGNOSIS — C221 Intrahepatic bile duct carcinoma: Secondary | ICD-10-CM

## 2020-12-24 LAB — CBC WITH DIFFERENTIAL/PLATELET
Abs Immature Granulocytes: 0.08 10*3/uL — ABNORMAL HIGH (ref 0.00–0.07)
Basophils Absolute: 0.1 10*3/uL (ref 0.0–0.1)
Basophils Relative: 0 %
Eosinophils Absolute: 0.1 10*3/uL (ref 0.0–0.5)
Eosinophils Relative: 0 %
HCT: 31.7 % — ABNORMAL LOW (ref 36.0–46.0)
Hemoglobin: 10.1 g/dL — ABNORMAL LOW (ref 12.0–15.0)
Immature Granulocytes: 1 %
Lymphocytes Relative: 20 %
Lymphs Abs: 2.6 10*3/uL (ref 0.7–4.0)
MCH: 31.7 pg (ref 26.0–34.0)
MCHC: 31.9 g/dL (ref 30.0–36.0)
MCV: 99.4 fL (ref 80.0–100.0)
Monocytes Absolute: 1.3 10*3/uL — ABNORMAL HIGH (ref 0.1–1.0)
Monocytes Relative: 10 %
Neutro Abs: 8.7 10*3/uL — ABNORMAL HIGH (ref 1.7–7.7)
Neutrophils Relative %: 69 %
Platelets: 229 10*3/uL (ref 150–400)
RBC: 3.19 MIL/uL — ABNORMAL LOW (ref 3.87–5.11)
RDW: 14.3 % (ref 11.5–15.5)
WBC: 12.8 10*3/uL — ABNORMAL HIGH (ref 4.0–10.5)
nRBC: 0 % (ref 0.0–0.2)

## 2020-12-24 LAB — COMPREHENSIVE METABOLIC PANEL
ALT: 23 U/L (ref 0–44)
AST: 31 U/L (ref 15–41)
Albumin: 3.9 g/dL (ref 3.5–5.0)
Alkaline Phosphatase: 201 U/L — ABNORMAL HIGH (ref 38–126)
Anion gap: 13 (ref 5–15)
BUN: 15 mg/dL (ref 8–23)
CO2: 25 mmol/L (ref 22–32)
Calcium: 9.4 mg/dL (ref 8.9–10.3)
Chloride: 98 mmol/L (ref 98–111)
Creatinine, Ser: 0.86 mg/dL (ref 0.44–1.00)
GFR, Estimated: >60 mL/min (ref >60)
Glucose, Bld: 96 mg/dL (ref 70–99)
Potassium: 3.5 mmol/L (ref 3.5–5.1)
Sodium: 136 mmol/L (ref 135–145)
Total Bilirubin: 0.9 mg/dL (ref 0.3–1.2)
Total Protein: 8.1 g/dL (ref 6.5–8.1)

## 2020-12-24 LAB — IRON AND TIBC
Iron: 26 ug/dL — ABNORMAL LOW (ref 28–170)
Saturation Ratios: 10 % — ABNORMAL LOW (ref 10.4–31.8)
TIBC: 251 ug/dL (ref 250–450)
UIBC: 225 ug/dL

## 2020-12-24 LAB — FERRITIN: Ferritin: 426 ng/mL — ABNORMAL HIGH (ref 11–307)

## 2020-12-24 MED ORDER — OXYCODONE HCL 5 MG PO TABS: 5.0000 mg | ORAL_TABLET | Freq: Four times a day (QID) | ORAL | 0 refills | Status: DC | PRN

## 2020-12-24 NOTE — Telephone Encounter (Signed)
Omniseq Testing request sent via fax to [facility] pathology. Spoke to pathology who is aware of request being sent over.

## 2020-12-25 LAB — CEA: CEA: 2.3 ng/mL (ref 0.0–4.7)

## 2020-12-25 LAB — AFP TUMOR MARKER: AFP, Serum, Tumor Marker: 6.5 ng/mL (ref 0.0–9.2)

## 2020-12-27 ENCOUNTER — Telehealth: Payer: Self-pay | Admitting: Oncology

## 2020-12-27 LAB — CA 19-9 (SERIAL): CA 19-9: 1299 U/mL — ABNORMAL HIGH (ref 0–35)

## 2020-12-27 NOTE — Telephone Encounter (Signed)
Spoke with patient's daughter Sylvia Miller to give her information about scheduled PET scan and to see if the family was able to discuss plans over the weekend. They were previously unsure about whether patient would receive treatment here in [place] or in [place] (where her daughter Sylvia Miller resides). Daughter stated that she would like to see whether or not cancer has spread before making that determination. She thanked me for calling and did a request an earlier PET scan but I explained to her this was unlikely due to it requiring authorization.   I also spoke with patient's husband to give him details about the PET scan. He expressed understanding about the prep/location.

## 2021-01-02 NOTE — Progress Notes (Signed)
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  She had a CT scan of the abdomen which showed 12.2 cm liver lesion as compared to 6.8 cm lesion in 2021 and 2.2 cm lesion in 2019.  This was followed by a repeat CT scan in September 2022 which showed further enlargement of the mass to 13.7 x 9.2 cm which favors primary pancreaticobiliary versus hepatic malignancy.  Moderate periportal adenopathy.  Patient had an ultrasound-guided biopsy of the level which was consistent with adenocarcinoma.  Cells positive for CK7 and CK19 and negative for CDX2 GATA3 PAX8 and TTF-1.  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  Patient is here with her husband today.  She has been fatigued most of the day and reports occasional abdominal pain.  Her 1 daughter lives in [place] and other family including daughter lives locally in [place].  ECOG PS- 2  Pain scale- 2   Review of systems- Review of Systems  Constitutional:  Positive for malaise/fatigue. Negative for chills, fever and weight loss.  HENT:  Negative for congestion, ear discharge and nosebleeds.   Eyes:  Negative for blurred vision.  Respiratory:  Negative for cough, hemoptysis, sputum production, shortness of breath and wheezing.   Cardiovascular:  Negative for chest pain, palpitations, orthopnea and claudication.   Gastrointestinal:  Positive for abdominal pain. Negative for blood in stool, constipation, diarrhea, heartburn, melena, nausea and vomiting.  Genitourinary:  Negative for dysuria, flank pain, frequency, hematuria and urgency.  Musculoskeletal:  Negative for back pain, joint pain and myalgias.  Skin:  Negative for rash.  Neurological:  Negative for dizziness, tingling, focal weakness, seizures, weakness and headaches.  Endo/Heme/Allergies:  Does not bruise/bleed easily.  Psychiatric/Behavioral:  Negative for depression and suicidal ideas. The patient does not have insomnia.    Allergies  Allergen Reactions   Pollen Extract Other (See Comments)    Nose runs, eyes itch, etc    Patient Active Problem List   Diagnosis Date Noted   Hyperkalemia 08/12/2020   Abdominal pain 08/12/2020   AKI (acute kidney injury) ([place]) 07/06/2020   COVID-19 virus detected 07/06/2020   Dementia without behavioral disturbance ([place]) 07/06/2020   Protein-calorie malnutrition ([place]) 08/27/2017   S/P total knee arthroplasty 08/06/2017   Cubital canal compression syndrome, right 04/18/2017   Primary osteoarthritis of knee 03/25/2017   Status post total right knee replacement 02/21/2017   Pure hypercholesterolemia 08/17/2016   MDS (myelodysplastic syndrome) ([place]) 05/12/2016   B12 deficiency 05/04/2016   Essential hypertension 05/04/2016   Mixed stress and urge urinary incontinence 05/04/2016   Rheumatoid arthritis involving multiple sites with positive rheumatoid factor ([place]) 05/04/2016     Past Medical History:  Diagnosis Date   Allergy    Anemia    Arthritis    rheumatoid /knees/hands/shoulders (Right shoulder) infusion every 5 weeks   Bladder incontinence    Chicken pox    Dyspnea    with exertion or far walking   Gastric ulcer  GERD (gastroesophageal reflux disease)    Heart murmur    Hypercholesteremia    Hyperlipidemia, unspecified    Hypertension    controlled with meds;    MDS  (myelodysplastic syndrome) ([place]) 2000   Memory deficit    Myelodysplastic syndrome ([place])    Rheumatoid arthritis ([place])    Ulcer      Past Surgical History:  Procedure Laterality Date   ABDOMINAL HYSTERECTOMY     APPENDECTOMY     as a child   CHOLECYSTECTOMY     COLONOSCOPY     COLONOSCOPY WITH PROPOFOL N/A 04/24/2017   Procedure: COLONOSCOPY WITH PROPOFOL;  Surgeon: Lollie Sails, MD;  Location: [facility] ENDOSCOPY;  Service: Endoscopy;  Laterality: N/A;   ESOPHAGOGASTRODUODENOSCOPY (EGD) WITH PROPOFOL N/A 04/24/2017   Procedure: ESOPHAGOGASTRODUODENOSCOPY (EGD) WITH PROPOFOL;  Surgeon: Lollie Sails, MD;  Location: [facility] ENDOSCOPY;  Service: Endoscopy;  Laterality: N/A;   JOINT REPLACEMENT     KNEE ARTHROPLASTY Right 02/21/2017   Procedure: COMPUTER ASSISTED TOTAL KNEE ARTHROPLASTY;  Surgeon: Dereck Leep, MD;  Location: [facility] ORS;  Service: Orthopedics;  Laterality: Right;   KNEE ARTHROPLASTY Left 08/06/2017   Procedure: COMPUTER ASSISTED TOTAL KNEE ARTHROPLASTY;  Surgeon: Dereck Leep, MD;  Location: [facility] ORS;  Service: Orthopedics;  Laterality: Left;   NM INTRAVENOUS INJECTION ([facility] HX)     receives biologic infusions every 5 weeks for rheumatoid arthritis. followed by dr. Meda Coffee at [facility]   TONSILLECTOMY     UPPER GASTROINTESTINAL ENDOSCOPY     WISDOM TOOTH EXTRACTION      Social History   Socioeconomic History   Marital status: Married    Spouse name: Not on file   Number of children: 2   Years of education: 14   Highest education level: Not on file  Occupational History   Not on file  Tobacco Use   Smoking status: Never   Smokeless tobacco: Never  Vaping Use   Vaping Use: Never used  Substance and Sexual Activity   Alcohol use: Yes    Comment: very occasional   Drug use: Never   Sexual activity: Yes  Other Topics Concern   Not on file  Social History Narrative   Not on file   Social Determinants of Health   Financial Resource Strain: Not on  file  Food Insecurity: Not on file  Transportation Needs: Not on file  Physical Activity: Not on file  Stress: Not on file  Social Connections: Not on file  Intimate Partner Violence: Not on file     Family History  Problem Relation Age of Onset   Cancer Mother    Lung cancer Mother    Lung cancer Father    Colon cancer Neg Hx    Colon polyps Neg Hx    Rectal cancer Neg Hx      Current Outpatient Medications:    acetaminophen (TYLENOL) 325 MG tablet, Take 650 mg by mouth every 6 (six) hours as needed for moderate pain or headache. , Disp: , Rfl:    Artificial Tear Solution (GENTEAL TEARS) 0.1-0.2-0.3 % SOLN, Place 1 drop into both eyes daily as needed (dry eye). , Disp: , Rfl:    aspirin EC 81 MG tablet, Take 81 mg by mouth daily., Disp: , Rfl:    atorvastatin (LIPITOR) 10 MG tablet, Take 5 mg by mouth daily. In the morning, Disp: , Rfl:    diclofenac sodium (VOLTAREN) 1 % GEL, Apply 2 g topically 4 (four) times daily as  needed (pain). Apply to painful joint, Disp: , Rfl:    ferrous sulfate 325 (65 FE) MG tablet, Take 325 mg by mouth daily with breakfast., Disp: , Rfl:    fluticasone (FLONASE) 50 MCG/ACT nasal spray, Place into both nostrils daily., Disp: , Rfl:    hydrochlorothiazide (HYDRODIURIL) 12.5 MG tablet, Take 12.5 mg by mouth daily., Disp: , Rfl:    lisinopril (ZESTRIL) 20 MG tablet, Take 20 mg by mouth daily. For BP., Disp: , Rfl:    meloxicam (MOBIC) 7.5 MG tablet, Take 7.5 mg by mouth daily. For muscle pains., Disp: , Rfl:    memantine (NAMENDA) 10 MG tablet, Take 10 mg by mouth 2 (two) times daily., Disp: , Rfl:    Menthol, Topical Analgesic, (BIOFREEZE EX), Apply 1 application topically as needed (for knee/leg pain)., Disp: , Rfl:    mirtazapine (REMERON) 7.5 MG tablet, Take 7.5 mg by mouth at bedtime., Disp: , Rfl:    oxybutynin (DITROPAN-XL) 10 MG 24 hr tablet, Take 10 mg by mouth at bedtime. Urinary Frequency., Disp: , Rfl:    pantoprazole (PROTONIX) 40 MG tablet,  Take 1 tablet (40 mg total) by mouth daily., Disp: 60 tablet, Rfl: 3   sulfaSALAzine (AZULFIDINE) 500 MG tablet, Take 500 mg by mouth 2 (two) times daily. Anti-Inflammatory for 90 days., Disp: , Rfl:    donepezil (ARICEPT) 10 MG tablet, Take 10 mg by mouth at bedtime. (Patient not taking: Reported on 12/24/2020), Disp: , Rfl:    Multiple Vitamins-Minerals (OCUVITE PO), Take 0.5 tablets by mouth in the morning and at bedtime. (Patient not taking: No sig reported), Disp: , Rfl:    oxyCODONE (OXY IR/ROXICODONE) 5 MG immediate release tablet, Take 1 tablet (5 mg total) by mouth every 6 (six) hours as needed for severe pain., Disp: 60 tablet, Rfl: 0   Physical exam:  Vitals:   12/24/20 1038  BP: (!) 166/74  Pulse: 85  Resp: 17  Temp: 97.7 F (36.5 C)  SpO2: 100%  Weight: 133 lb (60.3 kg)   Physical Exam Constitutional:      Comments: She is sitting in a wheelchair.  Appears elderly and frail  Cardiovascular:     Rate and Rhythm: Normal rate and regular rhythm.     Heart sounds: Normal heart sounds.  Pulmonary:     Effort: Pulmonary effort is normal.     Breath sounds: Normal breath sounds.  Abdominal:     General: Bowel sounds are normal.     Palpations: Abdomen is soft.  Skin:    General: Skin is warm and dry.  Neurological:     Mental Status: She is alert and oriented to person, place, and time.       CMP Latest Ref Rng & Units 12/24/2020  Glucose 70 - 99 mg/dL 96  BUN 8 - 23 mg/dL 15  Creatinine 0.44 - 1.00 mg/dL 0.86  Sodium 135 - 145 mmol/L 136  Potassium 3.5 - 5.1 mmol/L 3.5  Chloride 98 - 111 mmol/L 98  CO2 22 - 32 mmol/L 25  Calcium 8.9 - 10.3 mg/dL 9.4  Total Protein 6.5 - 8.1 g/dL 8.1  Total Bilirubin 0.3 - 1.2 mg/dL 0.9  Alkaline Phos 38 - 126 U/L 201(H)  AST 15 - 41 U/L 31  ALT 0 - 44 U/L 23   CBC Latest Ref Rng & Units 12/24/2020  WBC 4.0 - 10.5 K/uL 12.8(H)  Hemoglobin 12.0 - 15.0 g/dL 10.1(L)  Hematocrit 36.0 - 46.0 % 31.7(L)  Platelets  150 - 400  K/uL 229    No images are attached to the encounter.  US BIOPSY (LIVER)  Result Date: 12/16/2020 INDICATION: 80 year old with a large left hepatic mass. Tissue diagnosis is needed. EXAM: ULTRASOUND-GUIDED BIOPSY OF THE HEPATIC MASS MEDICATIONS: None. ANESTHESIA/SEDATION: Moderate (conscious) sedation was employed during this procedure. A total of Versed 1.0 mg and Fentanyl 50 mcg was administered intravenously. Moderate Sedation Time: 25 minutes. The patient's level of consciousness and vital signs were monitored continuously by radiology nursing throughout the procedure under my direct supervision. FLUOROSCOPY TIME:  Fluoroscopy Time: None COMPLICATIONS: None immediate. PROCEDURE: Informed written consent was obtained from the patient/family after a thorough discussion of the procedural risks, benefits and alternatives. A timeout was performed prior to the initiation of the procedure. Patient was placed supine. The anterior abdomen was evaluated with ultrasound. Large left hepatic mass was identified. The anterior abdomen was prepped with chlorhexidine and sterile field was created. Skin and soft tissues were anesthetized using 1% lidocaine. Small incision was made. Using ultrasound guidance, a 17 gauge coaxial needle was directed into the left hepatic mass. Attempted to transverse a small portion of normal liver prior to entering the mass lesion. A total of 5 core biopsies were performed with an 18 gauge device. Only 2 adequate core specimens were obtained. Specimens were placed in formalin. Gel-Foam slurry was injected through the 17 gauge needle as the needle was removed. No immediate bleeding or hematoma formation. FINDINGS: Heterogeneous left hepatic mass. Needle position confirmed within the lesion. Two adequate core biopsies were obtained. Gel-Foam slurry was injected without immediate bleeding or hematoma. IMPRESSION: Ultrasound-guided core biopsies of the left hepatic mass. Electronically Signed    By: Markus Daft M.D.   On: 12/16/2020 11:47    Assessment and plan- Patient is a 80 y.o. female with newly diagnosed intrahepatic cholangiocarcinoma  I have reviewed CT abdomen pelvis images independently and discussed findings with the patient which shows a large left hepatic lobe mass close to 13 cm with evidence of portacaval adenopathy.  Biopsy was consistent with adenocarcinoma that would be compatible with pancreaticobiliary origin including an intrahepatic cholangiocarcinoma.  Patient does not have any abnormality noted on her pancreas and her imaging and this appears to be more consistent with an intrahepatic cholangiocarcinoma.  At this time I would recommend getting a PET CT scan for complete staging work-up.  We will also send NGS testing on her tumor specimen as significant proportion of intrahepatic cholangiocarcinoma scan with FGFR or IDH1 mutation.  Even if there is no evidence of distant metastatic disease given patient's age frailty and size of the mass I do not think that she would be a candidate for surgery or transplant.  I will discuss her treatment options in further detail after PET CT scan results are back.  Systemic chemotherapy could be offered for locally advanced or metastatic disease.  However patient is elderly and frail and I do not think she would tolerate combination chemotherapy to start off with.  Consideration could be given to single agent gemcitabine chemotherapy down the line.  Abdominal pain: Likely secondary to malignancy.  Will prescribe as needed oxycodone for the same   Thank you for this kind referral and the opportunity to participate in the care of this patient   Visit Diagnosis 1. 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 MDS (myelodysplastic syndrome) ([facility])   3. Cubital canal compression syndrome, right   4. Essential hypertension   5. Other abnormal tumor  markers      Dr. Randa Evens, MD, MPH [facility] at [address] [facility] [phone] 01/02/2021

## 2021-01-02 NOTE — H&P (View-Only) (Signed)
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  She had a CT scan of the abdomen which showed 12.2 cm liver lesion as compared to 6.8 cm lesion in 2021 and 2.2 cm lesion in 2019.  This was followed by a repeat CT scan in September 2022 which showed further enlargement of the mass to 13.7 x 9.2 cm which favors primary pancreaticobiliary versus hepatic malignancy.  Moderate periportal adenopathy.  Patient had an ultrasound-guided biopsy of the level which was consistent with adenocarcinoma.  Cells positive for CK7 and CK19 and negative for CDX2 GATA3 PAX8 and TTF-1.  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  Patient is here with her husband today.  She has been fatigued most of the day and reports occasional abdominal pain.  Her 1 daughter lives in [place] and other family including daughter lives locally in [place].  ECOG PS- 2  Pain scale- 2   Review of systems- Review of Systems  Constitutional:  Positive for malaise/fatigue. Negative for chills, fever and weight loss.  HENT:  Negative for congestion, ear discharge and nosebleeds.   Eyes:  Negative for blurred vision.  Respiratory:  Negative for cough, hemoptysis, sputum production, shortness of breath and wheezing.   Cardiovascular:  Negative for chest pain, palpitations, orthopnea and claudication.   Gastrointestinal:  Positive for abdominal pain. Negative for blood in stool, constipation, diarrhea, heartburn, melena, nausea and vomiting.  Genitourinary:  Negative for dysuria, flank pain, frequency, hematuria and urgency.  Musculoskeletal:  Negative for back pain, joint pain and myalgias.  Skin:  Negative for rash.  Neurological:  Negative for dizziness, tingling, focal weakness, seizures, weakness and headaches.  Endo/Heme/Allergies:  Does not bruise/bleed easily.  Psychiatric/Behavioral:  Negative for depression and suicidal ideas. The patient does not have insomnia.    Allergies  Allergen Reactions   Pollen Extract Other (See Comments)    Nose runs, eyes itch, etc    Patient Active Problem List   Diagnosis Date Noted   Hyperkalemia 08/12/2020   Abdominal pain 08/12/2020   AKI (acute kidney injury) ([place]) 07/06/2020   COVID-19 virus detected 07/06/2020   Dementia without behavioral disturbance ([place]) 07/06/2020   Protein-calorie malnutrition ([place]) 08/27/2017   S/P total knee arthroplasty 08/06/2017   Cubital canal compression syndrome, right 04/18/2017   Primary osteoarthritis of knee 03/25/2017   Status post total right knee replacement 02/21/2017   Pure hypercholesterolemia 08/17/2016   MDS (myelodysplastic syndrome) ([place]) 05/12/2016   B12 deficiency 05/04/2016   Essential hypertension 05/04/2016   Mixed stress and urge urinary incontinence 05/04/2016   Rheumatoid arthritis involving multiple sites with positive rheumatoid factor ([place]) 05/04/2016     Past Medical History:  Diagnosis Date   Allergy    Anemia    Arthritis    rheumatoid /knees/hands/shoulders (Right shoulder) infusion every 5 weeks   Bladder incontinence    Chicken pox    Dyspnea    with exertion or far walking   Gastric ulcer  GERD (gastroesophageal reflux disease)    Heart murmur    Hypercholesteremia    Hyperlipidemia, unspecified    Hypertension    controlled with meds;    MDS  (myelodysplastic syndrome) ([place]) 2000   Memory deficit    Myelodysplastic syndrome ([place])    Rheumatoid arthritis ([place])    Ulcer      Past Surgical History:  Procedure Laterality Date   ABDOMINAL HYSTERECTOMY     APPENDECTOMY     as a child   CHOLECYSTECTOMY     COLONOSCOPY     COLONOSCOPY WITH PROPOFOL N/A 04/24/2017   Procedure: COLONOSCOPY WITH PROPOFOL;  Surgeon: Lollie Sails, MD;  Location: [facility] ENDOSCOPY;  Service: Endoscopy;  Laterality: N/A;   ESOPHAGOGASTRODUODENOSCOPY (EGD) WITH PROPOFOL N/A 04/24/2017   Procedure: ESOPHAGOGASTRODUODENOSCOPY (EGD) WITH PROPOFOL;  Surgeon: Lollie Sails, MD;  Location: [facility] ENDOSCOPY;  Service: Endoscopy;  Laterality: N/A;   JOINT REPLACEMENT     KNEE ARTHROPLASTY Right 02/21/2017   Procedure: COMPUTER ASSISTED TOTAL KNEE ARTHROPLASTY;  Surgeon: Dereck Leep, MD;  Location: [facility] ORS;  Service: Orthopedics;  Laterality: Right;   KNEE ARTHROPLASTY Left 08/06/2017   Procedure: COMPUTER ASSISTED TOTAL KNEE ARTHROPLASTY;  Surgeon: Dereck Leep, MD;  Location: [facility] ORS;  Service: Orthopedics;  Laterality: Left;   NM INTRAVENOUS INJECTION ([facility] HX)     receives biologic infusions every 5 weeks for rheumatoid arthritis. followed by dr. Meda Coffee at [facility]   TONSILLECTOMY     UPPER GASTROINTESTINAL ENDOSCOPY     WISDOM TOOTH EXTRACTION      Social History   Socioeconomic History   Marital status: Married    Spouse name: Not on file   Number of children: 2   Years of education: 14   Highest education level: Not on file  Occupational History   Not on file  Tobacco Use   Smoking status: Never   Smokeless tobacco: Never  Vaping Use   Vaping Use: Never used  Substance and Sexual Activity   Alcohol use: Yes    Comment: very occasional   Drug use: Never   Sexual activity: Yes  Other Topics Concern   Not on file  Social History Narrative   Not on file   Social Determinants of Health   Financial Resource Strain: Not on  file  Food Insecurity: Not on file  Transportation Needs: Not on file  Physical Activity: Not on file  Stress: Not on file  Social Connections: Not on file  Intimate Partner Violence: Not on file     Family History  Problem Relation Age of Onset   Cancer Mother    Lung cancer Mother    Lung cancer Father    Colon cancer Neg Hx    Colon polyps Neg Hx    Rectal cancer Neg Hx      Current Outpatient Medications:    acetaminophen (TYLENOL) 325 MG tablet, Take 650 mg by mouth every 6 (six) hours as needed for moderate pain or headache. , Disp: , Rfl:    Artificial Tear Solution (GENTEAL TEARS) 0.1-0.2-0.3 % SOLN, Place 1 drop into both eyes daily as needed (dry eye). , Disp: , Rfl:    aspirin EC 81 MG tablet, Take 81 mg by mouth daily., Disp: , Rfl:    atorvastatin (LIPITOR) 10 MG tablet, Take 5 mg by mouth daily. In the morning, Disp: , Rfl:    diclofenac sodium (VOLTAREN) 1 % GEL, Apply 2 g topically 4 (four) times daily as  needed (pain). Apply to painful joint, Disp: , Rfl:    ferrous sulfate 325 (65 FE) MG tablet, Take 325 mg by mouth daily with breakfast., Disp: , Rfl:    fluticasone (FLONASE) 50 MCG/ACT nasal spray, Place into both nostrils daily., Disp: , Rfl:    hydrochlorothiazide (HYDRODIURIL) 12.5 MG tablet, Take 12.5 mg by mouth daily., Disp: , Rfl:    lisinopril (ZESTRIL) 20 MG tablet, Take 20 mg by mouth daily. For BP., Disp: , Rfl:    meloxicam (MOBIC) 7.5 MG tablet, Take 7.5 mg by mouth daily. For muscle pains., Disp: , Rfl:    memantine (NAMENDA) 10 MG tablet, Take 10 mg by mouth 2 (two) times daily., Disp: , Rfl:    Menthol, Topical Analgesic, (BIOFREEZE EX), Apply 1 application topically as needed (for knee/leg pain)., Disp: , Rfl:    mirtazapine (REMERON) 7.5 MG tablet, Take 7.5 mg by mouth at bedtime., Disp: , Rfl:    oxybutynin (DITROPAN-XL) 10 MG 24 hr tablet, Take 10 mg by mouth at bedtime. Urinary Frequency., Disp: , Rfl:    pantoprazole (PROTONIX) 40 MG tablet,  Take 1 tablet (40 mg total) by mouth daily., Disp: 60 tablet, Rfl: 3   sulfaSALAzine (AZULFIDINE) 500 MG tablet, Take 500 mg by mouth 2 (two) times daily. Anti-Inflammatory for 90 days., Disp: , Rfl:    donepezil (ARICEPT) 10 MG tablet, Take 10 mg by mouth at bedtime. (Patient not taking: Reported on 12/24/2020), Disp: , Rfl:    Multiple Vitamins-Minerals (OCUVITE PO), Take 0.5 tablets by mouth in the morning and at bedtime. (Patient not taking: No sig reported), Disp: , Rfl:    oxyCODONE (OXY IR/ROXICODONE) 5 MG immediate release tablet, Take 1 tablet (5 mg total) by mouth every 6 (six) hours as needed for severe pain., Disp: 60 tablet, Rfl: 0   Physical exam:  Vitals:   12/24/20 1038  BP: (!) 166/74  Pulse: 85  Resp: 17  Temp: 97.7 F (36.5 C)  SpO2: 100%  Weight: 133 lb (60.3 kg)   Physical Exam Constitutional:      Comments: She is sitting in a wheelchair.  Appears elderly and frail  Cardiovascular:     Rate and Rhythm: Normal rate and regular rhythm.     Heart sounds: Normal heart sounds.  Pulmonary:     Effort: Pulmonary effort is normal.     Breath sounds: Normal breath sounds.  Abdominal:     General: Bowel sounds are normal.     Palpations: Abdomen is soft.  Skin:    General: Skin is warm and dry.  Neurological:     Mental Status: She is alert and oriented to person, place, and time.       CMP Latest Ref Rng & Units 12/24/2020  Glucose 70 - 99 mg/dL 96  BUN 8 - 23 mg/dL 15  Creatinine 0.44 - 1.00 mg/dL 0.86  Sodium 135 - 145 mmol/L 136  Potassium 3.5 - 5.1 mmol/L 3.5  Chloride 98 - 111 mmol/L 98  CO2 22 - 32 mmol/L 25  Calcium 8.9 - 10.3 mg/dL 9.4  Total Protein 6.5 - 8.1 g/dL 8.1  Total Bilirubin 0.3 - 1.2 mg/dL 0.9  Alkaline Phos 38 - 126 U/L 201(H)  AST 15 - 41 U/L 31  ALT 0 - 44 U/L 23   CBC Latest Ref Rng & Units 12/24/2020  WBC 4.0 - 10.5 K/uL 12.8(H)  Hemoglobin 12.0 - 15.0 g/dL 10.1(L)  Hematocrit 36.0 - 46.0 % 31.7(L)  Platelets  150 - 400  K/uL 229    No images are attached to the encounter.  US BIOPSY (LIVER)  Result Date: 12/16/2020 INDICATION: 80 year old with a large left hepatic mass. Tissue diagnosis is needed. EXAM: ULTRASOUND-GUIDED BIOPSY OF THE HEPATIC MASS MEDICATIONS: None. ANESTHESIA/SEDATION: Moderate (conscious) sedation was employed during this procedure. A total of Versed 1.0 mg and Fentanyl 50 mcg was administered intravenously. Moderate Sedation Time: 25 minutes. The patient's level of consciousness and vital signs were monitored continuously by radiology nursing throughout the procedure under my direct supervision. FLUOROSCOPY TIME:  Fluoroscopy Time: None COMPLICATIONS: None immediate. PROCEDURE: Informed written consent was obtained from the patient/family after a thorough discussion of the procedural risks, benefits and alternatives. A timeout was performed prior to the initiation of the procedure. Patient was placed supine. The anterior abdomen was evaluated with ultrasound. Large left hepatic mass was identified. The anterior abdomen was prepped with chlorhexidine and sterile field was created. Skin and soft tissues were anesthetized using 1% lidocaine. Small incision was made. Using ultrasound guidance, a 17 gauge coaxial needle was directed into the left hepatic mass. Attempted to transverse a small portion of normal liver prior to entering the mass lesion. A total of 5 core biopsies were performed with an 18 gauge device. Only 2 adequate core specimens were obtained. Specimens were placed in formalin. Gel-Foam slurry was injected through the 17 gauge needle as the needle was removed. No immediate bleeding or hematoma formation. FINDINGS: Heterogeneous left hepatic mass. Needle position confirmed within the lesion. Two adequate core biopsies were obtained. Gel-Foam slurry was injected without immediate bleeding or hematoma. IMPRESSION: Ultrasound-guided core biopsies of the left hepatic mass. Electronically Signed    By: Markus Daft M.D.   On: 12/16/2020 11:47    Assessment and plan- Patient is a 80 y.o. female with newly diagnosed intrahepatic cholangiocarcinoma  I have reviewed CT abdomen pelvis images independently and discussed findings with the patient which shows a large left hepatic lobe mass close to 13 cm with evidence of portacaval adenopathy.  Biopsy was consistent with adenocarcinoma that would be compatible with pancreaticobiliary origin including an intrahepatic cholangiocarcinoma.  Patient does not have any abnormality noted on her pancreas and her imaging and this appears to be more consistent with an intrahepatic cholangiocarcinoma.  At this time I would recommend getting a PET CT scan for complete staging work-up.  We will also send NGS testing on her tumor specimen as significant proportion of intrahepatic cholangiocarcinoma scan with FGFR or IDH1 mutation.  Even if there is no evidence of distant metastatic disease given patient's age frailty and size of the mass I do not think that she would be a candidate for surgery or transplant.  I will discuss her treatment options in further detail after PET CT scan results are back.  Systemic chemotherapy could be offered for locally advanced or metastatic disease.  However patient is elderly and frail and I do not think she would tolerate combination chemotherapy to start off with.  Consideration could be given to single agent gemcitabine chemotherapy down the line.  Abdominal pain: Likely secondary to malignancy.  Will prescribe as needed oxycodone for the same   Thank you for this kind referral and the opportunity to participate in the care of this patient   Visit Diagnosis 1. 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 MDS (myelodysplastic syndrome) ([facility])   3. Cubital canal compression syndrome, right   4. Essential hypertension   5. Other abnormal tumor  markers      Dr. Randa Evens, MD, MPH [facility] at [address] [facility] [phone] 01/02/2021

## 2021-01-03 ENCOUNTER — Other Ambulatory Visit: Payer: Self-pay

## 2021-01-03 ENCOUNTER — Telehealth: Payer: Self-pay

## 2021-01-03 MED ORDER — LORAZEPAM 0.5 MG PO TABS: 0.5000 mg | ORAL_TABLET | Freq: Once | ORAL | 0 refills | Status: AC

## 2021-01-03 NOTE — Telephone Encounter (Signed)
Received request from Sylvia Miller that Sylvia Miller needs something her help her relax prior to her PET scan or she will not be able to lay that long. Sylvia Miller notified and Ativan 0.5 mg was sent to pharmacy. Called and educated on Ativan, when to take, and that she will need a driver after taking. Verbalized understanding and read back performed.

## 2021-01-03 NOTE — Telephone Encounter (Signed)
This patients husband just called and ended up on [place] phone line. He is requesting she have something "to calm her down" before her test tomorrow.  Patient's husband called requesting something "to calm her down" before the PET tomorrow.  MD approves Ativan 0.5 mg 1 dose to be taken 30 min prior to PET.    Ativan rx pended for MD to approve and send to pharmacy.

## 2021-01-04 ENCOUNTER — Other Ambulatory Visit: Payer: Self-pay

## 2021-01-04 ENCOUNTER — Ambulatory Visit
Admission: RE | Admit: 2021-01-04 | Discharge: 2021-01-04 | Disposition: A | Discharge status: Home / Self Care | Payer: Medicare Other | Source: Ambulatory Visit | Attending: Oncology | Admitting: Oncology

## 2021-01-04 DIAGNOSIS — C229 Malignant neoplasm of liver, not specified as primary or secondary: Secondary | ICD-10-CM | POA: Insufficient documentation

## 2021-01-04 LAB — GLUCOSE, CAPILLARY: Glucose-Capillary: 84 mg/dL (ref 70–99)

## 2021-01-04 MED ORDER — FLUDEOXYGLUCOSE F - 18 (FDG) INJECTION
6.9000 | Freq: Once | INTRAVENOUS | Status: AC | PRN
  Administered 2021-01-04: 7.51 via INTRAVENOUS

## 2021-01-10 ENCOUNTER — Encounter: Payer: Self-pay | Admitting: Oncology

## 2021-01-10 ENCOUNTER — Telehealth: Payer: Self-pay | Admitting: *Deleted

## 2021-01-10 NOTE — Telephone Encounter (Signed)
Called and spoke to husband and he had wanted a sooner appt to get pet scan results and he is ok to have video visit on this Thursday at 8:45. He says that his daughter owns [employer] and he and his wife will go there and go the video.

## 2021-01-13 ENCOUNTER — Inpatient Hospital Stay: Payer: Medicare Other | Attending: Oncology | Admitting: Oncology

## 2021-01-13 ENCOUNTER — Encounter: Payer: Self-pay | Admitting: Oncology

## 2021-01-13 ENCOUNTER — Other Ambulatory Visit: Payer: Self-pay

## 2021-01-13 ENCOUNTER — Telehealth (INDEPENDENT_AMBULATORY_CARE_PROVIDER_SITE_OTHER): Payer: Self-pay

## 2021-01-13 DIAGNOSIS — C229 Malignant neoplasm of liver, not specified as primary or secondary: Secondary | ICD-10-CM

## 2021-01-13 DIAGNOSIS — R059 Cough, unspecified: Secondary | ICD-10-CM | POA: Insufficient documentation

## 2021-01-13 DIAGNOSIS — R978 Other abnormal tumor markers: Secondary | ICD-10-CM | POA: Diagnosis not present

## 2021-01-13 DIAGNOSIS — Z79899 Other long term (current) drug therapy: Secondary | ICD-10-CM | POA: Insufficient documentation

## 2021-01-13 DIAGNOSIS — Z7952 Long term (current) use of systemic steroids: Secondary | ICD-10-CM | POA: Insufficient documentation

## 2021-01-13 DIAGNOSIS — R0989 Other specified symptoms and signs involving the circulatory and respiratory systems: Secondary | ICD-10-CM | POA: Insufficient documentation

## 2021-01-13 DIAGNOSIS — Z7189 Other specified counseling: Secondary | ICD-10-CM

## 2021-01-13 DIAGNOSIS — C221 Intrahepatic bile duct carcinoma: Secondary | ICD-10-CM | POA: Diagnosis not present

## 2021-01-13 DIAGNOSIS — Z7982 Long term (current) use of aspirin: Secondary | ICD-10-CM | POA: Insufficient documentation

## 2021-01-13 DIAGNOSIS — D469 Myelodysplastic syndrome, unspecified: Secondary | ICD-10-CM | POA: Insufficient documentation

## 2021-01-13 NOTE — Telephone Encounter (Signed)
Spoke with the patient's spouse and she is scheduled with Dr. Lucky Cowboy for a port placement on 01/17/21 with a 10:30 am arrival time to the [facility]. Pre-procedure instructions were discussed and will be mailed.

## 2021-01-16 ENCOUNTER — Encounter: Payer: Self-pay | Admitting: Oncology

## 2021-01-16 DIAGNOSIS — C221 Intrahepatic bile duct carcinoma: Secondary | ICD-10-CM | POA: Insufficient documentation

## 2021-01-16 DIAGNOSIS — Z7189 Other specified counseling: Secondary | ICD-10-CM | POA: Insufficient documentation

## 2021-01-16 MED ORDER — PROCHLORPERAZINE MALEATE 10 MG PO TABS: 10.0000 mg | ORAL_TABLET | Freq: Four times a day (QID) | ORAL | 1 refills | Status: DC | PRN

## 2021-01-16 MED ORDER — ONDANSETRON HCL 8 MG PO TABS: 8.0000 mg | ORAL_TABLET | Freq: Two times a day (BID) | ORAL | 1 refills | Status: DC | PRN

## 2021-01-16 MED ORDER — DEXAMETHASONE 4 MG PO TABS: 8.0000 mg | ORAL_TABLET | Freq: Every day | ORAL | 1 refills | Status: AC

## 2021-01-16 MED ORDER — LIDOCAINE-PRILOCAINE 2.5-2.5 % EX CREA: TOPICAL_CREAM | CUTANEOUS | 3 refills | Status: AC

## 2021-01-16 NOTE — Progress Notes (Signed)
I connected with Sylvia Miller on 01/16/21 at  8:45 AM EST by video enabled telemedicine visit and verified that I am speaking with the correct person using two identifiers.   I discussed the limitations, risks, security and privacy concerns of performing an evaluation and management service by telemedicine and the availability of in-person appointments. I also discussed with the patient that there may be a patient responsible charge related to this service. The patient expressed understanding and agreed to proceed.  Other persons participating in the visit and their role in the encounter: Patient's daughter, son-in-law and husband  Patient's location:  home Provider's location:  home  Chief Complaint: Discuss PET CT scan results and further management  History of present illness: patient is a 80 year old female who was [facility] by [facility] forSymptoms of epigastric pain and weight loss.  She had a CT scan of the abdomen which showed 12.2 cm liver lesion as compared to 6.8 cm lesion in 2021 and 2.2 cm lesion in 2019.  This was followed by a repeat CT scan in September 2022 which showed further enlargement of the mass to 13.7 x 9.2 cm which favors primary pancreaticobiliary versus hepatic malignancy.  Moderate periportal adenopathy.  Patient had an ultrasound-guided biopsy of the level which was consistent with adenocarcinoma.  Cells positive for CK7 and CK19 and negative for CDX2 GATA3 PAX8 and TTF-1.  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  PET CT scan showed dominant left hepatic lobe mass measuring 14.6 x 10.2 cm with an SUV of 11.4.  Porta hepatis nodes 2.2 cm with an SUV of 4.7 and adjacent gastrohepatic ligament lymph node 1.3 cm with an SUV of 4.7.  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  Tumor mutational burden not high.  PD-L1 less than 1%.  Microsatellite stable  Interval history  patient has ongoing fatigue.  Reports occasional abdominal pain.  Appetite is fair   Review of Systems  Constitutional:  Positive for malaise/fatigue. Negative for chills, fever and weight loss.  HENT:  Negative for congestion, ear discharge and nosebleeds.   Eyes:  Negative for blurred vision.  Respiratory:  Negative for cough, hemoptysis, sputum production, shortness of breath and wheezing.   Cardiovascular:  Negative for chest pain, palpitations, orthopnea and claudication.  Gastrointestinal:  Positive for abdominal pain. Negative for blood in stool, constipation, diarrhea, heartburn, melena, nausea and vomiting.  Genitourinary:  Negative for dysuria, flank pain, frequency, hematuria and urgency.  Musculoskeletal:  Negative for back pain, joint pain and myalgias.  Skin:  Negative for rash.  Neurological:  Negative for dizziness, tingling, focal weakness, seizures, weakness and headaches.  Endo/Heme/Allergies:  Does not bruise/bleed easily.  Psychiatric/Behavioral:  Negative for depression and suicidal ideas. The patient does not have insomnia.    Allergies  Allergen Reactions   Pollen Extract Other (See Comments)    Nose runs, eyes itch, etc    Past Medical History:  Diagnosis Date   Allergy    Anemia    Arthritis    rheumatoid /knees/hands/shoulders (Right shoulder) infusion every 5 weeks   Bladder incontinence    Chicken pox    Dyspnea    with exertion or far walking   Gastric ulcer    GERD (gastroesophageal reflux disease)    Heart murmur    Hypercholesteremia    Hyperlipidemia, unspecified    Hypertension    controlled with meds;    MDS (myelodysplastic syndrome) ([place]) 2000  Memory deficit    Myelodysplastic syndrome ([place])    Rheumatoid arthritis ([place])    Ulcer     Past Surgical History:  Procedure Laterality Date   ABDOMINAL HYSTERECTOMY     APPENDECTOMY     as a child   CHOLECYSTECTOMY     COLONOSCOPY     COLONOSCOPY WITH PROPOFOL N/A 04/24/2017    Procedure: COLONOSCOPY WITH PROPOFOL;  Surgeon: Lollie Sails, MD;  Location: [facility] ENDOSCOPY;  Service: Endoscopy;  Laterality: N/A;   ESOPHAGOGASTRODUODENOSCOPY (EGD) WITH PROPOFOL N/A 04/24/2017   Procedure: ESOPHAGOGASTRODUODENOSCOPY (EGD) WITH PROPOFOL;  Surgeon: Lollie Sails, MD;  Location: [facility] ENDOSCOPY;  Service: Endoscopy;  Laterality: N/A;   JOINT REPLACEMENT     KNEE ARTHROPLASTY Right 02/21/2017   Procedure: COMPUTER ASSISTED TOTAL KNEE ARTHROPLASTY;  Surgeon: Dereck Leep, MD;  Location: [facility] ORS;  Service: Orthopedics;  Laterality: Right;   KNEE ARTHROPLASTY Left 08/06/2017   Procedure: COMPUTER ASSISTED TOTAL KNEE ARTHROPLASTY;  Surgeon: Dereck Leep, MD;  Location: [facility] ORS;  Service: Orthopedics;  Laterality: Left;   NM INTRAVENOUS INJECTION ([facility] HX)     receives biologic infusions every 5 weeks for rheumatoid arthritis. followed by dr. Meda Coffee at [facility] clinic   TONSILLECTOMY     UPPER GASTROINTESTINAL ENDOSCOPY     WISDOM TOOTH EXTRACTION      Social History   Socioeconomic History   Marital status: Married    Spouse name: Not on file   Number of children: 2   Years of education: 14   Highest education level: Not on file  Occupational History   Not on file  Tobacco Use   Smoking status: Never   Smokeless tobacco: Never  Vaping Use   Vaping Use: Never used  Substance and Sexual Activity   Alcohol use: Yes    Comment: very occasional   Drug use: Never   Sexual activity: Yes  Other Topics Concern   Not on file  Social History Narrative   Not on file   Social Determinants of Health   Financial Resource Strain: Not on file  Food Insecurity: Not on file  Transportation Needs: Not on file  Physical Activity: Not on file  Stress: Not on file  Social Connections: Not on file  Intimate Partner Violence: Not on file    Family History  Problem Relation Age of Onset   Cancer Mother    Lung cancer Mother    Lung cancer Father    Colon cancer  Neg Hx    Colon polyps Neg Hx    Rectal cancer Neg Hx      Current Outpatient Medications:    acetaminophen (TYLENOL) 325 MG tablet, Take 650 mg by mouth every 6 (six) hours as needed for moderate pain or headache. , Disp: , Rfl:    Artificial Tear Solution (GENTEAL TEARS) 0.1-0.2-0.3 % SOLN, Place 1 drop into both eyes daily as needed (dry eye). , Disp: , Rfl:    aspirin EC 81 MG tablet, Take 81 mg by mouth daily., Disp: , Rfl:    diclofenac sodium (VOLTAREN) 1 % GEL, Apply 2 g topically 4 (four) times daily as needed (pain). Apply to painful joint, Disp: , Rfl:    ferrous sulfate 325 (65 FE) MG tablet, Take 325 mg by mouth daily with breakfast., Disp: , Rfl:    hydrochlorothiazide (HYDRODIURIL) 12.5 MG tablet, Take 12.5 mg by mouth daily., Disp: , Rfl:    LORazepam (ATIVAN) 0.5 MG tablet, Take 0.5 mg by mouth  once., Disp: , Rfl:    meloxicam (MOBIC) 7.5 MG tablet, Take 7.5 mg by mouth daily. For muscle pains., Disp: , Rfl:    memantine (NAMENDA) 10 MG tablet, Take 10 mg by mouth 2 (two) times daily., Disp: , Rfl:    Menthol, Topical Analgesic, (BIOFREEZE EX), Apply 1 application topically as needed (for knee/leg pain)., Disp: , Rfl:    mirtazapine (REMERON) 7.5 MG tablet, Take 7.5 mg by mouth at bedtime., Disp: , Rfl:    Multiple Vitamins-Minerals (OCUVITE PO), Take 0.5 tablets by mouth in the morning and at bedtime., Disp: , Rfl:    oxybutynin (DITROPAN-XL) 10 MG 24 hr tablet, Take 10 mg by mouth at bedtime. Urinary Frequency., Disp: , Rfl:    oxyCODONE (OXY IR/ROXICODONE) 5 MG immediate release tablet, Take 1 tablet (5 mg total) by mouth every 6 (six) hours as needed for severe pain., Disp: 60 tablet, Rfl: 0   pantoprazole (PROTONIX) 40 MG tablet, Take 1 tablet (40 mg total) by mouth daily., Disp: 60 tablet, Rfl: 3   sulfaSALAzine (AZULFIDINE) 500 MG tablet, Take 500 mg by mouth 2 (two) times daily. Anti-Inflammatory for 90 days., Disp: , Rfl:    triamcinolone cream (KENALOG) 0.5 %, Apply  topically., Disp: , Rfl:    atorvastatin (LIPITOR) 10 MG tablet, Take 5 mg by mouth daily. In the morning (Patient not taking: Reported on 01/13/2021), Disp: , Rfl:    donepezil (ARICEPT) 10 MG tablet, Take 10 mg by mouth at bedtime. (Patient not taking: No sig reported), Disp: , Rfl:    fluticasone (FLONASE) 50 MCG/ACT nasal spray, Place into both nostrils daily. (Patient not taking: Reported on 01/13/2021), Disp: , Rfl:    lisinopril (ZESTRIL) 20 MG tablet, Take 20 mg by mouth daily. For BP. (Patient not taking: Reported on 01/13/2021), Disp: , Rfl:   NM PET Image Initial (PI) Skull Base To Thigh (F-18 FDG)  Result Date: 01/04/2021 CLINICAL DATA:  Initial treatment strategy for biopsy demonstrating adenocarcinoma in left lobe of liver. Dementia. EXAM: NUCLEAR MEDICINE PET SKULL BASE TO THIGH TECHNIQUE: 7.5 mCi F-18 FDG was injected intravenously. Full-ring PET imaging was performed from the skull base to thigh after the radiotracer. CT data was obtained and used for attenuation correction and anatomic localization. Fasting blood glucose: 84 mg/dl COMPARISON:  Abdominopelvic CT of 11/24/2020.  CTA chest 05/24/2019 FINDINGS: Mediastinal blood pool activity: SUV max 2.3 Liver activity: SUV max NA NECK: No areas of abnormal hypermetabolism. Incidental CT findings: No cervical adenopathy. CHEST: No pulmonary parenchymal or thoracic nodal hypermetabolism. Incidental CT findings: Aortic atherosclerosis. Mild cardiomegaly. No thoracic adenopathy. ABDOMEN/PELVIS: Dominant left hepatic lobe mass measures on the order of 14.6 x 10.2 cm and a S.U.V. max of 11.4 on 113/3. Porta hepatis nodes detailed on the prior CT are hypermetabolic. Example at 2.2 cm and a S.U.V. max of 4.7 on 125/3. Adjacent to this, a gastrohepatic ligament node measures 1.3 cm and a S.U.V. max of 4.7 on 126/3. Sigmoid hypermetabolism without well-defined mass, favored to be physiologic. Incidental CT findings: Deferred to recent diagnostic CT.  Normal adrenal glands. Low-density bilateral renal lesions are likely cysts. Abdominal aortic atherosclerosis. Cholecystectomy. Extensive colonic diverticulosis. Hysterectomy. Pelvic floor laxity. SKELETON: No abnormal marrow activity. Incidental CT findings: Osteopenia. IMPRESSION: 1. Marked hypermetabolism corresponds to the dominant left hepatic lobe adenocarcinoma. 2. Porta hepatis hypermetabolic nodes, consistent with metastatic disease. 3. No extrahepatic hypermetabolic primary identified. Electronically Signed   By: Abigail Miyamoto M.D.   On: 01/04/2021 15:45  No images are attached to the encounter.   CMP Latest Ref Rng & Units 12/24/2020  Glucose 70 - 99 mg/dL 96  BUN 8 - 23 mg/dL 15  Creatinine 0.44 - 1.00 mg/dL 0.86  Sodium 135 - 145 mmol/L 136  Potassium 3.5 - 5.1 mmol/L 3.5  Chloride 98 - 111 mmol/L 98  CO2 22 - 32 mmol/L 25  Calcium 8.9 - 10.3 mg/dL 9.4  Total Protein 6.5 - 8.1 g/dL 8.1  Total Bilirubin 0.3 - 1.2 mg/dL 0.9  Alkaline Phos 38 - 126 U/L 201(H)  AST 15 - 41 U/L 31  ALT 0 - 44 U/L 23   CBC Latest Ref Rng & Units 12/24/2020  WBC 4.0 - 10.5 K/uL 12.8(H)  Hemoglobin 12.0 - 15.0 g/dL 10.1(L)  Hematocrit 36.0 - 46.0 % 31.7(L)  Platelets 150 - 400 K/uL 229     Observation/objective: Appears in no acute distress over video visit today breathing is nonlabored  Assessment and plan: Patient is a 80 year old female with 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  This is a visit to discuss PET CT scan results and further management  I have reviewed PET/CT scan images independently and discussed findings with the patient and her family which confirms a large 14 cm primaryLiver mass with adjacent porta hepatis and gastrohepatic ligament adenopathy which is consistent with regional lymph node metastases.  Liver biopsy had confirmed adenocarcinoma that was compatible with pancreaticobiliary/intrahepatic cholangiocarcinoma.  Her AFP is normal and CA  19-9 is elevated at 1299.  No primary pancreatic mass seen on MRI or PET CT scan.  This will all therefore be consistent with intrahepatic cholangiocarcinoma.  Patient is elderly and frail and given the size of the liver mass, I do not think that this is resectable and I do not think that she is a surgical candidate either.  At this time I would recommend starting off with systemic chemotherapy.  Ideally she would need gemcitabine cisplatin combination treatment.  However she is frail and it is unclear how well she will tolerate chemotherapy and therefore I will start off with single agent gemcitabine alone.  If she tolerates it well I will consider adding cisplatin chemotherapy with cycle 2.  I plan to give her gemcitabine at 800 mg per metered squared IV 2 weeks on and 1 week off with possible growth factor support along with cisplatin to be added with cycle 2 for cycle 3 at split dose of 35 mg per metered square on day 1 and day 8.  Discussed risks and benefits of chemotherapy including all but not limited to nausea, vomiting, low blood counts, risk of infections and hospitalizations.  Risk of hearing loss peripheral neuropathy associated with cisplatin.  Treatment will be given with a palliative intent.  If after 3 months of chemotherapy she has an excellent response on CT scans we could consider chemoradiation for her down the line.  She ideally needs treatment until progression or toxicity but based on tolerance we could consider giving her a break from systemic chemotherapy if she also has good response on CT scans.  Initially patient was contemplating taking her to [place] but as of now they have decided against it and patient and family would like to receive her care at [facility].  We will plan for port placement at this time.  She will also need chemo teach.  Tentatively start chemotherapy in 1 to 2 weeks time  We will also send off NGS testing on her tumor  specimen which did showFGF R2 mutation making  her a candidate for FGFR2 inhibitors and second line or if she is intolerant to chemotherapy first-line  Patient's family had multiple insightful questions and all of them were answered to their satisfaction  Cancer Staging Primary cholangiocarcinoma of intrahepatic bile duct ([place]) Staging form: Intrahepatic Bile Duct, AJCC 8th Edition - Clinical stage from 01/16/2021: Stage IIIB (cT1b, cN1, cM0) - Signed by Sindy Guadeloupe, MD on 01/16/2021   Follow-up instructions: As above  I discussed the assessment and treatment plan with the patient. The patient was provided an opportunity to ask questions and all were answered. The patient agreed with the plan and demonstrated an understanding of the instructions.   The patient was advised to call back or seek an in-person evaluation if the symptoms worsen or if the condition fails to improve as anticipated. Visit Diagnosis: 1. Adenocarcinoma determined by biopsy of liver ([place])   2. MDS (myelodysplastic syndrome) ([place])   3. Other abnormal tumor markers     Dr. Randa Evens, MD, MPH [place] at [facility] Tel- [phone] 01/16/2021 7:29 AM

## 2021-01-16 NOTE — Addendum Note (Signed)
Addended by: Sindy Guadeloupe on: 01/16/2021 07:48 AM   Modules accepted: Orders

## 2021-01-16 NOTE — Progress Notes (Signed)
START OFF PATHWAY REGIMEN - Other   [id number]:Cisplatin 25 mg/m2 D1,8 + Gemcitabine 1,000 mg/m2 D1,8 q21 Days:   A cycle is every 21 days:     Gemcitabine      Cisplatin   **Always confirm dose/schedule in your pharmacy ordering system**  Patient Characteristics: Intent of Therapy: Non-Curative / Palliative Intent, Discussed with Patient 

## 2021-01-17 ENCOUNTER — Encounter
Admission: RE | Disposition: A | Discharge status: Home / Self Care | Payer: Self-pay | Source: Home / Self Care | Attending: Vascular Surgery

## 2021-01-17 ENCOUNTER — Other Ambulatory Visit (INDEPENDENT_AMBULATORY_CARE_PROVIDER_SITE_OTHER): Payer: Self-pay | Admitting: Nurse Practitioner

## 2021-01-17 ENCOUNTER — Ambulatory Visit
Admission: RE | Admit: 2021-01-17 | Discharge: 2021-01-17 | Disposition: A | Discharge status: Home / Self Care | Payer: Medicare Other | Attending: Vascular Surgery | Admitting: Vascular Surgery

## 2021-01-17 ENCOUNTER — Encounter: Payer: Self-pay | Admitting: Vascular Surgery

## 2021-01-17 DIAGNOSIS — C229 Malignant neoplasm of liver, not specified as primary or secondary: Secondary | ICD-10-CM | POA: Insufficient documentation

## 2021-01-17 DIAGNOSIS — R978 Other abnormal tumor markers: Secondary | ICD-10-CM | POA: Diagnosis not present

## 2021-01-17 DIAGNOSIS — D469 Myelodysplastic syndrome, unspecified: Secondary | ICD-10-CM | POA: Insufficient documentation

## 2021-01-17 DIAGNOSIS — I10 Essential (primary) hypertension: Secondary | ICD-10-CM | POA: Diagnosis not present

## 2021-01-17 HISTORY — PX: PORTA CATH INSERTION: CATH118285

## 2021-01-17 SURGERY — PORTA CATH INSERTION
Anesthesia: Moderate Sedation

## 2021-01-17 MED ORDER — MIDAZOLAM HCL 2 MG/ML PO SYRP: 8.0000 mg | ORAL_SOLUTION | Freq: Once | ORAL | Status: AC | PRN

## 2021-01-17 MED ORDER — FENTANYL CITRATE PF 50 MCG/ML IJ SOSY
PREFILLED_SYRINGE | INTRAMUSCULAR | Status: AC
  Filled 2021-01-17: qty 1

## 2021-01-17 MED ORDER — HYDROMORPHONE HCL 1 MG/ML IJ SOLN: 1.0000 mg | Freq: Once | INTRAMUSCULAR | Status: DC | PRN

## 2021-01-17 MED ORDER — SODIUM CHLORIDE 0.9 % IV SOLN
Freq: Once | INTRAVENOUS | Status: AC
  Filled 2021-01-17: qty 2

## 2021-01-17 MED ORDER — METHYLPREDNISOLONE SODIUM SUCC 125 MG IJ SOLR: 125.0000 mg | Freq: Once | INTRAMUSCULAR | Status: DC | PRN

## 2021-01-17 MED ORDER — CEFAZOLIN SODIUM-DEXTROSE 2-4 GM/100ML-% IV SOLN: 2.0000 g | Freq: Once | INTRAVENOUS | Status: AC

## 2021-01-17 MED ORDER — ONDANSETRON HCL 4 MG/2ML IJ SOLN: 4.0000 mg | Freq: Four times a day (QID) | INTRAMUSCULAR | Status: DC | PRN

## 2021-01-17 MED ORDER — FENTANYL CITRATE (PF) 100 MCG/2ML IJ SOLN
INTRAMUSCULAR | Status: DC | PRN
  Administered 2021-01-17 (×2): 25 ug via INTRAVENOUS

## 2021-01-17 MED ORDER — CHLORHEXIDINE GLUCONATE CLOTH 2 % EX PADS
6.0000 | MEDICATED_PAD | Freq: Every day | CUTANEOUS | Status: DC
  Administered 2021-01-17: 6 via TOPICAL

## 2021-01-17 MED ORDER — DIPHENHYDRAMINE HCL 50 MG/ML IJ SOLN: 50.0000 mg | Freq: Once | INTRAMUSCULAR | Status: DC | PRN

## 2021-01-17 MED ORDER — MIDAZOLAM HCL 2 MG/2ML IJ SOLN
INTRAMUSCULAR | Status: DC | PRN
  Administered 2021-01-17 (×2): 1 mg via INTRAVENOUS

## 2021-01-17 MED ORDER — FAMOTIDINE 20 MG PO TABS: 40.0000 mg | ORAL_TABLET | Freq: Once | ORAL | Status: DC | PRN

## 2021-01-17 MED ORDER — CEFAZOLIN SODIUM-DEXTROSE 2-4 GM/100ML-% IV SOLN
INTRAVENOUS | Status: AC
  Administered 2021-01-17: 2 g via INTRAVENOUS
  Filled 2021-01-17: qty 100

## 2021-01-17 MED ORDER — MIDAZOLAM HCL 2 MG/ML PO SYRP
ORAL_SOLUTION | ORAL | Status: AC
  Administered 2021-01-17: 4 mg via ORAL
  Filled 2021-01-17: qty 4

## 2021-01-17 MED ORDER — MIDAZOLAM HCL 2 MG/2ML IJ SOLN
INTRAMUSCULAR | Status: AC
  Filled 2021-01-17: qty 2

## 2021-01-17 MED ORDER — SODIUM CHLORIDE 0.9 % IV SOLN: INTRAVENOUS | Status: DC

## 2021-01-17 SURGICAL SUPPLY — 14 items
ADH SKN CLS APL DERMABOND .7 (GAUZE/BANDAGES/DRESSINGS) ×1
COVER PROBE U/S 5X48 (MISCELLANEOUS) ×1 IMPLANT
COVER SURGICAL LIGHT HANDLE (MISCELLANEOUS) ×1 IMPLANT
DERMABOND ADVANCED (GAUZE/BANDAGES/DRESSINGS) ×1
DERMABOND ADVANCED .7 DNX12 (GAUZE/BANDAGES/DRESSINGS) IMPLANT
HANDLE YANKAUER SUCT BULB TIP (MISCELLANEOUS) ×1 IMPLANT
KIT PORT POWER 8FR ISP CVUE (Port) ×1 IMPLANT
PACK ANGIOGRAPHY (CUSTOM PROCEDURE TRAY) ×2 IMPLANT
PENCIL ELECTRO HAND CTR (MISCELLANEOUS) ×1 IMPLANT
SPONGE XRAY 4X4 16PLY STRL (MISCELLANEOUS) ×1 IMPLANT
SUT MNCRL AB 4-0 PS2 18 (SUTURE) ×1 IMPLANT
SUT VIC AB 3-0 SH 27 (SUTURE) ×2
SUT VIC AB 3-0 SH 27X BRD (SUTURE) IMPLANT
TUBING CONNECTING 10 (TUBING) ×1 IMPLANT

## 2021-01-17 NOTE — Op Note (Signed)
      [place] [facility]       Operative Note  Date: 01/17/2021  Preoperative diagnosis:  1. Liver cancer  Postoperative diagnosis:  Same as above  Procedures: #1. Ultrasound guidance for vascular access to the right internal jugular vein. #2. Fluoroscopic guidance for placement of catheter. #3. Placement of CT compatible Port-A-Cath, right internal jugular vein.  Surgeon: Leotis Pain, MD.   Anesthesia: Local with moderate conscious sedation for approximately 27  minutes using 2 mg of Versed and 50 mcg of Fentanyl  Fluoroscopy time: less than 1 minute  Contrast used: 0  Estimated blood loss: 5 cc  Indication for the procedure:  The patient is a 80 y.o.female with liver cancer.  The patient needs a Port-A-Cath for durable venous access, chemotherapy, lab draws, and CT scans. We are asked to place this. Risks and benefits were discussed and informed consent was obtained.  Description of procedure: The patient was brought to the vascular and interventional radiology suite.  Moderate conscious sedation was administered throughout the procedure during a face to face encounter with the patient with my supervision of the RN administering medicines and monitoring the patient's vital signs, pulse oximetry, telemetry and mental status throughout from the start of the procedure until the patient was taken to the recovery room. The right neck chest and shoulder were sterilely prepped and draped, and a sterile surgical field was created. Ultrasound was used to help visualize a patent right internal jugular vein. This was then accessed under direct ultrasound guidance without difficulty with the Seldinger needle and a permanent image was recorded. A J-wire was placed. After skin nick and dilatation, the peel-away sheath was then placed over the wire. I then anesthetized an area under the clavicle approximately 1-2 fingerbreadths. A transverse incision was created and an inferior pocket was  created with electrocautery and blunt dissection. The port was then brought onto the field, placed into the pocket and secured to the chest wall with 2 Prolene sutures. The catheter was connected to the port and tunneled from the subclavicular incision to the access site. Fluoroscopic guidance was then used to cut the catheter to an appropriate length. The catheter was then placed through the peel-away sheath and the peel-away sheath was removed. The catheter tip was parked in excellent location under fluorocoscopic guidance in the cavoatrial junction. The pocket was then irrigated with antibiotic impregnated saline and the wound was closed with a running 3-0 Vicryl and a 4-0 Monocryl. The access incision was closed with a single 4-0 Monocryl. The Huber needle was used to withdraw blood and flush the port with heparinized saline. Dermabond was then placed as a dressing. The patient tolerated the procedure well and was taken to the recovery room in stable condition.   Leotis Pain 01/17/2021 1:23 PM   This note was created with Dragon Medical transcription system. Any errors in dictation are purely unintentional.

## 2021-01-17 NOTE — Interval H&P Note (Signed)
History and Physical Interval Note:  01/17/2021 11:01 AM  Sylvia Miller  has presented today for surgery, with the diagnosis of Porta Cath Placement   Adenocarinoma liver.  The various methods of treatment have been discussed with the patient and family. After consideration of risks, benefits and other options for treatment, the patient has consented to  Procedure(s): PORTA CATH INSERTION (N/A) as a surgical intervention.  The patient's history has been reviewed, patient examined, no change in status, stable for surgery.  I have reviewed the patient's chart and labs.  Questions were answered to the patient's satisfaction.     Leotis Pain

## 2021-01-18 ENCOUNTER — Inpatient Hospital Stay: Payer: Medicare Other

## 2021-01-18 ENCOUNTER — Telehealth: Payer: Self-pay

## 2021-01-18 ENCOUNTER — Ambulatory Visit: Admit: 2021-01-18 | Payer: Medicare Other

## 2021-01-18 ENCOUNTER — Other Ambulatory Visit: Payer: Self-pay

## 2021-01-18 ENCOUNTER — Telehealth: Payer: Self-pay | Admitting: *Deleted

## 2021-01-18 ENCOUNTER — Inpatient Hospital Stay (HOSPITAL_BASED_OUTPATIENT_CLINIC_OR_DEPARTMENT_OTHER): Payer: Medicare Other | Admitting: Hospice and Palliative Medicine

## 2021-01-18 VITALS — BP 182/71 | HR 73 | Temp 98.2°F | Resp 18

## 2021-01-18 DIAGNOSIS — Z79899 Other long term (current) drug therapy: Secondary | ICD-10-CM | POA: Diagnosis not present

## 2021-01-18 DIAGNOSIS — Z7952 Long term (current) use of systemic steroids: Secondary | ICD-10-CM | POA: Diagnosis not present

## 2021-01-18 DIAGNOSIS — R059 Cough, unspecified: Secondary | ICD-10-CM | POA: Diagnosis not present

## 2021-01-18 DIAGNOSIS — C221 Intrahepatic bile duct carcinoma: Secondary | ICD-10-CM | POA: Diagnosis not present

## 2021-01-18 DIAGNOSIS — J069 Acute upper respiratory infection, unspecified: Secondary | ICD-10-CM | POA: Diagnosis not present

## 2021-01-18 DIAGNOSIS — Z7982 Long term (current) use of aspirin: Secondary | ICD-10-CM | POA: Diagnosis not present

## 2021-01-18 DIAGNOSIS — R0989 Other specified symptoms and signs involving the circulatory and respiratory systems: Secondary | ICD-10-CM | POA: Diagnosis not present

## 2021-01-18 DIAGNOSIS — R978 Other abnormal tumor markers: Secondary | ICD-10-CM | POA: Diagnosis not present

## 2021-01-18 DIAGNOSIS — D469 Myelodysplastic syndrome, unspecified: Secondary | ICD-10-CM | POA: Diagnosis not present

## 2021-01-18 SURGERY — ESOPHAGOGASTRODUODENOSCOPY (EGD) WITH PROPOFOL
Anesthesia: General

## 2021-01-18 MED ORDER — ONDANSETRON HCL 8 MG PO TABS: 8.0000 mg | ORAL_TABLET | Freq: Two times a day (BID) | ORAL | 1 refills | Status: AC | PRN

## 2021-01-18 NOTE — Progress Notes (Signed)
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  Plan is to start systemic chemotherapy with single agent gemcitabine, given concern for possible intolerance of a more aggressive chemotherapy regimen.  Patient was scheduled for chemo education on 01/18/2021 and was sent to [facility] by RN for evaluation of cough/congestion.  Last night, patient says that she developed a runny nose and nonproductive cough.  She feels mucus and postnasal drip.  She denies shortness of breath.  She did have an episode of nausea and vomiting yesterday but none today.  No fever or chills.  No myalgias.  No sore throat.  Patient is fully vaccinated for COVID.  She has not yet had her influenza vaccine.  No known sick contacts.  Denies any neurologic complaints. Denies any easy bleeding or bruising. Reports good appetite and denies weight loss. Denies chest pain. Denies any constipation, or diarrhea. Denies urinary complaints. Patient offers no further specific complaints today.  PAST MEDICAL HISTORY: Past Medical History:  Diagnosis Date   Allergy    Anemia    Arthritis    rheumatoid /knees/hands/shoulders (Right shoulder) infusion every 5 weeks   Bladder incontinence    Chicken pox    Dyspnea    with exertion or far walking   Gastric ulcer    GERD (gastroesophageal reflux disease)    Heart murmur    Hypercholesteremia    Hyperlipidemia, unspecified    Hypertension    controlled with  meds;    MDS (myelodysplastic syndrome) ([place]) 2000   Memory deficit    Myelodysplastic syndrome ([place])    Rheumatoid arthritis ([place])    Ulcer     PAST SURGICAL HISTORY:  Past Surgical History:  Procedure Laterality Date   ABDOMINAL HYSTERECTOMY     APPENDECTOMY     as a child   CHOLECYSTECTOMY     COLONOSCOPY     COLONOSCOPY WITH PROPOFOL N/A 04/24/2017   Procedure: COLONOSCOPY WITH PROPOFOL;  Surgeon: Lollie Sails, MD;  Location: [facility] ENDOSCOPY;  Service: Endoscopy;  Laterality: N/A;   ESOPHAGOGASTRODUODENOSCOPY (EGD) WITH PROPOFOL N/A 04/24/2017   Procedure: ESOPHAGOGASTRODUODENOSCOPY (EGD) WITH PROPOFOL;  Surgeon: Lollie Sails, MD;  Location: [facility] ENDOSCOPY;  Service: Endoscopy;  Laterality: N/A;   JOINT REPLACEMENT     KNEE ARTHROPLASTY Right 02/21/2017   Procedure: COMPUTER ASSISTED TOTAL KNEE ARTHROPLASTY;  Surgeon: Dereck Leep, MD;  Location: [facility] ORS;  Service: Orthopedics;  Laterality: Right;   KNEE ARTHROPLASTY Left 08/06/2017   Procedure: COMPUTER ASSISTED TOTAL KNEE ARTHROPLASTY;  Surgeon: Dereck Leep, MD;  Location: [facility] ORS;  Service: Orthopedics;  Laterality: Left;   NM INTRAVENOUS INJECTION ([facility] HX)     receives biologic infusions every 5 weeks for rheumatoid arthritis. followed by dr. Meda Coffee at [place] N/A 01/17/2021   Procedure: PORTA CATH INSERTION;  Surgeon: Algernon Huxley, MD;  Location: [facility];  Service: Cardiovascular;  Laterality: N/A;   TONSILLECTOMY     UPPER GASTROINTESTINAL ENDOSCOPY     WISDOM  TOOTH EXTRACTION      HEMATOLOGY/ONCOLOGY HISTORY:  Oncology History  Primary cholangiocarcinoma of intrahepatic bile duct ([place])  01/16/2021 Initial Diagnosis   Primary cholangiocarcinoma of intrahepatic bile duct ([place])   01/16/2021 Cancer Staging   Staging form: Intrahepatic Bile Duct, AJCC 8th Edition - Clinical stage from 01/16/2021: Stage IIIB (cT1b, cN1, cM0) - Signed by Sindy Guadeloupe, MD on  01/16/2021    01/17/2021 -  Chemotherapy   Patient is on Treatment Plan : BILIARY TRACT Cisplatin + Gemcitabine D1,8 q21d       ALLERGIES:  is allergic to pollen extract.  MEDICATIONS:  Current Outpatient Medications  Medication Sig Dispense Refill   acetaminophen (TYLENOL) 325 MG tablet Take 650 mg by mouth every 6 (six) hours as needed for moderate pain or headache.      Artificial Tear Solution (GENTEAL TEARS) 0.1-0.2-0.3 % SOLN Place 1 drop into both eyes daily as needed (dry eye).      aspirin EC 81 MG tablet Take 81 mg by mouth daily.     atorvastatin (LIPITOR) 10 MG tablet Take 5 mg by mouth daily. In the morning (Patient not taking: Reported on 01/13/2021)     dexamethasone (DECADRON) 4 MG tablet Take 2 tablets (8 mg total) by mouth daily. Take daily x 3 days starting the day after cisplatin chemotherapy. Take with food. 30 tablet 1   diclofenac sodium (VOLTAREN) 1 % GEL Apply 2 g topically 4 (four) times daily as needed (pain). Apply to painful joint     donepezil (ARICEPT) 10 MG tablet Take 10 mg by mouth at bedtime. (Patient not taking: No sig reported)     ferrous sulfate 325 (65 FE) MG tablet Take 325 mg by mouth daily with breakfast.     fluticasone (FLONASE) 50 MCG/ACT nasal spray Place into both nostrils daily. (Patient not taking: Reported on 01/13/2021)     hydrochlorothiazide (HYDRODIURIL) 12.5 MG tablet Take 12.5 mg by mouth daily.     lidocaine-prilocaine (EMLA) cream Apply to affected area once 30 g 3   lisinopril (ZESTRIL) 20 MG tablet Take 20 mg by mouth daily. For BP. (Patient not taking: Reported on 01/13/2021)     LORazepam (ATIVAN) 0.5 MG tablet Take 0.5 mg by mouth once.     meloxicam (MOBIC) 7.5 MG tablet Take 7.5 mg by mouth daily. For muscle pains.     memantine (NAMENDA) 10 MG tablet Take 10 mg by mouth 2 (two) times daily.     Menthol, Topical Analgesic, (BIOFREEZE EX) Apply 1 application topically as needed (for knee/leg pain).     mirtazapine (REMERON)  7.5 MG tablet Take 7.5 mg by mouth at bedtime.     Multiple Vitamins-Minerals (OCUVITE PO) Take 0.5 tablets by mouth in the morning and at bedtime.     ondansetron (ZOFRAN) 8 MG tablet Take 1 tablet (8 mg total) by mouth 2 (two) times daily as needed. Start on the third day after cisplatin chemotherapy. 30 tablet 1   oxybutynin (DITROPAN-XL) 10 MG 24 hr tablet Take 10 mg by mouth at bedtime. Urinary Frequency.     oxyCODONE (OXY IR/ROXICODONE) 5 MG immediate release tablet Take 1 tablet (5 mg total) by mouth every 6 (six) hours as needed for severe pain. 60 tablet 0   pantoprazole (PROTONIX) 40 MG tablet Take 1 tablet (40 mg total) by mouth daily. 60 tablet 3   prochlorperazine (COMPAZINE) 10 MG tablet Take 1 tablet (10 mg total) by mouth every 6 (six) hours as  needed (Nausea or vomiting). 30 tablet 1   sulfaSALAzine (AZULFIDINE) 500 MG tablet Take 500 mg by mouth 2 (two) times daily. Anti-Inflammatory for 90 days.     triamcinolone cream (KENALOG) 0.5 % Apply topically.     No current facility-administered medications for this visit.    VITAL SIGNS: There were no vitals taken for this visit. There were no vitals filed for this visit.  Estimated body mass index is 29.86 kg/m as calculated from the following:   Height as of 01/17/21: 4\' 9"  (1.448 m).   Weight as of 01/17/21: 138 lb (62.6 kg).  LABS: CBC:    Component Value Date/Time   WBC 12.8 (H) 12/24/2020 1148   HGB 10.1 (L) 12/24/2020 1148   HCT 31.7 (L) 12/24/2020 1148   PLT 229 12/24/2020 1148   MCV 99.4 12/24/2020 1148   NEUTROABS 8.7 (H) 12/24/2020 1148   LYMPHSABS 2.6 12/24/2020 1148   MONOABS 1.3 (H) 12/24/2020 1148   EOSABS 0.1 12/24/2020 1148   BASOSABS 0.1 12/24/2020 1148   Comprehensive Metabolic Panel:    Component Value Date/Time   NA 136 12/24/2020 1148   K 3.5 12/24/2020 1148   CL 98 12/24/2020 1148   CO2 25 12/24/2020 1148   BUN 15 12/24/2020 1148   CREATININE 0.86 12/24/2020 1148   GLUCOSE 96 12/24/2020  1148   CALCIUM 9.4 12/24/2020 1148   AST 31 12/24/2020 1148   ALT 23 12/24/2020 1148   ALKPHOS 201 (H) 12/24/2020 1148   BILITOT 0.9 12/24/2020 1148   PROT 8.1 12/24/2020 1148   ALBUMIN 3.9 12/24/2020 1148    RADIOGRAPHIC STUDIES: PERIPHERAL VASCULAR CATHETERIZATION  Result Date: 01/17/2021 See surgical note for result.  NM PET Image Initial (PI) Skull Base To Thigh (F-18 FDG)  Result Date: 01/04/2021 CLINICAL DATA:  Initial treatment strategy for biopsy demonstrating adenocarcinoma in left lobe of liver. Dementia. EXAM: NUCLEAR MEDICINE PET SKULL BASE TO THIGH TECHNIQUE: 7.5 mCi F-18 FDG was injected intravenously. Full-ring PET imaging was performed from the skull base to thigh after the radiotracer. CT data was obtained and used for attenuation correction and anatomic localization. Fasting blood glucose: 84 mg/dl COMPARISON:  Abdominopelvic CT of 11/24/2020.  CTA chest 05/24/2019 FINDINGS: Mediastinal blood pool activity: SUV max 2.3 Liver activity: SUV max NA NECK: No areas of abnormal hypermetabolism. Incidental CT findings: No cervical adenopathy. CHEST: No pulmonary parenchymal or thoracic nodal hypermetabolism. Incidental CT findings: Aortic atherosclerosis. Mild cardiomegaly. No thoracic adenopathy. ABDOMEN/PELVIS: Dominant left hepatic lobe mass measures on the order of 14.6 x 10.2 cm and a S.U.V. max of 11.4 on 113/3. Porta hepatis nodes detailed on the prior CT are hypermetabolic. Example at 2.2 cm and a S.U.V. max of 4.7 on 125/3. Adjacent to this, a gastrohepatic ligament node measures 1.3 cm and a S.U.V. max of 4.7 on 126/3. Sigmoid hypermetabolism without well-defined mass, favored to be physiologic. Incidental CT findings: Deferred to recent diagnostic CT. Normal adrenal glands. Low-density bilateral renal lesions are likely cysts. Abdominal aortic atherosclerosis. Cholecystectomy. Extensive colonic diverticulosis. Hysterectomy. Pelvic floor laxity. SKELETON: No abnormal marrow  activity. Incidental CT findings: Osteopenia. IMPRESSION: 1. Marked hypermetabolism corresponds to the dominant left hepatic lobe adenocarcinoma. 2. Porta hepatis hypermetabolic nodes, consistent with metastatic disease. 3. No extrahepatic hypermetabolic primary identified. Electronically Signed   By: Abigail Miyamoto M.D.   On: 01/04/2021 15:45    PERFORMANCE STATUS (ECOG) : 2 - Symptomatic, <50% confined to bed  Review of Systems Unless otherwise noted, a complete review of  systems is negative.  Physical Exam General: NAD HEENT: Ears clear, OP without significant erythema or exudate Cardiovascular: regular rate and rhythm Pulmonary: clear ant fields Abdomen: soft, nontender, + bowel sounds GU: no suprapubic tenderness Extremities: no edema, no joint deformities Skin: no rashes Neurological: Weakness but otherwise nonfocal  Assessment and Plan- Patient is a 80 y.o. female with newly diagnosed stage IIIb cholangiocarcinoma who presents to [facility] for evaluation of cough and nasal congestion   URI -recommended that patient obtain COVID/influenza testing.  She is still within the window for effective treatment.  Symptoms are consistent with viral syndrome.  Discussed symptomatic care including increased fluids, rest, Mucinex, and OTC cough suppressant.  Would avoid nasal decongestants given hypertension.  We will need to reschedule chemo education.    Case and plan discussed with Dr. [name]   Patient expressed understanding and was in agreement with this plan. She also understands that She can call clinic at any time with any questions, concerns, or complaints.   Thank you for allowing me to participate in the care of this very pleasant patient.   Time Total: 15 minutes  Visit consisted of counseling and education dealing with the complex and emotionally intense issues of symptom management in the setting of serious illness.Greater than 50%  of this time was spent counseling and coordinating care  related to the above assessment and plan.  Signed by: Altha Harm, PhD, NP-C

## 2021-01-18 NOTE — Progress Notes (Signed)
Pt in chemo class this morning and received request by RN for pt to be seen in [facility]. Pt/husband reports productive cough since last night. Endorses upper abdominal pain and some nausea.

## 2021-01-18 NOTE — Telephone Encounter (Signed)
[name] called reporting that the COVID and Flu tests are both negative

## 2021-01-18 NOTE — Telephone Encounter (Signed)
Received message to call daughter [name] about New Patient Education class.   Call made but no answer and mail box was full so could not leave message.

## 2021-01-19 ENCOUNTER — Ambulatory Visit: Payer: Medicare Other | Admitting: Oncology

## 2021-01-24 ENCOUNTER — Inpatient Hospital Stay: Payer: Medicare Other

## 2021-01-24 ENCOUNTER — Other Ambulatory Visit: Payer: Medicare Other

## 2021-01-24 ENCOUNTER — Other Ambulatory Visit: Payer: Self-pay

## 2021-01-25 ENCOUNTER — Telehealth: Payer: Self-pay | Admitting: *Deleted

## 2021-01-25 ENCOUNTER — Other Ambulatory Visit: Payer: Self-pay | Admitting: Nurse Practitioner

## 2021-01-25 MED ORDER — LOPERAMIDE HCL 2 MG PO CAPS: 2.0000 mg | ORAL_CAPSULE | ORAL | 1 refills | Status: AC | PRN

## 2021-01-25 MED ORDER — PANTOPRAZOLE SODIUM 40 MG PO TBEC: 40.0000 mg | DELAYED_RELEASE_TABLET | Freq: Every day | ORAL | 3 refills | Status: AC

## 2021-01-25 MED ORDER — OXYCODONE HCL 5 MG PO TABS
5.0000 mg | ORAL_TABLET | Freq: Four times a day (QID) | ORAL | 0 refills | Status: DC | PRN
Start: 2021-01-25 — End: 2021-04-19

## 2021-01-25 NOTE — Telephone Encounter (Signed)
One of you please call her today

## 2021-01-25 NOTE — Telephone Encounter (Signed)
Daughter [name] called reporting that patient is having upper abdominal pain (nonspecific when asking patient) and has no medicine except Tylenol on hand and it is not controlling her pain. She had been given medicine, but it ran out (Oxycodone). She also reports that she feels she is going to vomit. She is also having diarrhea for several days, less than 3 times a day. She is not eating much at all. She is asking for prescription for patient or what they need to do. She is to start Chemotherapy Monday. Please advise

## 2021-01-25 NOTE — Progress Notes (Signed)
Spoke to patient and her family by phone. Patient is having increased belching, nausea, diarrhea, and pain. Symptomatic of known malignancy. Refilled oxycodone. Encouraged daily protonix and provided refill. Reviewed use of prescribed antiemetics. Imodium for diarrhea. ER precautions provided for worsening symptoms.

## 2021-01-31 ENCOUNTER — Encounter: Payer: Self-pay | Admitting: Oncology

## 2021-01-31 ENCOUNTER — Ambulatory Visit: Payer: Medicare Other

## 2021-01-31 ENCOUNTER — Inpatient Hospital Stay (HOSPITAL_BASED_OUTPATIENT_CLINIC_OR_DEPARTMENT_OTHER): Payer: Medicare Other | Admitting: Oncology

## 2021-01-31 ENCOUNTER — Other Ambulatory Visit: Payer: Self-pay | Admitting: *Deleted

## 2021-01-31 ENCOUNTER — Encounter: Payer: Medicare Other | Admitting: Hospice and Palliative Medicine

## 2021-01-31 ENCOUNTER — Ambulatory Visit: Payer: Medicare Other | Admitting: Oncology

## 2021-01-31 ENCOUNTER — Other Ambulatory Visit: Payer: Medicare Other

## 2021-01-31 ENCOUNTER — Inpatient Hospital Stay: Payer: Medicare Other

## 2021-01-31 ENCOUNTER — Inpatient Hospital Stay: Payer: Medicare Other | Attending: Oncology

## 2021-01-31 ENCOUNTER — Other Ambulatory Visit: Payer: Self-pay

## 2021-01-31 ENCOUNTER — Inpatient Hospital Stay (HOSPITAL_BASED_OUTPATIENT_CLINIC_OR_DEPARTMENT_OTHER): Payer: Medicare Other | Admitting: Hospice and Palliative Medicine

## 2021-01-31 VITALS — BP 166/73 | HR 94 | Temp 98.2°F | Resp 18 | Wt 134.3 lb

## 2021-01-31 DIAGNOSIS — Z515 Encounter for palliative care: Secondary | ICD-10-CM

## 2021-01-31 DIAGNOSIS — Z5111 Encounter for antineoplastic chemotherapy: Secondary | ICD-10-CM | POA: Diagnosis not present

## 2021-01-31 DIAGNOSIS — C221 Intrahepatic bile duct carcinoma: Secondary | ICD-10-CM

## 2021-01-31 DIAGNOSIS — E876 Hypokalemia: Secondary | ICD-10-CM | POA: Diagnosis not present

## 2021-01-31 LAB — CBC WITH DIFFERENTIAL/PLATELET
Abs Immature Granulocytes: 0.06 10*3/uL (ref 0.00–0.07)
Basophils Absolute: 0 10*3/uL (ref 0.0–0.1)
Basophils Relative: 0 %
Eosinophils Absolute: 0.1 10*3/uL (ref 0.0–0.5)
Eosinophils Relative: 0 %
HCT: 31.9 % — ABNORMAL LOW (ref 36.0–46.0)
Hemoglobin: 10.1 g/dL — ABNORMAL LOW (ref 12.0–15.0)
Immature Granulocytes: 1 %
Lymphocytes Relative: 14 %
Lymphs Abs: 1.5 10*3/uL (ref 0.7–4.0)
MCH: 32.3 pg (ref 26.0–34.0)
MCHC: 31.7 g/dL (ref 30.0–36.0)
MCV: 101.9 fL — ABNORMAL HIGH (ref 80.0–100.0)
Monocytes Absolute: 1.1 10*3/uL — ABNORMAL HIGH (ref 0.1–1.0)
Monocytes Relative: 9 %
Neutro Abs: 8.5 10*3/uL — ABNORMAL HIGH (ref 1.7–7.7)
Neutrophils Relative %: 76 %
Platelets: 197 10*3/uL (ref 150–400)
RBC: 3.13 MIL/uL — ABNORMAL LOW (ref 3.87–5.11)
RDW: 16.1 % — ABNORMAL HIGH (ref 11.5–15.5)
WBC: 11.2 10*3/uL — ABNORMAL HIGH (ref 4.0–10.5)
nRBC: 0 % (ref 0.0–0.2)

## 2021-01-31 LAB — COMPREHENSIVE METABOLIC PANEL
ALT: 13 U/L (ref 0–44)
AST: 26 U/L (ref 15–41)
Albumin: 3.5 g/dL (ref 3.5–5.0)
Alkaline Phosphatase: 124 U/L (ref 38–126)
Anion gap: 10 (ref 5–15)
BUN: 16 mg/dL (ref 8–23)
CO2: 26 mmol/L (ref 22–32)
Calcium: 8.3 mg/dL — ABNORMAL LOW (ref 8.9–10.3)
Chloride: 103 mmol/L (ref 98–111)
Creatinine, Ser: 0.83 mg/dL (ref 0.44–1.00)
GFR, Estimated: >60 mL/min (ref >60)
Glucose, Bld: 123 mg/dL — ABNORMAL HIGH (ref 70–99)
Potassium: 2.9 mmol/L — ABNORMAL LOW (ref 3.5–5.1)
Sodium: 139 mmol/L (ref 135–145)
Total Bilirubin: 0.3 mg/dL (ref 0.3–1.2)
Total Protein: 6.6 g/dL (ref 6.5–8.1)

## 2021-01-31 MED ORDER — POTASSIUM CHLORIDE CRYS ER 20 MEQ PO TBCR: 20.0000 meq | EXTENDED_RELEASE_TABLET | Freq: Two times a day (BID) | ORAL | 0 refills | Status: DC

## 2021-01-31 MED ORDER — SODIUM CHLORIDE 0.9 % IV SOLN
Freq: Once | INTRAVENOUS | Status: AC
  Filled 2021-01-31: qty 250

## 2021-01-31 MED ORDER — SODIUM CHLORIDE 0.9 % IV SOLN
10.0000 mg | Freq: Once | INTRAVENOUS | Status: AC
  Administered 2021-01-31: 11:00:00 10 mg via INTRAVENOUS
  Filled 2021-01-31: qty 10

## 2021-01-31 MED ORDER — SODIUM CHLORIDE 0.9 % IV SOLN
800.0000 mg/m2 | Freq: Once | INTRAVENOUS | Status: AC
  Administered 2021-01-31: 12:00:00 1254 mg via INTRAVENOUS
  Filled 2021-01-31: qty 26.3

## 2021-01-31 MED ORDER — POTASSIUM CHLORIDE 20 MEQ/100ML IV SOLN
20.0000 meq | Freq: Once | INTRAVENOUS | Status: AC
  Administered 2021-01-31: 12:00:00 20 meq via INTRAVENOUS

## 2021-01-31 MED ORDER — HEPARIN SOD (PORK) LOCK FLUSH 100 UNIT/ML IV SOLN
500.0000 [IU] | Freq: Once | INTRAVENOUS | Status: AC | PRN
  Administered 2021-01-31: 13:00:00 500 [IU]
  Filled 2021-01-31: qty 5

## 2021-01-31 NOTE — Progress Notes (Signed)
Hematology/Oncology Consult note [facility]  Telephone:([phone] [phone]  Patient Care Team: Dion Body, MD as PCP - General (Family Medicine) Clent Jacks, RN as Oncology Nurse Navigator   Name of the patient: Sylvia Miller  [id number]  06-07-1940   Date of visit: 01/31/21  Diagnosis-locally advanced cholangiocarcinoma  Chief complaint/ Reason for visit-on treatment assessment prior to cycle 1 day 1 of gemcitabine chemotherapy  Heme/Onc history: patient is a 80 year old female who was seen by [facility] forSymptoms of epigastric pain and weight loss.  She had a CT scan of the abdomen which showed 12.2 cm liver lesion as compared to 6.8 cm lesion in 2021 and 2.2 cm lesion in 2019.  This was followed by a repeat CT scan in September 2022 which showed further enlargement of the mass to 13.7 x 9.2 cm which favors primary pancreaticobiliary versus hepatic malignancy.  Moderate periportal adenopathy.  Patient had an ultrasound-guided biopsy of the level which was consistent with adenocarcinoma.  Cells positive for CK7 and CK19 and negative for CDX2 GATA3 PAX8 and TTF-1.  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  PET CT scan showed dominant left hepatic lobe mass measuring 14.6 x 10.2 cm with an SUV of 11.4.  Porta hepatis nodes 2.2 cm with an SUV of 4.7 and adjacent gastrohepatic ligament lymph node 1.3 cm with an SUV of 4.7.   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  Tumor mutational burden not high.  PD-L1 less than 1%.  Microsatellite stable    Interval history-reports that her abdominal pain is not as bad at this time.  She has some ongoing fatigue.  No new complaints at this time.  ECOG PS- 2 Pain scale- 0 Opioid associated constipation- no  Review of systems- Review of Systems  Constitutional:  Positive for  malaise/fatigue. Negative for chills, fever and weight loss.  HENT:  Negative for congestion, ear discharge and nosebleeds.   Eyes:  Negative for blurred vision.  Respiratory:  Negative for cough, hemoptysis, sputum production, shortness of breath and wheezing.   Cardiovascular:  Negative for chest pain, palpitations, orthopnea and claudication.  Gastrointestinal:  Negative for abdominal pain, blood in stool, constipation, diarrhea, heartburn, melena, nausea and vomiting.  Genitourinary:  Negative for dysuria, flank pain, frequency, hematuria and urgency.  Musculoskeletal:  Negative for back pain, joint pain and myalgias.  Skin:  Negative for rash.  Neurological:  Negative for dizziness, tingling, focal weakness, seizures, weakness and headaches.  Endo/Heme/Allergies:  Does not bruise/bleed easily.  Psychiatric/Behavioral:  Negative for depression and suicidal ideas. The patient does not have insomnia.       Allergies  Allergen Reactions   Pollen Extract Other (See Comments)    Nose runs, eyes itch, etc     Past Medical History:  Diagnosis Date   Allergy    Anemia    Arthritis    rheumatoid /knees/hands/shoulders (Right shoulder) infusion every 5 weeks   Bladder incontinence    Chicken pox    Dyspnea    with exertion or far walking   Gastric ulcer    GERD (gastroesophageal reflux disease)    Heart murmur    Hypercholesteremia    Hyperlipidemia, unspecified    Hypertension    controlled with meds;    MDS (myelodysplastic syndrome) ([place]) 2000   Memory deficit    Myelodysplastic syndrome (HCC)    Rheumatoid arthritis ([place])  Ulcer      Past Surgical History:  Procedure Laterality Date   ABDOMINAL HYSTERECTOMY     APPENDECTOMY     as a child   CHOLECYSTECTOMY     COLONOSCOPY     COLONOSCOPY WITH PROPOFOL N/A 04/24/2017   Procedure: COLONOSCOPY WITH PROPOFOL;  Surgeon: Lollie Sails, MD;  Location: [facility] ENDOSCOPY;  Service: Endoscopy;  Laterality: N/A;    ESOPHAGOGASTRODUODENOSCOPY (EGD) WITH PROPOFOL N/A 04/24/2017   Procedure: ESOPHAGOGASTRODUODENOSCOPY (EGD) WITH PROPOFOL;  Surgeon: Lollie Sails, MD;  Location: [facility] ENDOSCOPY;  Service: Endoscopy;  Laterality: N/A;   JOINT REPLACEMENT     KNEE ARTHROPLASTY Right 02/21/2017   Procedure: COMPUTER ASSISTED TOTAL KNEE ARTHROPLASTY;  Surgeon: Dereck Leep, MD;  Location: [facility] ORS;  Service: Orthopedics;  Laterality: Right;   KNEE ARTHROPLASTY Left 08/06/2017   Procedure: COMPUTER ASSISTED TOTAL KNEE ARTHROPLASTY;  Surgeon: Dereck Leep, MD;  Location: [facility] ORS;  Service: Orthopedics;  Laterality: Left;   NM INTRAVENOUS INJECTION ([facility] HX)     receives biologic infusions every 5 weeks for rheumatoid arthritis. followed by dr. Meda Coffee at [place] N/A 01/17/2021   Procedure: PORTA CATH INSERTION;  Surgeon: Algernon Huxley, MD;  Location: [place] CV LAB;  Service: Cardiovascular;  Laterality: N/A;   TONSILLECTOMY     UPPER GASTROINTESTINAL ENDOSCOPY     WISDOM TOOTH EXTRACTION      Social History   Socioeconomic History   Marital status: Married    Spouse name: Not on file   Number of children: 2   Years of education: 14   Highest education level: Not on file  Occupational History   Not on file  Tobacco Use   Smoking status: Never   Smokeless tobacco: Never  Vaping Use   Vaping Use: Never used  Substance and Sexual Activity   Alcohol use: Yes    Comment: very occasional   Drug use: Never   Sexual activity: Yes  Other Topics Concern   Not on file  Social History Narrative   Not on file   Social Determinants of Health   Financial Resource Strain: Not on file  Food Insecurity: Not on file  Transportation Needs: Not on file  Physical Activity: Not on file  Stress: Not on file  Social Connections: Not on file  Intimate Partner Violence: Not on file    Family History  Problem Relation Age of Onset   Cancer Mother    Lung cancer Mother     Lung cancer Father    Colon cancer Neg Hx    Colon polyps Neg Hx    Rectal cancer Neg Hx      Current Outpatient Medications:    acetaminophen (TYLENOL) 325 MG tablet, Take 650 mg by mouth every 6 (six) hours as needed for moderate pain or headache. , Disp: , Rfl:    Artificial Tear Solution (GENTEAL TEARS) 0.1-0.2-0.3 % SOLN, Place 1 drop into both eyes daily as needed (dry eye). , Disp: , Rfl:    aspirin EC 81 MG tablet, Take 81 mg by mouth daily., Disp: , Rfl:    dexamethasone (DECADRON) 4 MG tablet, Take 2 tablets (8 mg total) by mouth daily. Take daily x 3 days starting the day after cisplatin chemotherapy. Take with food., Disp: 30 tablet, Rfl: 1   diclofenac sodium (VOLTAREN) 1 % GEL, Apply 2 g topically 4 (four) times daily as needed (pain). Apply to painful joint, Disp: , Rfl:  ferrous sulfate 325 (65 FE) MG tablet, Take 325 mg by mouth daily with breakfast., Disp: , Rfl:    hydrochlorothiazide (HYDRODIURIL) 12.5 MG tablet, Take 12.5 mg by mouth daily., Disp: , Rfl:    lidocaine-prilocaine (EMLA) cream, Apply to affected area once, Disp: 30 g, Rfl: 3   loperamide (IMODIUM) 2 MG capsule, Take 1 capsule (2 mg total) by mouth as needed for diarrhea or loose stools., Disp: 90 capsule, Rfl: 1   LORazepam (ATIVAN) 0.5 MG tablet, Take 0.5 mg by mouth once., Disp: , Rfl:    meloxicam (MOBIC) 7.5 MG tablet, Take 7.5 mg by mouth daily. For muscle pains., Disp: , Rfl:    memantine (NAMENDA) 10 MG tablet, Take 10 mg by mouth 2 (two) times daily., Disp: , Rfl:    Menthol, Topical Analgesic, (BIOFREEZE EX), Apply 1 application topically as needed (for knee/leg pain)., Disp: , Rfl:    mirtazapine (REMERON) 7.5 MG tablet, Take 7.5 mg by mouth at bedtime., Disp: , Rfl:    Multiple Vitamins-Minerals (OCUVITE PO), Take 0.5 tablets by mouth in the morning and at bedtime., Disp: , Rfl:    ondansetron (ZOFRAN) 8 MG tablet, Take 1 tablet (8 mg total) by mouth 2 (two) times daily as needed. Start on the  third day after cisplatin chemotherapy., Disp: 30 tablet, Rfl: 1   oxybutynin (DITROPAN-XL) 10 MG 24 hr tablet, Take 10 mg by mouth at bedtime. Urinary Frequency., Disp: , Rfl:    oxyCODONE (OXY IR/ROXICODONE) 5 MG immediate release tablet, Take 1-2 tablets (5-10 mg total) by mouth every 6 (six) hours as needed for severe pain., Disp: 90 tablet, Rfl: 0   pantoprazole (PROTONIX) 40 MG tablet, Take 1 tablet (40 mg total) by mouth daily., Disp: 60 tablet, Rfl: 3   prochlorperazine (COMPAZINE) 10 MG tablet, Take 1 tablet (10 mg total) by mouth every 6 (six) hours as needed (Nausea or vomiting)., Disp: 30 tablet, Rfl: 1   sulfaSALAzine (AZULFIDINE) 500 MG tablet, Take 500 mg by mouth 2 (two) times daily. Anti-Inflammatory for 90 days., Disp: , Rfl:    triamcinolone cream (KENALOG) 0.5 %, Apply topically., Disp: , Rfl:    atorvastatin (LIPITOR) 10 MG tablet, Take 5 mg by mouth daily. In the morning (Patient not taking: Reported on 01/13/2021), Disp: , Rfl:    donepezil (ARICEPT) 10 MG tablet, Take 10 mg by mouth at bedtime. (Patient not taking: Reported on 12/24/2020), Disp: , Rfl:    fluticasone (FLONASE) 50 MCG/ACT nasal spray, Place into both nostrils daily. (Patient not taking: Reported on 01/13/2021), Disp: , Rfl:    lisinopril (ZESTRIL) 20 MG tablet, Take 20 mg by mouth daily. For BP. (Patient not taking: Reported on 01/13/2021), Disp: , Rfl:    potassium chloride SA (KLOR-CON) 20 MEQ tablet, Take 1 tablet (20 mEq total) by mouth 2 (two) times daily., Disp: 14 tablet, Rfl: 0  Physical exam:  Vitals:   01/31/21 0935  BP: (!) 166/73  Pulse: 94  Resp: 18  Temp: 98.2 F (36.8 C)  TempSrc: Tympanic  SpO2: 99%  Weight: 134 lb 4.8 oz (60.9 kg)   Physical Exam Constitutional:      Comments: Elderly frail woman sitting in a wheelchair.  Appears in no acute distress  Cardiovascular:     Rate and Rhythm: Normal rate and regular rhythm.     Heart sounds: Normal heart sounds.  Pulmonary:      Effort: Pulmonary effort is normal.     Breath sounds: Normal breath  sounds.  Abdominal:     General: Bowel sounds are normal.     Palpations: Abdomen is soft.  Skin:    General: Skin is warm and dry.  Neurological:     Mental Status: She is alert and oriented to person, place, and time.     CMP Latest Ref Rng & Units 01/31/2021  Glucose 70 - 99 mg/dL 123(H)  BUN 8 - 23 mg/dL 16  Creatinine 0.44 - 1.00 mg/dL 0.83  Sodium 135 - 145 mmol/L 139  Potassium 3.5 - 5.1 mmol/L 2.9(L)  Chloride 98 - 111 mmol/L 103  CO2 22 - 32 mmol/L 26  Calcium 8.9 - 10.3 mg/dL 8.3(L)  Total Protein 6.5 - 8.1 g/dL 6.6  Total Bilirubin 0.3 - 1.2 mg/dL 0.3  Alkaline Phos 38 - 126 U/L 124  AST 15 - 41 U/L 26  ALT 0 - 44 U/L 13   CBC Latest Ref Rng & Units 01/31/2021  WBC 4.0 - 10.5 K/uL 11.2(H)  Hemoglobin 12.0 - 15.0 g/dL 10.1(L)  Hematocrit 36.0 - 46.0 % 31.9(L)  Platelets 150 - 400 K/uL 197    No images are attached to the encounter.  PERIPHERAL VASCULAR CATHETERIZATION  Result Date: 01/17/2021 See surgical note for result.  NM PET Image Initial (PI) Skull Base To Thigh (F-18 FDG)  Result Date: 01/04/2021 CLINICAL DATA:  Initial treatment strategy for biopsy demonstrating adenocarcinoma in left lobe of liver. Dementia. EXAM: NUCLEAR MEDICINE PET SKULL BASE TO THIGH TECHNIQUE: 7.5 mCi F-18 FDG was injected intravenously. Full-ring PET imaging was performed from the skull base to thigh after the radiotracer. CT data was obtained and used for attenuation correction and anatomic localization. Fasting blood glucose: 84 mg/dl COMPARISON:  Abdominopelvic CT of 11/24/2020.  CTA chest 05/24/2019 FINDINGS: Mediastinal blood pool activity: SUV max 2.3 Liver activity: SUV max NA NECK: No areas of abnormal hypermetabolism. Incidental CT findings: No cervical adenopathy. CHEST: No pulmonary parenchymal or thoracic nodal hypermetabolism. Incidental CT findings: Aortic atherosclerosis. Mild cardiomegaly. No  thoracic adenopathy. ABDOMEN/PELVIS: Dominant left hepatic lobe mass measures on the order of 14.6 x 10.2 cm and a S.U.V. max of 11.4 on 113/3. Porta hepatis nodes detailed on the prior CT are hypermetabolic. Example at 2.2 cm and a S.U.V. max of 4.7 on 125/3. Adjacent to this, a gastrohepatic ligament node measures 1.3 cm and a S.U.V. max of 4.7 on 126/3. Sigmoid hypermetabolism without well-defined mass, favored to be physiologic. Incidental CT findings: Deferred to recent diagnostic CT. Normal adrenal glands. Low-density bilateral renal lesions are likely cysts. Abdominal aortic atherosclerosis. Cholecystectomy. Extensive colonic diverticulosis. Hysterectomy. Pelvic floor laxity. SKELETON: No abnormal marrow activity. Incidental CT findings: Osteopenia. IMPRESSION: 1. Marked hypermetabolism corresponds to the dominant left hepatic lobe adenocarcinoma. 2. Porta hepatis hypermetabolic nodes, consistent with metastatic disease. 3. No extrahepatic hypermetabolic primary identified. Electronically Signed   By: Abigail Miyamoto M.D.   On: 01/04/2021 15:45     Assessment and plan- Patient is a 80 y.o. 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  She is here for on treatment assessment prior to cycle 1 day 1 of palliative gemcitabine chemotherapy  Counts okay to proceed with cycle 1 day 1 of gemcitabine chemotherapy today.  She is receiving this at a reduced dose of 800 mg per metered square.  Plan is to do it 2 weeks on and 1 week off.  If she tolerates 2 cycles without significant side effects I will consider adding cisplatin to her regimen.  Discussed risks and benefits of chemotherapy including all but not limited to nausea, vomiting, low blood counts, risk of infections and hospitalizations.  Treatment is being given with a palliative intent.  Patient understands and agrees to proceed as planned.  She will directly proceed for cycle 1 day 8 of gemcitabine chemotherapy next week  and will be seen by covering NP in 3 weeks with labs and for cycle 2-day 1 of gemcitabine chemotherapy.  Possible you do need on day 3  Hypokalemia: We will give her 20 mEq of IV potassium today and send her home with oral potassium.  Possible IV potassium next week.   Visit Diagnosis 1. Encounter for antineoplastic chemotherapy   2. Hypokalemia   3. Primary cholangiocarcinoma of intrahepatic bile duct ([place])      Dr. Randa Evens, MD, MPH [facility] [phone] 01/31/2021 4:25 PM

## 2021-01-31 NOTE — Patient Instructions (Signed)
[facility]  Discharge Instructions: Thank you for choosing [facility] to provide your oncology and hematology care.  If you have a lab appointment with the [facility], please go directly to the [facility] and check in at the registration area.  Wear comfortable clothing and clothing appropriate for easy access to any Portacath or PICC line.   We strive to give you quality time with your provider. You may need to reschedule your appointment if you arrive late (15 or more minutes).  Arriving late affects you and other patients whose appointments are after yours.  Also, if you miss three or more appointments without notifying the office, you may be dismissed from the clinic at the provider's discretion.      For prescription refill requests, have your pharmacy contact our office and allow 72 hours for refills to be completed.    Today you received the following chemotherapy and/or immunotherapy agents: Gemzar      To help prevent nausea and vomiting after your treatment, we encourage you to take your nausea medication as directed.  BELOW ARE SYMPTOMS THAT SHOULD BE REPORTED IMMEDIATELY: *FEVER GREATER THAN 100.4 F (38 C) OR HIGHER *CHILLS OR SWEATING *NAUSEA AND VOMITING THAT IS NOT CONTROLLED WITH YOUR NAUSEA MEDICATION *UNUSUAL SHORTNESS OF BREATH *UNUSUAL BRUISING OR BLEEDING *URINARY PROBLEMS (pain or burning when urinating, or frequent urination) *BOWEL PROBLEMS (unusual diarrhea, constipation, pain near the anus) TENDERNESS IN MOUTH AND THROAT WITH OR WITHOUT PRESENCE OF ULCERS (sore throat, sores in mouth, or a toothache) UNUSUAL RASH, SWELLING OR PAIN  UNUSUAL VAGINAL DISCHARGE OR ITCHING   Items with * indicate a potential emergency and should be followed up as soon as possible or go to the Emergency Department if any problems should occur.  Please show the CHEMOTHERAPY ALERT CARD or IMMUNOTHERAPY ALERT CARD at check-in to  the Emergency Department and triage nurse.  Should you have questions after your visit or need to cancel or reschedule your appointment, please contact [address]  [phone] and follow the prompts.  Office hours are 8:00 a.m. to 4:30 p.m. Monday - Friday. Please note that voicemails left after 4:00 p.m. may not be returned until the following business day.  We are closed weekends and major holidays. You have access to a nurse at all times for urgent questions. Please call the main number to the clinic [phone] and follow the prompts.  For any non-urgent questions, you may also contact your provider using MyChart. We now offer e-Visits for anyone 57 and older to request care online for non-urgent symptoms. For details visit [email or web address].   Also download the MyChart app! Go to the app store, search "MyChart", open the app, select [facility], and log in with your MyChart username and password.  Due to Covid, a mask is required upon entering the hospital/clinic. If you do not have a mask, one will be given to you upon arrival. For doctor visits, patients may have 1 support person aged 85 or older with them. For treatment visits, patients cannot have anyone with them due to current Covid guidelines and our immunocompromised population. Gemcitabine injection What is this medication? GEMCITABINE (jem SYE ta been) is a chemotherapy drug. This medicine is used to treat many types of cancer like breast cancer, lung cancer, pancreatic cancer, and ovarian cancer. This medicine may be used for other purposes; ask your health care provider or pharmacist if you have questions. COMMON BRAND NAME(S):  Gemzar, Infugem What should I tell my care team before I take this medication? They need to know if you have any of these conditions: blood disorders infection kidney disease liver disease lung or breathing disease, like asthma recent or ongoing radiation  therapy an unusual or allergic reaction to gemcitabine, other chemotherapy, other medicines, foods, dyes, or preservatives pregnant or trying to get pregnant breast-feeding How should I use this medication? This drug is given as an infusion into a vein. It is administered in a hospital or clinic by a specially trained health care professional. Talk to your pediatrician regarding the use of this medicine in children. Special care may be needed. Overdosage: If you think you have taken too much of this medicine contact a poison control center or emergency room at once. NOTE: This medicine is only for you. Do not share this medicine with others. What if I miss a dose? It is important not to miss your dose. Call your doctor or health care professional if you are unable to keep an appointment. What may interact with this medication? medicines to increase blood counts like filgrastim, pegfilgrastim, sargramostim some other chemotherapy drugs like cisplatin vaccines Talk to your doctor or health care professional before taking any of these medicines: acetaminophen aspirin ibuprofen ketoprofen naproxen This list may not describe all possible interactions. Give your health care provider a list of all the medicines, herbs, non-prescription drugs, or dietary supplements you use. Also tell them if you smoke, drink alcohol, or use illegal drugs. Some items may interact with your medicine. What should I watch for while using this medication? Visit your doctor for checks on your progress. This drug may make you feel generally unwell. This is not uncommon, as chemotherapy can affect healthy cells as well as cancer cells. Report any side effects. Continue your course of treatment even though you feel ill unless your doctor tells you to stop. In some cases, you may be given additional medicines to help with side effects. Follow all directions for their use. Call your doctor or health care professional for  advice if you get a fever, chills or sore throat, or other symptoms of a cold or flu. Do not treat yourself. This drug decreases your body's ability to fight infections. Try to avoid being around people who are sick. This medicine may increase your risk to bruise or bleed. Call your doctor or health care professional if you notice any unusual bleeding. Be careful brushing and flossing your teeth or using a toothpick because you may get an infection or bleed more easily. If you have any dental work done, tell your dentist you are receiving this medicine. Avoid taking products that contain aspirin, acetaminophen, ibuprofen, naproxen, or ketoprofen unless instructed by your doctor. These medicines may hide a fever. Do not become pregnant while taking this medicine or for 6 months after stopping it. Women should inform their doctor if they wish to become pregnant or think they might be pregnant. Men should not father a child while taking this medicine and for 3 months after stopping it. There is a potential for serious side effects to an unborn child. Talk to your health care professional or pharmacist for more information. Do not breast-feed an infant while taking this medicine or for at least 1 week after stopping it. Men should inform their doctors if they wish to father a child. This medicine may lower sperm counts. Talk with your doctor or health care professional if you are concerned about your fertility.  What side effects may I notice from receiving this medication? Side effects that you should report to your doctor or health care professional as soon as possible: allergic reactions like skin rash, itching or hives, swelling of the face, lips, or tongue breathing problems pain, redness, or irritation at site where injected signs and symptoms of a dangerous change in heartbeat or heart rhythm like chest pain; dizziness; fast or irregular heartbeat; palpitations; feeling faint or lightheaded, falls;  breathing problems signs of decreased platelets or bleeding - bruising, pinpoint red spots on the skin, black, tarry stools, blood in the urine signs of decreased red blood cells - unusually weak or tired, feeling faint or lightheaded, falls signs of infection - fever or chills, cough, sore throat, pain or difficulty passing urine signs and symptoms of kidney injury like trouble passing urine or change in the amount of urine signs and symptoms of liver injury like dark yellow or brown urine; general ill feeling or flu-like symptoms; light-colored stools; loss of appetite; nausea; right upper belly pain; unusually weak or tired; yellowing of the eyes or skin swelling of ankles, feet, hands Side effects that usually do not require medical attention (report to your doctor or health care professional if they continue or are bothersome): constipation diarrhea hair loss loss of appetite nausea rash vomiting This list may not describe all possible side effects. Call your doctor for medical advice about side effects. You may report side effects to FDA at 1-800-FDA-1088. Where should I keep my medication? This drug is given in a hospital or clinic and will not be stored at home. NOTE: This sheet is a summary. It may not cover all possible information. If you have questions about this medicine, talk to your doctor, pharmacist, or health care provider.  2022 Elsevier/Gold Standard (2017-05-16 00:00:00)

## 2021-01-31 NOTE — Progress Notes (Signed)
[facility]  Telephone:([phone] [phone]   Name: Trent Theisen Date: 01/31/2021 MRN: [id number]  DOB: 30-May-1940  Patient Care Team: Dion Body, MD as PCP - General (Family Medicine) Clent Jacks, RN as Oncology Nurse Navigator    REASON FOR CONSULTATION: Sarha Bartelt is a 80 y.o. female with multiple medical problems including dementia and stage IIIb intrahepatic cholangiocarcinoma felt not to be resectable due to patient's age, frailty, and the size of the liver mass.  Plan is to start systemic chemotherapy with single agent gemcitabine, given concern for possible intolerance of a more aggressive chemotherapy regimen.  Palliative care was consulted to help address goals.  SOCIAL HISTORY:     reports that she has never smoked. She has never used smokeless tobacco. She reports current alcohol use. She reports that she does not use drugs.  Patient married and lives at home with her husband.  She has a daughter who lives nearby and another daughter in [place].  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  ADVANCE DIRECTIVES:  Not on file  CODE STATUS:   PAST MEDICAL HISTORY: Past Medical History:  Diagnosis Date   Allergy    Anemia    Arthritis    rheumatoid /knees/hands/shoulders (Right shoulder) infusion every 5 weeks   Bladder incontinence    Chicken pox    Dyspnea    with exertion or far walking   Gastric ulcer    GERD (gastroesophageal reflux disease)    Heart murmur    Hypercholesteremia    Hyperlipidemia, unspecified    Hypertension    controlled with meds;    MDS (myelodysplastic syndrome) ([place]) 2000   Memory deficit    Myelodysplastic syndrome (H[facility])    Rheumatoid arthritis ([place])    Ulcer     PAST SURGICAL HISTORY:  Past Surgical History:  Procedure Laterality Date   ABDOMINAL HYSTERECTOMY     APPENDECTOMY     as a child   CHOLECYSTECTOMY     COLONOSCOPY      COLONOSCOPY WITH PROPOFOL N/A 04/24/2017   Procedure: COLONOSCOPY WITH PROPOFOL;  Surgeon: Lollie Sails, MD;  Location: [facility] ENDOSCOPY;  Service: Endoscopy;  Laterality: N/A;   ESOPHAGOGASTRODUODENOSCOPY (EGD) WITH PROPOFOL N/A 04/24/2017   Procedure: ESOPHAGOGASTRODUODENOSCOPY (EGD) WITH PROPOFOL;  Surgeon: Lollie Sails, MD;  Location: [facility] ENDOSCOPY;  Service: Endoscopy;  Laterality: N/A;   JOINT REPLACEMENT     KNEE ARTHROPLASTY Right 02/21/2017   Procedure: COMPUTER ASSISTED TOTAL KNEE ARTHROPLASTY;  Surgeon: Dereck Leep, MD;  Location: [facility] ORS;  Service: Orthopedics;  Laterality: Right;   KNEE ARTHROPLASTY Left 08/06/2017   Procedure: COMPUTER ASSISTED TOTAL KNEE ARTHROPLASTY;  Surgeon: Dereck Leep, MD;  Location: [facility] ORS;  Service: Orthopedics;  Laterality: Left;   NM INTRAVENOUS INJECTION ([facility] HX)     receives biologic infusions every 5 weeks for rheumatoid arthritis. followed by dr. Meda Coffee at [place] N/A 01/17/2021   Procedure: PORTA CATH INSERTION;  Surgeon: Algernon Huxley, MD;  Location: [place] [facility];  Service: Cardiovascular;  Laterality: N/A;   TONSILLECTOMY     UPPER GASTROINTESTINAL ENDOSCOPY     WISDOM TOOTH EXTRACTION      HEMATOLOGY/ONCOLOGY HISTORY:  Oncology History  Primary cholangiocarcinoma of intrahepatic bile duct ([place])  01/16/2021 Initial Diagnosis   Primary cholangiocarcinoma of intrahepatic bile duct ([place])   01/16/2021 Cancer Staging   Staging form: Intrahepatic Bile Duct, AJCC  8th Edition - Clinical stage from 01/16/2021: Stage IIIB (cT1b, cN1, cM0) - Signed by Sindy Guadeloupe, MD on 01/16/2021    01/31/2021 -  Chemotherapy   Patient is on Treatment Plan : BILIARY TRACT Cisplatin + Gemcitabine D1,8 q21d       ALLERGIES:  is allergic to pollen extract.  MEDICATIONS:  Current Outpatient Medications  Medication Sig Dispense Refill   acetaminophen (TYLENOL) 325 MG tablet Take 650 mg by mouth every 6  (six) hours as needed for moderate pain or headache.      Artificial Tear Solution (GENTEAL TEARS) 0.1-0.2-0.3 % SOLN Place 1 drop into both eyes daily as needed (dry eye).      aspirin EC 81 MG tablet Take 81 mg by mouth daily.     atorvastatin (LIPITOR) 10 MG tablet Take 5 mg by mouth daily. In the morning (Patient not taking: Reported on 01/13/2021)     dexamethasone (DECADRON) 4 MG tablet Take 2 tablets (8 mg total) by mouth daily. Take daily x 3 days starting the day after cisplatin chemotherapy. Take with food. 30 tablet 1   diclofenac sodium (VOLTAREN) 1 % GEL Apply 2 g topically 4 (four) times daily as needed (pain). Apply to painful joint     donepezil (ARICEPT) 10 MG tablet Take 10 mg by mouth at bedtime. (Patient not taking: Reported on 12/24/2020)     ferrous sulfate 325 (65 FE) MG tablet Take 325 mg by mouth daily with breakfast.     fluticasone (FLONASE) 50 MCG/ACT nasal spray Place into both nostrils daily. (Patient not taking: Reported on 01/13/2021)     hydrochlorothiazide (HYDRODIURIL) 12.5 MG tablet Take 12.5 mg by mouth daily.     lidocaine-prilocaine (EMLA) cream Apply to affected area once 30 g 3   lisinopril (ZESTRIL) 20 MG tablet Take 20 mg by mouth daily. For BP. (Patient not taking: Reported on 01/13/2021)     loperamide (IMODIUM) 2 MG capsule Take 1 capsule (2 mg total) by mouth as needed for diarrhea or loose stools. 90 capsule 1   LORazepam (ATIVAN) 0.5 MG tablet Take 0.5 mg by mouth once.     meloxicam (MOBIC) 7.5 MG tablet Take 7.5 mg by mouth daily. For muscle pains.     memantine (NAMENDA) 10 MG tablet Take 10 mg by mouth 2 (two) times daily.     Menthol, Topical Analgesic, (BIOFREEZE EX) Apply 1 application topically as needed (for knee/leg pain).     mirtazapine (REMERON) 7.5 MG tablet Take 7.5 mg by mouth at bedtime.     Multiple Vitamins-Minerals (OCUVITE PO) Take 0.5 tablets by mouth in the morning and at bedtime.     ondansetron (ZOFRAN) 8 MG tablet Take 1  tablet (8 mg total) by mouth 2 (two) times daily as needed. Start on the third day after cisplatin chemotherapy. 30 tablet 1   oxybutynin (DITROPAN-XL) 10 MG 24 hr tablet Take 10 mg by mouth at bedtime. Urinary Frequency.     oxyCODONE (OXY IR/ROXICODONE) 5 MG immediate release tablet Take 1-2 tablets (5-10 mg total) by mouth every 6 (six) hours as needed for severe pain. 90 tablet 0   pantoprazole (PROTONIX) 40 MG tablet Take 1 tablet (40 mg total) by mouth daily. 60 tablet 3   prochlorperazine (COMPAZINE) 10 MG tablet Take 1 tablet (10 mg total) by mouth every 6 (six) hours as needed (Nausea or vomiting). 30 tablet 1   sulfaSALAzine (AZULFIDINE) 500 MG tablet Take 500 mg by mouth 2 (two)  times daily. Anti-Inflammatory for 90 days.     triamcinolone cream (KENALOG) 0.5 % Apply topically.     No current facility-administered medications for this visit.   Facility-Administered Medications Ordered in Other Visits  Medication Dose Route Frequency Provider Last Rate Last Admin   dexamethasone (DECADRON) 10 mg in sodium chloride 0.9 % 50 mL IVPB  10 mg Intravenous Once Sindy Guadeloupe, MD       gemcitabine (GEMZAR) 1,254 mg in sodium chloride 0.9 % 250 mL chemo infusion  800 mg/m2 (Order-Specific) Intravenous Once Sindy Guadeloupe, MD       heparin lock flush 100 unit/mL  500 Units Intracatheter Once PRN Sindy Guadeloupe, MD       potassium chloride 20 mEq in 100 mL IVPB  20 mEq Intravenous Once Sindy Guadeloupe, MD        VITAL SIGNS: There were no vitals taken for this visit. There were no vitals filed for this visit.  Estimated body mass index is 29.06 kg/m as calculated from the following:   Height as of 01/17/21: _0  (1.448 m).   Weight as of an earlier encounter on 01/31/21: 134 lb 4.8 oz (60.9 kg).  LABS: CBC:    Component Value Date/Time   WBC 11.2 (H) 01/31/2021 0912   HGB 10.1 (L) 01/31/2021 0912   HCT 31.9 (L) 01/31/2021 0912   PLT 197 01/31/2021 0912   MCV 101.9 (H) 01/31/2021  0912   NEUTROABS 8.5 (H) 01/31/2021 0912   LYMPHSABS 1.5 01/31/2021 0912   MONOABS 1.1 (H) 01/31/2021 0912   EOSABS 0.1 01/31/2021 0912   BASOSABS 0.0 01/31/2021 0912   Comprehensive Metabolic Panel:    Component Value Date/Time   NA 139 01/31/2021 0912   K 2.9 (L) 01/31/2021 0912   CL 103 01/31/2021 0912   CO2 26 01/31/2021 0912   BUN 16 01/31/2021 0912   CREATININE 0.83 01/31/2021 0912   GLUCOSE 123 (H) 01/31/2021 0912   CALCIUM 8.3 (L) 01/31/2021 0912   AST 26 01/31/2021 0912   ALT 13 01/31/2021 0912   ALKPHOS 124 01/31/2021 0912   BILITOT 0.3 01/31/2021 0912   PROT 6.6 01/31/2021 0912   ALBUMIN 3.5 01/31/2021 0912    RADIOGRAPHIC STUDIES: PERIPHERAL VASCULAR CATHETERIZATION  Result Date: 01/17/2021 See surgical note for result.  NM PET Image Initial (PI) Skull Base To Thigh (F-18 FDG)  Result Date: 01/04/2021 CLINICAL DATA:  Initial treatment strategy for biopsy demonstrating adenocarcinoma in left lobe of liver. Dementia. EXAM: NUCLEAR MEDICINE PET SKULL BASE TO THIGH TECHNIQUE: 7.5 mCi F-18 FDG was injected intravenously. Full-ring PET imaging was performed from the skull base to thigh after the radiotracer. CT data was obtained and used for attenuation correction and anatomic localization. Fasting blood glucose: 84 mg/dl COMPARISON:  Abdominopelvic CT of 11/24/2020.  CTA chest 05/24/2019 FINDINGS: Mediastinal blood pool activity: SUV max 2.3 Liver activity: SUV max NA NECK: No areas of abnormal hypermetabolism. Incidental CT findings: No cervical adenopathy. CHEST: No pulmonary parenchymal or thoracic nodal hypermetabolism. Incidental CT findings: Aortic atherosclerosis. Mild cardiomegaly. No thoracic adenopathy. ABDOMEN/PELVIS: Dominant left hepatic lobe mass measures on the order of 14.6 x 10.2 cm and a S.U.V. max of 11.4 on 113/3. Porta hepatis nodes detailed on the prior CT are hypermetabolic. Example at 2.2 cm and a S.U.V. max of 4.7 on 125/3. Adjacent to this, a  gastrohepatic ligament node measures 1.3 cm and a S.U.V. max of 4.7 on 126/3. Sigmoid hypermetabolism without well-defined mass, favored to  be physiologic. Incidental CT findings: Deferred to recent diagnostic CT. Normal adrenal glands. Low-density bilateral renal lesions are likely cysts. Abdominal aortic atherosclerosis. Cholecystectomy. Extensive colonic diverticulosis. Hysterectomy. Pelvic floor laxity. SKELETON: No abnormal marrow activity. Incidental CT findings: Osteopenia. IMPRESSION: 1. Marked hypermetabolism corresponds to the dominant left hepatic lobe adenocarcinoma. 2. Porta hepatis hypermetabolic nodes, consistent with metastatic disease. 3. No extrahepatic hypermetabolic primary identified. Electronically Signed   By: Abigail Miyamoto M.D.   On: 01/04/2021 15:45    PERFORMANCE STATUS (ECOG) : 2 - Symptomatic, <50% confined to bed  Review of Systems Unless otherwise noted, a complete review of systems is negative.  Physical Exam General: NAD Pulmonary: Unlabored Extremities: no edema, no joint deformities Skin: no rashes Neurological: Weakness but otherwise nonfocal  IMPRESSION: I met with patient and husband following their visit with Dr. [name].  I introduced palliative care services and attempted to establish therapeutic rapport.  Patient reports that she is much improved since I last saw her in the context of symptom management for URI.  She currently denies any distressing symptomatic concerns.  Patient endorses weakness and relies on her husband to help with daily tasks including bathing.  There is a daughter who lives nearby that has been relies on for support.  Husband feels like patient has adequate support currently.  With patient and husband endorse a desire to pursue cancer treatment.  Husband sounds optimistic regarding a good outcome.  I requested the husband bring in ACP documents which have been previously completed by an attorney.  I sent them home with a MOST form to  review.  Husband states that patient would remain a full code for now.  He cites a family friend that expired in the hospital due to being a DNR.  I explained the probable future need to readdress this given her cancer.  PLAN: -Continue current scope of treatment -Husband to bring in ACP documents -MOST Form reviewed -Follow-up telephone visit 1 month   Patient expressed understanding and was in agreement with this plan. She also understands that She can call the clinic at any time with any questions, concerns, or complaints.     Time Total: 20 minutes  Visit consisted of counseling and education dealing with the complex and emotionally intense issues of symptom management and palliative care in the setting of serious and potentially life-threatening illness.Greater than 50%  of this time was spent counseling and coordinating care related to the above assessment and plan.  Signed by: Altha Harm, PhD, NP-C

## 2021-02-01 ENCOUNTER — Telehealth: Payer: Self-pay

## 2021-02-01 NOTE — Telephone Encounter (Signed)
T/C to patient for follow up after receiving first infusion yesterday.   Phone rang and rang but no answer and no voice mail available.

## 2021-02-04 ENCOUNTER — Other Ambulatory Visit: Payer: Self-pay | Admitting: Oncology

## 2021-02-04 NOTE — Telephone Encounter (Signed)
  Component Ref Range & Units 4 d ago  (01/31/21) 1 mo ago  (12/24/20) 5 mo ago  (08/13/20) 5 mo ago  (08/12/20) 7 mo ago  (07/08/20) 7 mo ago  (07/07/20) 7 mo ago  (07/06/20)  Potassium 3.5 - 5.1 mmol/L 2.9 Low   3.5  4.8  5.2 High   4.4  3.8  4.9

## 2021-02-07 ENCOUNTER — Inpatient Hospital Stay: Payer: Medicare Other

## 2021-02-07 ENCOUNTER — Other Ambulatory Visit: Payer: Self-pay

## 2021-02-07 ENCOUNTER — Other Ambulatory Visit: Payer: Self-pay | Admitting: Oncology

## 2021-02-07 ENCOUNTER — Inpatient Hospital Stay: Payer: Medicare Other | Attending: Oncology

## 2021-02-07 VITALS — BP 160/83 | HR 91 | Temp 97.3°F | Resp 18 | Wt 135.5 lb

## 2021-02-07 DIAGNOSIS — C221 Intrahepatic bile duct carcinoma: Secondary | ICD-10-CM | POA: Insufficient documentation

## 2021-02-07 DIAGNOSIS — Z7952 Long term (current) use of systemic steroids: Secondary | ICD-10-CM | POA: Insufficient documentation

## 2021-02-07 DIAGNOSIS — D649 Anemia, unspecified: Secondary | ICD-10-CM | POA: Insufficient documentation

## 2021-02-07 DIAGNOSIS — G893 Neoplasm related pain (acute) (chronic): Secondary | ICD-10-CM | POA: Insufficient documentation

## 2021-02-07 DIAGNOSIS — R197 Diarrhea, unspecified: Secondary | ICD-10-CM | POA: Diagnosis not present

## 2021-02-07 DIAGNOSIS — R059 Cough, unspecified: Secondary | ICD-10-CM | POA: Insufficient documentation

## 2021-02-07 DIAGNOSIS — D696 Thrombocytopenia, unspecified: Secondary | ICD-10-CM | POA: Insufficient documentation

## 2021-02-07 DIAGNOSIS — Z5111 Encounter for antineoplastic chemotherapy: Secondary | ICD-10-CM | POA: Insufficient documentation

## 2021-02-07 DIAGNOSIS — Z79899 Other long term (current) drug therapy: Secondary | ICD-10-CM | POA: Insufficient documentation

## 2021-02-07 DIAGNOSIS — E876 Hypokalemia: Secondary | ICD-10-CM

## 2021-02-07 DIAGNOSIS — Z7982 Long term (current) use of aspirin: Secondary | ICD-10-CM | POA: Diagnosis not present

## 2021-02-07 LAB — COMPREHENSIVE METABOLIC PANEL
ALT: 24 U/L (ref 0–44)
AST: 32 U/L (ref 15–41)
Albumin: 3.4 g/dL — ABNORMAL LOW (ref 3.5–5.0)
Alkaline Phosphatase: 104 U/L (ref 38–126)
Anion gap: 12 (ref 5–15)
BUN: 19 mg/dL (ref 8–23)
CO2: 22 mmol/L (ref 22–32)
Calcium: 8.3 mg/dL — ABNORMAL LOW (ref 8.9–10.3)
Chloride: 104 mmol/L (ref 98–111)
Creatinine, Ser: 0.67 mg/dL (ref 0.44–1.00)
GFR, Estimated: >60 mL/min (ref >60)
Glucose, Bld: 89 mg/dL (ref 70–99)
Potassium: 3.1 mmol/L — ABNORMAL LOW (ref 3.5–5.1)
Sodium: 138 mmol/L (ref 135–145)
Total Bilirubin: 0.4 mg/dL (ref 0.3–1.2)
Total Protein: 6.8 g/dL (ref 6.5–8.1)

## 2021-02-07 LAB — CBC WITH DIFFERENTIAL/PLATELET
Abs Immature Granulocytes: 0.06 10*3/uL (ref 0.00–0.07)
Basophils Absolute: 0 10*3/uL (ref 0.0–0.1)
Basophils Relative: 0 %
Eosinophils Absolute: 0.1 10*3/uL (ref 0.0–0.5)
Eosinophils Relative: 1 %
HCT: 29.7 % — ABNORMAL LOW (ref 36.0–46.0)
Hemoglobin: 9.7 g/dL — ABNORMAL LOW (ref 12.0–15.0)
Immature Granulocytes: 1 %
Lymphocytes Relative: 17 %
Lymphs Abs: 1.9 10*3/uL (ref 0.7–4.0)
MCH: 33.1 pg (ref 26.0–34.0)
MCHC: 32.7 g/dL (ref 30.0–36.0)
MCV: 101.4 fL — ABNORMAL HIGH (ref 80.0–100.0)
Monocytes Absolute: 0.8 10*3/uL (ref 0.1–1.0)
Monocytes Relative: 7 %
Neutro Abs: 8.1 10*3/uL — ABNORMAL HIGH (ref 1.7–7.7)
Neutrophils Relative %: 74 %
Platelets: 97 10*3/uL — ABNORMAL LOW (ref 150–400)
RBC: 2.93 MIL/uL — ABNORMAL LOW (ref 3.87–5.11)
RDW: 15.7 % — ABNORMAL HIGH (ref 11.5–15.5)
WBC: 10.9 10*3/uL — ABNORMAL HIGH (ref 4.0–10.5)
nRBC: 0 % (ref 0.0–0.2)

## 2021-02-07 MED ORDER — POTASSIUM CHLORIDE IN NACL 20-0.9 MEQ/L-% IV SOLN
Freq: Once | INTRAVENOUS | Status: AC
  Filled 2021-02-07: qty 1000

## 2021-02-07 MED ORDER — SODIUM CHLORIDE 0.9% FLUSH
10.0000 mL | INTRAVENOUS | Status: DC | PRN
  Administered 2021-02-07: 10 mL via INTRAVENOUS
  Filled 2021-02-07: qty 10

## 2021-02-07 MED ORDER — HEPARIN SOD (PORK) LOCK FLUSH 100 UNIT/ML IV SOLN
INTRAVENOUS | Status: AC
  Administered 2021-02-07: 500 [IU]
  Filled 2021-02-07: qty 5

## 2021-02-07 MED ORDER — SODIUM CHLORIDE 0.9 % IV SOLN
Freq: Once | INTRAVENOUS | Status: AC
  Filled 2021-02-07: qty 250

## 2021-02-07 MED ORDER — HEPARIN SOD (PORK) LOCK FLUSH 100 UNIT/ML IV SOLN
500.0000 [IU] | Freq: Once | INTRAVENOUS | Status: DC
  Filled 2021-02-07: qty 5

## 2021-02-07 MED ORDER — SODIUM CHLORIDE 0.9 % IV SOLN
10.0000 mg | Freq: Once | INTRAVENOUS | Status: AC
  Administered 2021-02-07: 10 mg via INTRAVENOUS
  Filled 2021-02-07: qty 10

## 2021-02-07 MED ORDER — SODIUM CHLORIDE 0.9 % IV SOLN
800.0000 mg/m2 | Freq: Once | INTRAVENOUS | Status: AC
  Administered 2021-02-07: 1254 mg via INTRAVENOUS
  Filled 2021-02-07: qty 26.3

## 2021-02-07 MED ORDER — HEPARIN SOD (PORK) LOCK FLUSH 100 UNIT/ML IV SOLN
500.0000 [IU] | Freq: Once | INTRAVENOUS | Status: AC | PRN
  Filled 2021-02-07: qty 5

## 2021-02-07 NOTE — Patient Instructions (Signed)
Sylvia Miller AT [facility]  Discharge Instructions: ?Thank you for choosing [facility] to provide your oncology and hematology care.  ?If you have a lab appointment with the Cancer Center, please go directly to the Cancer Center and check in at the registration area. ? ?Wear comfortable clothing and clothing appropriate for easy access to any Portacath or PICC line.  ? ?We strive to give you quality time with your provider. You may need to reschedule your appointment if you arrive late (15 or more minutes).  Arriving late affects you and other patients whose appointments are after yours.  Also, if you miss three or more appointments without notifying the office, you may be dismissed from the clinic at the provider?s discretion.    ?  ?For prescription refill requests, have your pharmacy contact our office and allow 72 hours for refills to be completed.   ? ?Today you received the following chemotherapy and/or immunotherapy agents Gemzar    ?  ?To help prevent nausea and vomiting after your treatment, we encourage you to take your nausea medication as directed. ? ?BELOW ARE SYMPTOMS THAT SHOULD BE REPORTED IMMEDIATELY: ?*FEVER GREATER THAN 100.4 F (38 ?C) OR HIGHER ?*CHILLS OR SWEATING ?*NAUSEA AND VOMITING THAT IS NOT CONTROLLED WITH YOUR NAUSEA MEDICATION ?*UNUSUAL SHORTNESS OF BREATH ?*UNUSUAL BRUISING OR BLEEDING ?*URINARY PROBLEMS (pain or burning when urinating, or frequent urination) ?*BOWEL PROBLEMS (unusual diarrhea, constipation, pain near the anus) ?TENDERNESS IN MOUTH AND THROAT WITH OR WITHOUT PRESENCE OF ULCERS (sore throat, sores in mouth, or a toothache) ?UNUSUAL RASH, SWELLING OR PAIN  ?UNUSUAL VAGINAL DISCHARGE OR ITCHING  ? ?Items with * indicate a potential emergency and should be followed up as soon as possible or go to the Emergency Department if any problems should occur. ? ?Please show the CHEMOTHERAPY ALERT CARD or IMMUNOTHERAPY ALERT CARD at check-in to the  Emergency Department and triage nurse. ? ?Should you have questions after your visit or need to cancel or reschedule your appointment, please contact Syl[facility] AT [facility]  [phone] and follow the prompts.  Office hours are 8:00 a.m. to 4:30 p.m. Monday - Friday. Please note that voicemails left after 4:00 p.m. may not be returned until the following business day.  We are closed weekends and major holidays. You have access to a nurse at all times for urgent questions. Please call the main number to the clinic [phone] and follow the prompts. ? ?For any non-urgent questions, you may also contact your provider using MyChart. We now offer e-Visits for anyone 18 and older to request care online for non-urgent symptoms. For details visit [email or web address]. ?  ?Also download the MyChart app! Go to the app store, search "MyChart", open the app, select [facility], and log in with your MyChart username and password. ? ?Due to Covid, a mask is required upon entering the hospital/clinic. If you do not have a mask, one will be given to you upon arrival. For doctor visits, patients may have 1 support person aged 18 or older with them. For treatment visits, patients cannot have anyone with them due to current Covid guidelines and our immunocompromised population.  ?

## 2021-02-07 NOTE — Progress Notes (Signed)
Per Dr. [name] okay to proceed with treatment with 02/07/21 labs including platelets of 97, pt to receive 20 meq of IV Potassium.

## 2021-02-08 ENCOUNTER — Inpatient Hospital Stay: Payer: Medicare Other

## 2021-02-08 DIAGNOSIS — Z5111 Encounter for antineoplastic chemotherapy: Secondary | ICD-10-CM | POA: Diagnosis not present

## 2021-02-08 DIAGNOSIS — C221 Intrahepatic bile duct carcinoma: Secondary | ICD-10-CM

## 2021-02-08 LAB — CANCER ANTIGEN 19-9: CA 19-9: 2120 U/mL — ABNORMAL HIGH (ref 0–35)

## 2021-02-08 MED ORDER — PEGFILGRASTIM-CBQV 6 MG/0.6ML ~~LOC~~ SOSY
6.0000 mg | PREFILLED_SYRINGE | Freq: Once | SUBCUTANEOUS | Status: AC
  Administered 2021-02-08: 6 mg via SUBCUTANEOUS
  Filled 2021-02-08: qty 0.6

## 2021-02-11 ENCOUNTER — Other Ambulatory Visit: Payer: Self-pay

## 2021-02-11 ENCOUNTER — Inpatient Hospital Stay (HOSPITAL_BASED_OUTPATIENT_CLINIC_OR_DEPARTMENT_OTHER): Payer: Medicare Other | Admitting: Hospice and Palliative Medicine

## 2021-02-11 ENCOUNTER — Telehealth: Payer: Self-pay | Admitting: *Deleted

## 2021-02-11 VITALS — BP 125/93 | HR 102 | Temp 98.3°F | Resp 17

## 2021-02-11 DIAGNOSIS — R059 Cough, unspecified: Secondary | ICD-10-CM

## 2021-02-11 DIAGNOSIS — C221 Intrahepatic bile duct carcinoma: Secondary | ICD-10-CM

## 2021-02-11 DIAGNOSIS — Z5111 Encounter for antineoplastic chemotherapy: Secondary | ICD-10-CM | POA: Diagnosis not present

## 2021-02-11 MED ORDER — BENZONATATE 100 MG PO CAPS: 100.0000 mg | ORAL_CAPSULE | Freq: Three times a day (TID) | ORAL | 0 refills | Status: DC | PRN

## 2021-02-11 NOTE — Telephone Encounter (Signed)
[name] called stating that he spoke with On Call doctor last night and he was to have gotten a call to bring Sylvia Miller in to be seen today, but he has not heard from anyone. Please advise

## 2021-02-11 NOTE — Telephone Encounter (Signed)
Called and spoke to pt's husband. Offered virtual visit, but would prefer to be seen in person. Appointment with [facility] scheduled this afternoon. Husband aware of appointment time.

## 2021-02-11 NOTE — Progress Notes (Signed)
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  Patient is on systemic chemotherapy with single agent gemcitabine, given concern for possible intolerance of a more aggressive chemotherapy regimen.  I saw patient on 01/18/2021 for URI symptoms with rhinorrhea/congestion and cough.  Patient was treated symptomatically with recommended to obtain COVID/influenza testing.  However, is unclear if she ever obtain testing.  Patient last saw Dr. Janese Miller on 01/31/2021 and received cycle 1, day 1 of dose reduced gemcitabine chemotherapy.  Patient subsequently received day 8, cycle 1 second dose of gemcitabine on 02/07/2021, followed by [facility] on 02/08/2021.  Patient presents to [name] today requesting cough medication.  She says that she has had a persistent cough over the past couple weeks.  She reports having a postnasal drip, which leads to coughing.  She is taking Mucinex DM but not having much improvement.  She denies fever or chills.  No shortness of breath or wheezing.  She has had intermittent diarrhea/constipation.  She is also had intermittent abdominal pain but is not regularly taking oxycodone.  Denies any neurologic complaints. Denies any easy bleeding or bruising. Denies chest pain. Denies urinary complaints. Patient offers no further specific complaints today.  PAST MEDICAL HISTORY: Past Medical History:  Diagnosis Date   Allergy    Anemia    Arthritis    rheumatoid  /knees/hands/shoulders (Right shoulder) infusion every 5 weeks   Bladder incontinence    Chicken pox    Dyspnea    with exertion or far walking   Gastric ulcer    GERD (gastroesophageal reflux disease)    Heart murmur    Hypercholesteremia    Hyperlipidemia, unspecified    Hypertension    controlled with meds;    MDS (myelodysplastic syndrome) ([place]) 2000   Memory deficit    Myelodysplastic syndrome ([place])    Rheumatoid arthritis ([place])    Ulcer     PAST SURGICAL HISTORY:  Past Surgical History:  Procedure Laterality Date   ABDOMINAL HYSTERECTOMY     APPENDECTOMY     as a child   CHOLECYSTECTOMY     COLONOSCOPY     COLONOSCOPY WITH PROPOFOL N/A 04/24/2017   Procedure: COLONOSCOPY WITH PROPOFOL;  Surgeon: Sylvia Sails, MD;  Location: [facility] ENDOSCOPY;  Service: Endoscopy;  Laterality: N/A;   ESOPHAGOGASTRODUODENOSCOPY (EGD) WITH PROPOFOL N/A 04/24/2017   Procedure: ESOPHAGOGASTRODUODENOSCOPY (EGD) WITH PROPOFOL;  Surgeon: Sylvia Sails, MD;  Location: [facility] ENDOSCOPY;  Service: Endoscopy;  Laterality: N/A;   JOINT REPLACEMENT     KNEE ARTHROPLASTY Right 02/21/2017   Procedure: COMPUTER ASSISTED TOTAL KNEE ARTHROPLASTY;  Surgeon: Dereck Leep, MD;  Location: [facility] ORS;  Service: Orthopedics;  Laterality: Right;   KNEE ARTHROPLASTY Left 08/06/2017   Procedure: COMPUTER ASSISTED TOTAL KNEE ARTHROPLASTY;  Surgeon: Dereck Leep, MD;  Location: [facility] ORS;  Service: Orthopedics;  Laterality: Left;   NM INTRAVENOUS INJECTION ([facility] HX)     receives biologic infusions every 5 weeks for rheumatoid arthritis. followed by dr. Meda Miller at [facility]  CATH INSERTION N/A 01/17/2021   Procedure: PORTA CATH INSERTION;  Surgeon: Sylvia Huxley, MD;  Location: [facility];  Service: Cardiovascular;  Laterality: N/A;   TONSILLECTOMY     UPPER GASTROINTESTINAL ENDOSCOPY     WISDOM TOOTH EXTRACTION      HEMATOLOGY/ONCOLOGY HISTORY:  Oncology History  Primary  cholangiocarcinoma of intrahepatic bile duct ([place])  01/16/2021 Initial Diagnosis   Primary cholangiocarcinoma of intrahepatic bile duct ([place])   01/16/2021 Cancer Staging   Staging form: Intrahepatic Bile Duct, AJCC 8th Edition - Clinical stage from 01/16/2021: Stage IIIB (cT1b, cN1, cM0) - Signed by Sylvia Guadeloupe, MD on 01/16/2021   01/31/2021 -  Chemotherapy   Patient is on Treatment Plan : BILIARY TRACT Cisplatin + Gemcitabine D1,8 q21d       ALLERGIES:  is allergic to pollen extract.  MEDICATIONS:  Current Outpatient Medications  Medication Sig Dispense Refill   acetaminophen (TYLENOL) 325 MG tablet Take 650 mg by mouth every 6 (six) hours as needed for moderate pain or headache.      Artificial Tear Solution (GENTEAL TEARS) 0.1-0.2-0.3 % SOLN Place 1 drop into both eyes daily as needed (dry eye).      aspirin EC 81 MG tablet Take 81 mg by mouth daily.     atorvastatin (LIPITOR) 10 MG tablet Take 5 mg by mouth daily. In the morning (Patient not taking: Reported on 01/13/2021)     dexamethasone (DECADRON) 4 MG tablet Take 2 tablets (8 mg total) by mouth daily. Take daily x 3 days starting the day after cisplatin chemotherapy. Take with food. 30 tablet 1   diclofenac sodium (VOLTAREN) 1 % GEL Apply 2 g topically 4 (four) times daily as needed (pain). Apply to painful joint     donepezil (ARICEPT) 10 MG tablet Take 10 mg by mouth at bedtime. (Patient not taking: Reported on 12/24/2020)     ferrous sulfate 325 (65 FE) MG tablet Take 325 mg by mouth daily with breakfast.     fluticasone (FLONASE) 50 MCG/ACT nasal spray Place into both nostrils daily. (Patient not taking: Reported on 01/13/2021)     hydrochlorothiazide (HYDRODIURIL) 12.5 MG tablet Take 12.5 mg by mouth daily.     lidocaine-prilocaine (EMLA) cream Apply to affected area once 30 g 3   lisinopril (ZESTRIL) 20 MG tablet Take 20 mg by mouth daily. For BP. (Patient not taking: Reported on 01/13/2021)     loperamide (IMODIUM) 2  MG capsule Take 1 capsule (2 mg total) by mouth as needed for diarrhea or loose stools. 90 capsule 1   LORazepam (ATIVAN) 0.5 MG tablet Take 0.5 mg by mouth once.     meloxicam (MOBIC) 7.5 MG tablet Take 7.5 mg by mouth daily. For muscle pains.     memantine (NAMENDA) 10 MG tablet Take 10 mg by mouth 2 (two) times daily.     Menthol, Topical Analgesic, (BIOFREEZE EX) Apply 1 application topically as needed (for knee/leg pain).     mirtazapine (REMERON) 7.5 MG tablet Take 7.5 mg by mouth at bedtime.     Multiple Vitamins-Minerals (OCUVITE PO) Take 0.5 tablets by mouth in the morning and at bedtime.     ondansetron (ZOFRAN) 8 MG tablet Take 1 tablet (8 mg total) by mouth 2 (two) times daily as needed. Start on the third day after cisplatin chemotherapy. 30 tablet 1   oxybutynin (DITROPAN-XL) 10 MG 24 hr tablet Take 10 mg by mouth at bedtime. Urinary Frequency.  oxyCODONE (OXY IR/ROXICODONE) 5 MG immediate release tablet Take 1-2 tablets (5-10 mg total) by mouth every 6 (six) hours as needed for severe pain. 90 tablet 0   pantoprazole (PROTONIX) 40 MG tablet Take 1 tablet (40 mg total) by mouth daily. 60 tablet 3   potassium chloride SA (KLOR-CON M) 20 MEQ tablet TAKE 1 TABLET BY MOUTH TWICE DAILY 14 tablet 0   prochlorperazine (COMPAZINE) 10 MG tablet Take 1 tablet (10 mg total) by mouth every 6 (six) hours as needed (Nausea or vomiting). 30 tablet 1   sulfaSALAzine (AZULFIDINE) 500 MG tablet Take 500 mg by mouth 2 (two) times daily. Anti-Inflammatory for 90 days.     triamcinolone cream (KENALOG) 0.5 % Apply topically.     No current facility-administered medications for this visit.    VITAL SIGNS: There were no vitals taken for this visit. There were no vitals filed for this visit.  Estimated Miller mass index is 29.32 kg/m as calculated from the following:   Height as of 01/17/21: 4\' 9"  (1.448 m).   Weight as of 02/07/21: 135 lb 7.6 oz (61.4 kg).  LABS: CBC:    Component Value  Date/Time   WBC 10.9 (H) 02/07/2021 0836   HGB 9.7 (L) 02/07/2021 0836   HCT 29.7 (L) 02/07/2021 0836   PLT 97 (L) 02/07/2021 0836   MCV 101.4 (H) 02/07/2021 0836   NEUTROABS 8.1 (H) 02/07/2021 0836   LYMPHSABS 1.9 02/07/2021 0836   MONOABS 0.8 02/07/2021 0836   EOSABS 0.1 02/07/2021 0836   BASOSABS 0.0 02/07/2021 0836   Comprehensive Metabolic Panel:    Component Value Date/Time   NA 138 02/07/2021 0836   K 3.1 (L) 02/07/2021 0836   CL 104 02/07/2021 0836   CO2 22 02/07/2021 0836   BUN 19 02/07/2021 0836   CREATININE 0.67 02/07/2021 0836   GLUCOSE 89 02/07/2021 0836   CALCIUM 8.3 (L) 02/07/2021 0836   AST 32 02/07/2021 0836   ALT 24 02/07/2021 0836   ALKPHOS 104 02/07/2021 0836   BILITOT 0.4 02/07/2021 0836   PROT 6.8 02/07/2021 0836   ALBUMIN 3.4 (L) 02/07/2021 0836    RADIOGRAPHIC STUDIES: PERIPHERAL VASCULAR CATHETERIZATION  Result Date: 01/17/2021 See surgical note for result.   PERFORMANCE STATUS (ECOG) : 2 - Symptomatic, <50% confined to bed  Review of Systems Unless otherwise noted, a complete review of systems is negative.  Physical Exam General: NAD Cardiovascular: regular rate and rhythm Pulmonary: clear anterior/posterior fields Abdomen: soft, nontender, + bowel sounds GU: no suprapubic tenderness Extremities: no edema, no joint deformities Skin: no rashes Neurological: Weakness, some confusion at baseline, poor historian  Assessment and Plan- Patient is a 80 y.o. female with newly diagnosed stage IIIb cholangiocarcinoma who presents to [facility] for evaluation of cough   Cough -likely post viral with component of postnasal drip.  Recommended adding Flonase daily.  We will start patient on benzonatate as needed.  Intermittent diarrhea -likely from chemotherapy.  Antidiarrheals as needed.  Neoplasm related pain -discussed pain regimen in detail.  Recommended liberalizing oxycodone if needed.  Patient is scheduled to see Faythe Casa, NP on  02/21/2021.  We will plan to follow-up virtual visit with me next week.   Patient expressed understanding and was in agreement with this plan. She also understands that She can call clinic at any time with any questions, concerns, or complaints.   Thank you for allowing me to participate in the care of this very pleasant patient.   Time Total: 15  minutes  Visit consisted of counseling and education dealing with the complex and emotionally intense issues of symptom management in the setting of serious illness.Greater than 50%  of this time was spent counseling and coordinating care related to the above assessment and plan.  Signed by: Altha Harm, PhD, NP-C

## 2021-02-11 NOTE — Progress Notes (Signed)
Pt presents to [facility] with continued cough. Has tried OTC's without much relief. The coughing has caused her to not get much sleep, and also causes her a great deal of pain. She is requesting something stronger for the cough.

## 2021-02-17 ENCOUNTER — Ambulatory Visit
Admission: RE | Admit: 2021-02-17 | Discharge: 2021-02-17 | Disposition: A | Discharge status: Home / Self Care | Payer: Medicare Other | Source: Ambulatory Visit | Attending: Hospice and Palliative Medicine | Admitting: Hospice and Palliative Medicine

## 2021-02-17 ENCOUNTER — Inpatient Hospital Stay (HOSPITAL_BASED_OUTPATIENT_CLINIC_OR_DEPARTMENT_OTHER): Payer: Medicare Other | Admitting: Hospice and Palliative Medicine

## 2021-02-17 ENCOUNTER — Other Ambulatory Visit: Payer: Self-pay

## 2021-02-17 ENCOUNTER — Ambulatory Visit
Admission: RE | Admit: 2021-02-17 | Discharge: 2021-02-17 | Disposition: A | Discharge status: Home / Self Care | Payer: Medicare Other | Attending: Hospice and Palliative Medicine | Admitting: Hospice and Palliative Medicine

## 2021-02-17 DIAGNOSIS — R059 Cough, unspecified: Secondary | ICD-10-CM | POA: Insufficient documentation

## 2021-02-17 DIAGNOSIS — C221 Intrahepatic bile duct carcinoma: Secondary | ICD-10-CM

## 2021-02-17 NOTE — Progress Notes (Signed)
Virtual Visit via Telephone Note  I connected with Sylvia Miller on 02/17/21 at 10:30 AM EST by telephone and verified that I am speaking with the correct person using two identifiers.  Location: Patient: Home Provider: Clinic   I discussed the limitations, risks, security and privacy concerns of performing an evaluation and management service by telephone and the availability of in person appointments. I also discussed with the patient that there may be a patient responsible charge related to this service. The patient expressed understanding and agreed to proceed.   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  Patient is on systemic chemotherapy with single agent gemcitabine, given concern for possible intolerance of a more aggressive chemotherapy regimen.   Observations/Objective: Patient was seen by me on 01/31/2021 for URI symptoms with rhinorrhea/congestion/cough.  She was treated symptomatically.  I saw her again for follow-up 02/11/2021 for complaint of persistent cough and postnasal drip without significant improvement on Mucinex DM.  Patient requested cough medication and was started on benzonatate and recommended to take Flonase daily.  I spoke with patient and husband today for follow-up.  They report the cough is no better.  She denies shortness of breath, wheezing, or fever or chills.  No other new symptoms or concerns.  Assessment and Plan: Cough -likely post viral but will obtain a CXR given duration of symptoms.  Can consider hydrocodone based cough syrup if needed.  Follow Up Instructions: Patient to see Rulon Abide, NP on 02/21/2021 for follow-up   I discussed the assessment and treatment plan with the patient. The patient was provided an opportunity to ask  questions and all were answered. The patient agreed with the plan and demonstrated an understanding of the instructions.   The patient was advised to call back or seek an in-person evaluation if the symptoms worsen or if the condition fails to improve as anticipated.  I provided 10 minutes of non-face-to-face time during this encounter.   [name], NP

## 2021-02-18 ENCOUNTER — Telehealth: Payer: Self-pay | Admitting: Hospice and Palliative Medicine

## 2021-02-18 MED ORDER — HYDROCOD POLST-CPM POLST ER 10-8 MG/5ML PO SUER: 2.5000 mL | Freq: Two times a day (BID) | ORAL | 0 refills | Status: DC | PRN

## 2021-02-18 NOTE — Telephone Encounter (Signed)
I spoke with patient's husband. CXR showed minimal L. Basilar atelectasis but no other acute findings. Discussed deep breathing and recommended Mucinex. Husband would like something stronger for cough as this is keeping patient awake at night. He says she has taken hydrocodone before and tolerated it well. Will rx Tussionex.  ER triggers discussed.

## 2021-02-21 ENCOUNTER — Inpatient Hospital Stay: Payer: Medicare Other

## 2021-02-21 ENCOUNTER — Inpatient Hospital Stay (HOSPITAL_BASED_OUTPATIENT_CLINIC_OR_DEPARTMENT_OTHER): Payer: Medicare Other | Admitting: Oncology

## 2021-02-21 ENCOUNTER — Other Ambulatory Visit: Payer: Self-pay

## 2021-02-21 ENCOUNTER — Encounter: Payer: Self-pay | Admitting: Oncology

## 2021-02-21 VITALS — BP 167/82 | HR 80 | Temp 97.6°F | Wt 134.4 lb

## 2021-02-21 VITALS — BP 161/59 | HR 67 | Resp 20

## 2021-02-21 DIAGNOSIS — R059 Cough, unspecified: Secondary | ICD-10-CM | POA: Diagnosis not present

## 2021-02-21 DIAGNOSIS — Z5111 Encounter for antineoplastic chemotherapy: Secondary | ICD-10-CM | POA: Diagnosis not present

## 2021-02-21 DIAGNOSIS — C221 Intrahepatic bile duct carcinoma: Secondary | ICD-10-CM | POA: Diagnosis not present

## 2021-02-21 LAB — CBC WITH DIFFERENTIAL/PLATELET
Abs Immature Granulocytes: 1.38 10*3/uL — ABNORMAL HIGH (ref 0.00–0.07)
Basophils Absolute: 0.1 10*3/uL (ref 0.0–0.1)
Basophils Relative: 0 %
Eosinophils Absolute: 0 10*3/uL (ref 0.0–0.5)
Eosinophils Relative: 0 %
HCT: 30 % — ABNORMAL LOW (ref 36.0–46.0)
Hemoglobin: 9.4 g/dL — ABNORMAL LOW (ref 12.0–15.0)
Immature Granulocytes: 5 %
Lymphocytes Relative: 8 %
Lymphs Abs: 2.3 10*3/uL (ref 0.7–4.0)
MCH: 32 pg (ref 26.0–34.0)
MCHC: 31.3 g/dL (ref 30.0–36.0)
MCV: 102 fL — ABNORMAL HIGH (ref 80.0–100.0)
Monocytes Absolute: 1.2 10*3/uL — ABNORMAL HIGH (ref 0.1–1.0)
Monocytes Relative: 4 %
Neutro Abs: 22.6 10*3/uL — ABNORMAL HIGH (ref 1.7–7.7)
Neutrophils Relative %: 83 %
Platelets: 146 10*3/uL — ABNORMAL LOW (ref 150–400)
RBC: 2.94 MIL/uL — ABNORMAL LOW (ref 3.87–5.11)
RDW: 17.5 % — ABNORMAL HIGH (ref 11.5–15.5)
WBC: 27.7 10*3/uL — ABNORMAL HIGH (ref 4.0–10.5)
nRBC: 0.1 % (ref 0.0–0.2)

## 2021-02-21 LAB — COMPREHENSIVE METABOLIC PANEL
ALT: 18 U/L (ref 0–44)
AST: 26 U/L (ref 15–41)
Albumin: 3.4 g/dL — ABNORMAL LOW (ref 3.5–5.0)
Alkaline Phosphatase: 142 U/L — ABNORMAL HIGH (ref 38–126)
Anion gap: 10 (ref 5–15)
BUN: 23 mg/dL (ref 8–23)
CO2: 28 mmol/L (ref 22–32)
Calcium: 8.7 mg/dL — ABNORMAL LOW (ref 8.9–10.3)
Chloride: 99 mmol/L (ref 98–111)
Creatinine, Ser: 0.85 mg/dL (ref 0.44–1.00)
GFR, Estimated: >60 mL/min (ref >60)
Glucose, Bld: 77 mg/dL (ref 70–99)
Potassium: 3.5 mmol/L (ref 3.5–5.1)
Sodium: 137 mmol/L (ref 135–145)
Total Bilirubin: 0.7 mg/dL (ref 0.3–1.2)
Total Protein: 6.4 g/dL — ABNORMAL LOW (ref 6.5–8.1)

## 2021-02-21 MED ORDER — SODIUM CHLORIDE 0.9 % IV SOLN
10.0000 mg | Freq: Once | INTRAVENOUS | Status: AC
  Administered 2021-02-21: 12:00:00 10 mg via INTRAVENOUS
  Filled 2021-02-21: qty 10

## 2021-02-21 MED ORDER — SODIUM CHLORIDE 0.9 % IV SOLN
800.0000 mg/m2 | Freq: Once | INTRAVENOUS | Status: AC
  Administered 2021-02-21: 13:00:00 1254 mg via INTRAVENOUS
  Filled 2021-02-21: qty 26.3

## 2021-02-21 MED ORDER — BENZONATATE 100 MG PO CAPS: 100.0000 mg | ORAL_CAPSULE | Freq: Three times a day (TID) | ORAL | 0 refills | Status: DC | PRN

## 2021-02-21 MED ORDER — SODIUM CHLORIDE 0.9% FLUSH
10.0000 mL | Freq: Once | INTRAVENOUS | Status: AC
  Administered 2021-02-21: 11:00:00 10 mL via INTRAVENOUS
  Filled 2021-02-21: qty 10

## 2021-02-21 MED ORDER — HYDROCOD POLST-CPM POLST ER 10-8 MG/5ML PO SUER: 5.0000 mL | Freq: Two times a day (BID) | ORAL | 0 refills | Status: DC | PRN

## 2021-02-21 MED ORDER — HEPARIN SOD (PORK) LOCK FLUSH 100 UNIT/ML IV SOLN
500.0000 [IU] | Freq: Once | INTRAVENOUS | Status: AC | PRN
  Filled 2021-02-21: qty 5

## 2021-02-21 MED ORDER — SODIUM CHLORIDE 0.9 % IV SOLN
Freq: Once | INTRAVENOUS | Status: AC
  Filled 2021-02-21: qty 250

## 2021-02-21 MED ORDER — HEPARIN SOD (PORK) LOCK FLUSH 100 UNIT/ML IV SOLN
INTRAVENOUS | Status: AC
  Administered 2021-02-21: 14:00:00 500 [IU]
  Filled 2021-02-21: qty 5

## 2021-02-21 NOTE — Progress Notes (Signed)
Pt was not able to pick up her cough meds, the pharm states there is a Event organiser. Would like print out of meds.

## 2021-02-21 NOTE — Progress Notes (Signed)
Hematology/Oncology Consult note [facility]  [phone] [phone]  Patient Care Team: Dion Body, MD as PCP - General (Family Medicine) Clent Jacks, RN as Oncology Nurse Navigator   Name of the patient: Sylvia Miller  [id number]  1940-11-04   Date of visit: 02/21/21  Diagnosis-locally advanced cholangiocarcinoma  Chief complaint/ Reason for visit-on treatment assessment prior to cycle 2 day 1 of gemcitabine chemotherapy  Heme/Onc history: patient is a 80 year old female who was [facility] by [facility] forSymptoms of epigastric pain and weight loss.  She had a CT scan of the abdomen which showed 12.2 cm liver lesion as compared to 6.8 cm lesion in 2021 and 2.2 cm lesion in 2019.  This was followed by a repeat CT scan in September 2022 which showed further enlargement of the mass to 13.7 x 9.2 cm which favors primary pancreaticobiliary versus hepatic malignancy.  Moderate periportal adenopathy.  Patient had an ultrasound-guided biopsy of the level which was consistent with adenocarcinoma.  Cells positive for CK7 and CK19 and negative for CDX2 GATA3 PAX8 and TTF-1.  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  PET CT scan showed dominant left hepatic lobe mass measuring 14.6 x 10.2 cm with an SUV of 11.4.  Porta hepatis nodes 2.2 cm with an SUV of 4.7 and adjacent gastrohepatic ligament lymph node 1.3 cm with an SUV of 4.7.   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  Tumor mutational burden not high.  PD-L1 less than 1%.  Microsatellite stable   Interval history-patient presents today with her daughter.  Reports tolerating cycle 1 well.  Denies any nausea or vomiting.  Has some stable chronic fatigue and intermittent abdominal pain.  Daughter states she is eating and drinking well.  Developed a cough and was evaluated in her symptom  management clinic last week.  Had a chest x-ray which was negative and given a prescription for cough medicine.  Was unable to get prescription filled due to drug being on backorder.  She is wondering if we can send to a different pharmacy.  Patient states the cough is stable and not worsening.  No fevers.  Taking Mucinex.   ECOG PS- 2 Pain scale- 0 Opioid associated constipation- no  Review of systems- Review of Systems  Constitutional:  Positive for malaise/fatigue.  Respiratory:  Positive for cough.   Gastrointestinal:  Positive for abdominal pain.  Neurological:  Positive for weakness.      Allergies  Allergen Reactions   Pollen Extract Other (See Comments)    Nose runs, eyes itch, etc     Past Medical History:  Diagnosis Date   Allergy    Anemia    Arthritis    rheumatoid /knees/hands/shoulders (Right shoulder) infusion every 5 weeks   Bladder incontinence    Chicken pox    Dyspnea    with exertion or far walking   Gastric ulcer    GERD (gastroesophageal reflux disease)    Heart murmur    Hypercholesteremia    Hyperlipidemia, unspecified    Hypertension    controlled with meds;    MDS (myelodysplastic syndrome) ([place]) 2000   Memory deficit    Myelodysplastic syndrome (HCC)    Rheumatoid arthritis ([place])    Ulcer      Past Surgical History:  Procedure Laterality Date   ABDOMINAL HYSTERECTOMY     APPENDECTOMY     as a child  CHOLECYSTECTOMY     COLONOSCOPY     COLONOSCOPY WITH PROPOFOL N/A 04/24/2017   Procedure: COLONOSCOPY WITH PROPOFOL;  Surgeon: Lollie Sails, MD;  Location: [facility] ENDOSCOPY;  Service: Endoscopy;  Laterality: N/A;   ESOPHAGOGASTRODUODENOSCOPY (EGD) WITH PROPOFOL N/A 04/24/2017   Procedure: ESOPHAGOGASTRODUODENOSCOPY (EGD) WITH PROPOFOL;  Surgeon: Lollie Sails, MD;  Location: [facility] ENDOSCOPY;  Service: Endoscopy;  Laterality: N/A;   JOINT REPLACEMENT     KNEE ARTHROPLASTY Right 02/21/2017   Procedure: COMPUTER ASSISTED TOTAL KNEE  ARTHROPLASTY;  Surgeon: Dereck Leep, MD;  Location: [facility] ORS;  Service: Orthopedics;  Laterality: Right;   KNEE ARTHROPLASTY Left 08/06/2017   Procedure: COMPUTER ASSISTED TOTAL KNEE ARTHROPLASTY;  Surgeon: Dereck Leep, MD;  Location: [facility] ORS;  Service: Orthopedics;  Laterality: Left;   NM INTRAVENOUS INJECTION ([facility] HX)     receives biologic infusions every 5 weeks for rheumatoid arthritis. followed by dr. Meda Coffee at [facility] 01/17/2021   Procedure: PORTA CATH INSERTION;  Surgeon: Algernon Huxley, MD;  Location: [facility] CV LAB;  Service: Cardiovascular;  Laterality: N/A;   TONSILLECTOMY     UPPER GASTROINTESTINAL ENDOSCOPY     WISDOM TOOTH EXTRACTION      Social History   Socioeconomic History   Marital status: Married    Spouse name: Not on file   Number of children: 2   Years of education: 14   Highest education level: Not on file  Occupational History   Not on file  Tobacco Use   Smoking status: Never   Smokeless tobacco: Never  Vaping Use   Vaping Use: Never used  Substance and Sexual Activity   Alcohol use: Yes    Comment: very occasional   Drug use: Never   Sexual activity: Yes  Other Topics Concern   Not on file  Social History Narrative   Not on file   Social Determinants of Health   Financial Resource Strain: Not on file  Food Insecurity: Not on file  Transportation Needs: Not on file  Physical Activity: Not on file  Stress: Not on file  Social Connections: Not on file  Intimate Partner Violence: Not on file    Family History  Problem Relation Age of Onset   Cancer Mother    Lung cancer Mother    Lung cancer Father    Colon cancer Neg Hx    Colon polyps Neg Hx    Rectal cancer Neg Hx      Current Outpatient Medications:    acetaminophen (TYLENOL) 325 MG tablet, Take 650 mg by mouth every 6 (six) hours as needed for moderate pain or headache. , Disp: , Rfl:    Artificial Tear Solution (GENTEAL TEARS)  0.1-0.2-0.3 % SOLN, Place 1 drop into both eyes daily as needed (dry eye). , Disp: , Rfl:    aspirin EC 81 MG tablet, Take 81 mg by mouth daily., Disp: , Rfl:    atorvastatin (LIPITOR) 10 MG tablet, Take 5 mg by mouth daily. In the morning, Disp: , Rfl:    dexamethasone (DECADRON) 4 MG tablet, Take 2 tablets (8 mg total) by mouth daily. Take daily x 3 days starting the day after cisplatin chemotherapy. Take with food., Disp: 30 tablet, Rfl: 1   diclofenac sodium (VOLTAREN) 1 % GEL, Apply 2 g topically 4 (four) times daily as needed (pain). Apply to painful joint, Disp: , Rfl:    donepezil (ARICEPT) 10 MG tablet, Take 10 mg by  mouth at bedtime., Disp: , Rfl:    ferrous sulfate 325 (65 FE) MG tablet, Take 325 mg by mouth daily with breakfast., Disp: , Rfl:    fluticasone (FLONASE) 50 MCG/ACT nasal spray, Place into both nostrils daily., Disp: , Rfl:    hydrochlorothiazide (HYDRODIURIL) 12.5 MG tablet, Take 12.5 mg by mouth daily., Disp: , Rfl:    lidocaine-prilocaine (EMLA) cream, Apply to affected area once, Disp: 30 g, Rfl: 3   lisinopril (ZESTRIL) 20 MG tablet, Take 20 mg by mouth daily. For BP., Disp: , Rfl:    loperamide (IMODIUM) 2 MG capsule, Take 1 capsule (2 mg total) by mouth as needed for diarrhea or loose stools., Disp: 90 capsule, Rfl: 1   LORazepam (ATIVAN) 0.5 MG tablet, Take 0.5 mg by mouth once., Disp: , Rfl:    meloxicam (MOBIC) 7.5 MG tablet, Take 7.5 mg by mouth daily. For muscle pains., Disp: , Rfl:    memantine (NAMENDA) 10 MG tablet, Take 10 mg by mouth 2 (two) times daily., Disp: , Rfl:    Menthol, Topical Analgesic, (BIOFREEZE EX), Apply 1 application topically as needed (for knee/leg pain)., Disp: , Rfl:    mirtazapine (REMERON) 7.5 MG tablet, Take 7.5 mg by mouth at bedtime., Disp: , Rfl:    Multiple Vitamins-Minerals (OCUVITE PO), Take 0.5 tablets by mouth in the morning and at bedtime., Disp: , Rfl:    ondansetron (ZOFRAN) 8 MG tablet, Take 1 tablet (8 mg total) by mouth  2 (two) times daily as needed. Start on the third day after cisplatin chemotherapy., Disp: 30 tablet, Rfl: 1   oxybutynin (DITROPAN-XL) 10 MG 24 hr tablet, Take 10 mg by mouth at bedtime. Urinary Frequency., Disp: , Rfl:    oxyCODONE (OXY IR/ROXICODONE) 5 MG immediate release tablet, Take 1-2 tablets (5-10 mg total) by mouth every 6 (six) hours as needed for severe pain., Disp: 90 tablet, Rfl: 0   pantoprazole (PROTONIX) 40 MG tablet, Take 1 tablet (40 mg total) by mouth daily., Disp: 60 tablet, Rfl: 3   potassium chloride SA (KLOR-CON M) 20 MEQ tablet, TAKE 1 TABLET BY MOUTH TWICE DAILY, Disp: 14 tablet, Rfl: 0   prochlorperazine (COMPAZINE) 10 MG tablet, Take 1 tablet (10 mg total) by mouth every 6 (six) hours as needed (Nausea or vomiting)., Disp: 30 tablet, Rfl: 1   sulfaSALAzine (AZULFIDINE) 500 MG tablet, Take 500 mg by mouth 2 (two) times daily. Anti-Inflammatory for 90 days., Disp: , Rfl:    triamcinolone cream (KENALOG) 0.5 %, Apply topically., Disp: , Rfl:    benzonatate (TESSALON) 100 MG capsule, Take 1-2 capsules (100-200 mg total) by mouth 3 (three) times daily as needed for cough. (Patient not taking: Reported on 02/21/2021), Disp: 30 capsule, Rfl: 0   chlorpheniramine-HYDROcodone (TUSSIONEX) 10-8 MG/5ML SUER, Take 2.5-5 mLs by mouth every 12 (twelve) hours as needed for cough. (Patient not taking: Reported on 02/21/2021), Disp: 140 mL, Rfl: 0 No current facility-administered medications for this visit.  Facility-Administered Medications Ordered in Other Visits:    dexamethasone (DECADRON) 10 mg in sodium chloride 0.9 % 50 mL IVPB, 10 mg, Intravenous, Once, Sindy Guadeloupe, MD   gemcitabine (GEMZAR) 1,254 mg in sodium chloride 0.9 % 250 mL chemo infusion, 800 mg/m2 (Order-Specific), Intravenous, Once, Sindy Guadeloupe, MD   heparin lock flush 100 unit/mL, 500 Units, Intracatheter, Once PRN, Sindy Guadeloupe, MD  Physical exam:  Vitals:   02/21/21 1057  BP: (!) 167/82  Pulse: 80  Temp:  97.6 F (  36.4 C)  TempSrc: Tympanic  SpO2: 99%  Weight: 134 lb 6.4 oz (61 kg)   Physical Exam Constitutional:      Appearance: Normal appearance.  HENT:     Head: Normocephalic and atraumatic.  Eyes:     Pupils: Pupils are equal, round, and reactive to light.  Cardiovascular:     Rate and Rhythm: Normal rate and regular rhythm.     Heart sounds: Normal heart sounds. No murmur heard. Pulmonary:     Effort: Pulmonary effort is normal.     Breath sounds: Normal breath sounds. No wheezing.  Abdominal:     General: Bowel sounds are normal. There is no distension.     Palpations: Abdomen is soft.     Tenderness: There is no abdominal tenderness.  Musculoskeletal:        General: Normal range of motion.     Cervical back: Normal range of motion.  Skin:    General: Skin is warm and dry.     Findings: No rash.  Neurological:     Mental Status: She is alert and oriented to person, place, and time.  Psychiatric:        Judgment: Judgment normal.     CMP Latest Ref Rng & Units 02/21/2021  Glucose 70 - 99 mg/dL 77  BUN 8 - 23 mg/dL 23  Creatinine 0.44 - 1.00 mg/dL 0.85  Sodium 135 - 145 mmol/L 137  Potassium 3.5 - 5.1 mmol/L 3.5  Chloride 98 - 111 mmol/L 99  CO2 22 - 32 mmol/L 28  Calcium 8.9 - 10.3 mg/dL 8.7(L)  Total Protein 6.5 - 8.1 g/dL 6.4(L)  Total Bilirubin 0.3 - 1.2 mg/dL 0.7  Alkaline Phos 38 - 126 U/L 142(H)  AST 15 - 41 U/L 26  ALT 0 - 44 U/L 18   CBC Latest Ref Rng & Units 02/21/2021  WBC 4.0 - 10.5 K/uL 27.7(H)  Hemoglobin 12.0 - 15.0 g/dL 9.4(L)  Hematocrit 36.0 - 46.0 % 30.0(L)  Platelets 150 - 400 K/uL 146(L)    No images are attached to the encounter.  DG Chest 2 View  Result Date: 02/18/2021 CLINICAL DATA:  Persistent cough, history of cholangiocarcinoma. EXAM: CHEST - 2 VIEW COMPARISON:  Jul 06, 2020. FINDINGS: Stable cardiomediastinal silhouette. Interval placement of right internal jugular Port-A-Cath with distal tip in expected position of the  SVC. No pneumothorax is noted. Right lung is clear. Minimal left basilar subsegmental atelectasis is noted. Bony thorax is unremarkable. IMPRESSION: Minimal left basilar subsegmental atelectasis. Electronically Signed   By: Marijo Conception M.D.   On: 02/18/2021 15:37     Assessment and plan- Patient is a 80 y.o. 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  She is here for on treatment assessment prior to cycle 2 day 1 of palliative gemcitabine chemotherapy  Labs are acceptable for cycle 2-day 1 of gemcitabine chemotherapy with Udenyca support.  Proceed with treatment.  Cough-likely viral.  Chest x-ray from last week was negative.  No fevers.  We will resend prescription for Tussionex to a different pharmacy.  Anemia-stable.  Slightly worse due to chemo.  Continue to monitor.  Thrombocytopenia-platelet count 146.  Monitor for now.  Leukocytosis-secondary to [employer].  No fevers.  Recent chest x-ray negative for signs of infection.  Continue to monitor.  Disposition- Proceed with cycle 2-day 1 today.  She is already scheduled for cycle 2-day 8 next week and will see Dr. [name] back on 03/14/2021. New prescription sent  for testing next to [facility].  Daughter updated.  Visit Diagnosis 1. Primary cholangiocarcinoma of intrahepatic bile duct ([place])   2. Cough, unspecified type    Faythe Casa, NP 02/21/2021 12:04 PM

## 2021-02-21 NOTE — Patient Instructions (Signed)
[facility] CANCER CTR AT [place]   Discharge Instructions: Thank you for choosing [facility] to provide your oncology and hematology care.  If you have a lab appointment with the [facility], please go directly to the [facility] and check in at the registration area.   Wear comfortable clothing and clothing appropriate for easy access to any Portacath or PICC line.   We strive to give you quality time with your provider. You may need to reschedule your appointment if you arrive late (15 or more minutes).  Arriving late affects you and other patients whose appointments are after yours.  Also, if you miss three or more appointments without notifying the office, you may be dismissed from the clinic at the providers discretion.      For prescription refill requests, have your pharmacy contact our office and allow 72 hours for refills to be completed.    Today you received the following chemotherapy and/or immunotherapy agents: Gemzar.      To help prevent nausea and vomiting after your treatment, we encourage you to take your nausea medication as directed.  BELOW ARE SYMPTOMS THAT SHOULD BE REPORTED IMMEDIATELY: *FEVER GREATER THAN 100.4 F (38 C) OR HIGHER *CHILLS OR SWEATING *NAUSEA AND VOMITING THAT IS NOT CONTROLLED WITH YOUR NAUSEA MEDICATION *UNUSUAL SHORTNESS OF BREATH *UNUSUAL BRUISING OR BLEEDING *URINARY PROBLEMS (pain or burning when urinating, or frequent urination) *BOWEL PROBLEMS (unusual diarrhea, constipation, pain near the anus) TENDERNESS IN MOUTH AND THROAT WITH OR WITHOUT PRESENCE OF ULCERS (sore throat, sores in mouth, or a toothache) UNUSUAL RASH, SWELLING OR PAIN  UNUSUAL VAGINAL DISCHARGE OR ITCHING   Items with * indicate a potential emergency and should be followed up as soon as possible or go to the Emergency Department if any problems should occur.  Please show the CHEMOTHERAPY ALERT CARD or IMMUNOTHERAPY ALERT CARD at check-in to  the Emergency Department and triage nurse.  Should you have questions after your visit or need to cancel or reschedule your appointment, please contact [facility] AT [place]  Dept: [phone]  and follow the prompts.  Office hours are 8:00 a.m. to 4:30 p.m. Monday - Friday. Please note that voicemails left after 4:00 p.m. may not be returned until the following business day.  We are closed weekends and major holidays. You have access to a nurse at all times for urgent questions. Please call the main number to the clinic Dept: [phone] and follow the prompts.  For any non-urgent questions, you may also contact your provider using MyChart. We now offer e-Visits for anyone 75 and older to request care online for non-urgent symptoms. For details visit [email or web address].   Also download the MyChart app! Go to the app store, search "MyChart", open the app, select [facility], and log in with your MyChart username and password.  Due to Covid, a mask is required upon entering the hospital/clinic. If you do not have a mask, one will be given to you upon arrival. For doctor visits, patients may have 1 support person aged 68 or older with them. For treatment visits, patients cannot have anyone with them due to current Covid guidelines and our immunocompromised population.

## 2021-02-22 LAB — CANCER ANTIGEN 19-9: CA 19-9: 1992 U/mL — ABNORMAL HIGH (ref 0–35)

## 2021-02-24 ENCOUNTER — Inpatient Hospital Stay (HOSPITAL_BASED_OUTPATIENT_CLINIC_OR_DEPARTMENT_OTHER): Payer: Medicare Other | Admitting: Hospice and Palliative Medicine

## 2021-02-24 DIAGNOSIS — C221 Intrahepatic bile duct carcinoma: Secondary | ICD-10-CM

## 2021-02-24 NOTE — Progress Notes (Signed)
I did not reach patient for scheduled telephone visit.  She did see Rulon Abide, NP, this week and appeared to be doing reasonably well.  Tussionex prescription was sent to a different pharmacy per patient/family request.  Patient has upcoming follow-up with Dr. [name].  Will reschedule my virtual visit.

## 2021-03-01 ENCOUNTER — Other Ambulatory Visit: Payer: Self-pay

## 2021-03-01 ENCOUNTER — Telehealth: Payer: Self-pay | Admitting: *Deleted

## 2021-03-01 ENCOUNTER — Inpatient Hospital Stay: Payer: Medicare Other

## 2021-03-01 VITALS — BP 122/62 | HR 76 | Resp 18

## 2021-03-01 DIAGNOSIS — C221 Intrahepatic bile duct carcinoma: Secondary | ICD-10-CM

## 2021-03-01 DIAGNOSIS — Z5111 Encounter for antineoplastic chemotherapy: Secondary | ICD-10-CM | POA: Diagnosis not present

## 2021-03-01 DIAGNOSIS — E860 Dehydration: Secondary | ICD-10-CM

## 2021-03-01 LAB — CBC WITH DIFFERENTIAL/PLATELET
Abs Immature Granulocytes: 0.19 10*3/uL — ABNORMAL HIGH (ref 0.00–0.07)
Basophils Absolute: 0.1 10*3/uL (ref 0.0–0.1)
Basophils Relative: 0 %
Eosinophils Absolute: 0 10*3/uL (ref 0.0–0.5)
Eosinophils Relative: 0 %
HCT: 28 % — ABNORMAL LOW (ref 36.0–46.0)
Hemoglobin: 8.8 g/dL — ABNORMAL LOW (ref 12.0–15.0)
Immature Granulocytes: 1 %
Lymphocytes Relative: 2 %
Lymphs Abs: 0.4 10*3/uL — ABNORMAL LOW (ref 0.7–4.0)
MCH: 32.8 pg (ref 26.0–34.0)
MCHC: 31.4 g/dL (ref 30.0–36.0)
MCV: 104.5 fL — ABNORMAL HIGH (ref 80.0–100.0)
Monocytes Absolute: 0.5 10*3/uL (ref 0.1–1.0)
Monocytes Relative: 3 %
Neutro Abs: 16.2 10*3/uL — ABNORMAL HIGH (ref 1.7–7.7)
Neutrophils Relative %: 94 %
Platelets: 111 10*3/uL — ABNORMAL LOW (ref 150–400)
RBC: 2.68 MIL/uL — ABNORMAL LOW (ref 3.87–5.11)
RDW: 18 % — ABNORMAL HIGH (ref 11.5–15.5)
WBC: 17.3 10*3/uL — ABNORMAL HIGH (ref 4.0–10.5)
nRBC: 0 % (ref 0.0–0.2)

## 2021-03-01 LAB — COMPREHENSIVE METABOLIC PANEL
ALT: 27 U/L (ref 0–44)
AST: 33 U/L (ref 15–41)
Albumin: 3.3 g/dL — ABNORMAL LOW (ref 3.5–5.0)
Alkaline Phosphatase: 148 U/L — ABNORMAL HIGH (ref 38–126)
Anion gap: 15 (ref 5–15)
BUN: 31 mg/dL — ABNORMAL HIGH (ref 8–23)
CO2: 22 mmol/L (ref 22–32)
Calcium: 8.6 mg/dL — ABNORMAL LOW (ref 8.9–10.3)
Chloride: 100 mmol/L (ref 98–111)
Creatinine, Ser: 1.75 mg/dL — ABNORMAL HIGH (ref 0.44–1.00)
GFR, Estimated: 29 mL/min — ABNORMAL LOW (ref >60)
Glucose, Bld: 127 mg/dL — ABNORMAL HIGH (ref 70–99)
Potassium: 3.6 mmol/L (ref 3.5–5.1)
Sodium: 137 mmol/L (ref 135–145)
Total Bilirubin: 0.9 mg/dL (ref 0.3–1.2)
Total Protein: 7 g/dL (ref 6.5–8.1)

## 2021-03-01 MED ORDER — SODIUM CHLORIDE 0.9 % IV SOLN
Freq: Once | INTRAVENOUS | Status: AC
  Administered 2021-03-01: 14:00:00 1000 mL via INTRAVENOUS
  Filled 2021-03-01: qty 250

## 2021-03-01 MED ORDER — HEPARIN SOD (PORK) LOCK FLUSH 100 UNIT/ML IV SOLN
500.0000 [IU] | Freq: Once | INTRAVENOUS | Status: AC
  Administered 2021-03-01: 16:00:00 500 [IU] via INTRAVENOUS
  Filled 2021-03-01: qty 5

## 2021-03-01 MED ORDER — SODIUM CHLORIDE 0.9% FLUSH
10.0000 mL | INTRAVENOUS | Status: DC | PRN
  Administered 2021-03-01: 14:00:00 10 mL via INTRAVENOUS
  Filled 2021-03-01: qty 10

## 2021-03-01 MED ORDER — HEPARIN SOD (PORK) LOCK FLUSH 100 UNIT/ML IV SOLN
INTRAVENOUS | Status: AC
  Filled 2021-03-01: qty 5

## 2021-03-01 NOTE — Telephone Encounter (Signed)
Family member said that pt has not been good since last treatment. Per family she basically stayed in the bed most of the time . They think she may had drank 3 glasses of water/tea all week. She  was helped to get bath. Also had diarrhea through the week of christmas. Today the family member said that she got up herself , went to bathroom and got dressed. Jennie reviewed her labs and said that pt is dehydrated per labs. Cancelled chemo today and got  IVF and b/p was low. Patrici Ranks said to give 1 liter of fluids over 1 hour and see how she feels after that. Family notiifed of this

## 2021-03-01 NOTE — Patient Instructions (Signed)
[facility] CANCER CTR AT [place]   Discharge Instructions: Thank you for choosing [facility] to provide your oncology and hematology care.  If you have a lab appointment with the [facility], please go directly to the [facility] and check in at the registration area.   Wear comfortable clothing and clothing appropriate for easy access to any Portacath or PICC line.   We strive to give you quality time with your provider. You may need to reschedule your appointment if you arrive late (15 or more minutes).  Arriving late affects you and other patients whose appointments are after yours.  Also, if you miss three or more appointments without notifying the office, you may be dismissed from the clinic at the providers discretion.      For prescription refill requests, have your pharmacy contact our office and allow 72 hours for refills to be completed.    Today you received the following: 0.9% Sodium Chloride Infusion.      To help prevent nausea and vomiting after your treatment, we encourage you to take your nausea medication as directed.  BELOW ARE SYMPTOMS THAT SHOULD BE REPORTED IMMEDIATELY: *FEVER GREATER THAN 100.4 F (38 C) OR HIGHER *CHILLS OR SWEATING *NAUSEA AND VOMITING THAT IS NOT CONTROLLED WITH YOUR NAUSEA MEDICATION *UNUSUAL SHORTNESS OF BREATH *UNUSUAL BRUISING OR BLEEDING *URINARY PROBLEMS (pain or burning when urinating, or frequent urination) *BOWEL PROBLEMS (unusual diarrhea, constipation, pain near the anus) TENDERNESS IN MOUTH AND THROAT WITH OR WITHOUT PRESENCE OF ULCERS (sore throat, sores in mouth, or a toothache) UNUSUAL RASH, SWELLING OR PAIN  UNUSUAL VAGINAL DISCHARGE OR ITCHING   Items with * indicate a potential emergency and should be followed up as soon as possible or go to the Emergency Department if any problems should occur.  Please show the CHEMOTHERAPY ALERT CARD or IMMUNOTHERAPY ALERT CARD at check-in to the Emergency  Department and triage nurse.  Should you have questions after your visit or need to cancel or reschedule your appointment, please contact [facility] AT [place]  Dept: [phone]  and follow the prompts.  Office hours are 8:00 a.m. to 4:30 p.m. Monday - Friday. Please note that voicemails left after 4:00 p.m. may not be returned until the following business day.  We are closed weekends and major holidays. You have access to a nurse at all times for urgent questions. Please call the main number to the clinic Dept: [phone] and follow the prompts.  For any non-urgent questions, you may also contact your provider using MyChart. We now offer e-Visits for anyone 65 and older to request care online for non-urgent symptoms. For details visit [email or web address].   Also download the MyChart app! Go to the app store, search "MyChart", open the app, select [place] [place], and log in with your MyChart username and password.  Due to Covid, a mask is required upon entering the hospital/clinic. If you do not have a mask, one will be given to you upon arrival. For doctor visits, patients may have 1 support person aged 3 or older with them. For treatment visits, patients cannot have anyone with them due to current Covid guidelines and our immunocompromised population.

## 2021-03-01 NOTE — Progress Notes (Signed)
Met patient/family member in waiting room prior to port lab draw. Family member reports patient had a terrible week - in bed most of the week, "explosive" diarrhea, very little PO intake. She also had at least one fall. Burna Sis RN speaking with patient's family member in waiting room and states J.Burns NP to see patient today in infusion. Patient appears fatigued, lethargic. Reports dyspnea with transfer to infusion chair. VS obtained BP (!) 98/58 (BP Location: Right Arm, Patient Position: Sitting)    Pulse 97    Temp (!) 97.2 F (36.2 C) (Tympanic)    Resp 16    SpO2 98% Reported to A.Wilson Therapist, sports.

## 2021-03-01 NOTE — Progress Notes (Signed)
1344- Patient reports increased fatigue and not feeling well over the last week. Manual blood pressure: 98/58, Pulse rate: 97. Today's lab results reviewed. NP, Faythe Casa, notified and aware.   1355- Per NP, Faythe Casa, order: hold Gemzar treatment today; administer 1 L 0.9% Sodium Chloride at 978ml/hr once over 1 hour and recheck blood pressure post 0.9% Sodium Chloride infusion. Patient informed and verbalized understanding.  1512- Post IVF- Manual Blood Pressure: 122/62, Pulse Rate: 76. Patient reports she is still not feeling well. Staff asked patient to elaborate on what is not feeling well. Patient states, "I just don't feel like I have any energy. I don't have the energy to do any work or anything. I am just exhausted." NP, Faythe Casa, notified and aware.  1532- Per NP, Faythe Casa, order: patient may be discharged to home at this time, patient encouraged to increase fluid intake at home, and patient encouraged to call clinic if symptoms persist or worsen over the next few days. Patient, patient's spouse, and patient's daughter informed and verbalized understanding. Updated appointments for next week scheduled and print out provided for patient. Patient discharged to home.

## 2021-03-02 ENCOUNTER — Inpatient Hospital Stay: Payer: Medicare Other

## 2021-03-02 ENCOUNTER — Ambulatory Visit: Payer: Medicare Other

## 2021-03-11 ENCOUNTER — Inpatient Hospital Stay: Payer: Medicare Other

## 2021-03-11 ENCOUNTER — Inpatient Hospital Stay (HOSPITAL_BASED_OUTPATIENT_CLINIC_OR_DEPARTMENT_OTHER): Payer: Medicare Other | Admitting: Oncology

## 2021-03-11 ENCOUNTER — Encounter: Payer: Self-pay | Admitting: Oncology

## 2021-03-11 ENCOUNTER — Other Ambulatory Visit: Payer: Self-pay

## 2021-03-11 ENCOUNTER — Other Ambulatory Visit: Payer: Self-pay | Admitting: *Deleted

## 2021-03-11 ENCOUNTER — Inpatient Hospital Stay: Payer: Medicare Other | Attending: Oncology

## 2021-03-11 VITALS — BP 141/85 | HR 86 | Temp 98.1°F | Resp 16 | Wt 126.0 lb

## 2021-03-11 DIAGNOSIS — E876 Hypokalemia: Secondary | ICD-10-CM | POA: Diagnosis not present

## 2021-03-11 DIAGNOSIS — Z5111 Encounter for antineoplastic chemotherapy: Secondary | ICD-10-CM

## 2021-03-11 DIAGNOSIS — Z79899 Other long term (current) drug therapy: Secondary | ICD-10-CM | POA: Diagnosis not present

## 2021-03-11 DIAGNOSIS — T451X5A Adverse effect of antineoplastic and immunosuppressive drugs, initial encounter: Secondary | ICD-10-CM | POA: Diagnosis not present

## 2021-03-11 DIAGNOSIS — N179 Acute kidney failure, unspecified: Secondary | ICD-10-CM | POA: Diagnosis not present

## 2021-03-11 DIAGNOSIS — G893 Neoplasm related pain (acute) (chronic): Secondary | ICD-10-CM | POA: Insufficient documentation

## 2021-03-11 DIAGNOSIS — R7989 Other specified abnormal findings of blood chemistry: Secondary | ICD-10-CM | POA: Diagnosis not present

## 2021-03-11 DIAGNOSIS — C221 Intrahepatic bile duct carcinoma: Secondary | ICD-10-CM | POA: Insufficient documentation

## 2021-03-11 DIAGNOSIS — Z7982 Long term (current) use of aspirin: Secondary | ICD-10-CM | POA: Diagnosis not present

## 2021-03-11 DIAGNOSIS — D6481 Anemia due to antineoplastic chemotherapy: Secondary | ICD-10-CM | POA: Insufficient documentation

## 2021-03-11 LAB — CBC WITH DIFFERENTIAL/PLATELET
Abs Immature Granulocytes: 0.2 10*3/uL — ABNORMAL HIGH (ref 0.00–0.07)
Basophils Absolute: 0 10*3/uL (ref 0.0–0.1)
Basophils Relative: 0 %
Eosinophils Absolute: 0 10*3/uL (ref 0.0–0.5)
Eosinophils Relative: 0 %
HCT: 28.3 % — ABNORMAL LOW (ref 36.0–46.0)
Hemoglobin: 8.6 g/dL — ABNORMAL LOW (ref 12.0–15.0)
Immature Granulocytes: 1 %
Lymphocytes Relative: 12 %
Lymphs Abs: 2 10*3/uL (ref 0.7–4.0)
MCH: 31.9 pg (ref 26.0–34.0)
MCHC: 30.4 g/dL (ref 30.0–36.0)
MCV: 104.8 fL — ABNORMAL HIGH (ref 80.0–100.0)
Monocytes Absolute: 1.6 10*3/uL — ABNORMAL HIGH (ref 0.1–1.0)
Monocytes Relative: 9 %
Neutro Abs: 12.9 10*3/uL — ABNORMAL HIGH (ref 1.7–7.7)
Neutrophils Relative %: 78 %
Platelets: 309 10*3/uL (ref 150–400)
RBC: 2.7 MIL/uL — ABNORMAL LOW (ref 3.87–5.11)
RDW: 18 % — ABNORMAL HIGH (ref 11.5–15.5)
WBC: 16.7 10*3/uL — ABNORMAL HIGH (ref 4.0–10.5)
nRBC: 0 % (ref 0.0–0.2)

## 2021-03-11 LAB — COMPREHENSIVE METABOLIC PANEL
ALT: 17 U/L (ref 0–44)
AST: 27 U/L (ref 15–41)
Albumin: 3.2 g/dL — ABNORMAL LOW (ref 3.5–5.0)
Alkaline Phosphatase: 120 U/L (ref 38–126)
Anion gap: 10 (ref 5–15)
BUN: 17 mg/dL (ref 8–23)
CO2: 23 mmol/L (ref 22–32)
Calcium: 8.6 mg/dL — ABNORMAL LOW (ref 8.9–10.3)
Chloride: 107 mmol/L (ref 98–111)
Creatinine, Ser: 0.96 mg/dL (ref 0.44–1.00)
GFR, Estimated: 60 mL/min — ABNORMAL LOW (ref >60)
Glucose, Bld: 105 mg/dL — ABNORMAL HIGH (ref 70–99)
Potassium: 3.3 mmol/L — ABNORMAL LOW (ref 3.5–5.1)
Sodium: 140 mmol/L (ref 135–145)
Total Bilirubin: 0.5 mg/dL (ref 0.3–1.2)
Total Protein: 6.5 g/dL (ref 6.5–8.1)

## 2021-03-11 MED ORDER — SODIUM CHLORIDE 0.9 % IV SOLN
Freq: Once | INTRAVENOUS | Status: AC
  Filled 2021-03-11: qty 250

## 2021-03-11 MED ORDER — HEPARIN SOD (PORK) LOCK FLUSH 100 UNIT/ML IV SOLN
500.0000 [IU] | Freq: Once | INTRAVENOUS | Status: AC | PRN
  Administered 2021-03-11: 500 [IU]
  Filled 2021-03-11: qty 5

## 2021-03-11 MED ORDER — SODIUM CHLORIDE 0.9 % IV SOLN
10.0000 mg | Freq: Once | INTRAVENOUS | Status: AC
  Administered 2021-03-11: 10 mg via INTRAVENOUS
  Filled 2021-03-11: qty 10

## 2021-03-11 MED ORDER — SODIUM CHLORIDE 0.9 % IV SOLN
700.0000 mg/m2 | Freq: Once | INTRAVENOUS | Status: AC
  Administered 2021-03-11: 1102 mg via INTRAVENOUS
  Filled 2021-03-11: qty 5.26

## 2021-03-11 MED ORDER — SODIUM CHLORIDE 0.9 % IV SOLN
INTRAVENOUS | Status: DC
  Filled 2021-03-11 (×2): qty 250

## 2021-03-11 NOTE — Progress Notes (Signed)
Patient here for pre treatment check she continues to have a decreased appetite. She has lost 8 pounds since her last visit.

## 2021-03-11 NOTE — Patient Instructions (Signed)
[facility]  Discharge Instructions: ?Thank you for choosing [facility] to provide your oncology and hematology care.  ?If you have a lab appointment with the Cancer Center, please go directly to the Cancer Center and check in at the registration area. ? ?Wear comfortable clothing and clothing appropriate for easy access to any Portacath or PICC line.  ? ?We strive to give you quality time with your provider. You may need to reschedule your appointment if you arrive late (15 or more minutes).  Arriving late affects you and other patients whose appointments are after yours.  Also, if you miss three or more appointments without notifying the office, you may be dismissed from the clinic at the provider?s discretion.    ?  ?For prescription refill requests, have your pharmacy contact our office and allow 72 hours for refills to be completed.   ? ?Today you received the following chemotherapy and/or immunotherapy agents Gemzar    ?  ?To help prevent nausea and vomiting after your treatment, we encourage you to take your nausea medication as directed. ? ?BELOW ARE SYMPTOMS THAT SHOULD BE REPORTED IMMEDIATELY: ?*FEVER GREATER THAN 100.4 F (38 ?C) OR HIGHER ?*CHILLS OR SWEATING ?*NAUSEA AND VOMITING THAT IS NOT CONTROLLED WITH YOUR NAUSEA MEDICATION ?*UNUSUAL SHORTNESS OF BREATH ?*UNUSUAL BRUISING OR BLEEDING ?*URINARY PROBLEMS (pain or burning when urinating, or frequent urination) ?*BOWEL PROBLEMS (unusual diarrhea, constipation, pain near the anus) ?TENDERNESS IN MOUTH AND THROAT WITH OR WITHOUT PRESENCE OF ULCERS (sore throat, sores in mouth, or a toothache) ?UNUSUAL RASH, SWELLING OR PAIN  ?UNUSUAL VAGINAL DISCHARGE OR ITCHING  ? ?Items with * indicate a potential emergency and should be followed up as soon as possible or go to the Emergency Department if any problems should occur. ? ?Please show the CHEMOTHERAPY ALERT CARD or IMMUNOTHERAPY ALERT CARD at check-in to the  Emergency Department and triage nurse. ? ?Should you have questions after your visit or need to cancel or reschedule your appointment, please contact [facility]  [phone] and follow the prompts.  Office hours are 8:00 a.m. to 4:30 p.m. Monday - Friday. Please note that voicemails left after 4:00 p.m. may not be returned until the following business day.  We are closed weekends and major holidays. You have access to a nurse at all times for urgent questions. Please call the main number to the clinic [phone] and follow the prompts. ? ?For any non-urgent questions, you may also contact your provider using MyChart. We now offer e-Visits for anyone 18 and older to request care online for non-urgent symptoms. For details visit [email or web address]. ?  ?Also download the MyChart app! Go to the app store, search "MyChart", open the app, select [facility], and log in with your MyChart username and password. ? ?Due to Covid, a mask is required upon entering the hospital/clinic. If you do not have a mask, one will be given to you upon arrival. For doctor visits, patients may have 1 support person aged 18 or older with them. For treatment visits, patients cannot have anyone with them due to current Covid guidelines and our immunocompromised population.  ?

## 2021-03-13 ENCOUNTER — Encounter: Payer: Self-pay | Admitting: Oncology

## 2021-03-13 NOTE — Progress Notes (Signed)
Hematology/Oncology Consult note [facility]  Telephone:([phone] Fax:([phone]  Patient Care Team: Dion Body, MD as PCP - General (Family Medicine) Clent Jacks, RN as Oncology Nurse Navigator   Name of the patient: Sylvia Miller  [id number]  1940-05-20   Date of visit: 03/13/21  Diagnosis- locally advanced cholangiocarcinoma   Chief complaint/ Reason for visit-on treatment assessment prior to cycle 2-day 1 of gemcitabine chemotherapy  Heme/Onc history: patient is a 81 year old female who was [facility] by [facility] forSymptoms of epigastric pain and weight loss.  She had a CT scan of the abdomen which showed 12.2 cm liver lesion as compared to 6.8 cm lesion in 2021 and 2.2 cm lesion in 2019.  This was followed by a repeat CT scan in September 2022 which showed further enlargement of the mass to 13.7 x 9.2 cm which favors primary pancreaticobiliary versus hepatic malignancy.  Moderate periportal adenopathy.  Patient had an ultrasound-guided biopsy of the level which was consistent with adenocarcinoma.  Cells positive for CK7 and CK19 and negative for CDX2 GATA3 PAX8 and TTF-1.  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   PET CT scan showed dominant left hepatic lobe mass measuring 14.6 x 10.2 cm with an SUV of 11.4.  Porta hepatis nodes 2.2 cm with an SUV of 4.7 and adjacent gastrohepatic ligament lymph node 1.3 cm with an SUV of 4.7.   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  Tumor mutational burden not high.  PD-L1 less than 1%.  Microsatellite stable    Interval history-patient is here with her husband today.  She is sitting in a wheelchair.  Husband reports her appetite has been fair but she continues to lose weight and is down to 126 pounds presently from 134 pounds prio.  Abdominal pain is better controlled.  Bowel movements are  regular   ECOG PS- 2 Pain scale- 0   Review of systems- Review of Systems  Constitutional:  Positive for malaise/fatigue and weight loss. Negative for chills and fever.  HENT:  Negative for congestion, ear discharge and nosebleeds.   Eyes:  Negative for blurred vision.  Respiratory:  Negative for cough, hemoptysis, sputum production, shortness of breath and wheezing.   Cardiovascular:  Negative for chest pain, palpitations, orthopnea and claudication.  Gastrointestinal:  Negative for abdominal pain, blood in stool, constipation, diarrhea, heartburn, melena, nausea and vomiting.  Genitourinary:  Negative for dysuria, flank pain, frequency, hematuria and urgency.  Musculoskeletal:  Negative for back pain, joint pain and myalgias.  Skin:  Negative for rash.  Neurological:  Negative for dizziness, tingling, focal weakness, seizures, weakness and headaches.  Endo/Heme/Allergies:  Does not bruise/bleed easily.  Psychiatric/Behavioral:  Negative for depression and suicidal ideas. The patient does not have insomnia.      Allergies  Allergen Reactions   Pollen Extract Other (See Comments)    Nose runs, eyes itch, etc     Past Medical History:  Diagnosis Date   Allergy    Anemia    Arthritis    rheumatoid /knees/hands/shoulders (Right shoulder) infusion every 5 weeks   Bladder incontinence    Chicken pox    Dyspnea    with exertion or far walking   Gastric ulcer    GERD (gastroesophageal reflux disease)    Heart murmur    Hypercholesteremia    Hyperlipidemia, unspecified    Hypertension    controlled with meds;  MDS (myelodysplastic syndrome) ([place]) 2000   Memory deficit    Myelodysplastic syndrome ([place])    Rheumatoid arthritis ([place])    Ulcer      Past Surgical History:  Procedure Laterality Date   ABDOMINAL HYSTERECTOMY     APPENDECTOMY     as a child   CHOLECYSTECTOMY     COLONOSCOPY     COLONOSCOPY WITH PROPOFOL N/A 04/24/2017   Procedure: COLONOSCOPY WITH  PROPOFOL;  Surgeon: Lollie Sails, MD;  Location: [facility] ENDOSCOPY;  Service: Endoscopy;  Laterality: N/A;   ESOPHAGOGASTRODUODENOSCOPY (EGD) WITH PROPOFOL N/A 04/24/2017   Procedure: ESOPHAGOGASTRODUODENOSCOPY (EGD) WITH PROPOFOL;  Surgeon: Lollie Sails, MD;  Location: [facility] ENDOSCOPY;  Service: Endoscopy;  Laterality: N/A;   JOINT REPLACEMENT     KNEE ARTHROPLASTY Right 02/21/2017   Procedure: COMPUTER ASSISTED TOTAL KNEE ARTHROPLASTY;  Surgeon: Dereck Leep, MD;  Location: [facility] ORS;  Service: Orthopedics;  Laterality: Right;   KNEE ARTHROPLASTY Left 08/06/2017   Procedure: COMPUTER ASSISTED TOTAL KNEE ARTHROPLASTY;  Surgeon: Dereck Leep, MD;  Location: [facility] ORS;  Service: Orthopedics;  Laterality: Left;   NM INTRAVENOUS INJECTION ([facility] HX)     receives biologic infusions every 5 weeks for rheumatoid arthritis. followed by dr. Meda Coffee at [facility] N/A 01/17/2021   Procedure: PORTA CATH INSERTION;  Surgeon: Algernon Huxley, MD;  Location: [place] CV LAB;  Service: Cardiovascular;  Laterality: N/A;   TONSILLECTOMY     UPPER GASTROINTESTINAL ENDOSCOPY     WISDOM TOOTH EXTRACTION      Social History   Socioeconomic History   Marital status: Married    Spouse name: Not on file   Number of children: 2   Years of education: 14   Highest education level: Not on file  Occupational History   Not on file  Tobacco Use   Smoking status: Never   Smokeless tobacco: Never  Vaping Use   Vaping Use: Never used  Substance and Sexual Activity   Alcohol use: Yes    Comment: very occasional   Drug use: Never   Sexual activity: Yes  Other Topics Concern   Not on file  Social History Narrative   Not on file   Social Determinants of Health   Financial Resource Strain: Not on file  Food Insecurity: Not on file  Transportation Needs: Not on file  Physical Activity: Not on file  Stress: Not on file  Social Connections: Not on file  Intimate Partner  Violence: Not on file    Family History  Problem Relation Age of Onset   Cancer Mother    Lung cancer Mother    Lung cancer Father    Colon cancer Neg Hx    Colon polyps Neg Hx    Rectal cancer Neg Hx      Current Outpatient Medications:    acetaminophen (TYLENOL) 325 MG tablet, Take 650 mg by mouth every 6 (six) hours as needed for moderate pain or headache. , Disp: , Rfl:    Artificial Tear Solution (GENTEAL TEARS) 0.1-0.2-0.3 % SOLN, Place 1 drop into both eyes daily as needed (dry eye). , Disp: , Rfl:    aspirin EC 81 MG tablet, Take 81 mg by mouth daily., Disp: , Rfl:    atorvastatin (LIPITOR) 10 MG tablet, Take 5 mg by mouth daily. In the morning, Disp: , Rfl:    benzonatate (TESSALON) 100 MG capsule, Take 1-2 capsules (100-200 mg total) by mouth 3 (three) times  daily as needed for cough., Disp: 30 capsule, Rfl: 0   chlorpheniramine-HYDROcodone (TUSSIONEX) 10-8 MG/5ML SUER, Take 2.5-5 mLs by mouth every 12 (twelve) hours as needed for cough., Disp: 140 mL, Rfl: 0   dexamethasone (DECADRON) 4 MG tablet, Take 2 tablets (8 mg total) by mouth daily. Take daily x 3 days starting the day after cisplatin chemotherapy. Take with food., Disp: 30 tablet, Rfl: 1   diclofenac sodium (VOLTAREN) 1 % GEL, Apply 2 g topically 4 (four) times daily as needed (pain). Apply to painful joint, Disp: , Rfl:    donepezil (ARICEPT) 10 MG tablet, Take 10 mg by mouth at bedtime., Disp: , Rfl:    ferrous sulfate 325 (65 FE) MG tablet, Take 325 mg by mouth daily with breakfast., Disp: , Rfl:    fluticasone (FLONASE) 50 MCG/ACT nasal spray, Place into both nostrils daily., Disp: , Rfl:    hydrochlorothiazide (HYDRODIURIL) 12.5 MG tablet, Take 12.5 mg by mouth daily., Disp: , Rfl:    lidocaine-prilocaine (EMLA) cream, Apply to affected area once, Disp: 30 g, Rfl: 3   lisinopril (ZESTRIL) 20 MG tablet, Take 20 mg by mouth daily. For BP., Disp: , Rfl:    loperamide (IMODIUM) 2 MG capsule, Take 1 capsule (2 mg  total) by mouth as needed for diarrhea or loose stools., Disp: 90 capsule, Rfl: 1   LORazepam (ATIVAN) 0.5 MG tablet, Take 0.5 mg by mouth once., Disp: , Rfl:    meloxicam (MOBIC) 7.5 MG tablet, Take 7.5 mg by mouth daily. For muscle pains., Disp: , Rfl:    memantine (NAMENDA) 10 MG tablet, Take 10 mg by mouth 2 (two) times daily., Disp: , Rfl:    Menthol, Topical Analgesic, (BIOFREEZE EX), Apply 1 application topically as needed (for knee/leg pain)., Disp: , Rfl:    mirtazapine (REMERON) 7.5 MG tablet, Take 7.5 mg by mouth at bedtime., Disp: , Rfl:    Multiple Vitamins-Minerals (OCUVITE PO), Take 0.5 tablets by mouth in the morning and at bedtime., Disp: , Rfl:    ondansetron (ZOFRAN) 8 MG tablet, Take 1 tablet (8 mg total) by mouth 2 (two) times daily as needed. Start on the third day after cisplatin chemotherapy., Disp: 30 tablet, Rfl: 1   oxybutynin (DITROPAN-XL) 10 MG 24 hr tablet, Take 10 mg by mouth at bedtime. Urinary Frequency., Disp: , Rfl:    oxyCODONE (OXY IR/ROXICODONE) 5 MG immediate release tablet, Take 1-2 tablets (5-10 mg total) by mouth every 6 (six) hours as needed for severe pain., Disp: 90 tablet, Rfl: 0   pantoprazole (PROTONIX) 40 MG tablet, Take 1 tablet (40 mg total) by mouth daily., Disp: 60 tablet, Rfl: 3   potassium chloride SA (KLOR-CON M) 20 MEQ tablet, TAKE 1 TABLET BY MOUTH TWICE DAILY, Disp: 14 tablet, Rfl: 0   prochlorperazine (COMPAZINE) 10 MG tablet, Take 1 tablet (10 mg total) by mouth every 6 (six) hours as needed (Nausea or vomiting)., Disp: 30 tablet, Rfl: 1   sulfaSALAzine (AZULFIDINE) 500 MG tablet, Take 500 mg by mouth 2 (two) times daily. Anti-Inflammatory for 90 days., Disp: , Rfl:    triamcinolone cream (KENALOG) 0.5 %, Apply topically., Disp: , Rfl:  No current facility-administered medications for this visit.  Facility-Administered Medications Ordered in Other Visits:    heparin lock flush 100 UNIT/ML injection, , , ,   Physical exam:  Vitals:    03/11/21 0932  BP: (!) 141/85  Pulse: 86  Resp: 16  Temp: 98.1 F (36.7 C)  TempSrc:  Tympanic  Weight: 126 lb (57.2 kg)   Physical Exam Constitutional:      Comments: Elderly and frail. Sitting in a wheelchair  Cardiovascular:     Rate and Rhythm: Normal rate and regular rhythm.     Heart sounds: Normal heart sounds.  Pulmonary:     Effort: Pulmonary effort is normal.     Breath sounds: Normal breath sounds.  Abdominal:     General: Bowel sounds are normal.     Palpations: Abdomen is soft.  Skin:    General: Skin is warm and dry.  Neurological:     Mental Status: She is alert and oriented to person, place, and time.     CMP Latest Ref Rng & Units 03/11/2021  Glucose 70 - 99 mg/dL 105(H)  BUN 8 - 23 mg/dL 17  Creatinine 0.44 - 1.00 mg/dL 0.96  Sodium 135 - 145 mmol/L 140  Potassium 3.5 - 5.1 mmol/L 3.3(L)  Chloride 98 - 111 mmol/L 107  CO2 22 - 32 mmol/L 23  Calcium 8.9 - 10.3 mg/dL 8.6(L)  Total Protein 6.5 - 8.1 g/dL 6.5  Total Bilirubin 0.3 - 1.2 mg/dL 0.5  Alkaline Phos 38 - 126 U/L 120  AST 15 - 41 U/L 27  ALT 0 - 44 U/L 17   CBC Latest Ref Rng & Units 03/11/2021  WBC 4.0 - 10.5 K/uL 16.7(H)  Hemoglobin 12.0 - 15.0 g/dL 8.6(L)  Hematocrit 36.0 - 46.0 % 28.3(L)  Platelets 150 - 400 K/uL 309    DG Chest 2 View  Result Date: 02/18/2021 CLINICAL DATA:  Persistent cough, history of cholangiocarcinoma. EXAM: CHEST - 2 VIEW COMPARISON:  Jul 06, 2020. FINDINGS: Stable cardiomediastinal silhouette. Interval placement of right internal jugular Port-A-Cath with distal tip in expected position of the SVC. No pneumothorax is noted. Right lung is clear. Minimal left basilar subsegmental atelectasis is noted. Bony thorax is unremarkable. IMPRESSION: Minimal left basilar subsegmental atelectasis. Electronically Signed   By: Marijo Conception M.D.   On: 02/18/2021 15:37     Assessment and plan- Patient is a 81 y.o. 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  She is here for on treatment assessment prior to cycle 2-day 1 of gemcitabine chemotherapy  Patient has been receiving reduced dose of gemcitabine chemotherapy at 800 mg meter squared.  We have been doing it 2 weeks on and 1 week off to see if she can tolerate it better and we have not been able to add cisplatin.  Despite that she continues to have worsening anemia and her hemoglobin presently is down to 8.6 from a baseline of 10.9 prior to starting chemotherapy.  Iron studies B12 and folate were all indicated of anemia from chemotherapy.  I therefore recommend altering her chemotherapy regimen to 1 week on 1 week off for better tolerance and reducing the gemcitabine dose to 700 mg per metered squared prior.  We will plan to check repeat scans after 3 cycles of chemotherapy.  She does not require growth factor support with this cycle.  We will give her 1 L of IV fluids today and also plan for her to be seen at [facility] for possible IV fluids in 1 week's time.  We discussed that if patient does not tolerate chemotherapy well and continues to have significant anemia and weight loss best supportive care/hospice would be reasonable as well.    Visit Diagnosis 1. Encounter for antineoplastic chemotherapy   2. Anemia due to antineoplastic chemotherapy  Dr. Randa Evens, MD, MPH [facility] [phone] 03/13/2021 10:29 AM

## 2021-03-14 ENCOUNTER — Ambulatory Visit: Payer: Medicare Other | Admitting: Oncology

## 2021-03-14 ENCOUNTER — Ambulatory Visit: Payer: Medicare Other

## 2021-03-14 ENCOUNTER — Other Ambulatory Visit: Payer: Medicare Other

## 2021-03-21 ENCOUNTER — Ambulatory Visit: Payer: Medicare Other

## 2021-03-21 ENCOUNTER — Inpatient Hospital Stay (HOSPITAL_BASED_OUTPATIENT_CLINIC_OR_DEPARTMENT_OTHER): Payer: Medicare Other | Admitting: Nurse Practitioner

## 2021-03-21 ENCOUNTER — Other Ambulatory Visit: Payer: Self-pay

## 2021-03-21 ENCOUNTER — Inpatient Hospital Stay: Payer: Medicare Other

## 2021-03-21 VITALS — BP 139/94 | HR 90 | Temp 97.3°F | Resp 16 | Wt 129.1 lb

## 2021-03-21 DIAGNOSIS — E876 Hypokalemia: Secondary | ICD-10-CM | POA: Diagnosis not present

## 2021-03-21 DIAGNOSIS — C221 Intrahepatic bile duct carcinoma: Secondary | ICD-10-CM

## 2021-03-21 DIAGNOSIS — D6481 Anemia due to antineoplastic chemotherapy: Secondary | ICD-10-CM | POA: Diagnosis not present

## 2021-03-21 DIAGNOSIS — T451X5A Adverse effect of antineoplastic and immunosuppressive drugs, initial encounter: Secondary | ICD-10-CM

## 2021-03-21 DIAGNOSIS — E860 Dehydration: Secondary | ICD-10-CM

## 2021-03-21 DIAGNOSIS — Z5111 Encounter for antineoplastic chemotherapy: Secondary | ICD-10-CM | POA: Diagnosis not present

## 2021-03-21 DIAGNOSIS — R7989 Other specified abnormal findings of blood chemistry: Secondary | ICD-10-CM

## 2021-03-21 LAB — COMPREHENSIVE METABOLIC PANEL
ALT: 33 U/L (ref 0–44)
AST: 46 U/L — ABNORMAL HIGH (ref 15–41)
Albumin: 3.4 g/dL — ABNORMAL LOW (ref 3.5–5.0)
Alkaline Phosphatase: 135 U/L — ABNORMAL HIGH (ref 38–126)
Anion gap: 14 (ref 5–15)
BUN: 20 mg/dL (ref 8–23)
CO2: 21 mmol/L — ABNORMAL LOW (ref 22–32)
Calcium: 8.7 mg/dL — ABNORMAL LOW (ref 8.9–10.3)
Chloride: 102 mmol/L (ref 98–111)
Creatinine, Ser: 1.46 mg/dL — ABNORMAL HIGH (ref 0.44–1.00)
GFR, Estimated: 36 mL/min — ABNORMAL LOW (ref >60)
Glucose, Bld: 113 mg/dL — ABNORMAL HIGH (ref 70–99)
Potassium: 3.4 mmol/L — ABNORMAL LOW (ref 3.5–5.1)
Sodium: 137 mmol/L (ref 135–145)
Total Bilirubin: 0.5 mg/dL (ref 0.3–1.2)
Total Protein: 6.6 g/dL (ref 6.5–8.1)

## 2021-03-21 LAB — CBC WITH DIFFERENTIAL/PLATELET
Abs Immature Granulocytes: 0.13 10*3/uL — ABNORMAL HIGH (ref 0.00–0.07)
Basophils Absolute: 0 10*3/uL (ref 0.0–0.1)
Basophils Relative: 0 %
Eosinophils Absolute: 0 10*3/uL (ref 0.0–0.5)
Eosinophils Relative: 0 %
HCT: 27.2 % — ABNORMAL LOW (ref 36.0–46.0)
Hemoglobin: 8.3 g/dL — ABNORMAL LOW (ref 12.0–15.0)
Immature Granulocytes: 1 %
Lymphocytes Relative: 17 %
Lymphs Abs: 3 10*3/uL (ref 0.7–4.0)
MCH: 32.5 pg (ref 26.0–34.0)
MCHC: 30.5 g/dL (ref 30.0–36.0)
MCV: 106.7 fL — ABNORMAL HIGH (ref 80.0–100.0)
Monocytes Absolute: 1.6 10*3/uL — ABNORMAL HIGH (ref 0.1–1.0)
Monocytes Relative: 9 %
Neutro Abs: 12.8 10*3/uL — ABNORMAL HIGH (ref 1.7–7.7)
Neutrophils Relative %: 73 %
Platelets: 158 10*3/uL (ref 150–400)
RBC: 2.55 MIL/uL — ABNORMAL LOW (ref 3.87–5.11)
RDW: 17.8 % — ABNORMAL HIGH (ref 11.5–15.5)
WBC: 17.6 10*3/uL — ABNORMAL HIGH (ref 4.0–10.5)
nRBC: 0 % (ref 0.0–0.2)

## 2021-03-21 MED ORDER — HEPARIN SOD (PORK) LOCK FLUSH 100 UNIT/ML IV SOLN
500.0000 [IU] | Freq: Once | INTRAVENOUS | Status: AC
  Administered 2021-03-21: 500 [IU] via INTRAVENOUS
  Filled 2021-03-21: qty 5

## 2021-03-21 MED ORDER — SODIUM CHLORIDE 0.9 % IV SOLN
Freq: Once | INTRAVENOUS | Status: AC
  Filled 2021-03-21: qty 250

## 2021-03-21 MED ORDER — SODIUM CHLORIDE 0.9% FLUSH
10.0000 mL | Freq: Once | INTRAVENOUS | Status: AC
  Administered 2021-03-21: 10 mL via INTRAVENOUS
  Filled 2021-03-21: qty 10

## 2021-03-21 NOTE — Progress Notes (Signed)
Pt to receive 1 L NS over 1 hour. Potassium is 3.4. Pt has not been taking her potassium pills. NP is sending refill in for pt to take 1 tablet daily. Pt is eating and drinking without any difficulties.

## 2021-03-21 NOTE — Progress Notes (Signed)
Hematology/Oncology Consult note [facility]  [phone] [phone]  Patient Care Team: Dion Body, MD as PCP - General (Family Medicine) Clent Jacks, RN as Oncology Nurse Navigator   Name of the patient: Sylvia Miller  [id number]  [birth year]   Date of visit: 03/21/21  Diagnosis- locally advanced cholangiocarcinoma   Chief complaint/ Reason for visit- follow up following gemcitabine chemotherapy for possible fluids  Heme/Onc history: patient is a 81 year old female who was seen by [facility] forSymptoms of epigastric pain and weight loss.  She had a CT scan of the abdomen which showed 12.2 cm liver lesion as compared to 6.8 cm lesion in 2021 and 2.2 cm lesion in 2019.  This was followed by a repeat CT scan in September 2022 which showed further enlargement of the mass to 13.7 x 9.2 cm which favors primary pancreaticobiliary versus hepatic malignancy.  Moderate periportal adenopathy.  Patient had an ultrasound-guided biopsy of the level which was consistent with adenocarcinoma.  Cells positive for CK7 and CK19 and negative for CDX2 GATA3 PAX8 and TTF-1.  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   PET CT scan showed dominant left hepatic lobe mass measuring 14.6 x 10.2 cm with an SUV of 11.4.  Porta hepatis nodes 2.2 cm with an SUV of 4.7 and adjacent gastrohepatic ligament lymph node 1.3 cm with an SUV of 4.7.   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  Tumor mutational burden not high.  PD-L1 less than 1%.  Microsatellite stable    Interval history- patient is here with her husband today.  She is in a recliner.  Says she feels about the same.  Weight is up.  Husband has been encouraging her to eat.  She has ongoing fatigue but she feels this is stable.  No specific complaints today.  ECOG PS- 3 Pain scale- 0  Review of  systems- Review of Systems  Constitutional:  Positive for malaise/fatigue. Negative for chills, fever and weight loss.  HENT:  Negative for congestion, ear discharge and nosebleeds.   Eyes:  Negative for blurred vision.  Respiratory:  Negative for cough, hemoptysis, sputum production, shortness of breath and wheezing.   Cardiovascular:  Negative for chest pain, palpitations, orthopnea and claudication.  Gastrointestinal:  Negative for abdominal pain, blood in stool, constipation, diarrhea, heartburn, melena, nausea and vomiting.  Genitourinary:  Negative for dysuria, flank pain, frequency, hematuria and urgency.  Musculoskeletal:  Negative for back pain, joint pain and myalgias.  Skin:  Negative for rash.  Neurological:  Negative for dizziness, tingling, focal weakness, seizures, weakness and headaches.  Endo/Heme/Allergies:  Does not bruise/bleed easily.  Psychiatric/Behavioral:  Negative for depression and suicidal ideas. The patient does not have insomnia.      Allergies  Allergen Reactions   Pollen Extract Other (See Comments)    Nose runs, eyes itch, etc     Past Medical History:  Diagnosis Date   Allergy    Anemia    Arthritis    rheumatoid /knees/hands/shoulders (Right shoulder) infusion every 5 weeks   Bladder incontinence    Chicken pox    Dyspnea    with exertion or far walking   Gastric ulcer    GERD (gastroesophageal reflux disease)    Heart murmur    Hypercholesteremia    Hyperlipidemia, unspecified    Hypertension    controlled with meds;    MDS (myelodysplastic syndrome) ([place]) 2000  Memory deficit    Myelodysplastic syndrome ([place])    Rheumatoid arthritis ([place])    Ulcer      Past Surgical History:  Procedure Laterality Date   ABDOMINAL HYSTERECTOMY     APPENDECTOMY     as a child   CHOLECYSTECTOMY     COLONOSCOPY     COLONOSCOPY WITH PROPOFOL N/A 04/24/2017   Procedure: COLONOSCOPY WITH PROPOFOL;  Surgeon: Lollie Sails, MD;  Location: [facility]  ENDOSCOPY;  Service: Endoscopy;  Laterality: N/A;   ESOPHAGOGASTRODUODENOSCOPY (EGD) WITH PROPOFOL N/A 04/24/2017   Procedure: ESOPHAGOGASTRODUODENOSCOPY (EGD) WITH PROPOFOL;  Surgeon: Lollie Sails, MD;  Location: [facility] ENDOSCOPY;  Service: Endoscopy;  Laterality: N/A;   JOINT REPLACEMENT     KNEE ARTHROPLASTY Right 02/21/2017   Procedure: COMPUTER ASSISTED TOTAL KNEE ARTHROPLASTY;  Surgeon: Dereck Leep, MD;  Location: [facility] ORS;  Service: Orthopedics;  Laterality: Right;   KNEE ARTHROPLASTY Left 08/06/2017   Procedure: COMPUTER ASSISTED TOTAL KNEE ARTHROPLASTY;  Surgeon: Dereck Leep, MD;  Location: [facility] ORS;  Service: Orthopedics;  Laterality: Left;   NM INTRAVENOUS INJECTION ([facility] HX)     receives biologic infusions every 5 weeks for rheumatoid arthritis. followed by dr. Meda Coffee at [place] N/A 01/17/2021   Procedure: PORTA CATH INSERTION;  Surgeon: Algernon Huxley, MD;  Location: [facility];  Service: Cardiovascular;  Laterality: N/A;   TONSILLECTOMY     UPPER GASTROINTESTINAL ENDOSCOPY     WISDOM TOOTH EXTRACTION      Social History   Socioeconomic History   Marital status: Married    Spouse name: Not on file   Number of children: 2   Years of education: 14   Highest education level: Not on file  Occupational History   Not on file  Tobacco Use   Smoking status: Never   Smokeless tobacco: Never  Vaping Use   Vaping Use: Never used  Substance and Sexual Activity   Alcohol use: Yes    Comment: very occasional   Drug use: Never   Sexual activity: Yes  Other Topics Concern   Not on file  Social History Narrative   Not on file   Social Determinants of Health   Financial Resource Strain: Not on file  Food Insecurity: Not on file  Transportation Needs: Not on file  Physical Activity: Not on file  Stress: Not on file  Social Connections: Not on file  Intimate Partner Violence: Not on file    Family History  Problem Relation  Age of Onset   Cancer Mother    Lung cancer Mother    Lung cancer Father    Colon cancer Neg Hx    Colon polyps Neg Hx    Rectal cancer Neg Hx      Current Outpatient Medications:    acetaminophen (TYLENOL) 325 MG tablet, Take 650 mg by mouth every 6 (six) hours as needed for moderate pain or headache. , Disp: , Rfl:    Artificial Tear Solution (GENTEAL TEARS) 0.1-0.2-0.3 % SOLN, Place 1 drop into both eyes daily as needed (dry eye). , Disp: , Rfl:    aspirin EC 81 MG tablet, Take 81 mg by mouth daily., Disp: , Rfl:    atorvastatin (LIPITOR) 10 MG tablet, Take 5 mg by mouth daily. In the morning, Disp: , Rfl:    benzonatate (TESSALON) 100 MG capsule, Take 1-2 capsules (100-200 mg total) by mouth 3 (three) times daily as needed for cough., Disp: 30  capsule, Rfl: 0   chlorpheniramine-HYDROcodone (TUSSIONEX) 10-8 MG/5ML SUER, Take 2.5-5 mLs by mouth every 12 (twelve) hours as needed for cough., Disp: 140 mL, Rfl: 0   dexamethasone (DECADRON) 4 MG tablet, Take 2 tablets (8 mg total) by mouth daily. Take daily x 3 days starting the day after cisplatin chemotherapy. Take with food., Disp: 30 tablet, Rfl: 1   diclofenac sodium (VOLTAREN) 1 % GEL, Apply 2 g topically 4 (four) times daily as needed (pain). Apply to painful joint, Disp: , Rfl:    donepezil (ARICEPT) 10 MG tablet, Take 10 mg by mouth at bedtime., Disp: , Rfl:    ferrous sulfate 325 (65 FE) MG tablet, Take 325 mg by mouth daily with breakfast., Disp: , Rfl:    fluticasone (FLONASE) 50 MCG/ACT nasal spray, Place into both nostrils daily., Disp: , Rfl:    hydrochlorothiazide (HYDRODIURIL) 12.5 MG tablet, Take 12.5 mg by mouth daily., Disp: , Rfl:    hydroxychloroquine (PLAQUENIL) 200 MG tablet, Take 200 mg by mouth daily., Disp: , Rfl:    lidocaine-prilocaine (EMLA) cream, Apply to affected area once, Disp: 30 g, Rfl: 3   lisinopril (ZESTRIL) 20 MG tablet, Take 20 mg by mouth daily. For BP., Disp: , Rfl:    loperamide (IMODIUM) 2 MG  capsule, Take 1 capsule (2 mg total) by mouth as needed for diarrhea or loose stools., Disp: 90 capsule, Rfl: 1   LORazepam (ATIVAN) 0.5 MG tablet, Take 0.5 mg by mouth once., Disp: , Rfl:    meloxicam (MOBIC) 7.5 MG tablet, Take 7.5 mg by mouth daily. For muscle pains., Disp: , Rfl:    memantine (NAMENDA) 10 MG tablet, Take 10 mg by mouth 2 (two) times daily., Disp: , Rfl:    Menthol, Topical Analgesic, (BIOFREEZE EX), Apply 1 application topically as needed (for knee/leg pain)., Disp: , Rfl:    mirtazapine (REMERON) 7.5 MG tablet, Take 7.5 mg by mouth at bedtime., Disp: , Rfl:    Multiple Vitamins-Minerals (OCUVITE PO), Take 0.5 tablets by mouth in the morning and at bedtime., Disp: , Rfl:    ondansetron (ZOFRAN) 8 MG tablet, Take 1 tablet (8 mg total) by mouth 2 (two) times daily as needed. Start on the third day after cisplatin chemotherapy., Disp: 30 tablet, Rfl: 1   oxybutynin (DITROPAN-XL) 10 MG 24 hr tablet, Take 10 mg by mouth at bedtime. Urinary Frequency., Disp: , Rfl:    oxyCODONE (OXY IR/ROXICODONE) 5 MG immediate release tablet, Take 1-2 tablets (5-10 mg total) by mouth every 6 (six) hours as needed for severe pain., Disp: 90 tablet, Rfl: 0   pantoprazole (PROTONIX) 40 MG tablet, Take 1 tablet (40 mg total) by mouth daily., Disp: 60 tablet, Rfl: 3   potassium chloride SA (KLOR-CON M) 20 MEQ tablet, TAKE 1 TABLET BY MOUTH TWICE DAILY, Disp: 14 tablet, Rfl: 0   prochlorperazine (COMPAZINE) 10 MG tablet, Take 1 tablet (10 mg total) by mouth every 6 (six) hours as needed (Nausea or vomiting)., Disp: 30 tablet, Rfl: 1   sulfaSALAzine (AZULFIDINE) 500 MG tablet, Take 500 mg by mouth 2 (two) times daily. Anti-Inflammatory for 90 days., Disp: , Rfl:    triamcinolone cream (KENALOG) 0.5 %, Apply topically., Disp: , Rfl:  No current facility-administered medications for this visit.  Facility-Administered Medications Ordered in Other Visits:    heparin lock flush 100 UNIT/ML injection, , , ,     heparin lock flush 100 unit/mL, 500 Units, Intravenous, Once, Verlon Au, NP  Physical  exam:  Vitals:   03/21/21 0851  BP: (!) 139/94  Pulse: 90  Resp: 16  Temp: (!) 97.3 F (36.3 C)  TempSrc: Tympanic  SpO2: 97%  Weight: 129 lb 1.6 oz (58.6 kg)   Physical Exam Constitutional:      Appearance: She is ill-appearing.     Comments: Elderly and frail. In recliner. Accompanied by husband.   Cardiovascular:     Rate and Rhythm: Normal rate and regular rhythm.  Pulmonary:     Effort: Pulmonary effort is normal.     Breath sounds: Normal breath sounds.  Skin:    General: Skin is warm and dry.  Neurological:     Mental Status: She is alert and oriented to person, place, and time.  Psychiatric:        Mood and Affect: Mood normal.        Behavior: Behavior normal.     CMP Latest Ref Rng & Units 03/21/2021  Glucose 70 - 99 mg/dL 113(H)  BUN 8 - 23 mg/dL 20  Creatinine 0.44 - 1.00 mg/dL 1.46(H)  Sodium 135 - 145 mmol/L 137  Potassium 3.5 - 5.1 mmol/L 3.4(L)  Chloride 98 - 111 mmol/L 102  CO2 22 - 32 mmol/L 21(L)  Calcium 8.9 - 10.3 mg/dL 8.7(L)  Total Protein 6.5 - 8.1 g/dL 6.6  Total Bilirubin 0.3 - 1.2 mg/dL 0.5  Alkaline Phos 38 - 126 U/L 135(H)  AST 15 - 41 U/L 46(H)  ALT 0 - 44 U/L 33   CBC Latest Ref Rng & Units 03/21/2021  WBC 4.0 - 10.5 K/uL 17.6(H)  Hemoglobin 12.0 - 15.0 g/dL 8.3(L)  Hematocrit 36.0 - 46.0 % 27.2(L)  Platelets 150 - 400 K/uL 158    No results found.   Assessment and plan- Patient is a 81 y.o. 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  currently receiving gemcitabine 700 mg, 1 week on, 1 week off d/t tolerance. Plan to repeat scans after 3 cycles of chemotherapy.   2. Hypokalemia- potassium 3.4. Not on oral potassium. Start KDur 20 meq daily.   3. Elevated creatinine- IVF today  4. Chemotherapy induced anemia- previously iron studies, b12, folate were normal. Likely gemcitabine. Hmg 8.3 today. Mnoitor.   Follow  up with Dr. [name] as scheduled. Return to clinic in interim as needed.    Visit Diagnosis 1. Anemia due to antineoplastic chemotherapy   2. Primary cholangiocarcinoma of intrahepatic bile duct (HCC)   3. Elevated serum creatinine   4. Hypokalemia     Beckey Rutter, DNP, AGNP-C [place] at [facility] [phone] (clinic) 03/21/2021

## 2021-03-22 ENCOUNTER — Ambulatory Visit: Payer: Medicare Other

## 2021-03-24 ENCOUNTER — Other Ambulatory Visit: Payer: Self-pay

## 2021-03-24 ENCOUNTER — Inpatient Hospital Stay (HOSPITAL_BASED_OUTPATIENT_CLINIC_OR_DEPARTMENT_OTHER): Payer: Medicare Other | Admitting: Hospice and Palliative Medicine

## 2021-03-24 DIAGNOSIS — R531 Weakness: Secondary | ICD-10-CM | POA: Diagnosis not present

## 2021-03-24 DIAGNOSIS — C221 Intrahepatic bile duct carcinoma: Secondary | ICD-10-CM | POA: Diagnosis not present

## 2021-03-24 NOTE — Progress Notes (Signed)
Virtual Visit via Telephone Note  I connected with [name] on 03/24/21 at  1:50 PM EST by telephone and verified that I am speaking with the correct person using two identifiers.  Location: Patient: Home Provider: Clinic   I discussed the limitations, risks, security and privacy concerns of performing an evaluation and management service by telephone and the availability of in person appointments. I also discussed with the patient that there may be a patient responsible charge related to this service. The patient expressed understanding and agreed to proceed.   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  Patient is on systemic chemotherapy with single agent gemcitabine, given concern for possible intolerance of a more aggressive chemotherapy regimen.   Observations/Objective: I spoke with patient and husband today by phone.  They report the patient is doing better.  Her appetite is improved and she is eating two meals a day and drinking Ensure supplements.  No significant symptomatic complaints or concerns at present.  Husband says the patient does still have some fatigue and is now ambulating with use of a walker whereas that was not previously needed.  We discussed getting patient established with [name], OT for rehab screening.   Assessment and Plan: Intrahepatic cholangiocarcinoma -on single agent gemcitabine chemotherapy with 1 week on 1 week off due to poor tolerance.  Patient has follow-up scheduled tomorrow with Dr. [name].  Weakness -using a walker to ambulate.  Will refer to [facility] for rehab screening   Follow Up Instructions: Follow-up telephone visit 1 month   I discussed the assessment and treatment plan with the patient. The patient was provided an opportunity to ask questions  and all were answered. The patient agreed with the plan and demonstrated an understanding of the instructions.   The patient was advised to call back or seek an in-person evaluation if the symptoms worsen or if the condition fails to improve as anticipated.  I provided 6 minutes of non-face-to-face time during this encounter.   [name], NP

## 2021-03-25 ENCOUNTER — Encounter: Payer: Self-pay | Admitting: Oncology

## 2021-03-25 ENCOUNTER — Telehealth: Payer: Self-pay | Admitting: *Deleted

## 2021-03-25 ENCOUNTER — Inpatient Hospital Stay: Payer: Medicare Other

## 2021-03-25 ENCOUNTER — Inpatient Hospital Stay (HOSPITAL_BASED_OUTPATIENT_CLINIC_OR_DEPARTMENT_OTHER): Payer: Medicare Other | Admitting: Oncology

## 2021-03-25 VITALS — BP 135/64 | HR 95 | Temp 97.0°F | Resp 17 | Wt 126.0 lb

## 2021-03-25 DIAGNOSIS — Z5111 Encounter for antineoplastic chemotherapy: Secondary | ICD-10-CM

## 2021-03-25 DIAGNOSIS — C221 Intrahepatic bile duct carcinoma: Secondary | ICD-10-CM

## 2021-03-25 DIAGNOSIS — D6481 Anemia due to antineoplastic chemotherapy: Secondary | ICD-10-CM

## 2021-03-25 DIAGNOSIS — T451X5A Adverse effect of antineoplastic and immunosuppressive drugs, initial encounter: Secondary | ICD-10-CM

## 2021-03-25 DIAGNOSIS — N179 Acute kidney failure, unspecified: Secondary | ICD-10-CM | POA: Diagnosis not present

## 2021-03-25 LAB — CBC WITH DIFFERENTIAL/PLATELET
Abs Immature Granulocytes: 0.12 10*3/uL — ABNORMAL HIGH (ref 0.00–0.07)
Basophils Absolute: 0.1 10*3/uL (ref 0.0–0.1)
Basophils Relative: 1 %
Eosinophils Absolute: 0.1 10*3/uL (ref 0.0–0.5)
Eosinophils Relative: 0 %
HCT: 28.8 % — ABNORMAL LOW (ref 36.0–46.0)
Hemoglobin: 8.8 g/dL — ABNORMAL LOW (ref 12.0–15.0)
Immature Granulocytes: 1 %
Lymphocytes Relative: 14 %
Lymphs Abs: 2.2 10*3/uL (ref 0.7–4.0)
MCH: 33.3 pg (ref 26.0–34.0)
MCHC: 30.6 g/dL (ref 30.0–36.0)
MCV: 109.1 fL — ABNORMAL HIGH (ref 80.0–100.0)
Monocytes Absolute: 1.3 10*3/uL — ABNORMAL HIGH (ref 0.1–1.0)
Monocytes Relative: 8 %
Neutro Abs: 11.6 10*3/uL — ABNORMAL HIGH (ref 1.7–7.7)
Neutrophils Relative %: 76 %
Platelets: 224 10*3/uL (ref 150–400)
RBC: 2.64 MIL/uL — ABNORMAL LOW (ref 3.87–5.11)
RDW: 18.5 % — ABNORMAL HIGH (ref 11.5–15.5)
WBC: 15.4 10*3/uL — ABNORMAL HIGH (ref 4.0–10.5)
nRBC: 0 % (ref 0.0–0.2)

## 2021-03-25 LAB — COMPREHENSIVE METABOLIC PANEL
ALT: 22 U/L (ref 0–44)
AST: 34 U/L (ref 15–41)
Albumin: 3.5 g/dL (ref 3.5–5.0)
Alkaline Phosphatase: 134 U/L — ABNORMAL HIGH (ref 38–126)
Anion gap: 12 (ref 5–15)
BUN: 16 mg/dL (ref 8–23)
CO2: 22 mmol/L (ref 22–32)
Calcium: 8.7 mg/dL — ABNORMAL LOW (ref 8.9–10.3)
Chloride: 103 mmol/L (ref 98–111)
Creatinine, Ser: 1.56 mg/dL — ABNORMAL HIGH (ref 0.44–1.00)
GFR, Estimated: 33 mL/min — ABNORMAL LOW (ref >60)
Glucose, Bld: 131 mg/dL — ABNORMAL HIGH (ref 70–99)
Potassium: 3.6 mmol/L (ref 3.5–5.1)
Sodium: 137 mmol/L (ref 135–145)
Total Bilirubin: 0.2 mg/dL — ABNORMAL LOW (ref 0.3–1.2)
Total Protein: 6.5 g/dL (ref 6.5–8.1)

## 2021-03-25 LAB — SAMPLE TO BLOOD BANK

## 2021-03-25 MED ORDER — SODIUM CHLORIDE 0.9 % IV SOLN
700.0000 mg/m2 | Freq: Once | INTRAVENOUS | Status: AC
  Administered 2021-03-25: 1102 mg via INTRAVENOUS
  Filled 2021-03-25: qty 26.3

## 2021-03-25 MED ORDER — SODIUM CHLORIDE 0.9 % IV SOLN
Freq: Once | INTRAVENOUS | Status: DC
  Filled 2021-03-25: qty 250

## 2021-03-25 MED ORDER — SODIUM CHLORIDE 0.9 % IV SOLN
10.0000 mg | Freq: Once | INTRAVENOUS | Status: AC
  Administered 2021-03-25: 10 mg via INTRAVENOUS
  Filled 2021-03-25: qty 10

## 2021-03-25 MED ORDER — SODIUM CHLORIDE 0.9 % IV SOLN
Freq: Once | INTRAVENOUS | Status: AC
  Filled 2021-03-25: qty 250

## 2021-03-25 MED ORDER — HEPARIN SOD (PORK) LOCK FLUSH 100 UNIT/ML IV SOLN
500.0000 [IU] | Freq: Once | INTRAVENOUS | Status: AC | PRN
  Administered 2021-03-25: 500 [IU]
  Filled 2021-03-25: qty 5

## 2021-03-25 NOTE — Progress Notes (Signed)
Nutrition Assessment ° ° °Reason for Assessment:  °Malnutrition screening report ° ° °ASSESSMENT:  °80 year old female with stage IIIb intrahepatic cholangiocarcinoma.  Past medical history of GERD, HLD, MDS, HTN, dementia.  Patient receiving gemcitabine.  ° °Met with patient during infusion.  Patient confused and unable to give meaningful information to RD.  Husband in waiting area and was able to speak with him in consult room.  Husband reports poor appetite.  She is eating few bites of meals.  Ate 1/2 of apple turnover.  Daughter makes her ensure shakes with ice cream and she drinks some of those but she does not know that it is shake per husband.  He says that she does not complain of nausea, sometimes stomach pain but she usually has to have a bowel movement and then stomach pain is resolved. ° ° °Medications: remeron ° ° °Labs: reviewed ° ° °Anthropometrics:  ° °Height: 4ft 9 inches °Weight: 126 lb °UBW: 138 lb 01/27/2021 °BMI: 27 ° °9% weight loss in the last 2 months, significant ° ° °NUTRITION DIAGNOSIS: Inadequate oral intake related to cancer related treatment side effects as evidenced by 9% weight loss in the last 2 months and poor po intake ° ° ° °INTERVENTION:  °Discussed ways to add calories and protein in small amount that patient is able to eat. Handout provided to husband °Encouraged 350 calorie shake or higher and continue to blend with ice cream. °Contact information given ° ° °MONITORING, EVALUATION, GOAL: weight trends, intake ° ° °Next Visit: Friday, Feb 3rd  ° ° B. , RD, LDN °Registered Dietitian °[phone] (mobile) ° ° ° ° ° °

## 2021-03-25 NOTE — Telephone Encounter (Signed)
Rcvd incoming referral from [place] for OT screen Sylvia Miller  03/25/21 at 1106 am - Attempted to reach patient's husband to discuss referral to [facility]. Phone rings on home line. Unable to leave vm- as vm is not set up. Attempted to reach patient/pt's husband via cell phone.- phone rings. "Call can not be completed at this time. Please hang up and try again."  Patient does have an apt today with Dr. [name]. I will see if our team/scheduling can speak to patient referral referral date preference when pt comes to this apt.- Nira Conn, RN

## 2021-03-25 NOTE — Progress Notes (Signed)
Hematology/Oncology Consult note [facility]  [phone] [phone]  Patient Care Team: Dion Body, MD as PCP - General (Family Medicine) Clent Jacks, RN as Oncology Nurse Navigator Borders, Kirt Boys, NP as Nurse Practitioner (Hospice and Palliative Medicine) Sindy Guadeloupe, MD as Consulting Physician (Oncology) Quintin Alto, MD as Consulting Physician (Rheumatology)   Name of the patient: Sylvia Miller  [id number]  May 03, 1940   Date of visit: 03/25/21  Diagnosis- locally advanced cholangiocarcinoma  Chief complaint/ Reason for visit-on treatment assessment prior to cycle 3-day 1 of single agent gemcitabine chemotherapy  Heme/Onc history: patient is a 81 year old female who was [facility] by [facility] forSymptoms of epigastric pain and weight loss.  She had a CT scan of the abdomen which showed 12.2 cm liver lesion as compared to 6.8 cm lesion in 2021 and 2.2 cm lesion in 2019.  This was followed by a repeat CT scan in September 2022 which showed further enlargement of the mass to 13.7 x 9.2 cm which favors primary pancreaticobiliary versus hepatic malignancy.  Moderate periportal adenopathy.  Patient had an ultrasound-guided biopsy of the level which was consistent with adenocarcinoma.  Cells positive for CK7 and CK19 and negative for CDX2 GATA3 PAX8 and TTF-1.  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   PET CT scan showed dominant left hepatic lobe mass measuring 14.6 x 10.2 cm with an SUV of 11.4.  Porta hepatis nodes 2.2 cm with an SUV of 4.7 and adjacent gastrohepatic ligament lymph node 1.3 cm with an SUV of 4.7.   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  Tumor mutational burden not high.  PD-L1 less than 1%.  Microsatellite stable    Interval history-patient still spends most of her day resting either in bed or  sitting up.  Appetite is fair.  Weight has remained stable but she has not gained any weight.  Denies any significant abdominal pain  ECOG PS- 3 Pain scale- 0 Opioid associated constipation- no  Review of systems- Review of Systems  Constitutional:  Positive for malaise/fatigue. Negative for chills, fever and weight loss.  HENT:  Negative for congestion, ear discharge and nosebleeds.   Eyes:  Negative for blurred vision.  Respiratory:  Negative for cough, hemoptysis, sputum production, shortness of breath and wheezing.   Cardiovascular:  Negative for chest pain, palpitations, orthopnea and claudication.  Gastrointestinal:  Negative for abdominal pain, blood in stool, constipation, diarrhea, heartburn, melena, nausea and vomiting.  Genitourinary:  Negative for dysuria, flank pain, frequency, hematuria and urgency.  Musculoskeletal:  Negative for back pain, joint pain and myalgias.  Skin:  Negative for rash.  Neurological:  Negative for dizziness, tingling, focal weakness, seizures, weakness and headaches.  Endo/Heme/Allergies:  Does not bruise/bleed easily.  Psychiatric/Behavioral:  Negative for depression and suicidal ideas. The patient does not have insomnia.      Allergies  Allergen Reactions   Pollen Extract Other (See Comments)    Nose runs, eyes itch, etc     Past Medical History:  Diagnosis Date   Allergy    Anemia    Arthritis    rheumatoid /knees/hands/shoulders (Right shoulder) infusion every 5 weeks   Bladder incontinence    Chicken pox    Dyspnea    with exertion or far walking   Gastric ulcer    GERD (gastroesophageal reflux disease)    Heart murmur    Hypercholesteremia  Hyperlipidemia, unspecified    Hypertension    controlled with meds;    MDS (myelodysplastic syndrome) ([place]) 2000   Memory deficit    Myelodysplastic syndrome ([place])    Rheumatoid arthritis ([place])    Ulcer      Past Surgical History:  Procedure Laterality Date   ABDOMINAL  HYSTERECTOMY     APPENDECTOMY     as a child   CHOLECYSTECTOMY     COLONOSCOPY     COLONOSCOPY WITH PROPOFOL N/A 04/24/2017   Procedure: COLONOSCOPY WITH PROPOFOL;  Surgeon: Lollie Sails, MD;  Location: [facility] ENDOSCOPY;  Service: Endoscopy;  Laterality: N/A;   ESOPHAGOGASTRODUODENOSCOPY (EGD) WITH PROPOFOL N/A 04/24/2017   Procedure: ESOPHAGOGASTRODUODENOSCOPY (EGD) WITH PROPOFOL;  Surgeon: Lollie Sails, MD;  Location: [facility] ENDOSCOPY;  Service: Endoscopy;  Laterality: N/A;   JOINT REPLACEMENT     KNEE ARTHROPLASTY Right 02/21/2017   Procedure: COMPUTER ASSISTED TOTAL KNEE ARTHROPLASTY;  Surgeon: Dereck Leep, MD;  Location: [facility] ORS;  Service: Orthopedics;  Laterality: Right;   KNEE ARTHROPLASTY Left 08/06/2017   Procedure: COMPUTER ASSISTED TOTAL KNEE ARTHROPLASTY;  Surgeon: Dereck Leep, MD;  Location: [facility] ORS;  Service: Orthopedics;  Laterality: Left;   NM INTRAVENOUS INJECTION ([facility] HX)     receives biologic infusions every 5 weeks for rheumatoid arthritis. followed by dr. Meda Coffee at [facility] N/A 01/17/2021   Procedure: PORTA CATH INSERTION;  Surgeon: Algernon Huxley, MD;  Location: [facility] CV LAB;  Service: Cardiovascular;  Laterality: N/A;   TONSILLECTOMY     UPPER GASTROINTESTINAL ENDOSCOPY     WISDOM TOOTH EXTRACTION      Social History   Socioeconomic History   Marital status: Married    Spouse name: Not on file   Number of children: 2   Years of education: 14   Highest education level: Not on file  Occupational History   Not on file  Tobacco Use   Smoking status: Never   Smokeless tobacco: Never  Vaping Use   Vaping Use: Never used  Substance and Sexual Activity   Alcohol use: Yes    Comment: very occasional   Drug use: Never   Sexual activity: Yes  Other Topics Concern   Not on file  Social History Narrative   Not on file   Social Determinants of Health   Financial Resource Strain: Not on file  Food Insecurity:  Not on file  Transportation Needs: Not on file  Physical Activity: Not on file  Stress: Not on file  Social Connections: Not on file  Intimate Partner Violence: Not on file    Family History  Problem Relation Age of Onset   Cancer Mother    Lung cancer Mother    Lung cancer Father    Colon cancer Neg Hx    Colon polyps Neg Hx    Rectal cancer Neg Hx      Current Outpatient Medications:    acetaminophen (TYLENOL) 325 MG tablet, Take 650 mg by mouth every 6 (six) hours as needed for moderate pain or headache. , Disp: , Rfl:    Artificial Tear Solution (GENTEAL TEARS) 0.1-0.2-0.3 % SOLN, Place 1 drop into both eyes daily as needed (dry eye). , Disp: , Rfl:    aspirin EC 81 MG tablet, Take 81 mg by mouth daily., Disp: , Rfl:    atorvastatin (LIPITOR) 10 MG tablet, Take 5 mg by mouth daily. In the morning, Disp: , Rfl:    benzonatate (  TESSALON) 100 MG capsule, Take 1-2 capsules (100-200 mg total) by mouth 3 (three) times daily as needed for cough., Disp: 30 capsule, Rfl: 0   chlorpheniramine-HYDROcodone (TUSSIONEX) 10-8 MG/5ML SUER, Take 2.5-5 mLs by mouth every 12 (twelve) hours as needed for cough., Disp: 140 mL, Rfl: 0   dexamethasone (DECADRON) 4 MG tablet, Take 2 tablets (8 mg total) by mouth daily. Take daily x 3 days starting the day after cisplatin chemotherapy. Take with food., Disp: 30 tablet, Rfl: 1   diclofenac sodium (VOLTAREN) 1 % GEL, Apply 2 g topically 4 (four) times daily as needed (pain). Apply to painful joint, Disp: , Rfl:    donepezil (ARICEPT) 10 MG tablet, Take 10 mg by mouth at bedtime., Disp: , Rfl:    ferrous sulfate 325 (65 FE) MG tablet, Take 325 mg by mouth daily with breakfast., Disp: , Rfl:    fluticasone (FLONASE) 50 MCG/ACT nasal spray, Place into both nostrils daily., Disp: , Rfl:    hydrochlorothiazide (HYDRODIURIL) 12.5 MG tablet, Take 12.5 mg by mouth daily., Disp: , Rfl:    hydroxychloroquine (PLAQUENIL) 200 MG tablet, Take 200 mg by mouth daily.,  Disp: , Rfl:    lidocaine-prilocaine (EMLA) cream, Apply to affected area once, Disp: 30 g, Rfl: 3   lisinopril (ZESTRIL) 20 MG tablet, Take 20 mg by mouth daily. For BP., Disp: , Rfl:    loperamide (IMODIUM) 2 MG capsule, Take 1 capsule (2 mg total) by mouth as needed for diarrhea or loose stools., Disp: 90 capsule, Rfl: 1   LORazepam (ATIVAN) 0.5 MG tablet, Take 0.5 mg by mouth once., Disp: , Rfl:    meloxicam (MOBIC) 7.5 MG tablet, Take 7.5 mg by mouth daily. For muscle pains., Disp: , Rfl:    memantine (NAMENDA) 10 MG tablet, Take 10 mg by mouth 2 (two) times daily., Disp: , Rfl:    Menthol, Topical Analgesic, (BIOFREEZE EX), Apply 1 application topically as needed (for knee/leg pain)., Disp: , Rfl:    mirtazapine (REMERON) 7.5 MG tablet, Take 7.5 mg by mouth at bedtime., Disp: , Rfl:    Multiple Vitamins-Minerals (OCUVITE PO), Take 0.5 tablets by mouth in the morning and at bedtime., Disp: , Rfl:    ondansetron (ZOFRAN) 8 MG tablet, Take 1 tablet (8 mg total) by mouth 2 (two) times daily as needed. Start on the third day after cisplatin chemotherapy., Disp: 30 tablet, Rfl: 1   oxybutynin (DITROPAN-XL) 10 MG 24 hr tablet, Take 10 mg by mouth at bedtime. Urinary Frequency., Disp: , Rfl:    oxyCODONE (OXY IR/ROXICODONE) 5 MG immediate release tablet, Take 1-2 tablets (5-10 mg total) by mouth every 6 (six) hours as needed for severe pain., Disp: 90 tablet, Rfl: 0   pantoprazole (PROTONIX) 40 MG tablet, Take 1 tablet (40 mg total) by mouth daily., Disp: 60 tablet, Rfl: 3   potassium chloride SA (KLOR-CON M) 20 MEQ tablet, TAKE 1 TABLET BY MOUTH TWICE DAILY, Disp: 14 tablet, Rfl: 0   prochlorperazine (COMPAZINE) 10 MG tablet, Take 1 tablet (10 mg total) by mouth every 6 (six) hours as needed (Nausea or vomiting)., Disp: 30 tablet, Rfl: 1   sulfaSALAzine (AZULFIDINE) 500 MG tablet, Take 500 mg by mouth 2 (two) times daily. Anti-Inflammatory for 90 days., Disp: , Rfl:    triamcinolone cream (KENALOG)  0.5 %, Apply topically., Disp: , Rfl:  No current facility-administered medications for this visit.  Facility-Administered Medications Ordered in Other Visits:    0.9 %  sodium chloride  infusion, , Intravenous, Once, Sindy Guadeloupe, MD, Last Rate: 999 mL/hr at 03/25/21 1443, New Bag at 03/25/21 1443   0.9 %  sodium chloride infusion, , Intravenous, Once, Sindy Guadeloupe, MD   0.9 %  sodium chloride infusion, , Intravenous, Once, Sindy Guadeloupe, MD   dexamethasone (DECADRON) 10 mg in sodium chloride 0.9 % 50 mL IVPB, 10 mg, Intravenous, Once, Sindy Guadeloupe, MD   gemcitabine (GEMZAR) 1,102 mg in sodium chloride 0.9 % 250 mL chemo infusion, 700 mg/m2 (Order-Specific), Intravenous, Once, Sindy Guadeloupe, MD   heparin lock flush 100 UNIT/ML injection, , , ,   Physical exam:  Vitals:   03/25/21 1351  BP: 135/64  Pulse: 95  Resp: 17  Temp: (!) 97 F (36.1 C)  TempSrc: Tympanic  SpO2: 97%  Weight: 126 lb (57.2 kg)   Physical Exam Constitutional:      General: She is not in acute distress. Cardiovascular:     Rate and Rhythm: Normal rate and regular rhythm.     Heart sounds: Normal heart sounds.  Pulmonary:     Effort: Pulmonary effort is normal.     Breath sounds: Normal breath sounds.  Abdominal:     General: Bowel sounds are normal. There is no distension.     Palpations: Abdomen is soft.     Tenderness: There is no abdominal tenderness.  Musculoskeletal:     Right lower leg: No edema.     Left lower leg: No edema.  Skin:    General: Skin is warm and dry.  Neurological:     Mental Status: She is alert and oriented to person, place, and time.     CMP Latest Ref Rng & Units 03/25/2021  Glucose 70 - 99 mg/dL 131(H)  BUN 8 - 23 mg/dL 16  Creatinine 0.44 - 1.00 mg/dL 1.56(H)  Sodium 135 - 145 mmol/L 137  Potassium 3.5 - 5.1 mmol/L 3.6  Chloride 98 - 111 mmol/L 103  CO2 22 - 32 mmol/L 22  Calcium 8.9 - 10.3 mg/dL 8.7(L)  Total Protein 6.5 - 8.1 g/dL 6.5  Total Bilirubin 0.3 -  1.2 mg/dL 0.2(L)  Alkaline Phos 38 - 126 U/L 134(H)  AST 15 - 41 U/L 34  ALT 0 - 44 U/L 22   CBC Latest Ref Rng & Units 03/25/2021  WBC 4.0 - 10.5 K/uL 15.4(H)  Hemoglobin 12.0 - 15.0 g/dL 8.8(L)  Hematocrit 36.0 - 46.0 % 28.8(L)  Platelets 150 - 400 K/uL 224    Assessment and plan- Patient is a 81 y.o. 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  She is here for on treatment assessment prior to cycle 3-day 1 of single agent gemcitabine chemotherapy  Hemoglobin is staying stable around 8.5.  It is 8.8 today.  No need for blood transfusion today.  She will proceed with single agent gemcitabine chemotherapy at 700 mg per metered squared which she is receiving a week on week off.  She will receive cycle 3-day 15 of chemotherapy in 2 weeks  Repeat CT chest abdomen pelvis with contrast if her kidney functions permit in about 3 weeks time and I will see her back in 4 weeks for cycle 4-day 1 of gemcitabine chemotherapy with CBC with differential CMP and CA 19-9  AKI: We will give her 1 L of IV fluids today  Neoplasm related pain: Continue as needed oxycodone   Visit Diagnosis 1. Encounter for antineoplastic chemotherapy   2. AKI (acute  kidney injury) ([place])   3. Primary cholangiocarcinoma of intrahepatic bile duct ([place])      Dr. Randa Evens, MD, MPH [facility] at [facility] [phone] 03/25/2021 2:43 PM

## 2021-03-25 NOTE — Progress Notes (Signed)
MD reviewed all labs today. Creatinine elevated. Per Dr. [name], [place] to go ahead with treatment today; give 1 liter IV fluids over 1 hour prior to Gemzar treatment.  Per Dr. [name], can give the IV dexamethasone pre-med while fluids are running.

## 2021-03-25 NOTE — Addendum Note (Signed)
Addended by: Luella Cook on: 03/25/2021 04:34 PM   Modules accepted: Orders

## 2021-03-25 NOTE — Progress Notes (Signed)
Patient here for oncology follow-up appointment, expresses no complaints or concerns at this time.    

## 2021-03-26 ENCOUNTER — Emergency Department: Payer: Medicare Other

## 2021-03-26 ENCOUNTER — Emergency Department
Admission: EM | Admit: 2021-03-26 | Discharge: 2021-03-26 | Disposition: A | Discharge status: Home / Self Care | Payer: Medicare Other | Attending: Emergency Medicine | Admitting: Emergency Medicine

## 2021-03-26 ENCOUNTER — Other Ambulatory Visit: Payer: Self-pay

## 2021-03-26 DIAGNOSIS — W19XXXA Unspecified fall, initial encounter: Secondary | ICD-10-CM

## 2021-03-26 DIAGNOSIS — I10 Essential (primary) hypertension: Secondary | ICD-10-CM | POA: Diagnosis not present

## 2021-03-26 DIAGNOSIS — Z7982 Long term (current) use of aspirin: Secondary | ICD-10-CM | POA: Diagnosis not present

## 2021-03-26 DIAGNOSIS — Z96653 Presence of artificial knee joint, bilateral: Secondary | ICD-10-CM | POA: Diagnosis not present

## 2021-03-26 DIAGNOSIS — S0101XA Laceration without foreign body of scalp, initial encounter: Secondary | ICD-10-CM | POA: Insufficient documentation

## 2021-03-26 DIAGNOSIS — W1789XA Other fall from one level to another, initial encounter: Secondary | ICD-10-CM | POA: Insufficient documentation

## 2021-03-26 DIAGNOSIS — S0990XA Unspecified injury of head, initial encounter: Secondary | ICD-10-CM | POA: Diagnosis present

## 2021-03-26 DIAGNOSIS — Z8616 Personal history of COVID-19: Secondary | ICD-10-CM | POA: Insufficient documentation

## 2021-03-26 MED ORDER — LIDOCAINE-EPINEPHRINE-TETRACAINE (LET) TOPICAL GEL
3.0000 mL | Freq: Once | TOPICAL | Status: AC
  Administered 2021-03-26: 3 mL via TOPICAL
  Filled 2021-03-26: qty 3

## 2021-03-26 MED ORDER — TETANUS-DIPHTH-ACELL PERTUSSIS 5-2.5-18.5 LF-MCG/0.5 IM SUSY: 0.5000 mL | PREFILLED_SYRINGE | Freq: Once | INTRAMUSCULAR | Status: DC

## 2021-03-26 NOTE — ED Triage Notes (Signed)
Pt fell from standing position getting out of bed to use restroom when she fell striking right head. Pt with small bleeding controlled laceration noted to right earlobe.

## 2021-03-26 NOTE — ED Provider Notes (Signed)
[facility] Emergency Department Provider Note ____________________________________________  Time seen: 1100  I have reviewed the triage vital signs and the nursing notes.  HISTORY  Chief Complaint  Fall   HPI Sylvia Miller is a 81 y.o. female presents to the ER today after an unwitnessed fall in her home shortly after midnight.  Her husband reports she hit her right side of her head and had significant bleeding.  He called EMS for transport to the ED.  Patient denies headache, dizziness, vision changes, neck pain or upper/lower extremity pain.  Per husband, patient has dementia at baseline.  Her last Tdap was 08/2016.  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   Memory deficit    Myelodysplastic syndrome ([place])    Rheumatoid arthritis ([place])    Ulcer     Patient Active Problem List   Diagnosis Date Noted   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   Goals of care, counseling/discussion 01/16/2021   Hyperkalemia 08/12/2020   Abdominal pain 08/12/2020   AKI (acute kidney injury) ([place]) 07/06/2020   COVID-19 virus detected 07/06/2020   Dementia without behavioral disturbance ([place]) 07/06/2020   Protein-calorie malnutrition ([place]) 08/27/2017   S/P total knee arthroplasty 08/06/2017   Cubital canal compression syndrome, right 04/18/2017   Primary osteoarthritis of knee 03/25/2017   Status post total right knee replacement 02/21/2017   Pure hypercholesterolemia 08/17/2016   MDS (myelodysplastic syndrome) ([place]) 05/12/2016   B12  deficiency 05/04/2016   Essential hypertension 05/04/2016   Mixed stress and urge urinary incontinence 05/04/2016   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/04/2016    Past Surgical History:  Procedure Laterality Date   ABDOMINAL HYSTERECTOMY     APPENDECTOMY     as a child   CHOLECYSTECTOMY     COLONOSCOPY     COLONOSCOPY WITH PROPOFOL N/A 04/24/2017   Procedure: COLONOSCOPY WITH PROPOFOL;  Surgeon: Lollie Sails, MD;  Location: [facility] ENDOSCOPY;  Service: Endoscopy;  Laterality: N/A;   ESOPHAGOGASTRODUODENOSCOPY (EGD) WITH PROPOFOL N/A 04/24/2017   Procedure: ESOPHAGOGASTRODUODENOSCOPY (EGD) WITH PROPOFOL;  Surgeon: Lollie Sails, MD;  Location: [facility] ENDOSCOPY;  Service: Endoscopy;  Laterality: N/A;   JOINT REPLACEMENT     KNEE ARTHROPLASTY Right 02/21/2017   Procedure: COMPUTER ASSISTED TOTAL KNEE ARTHROPLASTY;  Surgeon: Dereck Leep, MD;  Location: [facility] ORS;  Service: Orthopedics;  Laterality: Right;   KNEE ARTHROPLASTY Left 08/06/2017   Procedure: COMPUTER ASSISTED TOTAL KNEE ARTHROPLASTY;  Surgeon: Dereck Leep, MD;  Location: [facility] ORS;  Service: Orthopedics;  Laterality: Left;   NM INTRAVENOUS INJECTION ([facility] HX)     receives biologic infusions every 5 weeks for rheumatoid arthritis. followed by dr. Meda Coffee at [place] N/A 01/17/2021   Procedure: PORTA CATH INSERTION;  Surgeon: Algernon Huxley, MD;  Location: [place] CV LAB;  Service: Cardiovascular;  Laterality: N/A;   TONSILLECTOMY     UPPER GASTROINTESTINAL ENDOSCOPY     WISDOM  TOOTH EXTRACTION      Prior to Admission medications   Medication Sig Start Date End Date Taking? Authorizing Provider  acetaminophen (TYLENOL) 325 MG tablet Take 650 mg by mouth every 6 (six) hours as needed for moderate pain or headache.     [provider]  Artificial Tear Solution (GENTEAL TEARS) 0.1-0.2-0.3 % SOLN Place 1 drop into both eyes daily as  needed (dry eye).     [provider]  aspirin EC 81 MG tablet Take 81 mg by mouth daily.    [provider]  atorvastatin (LIPITOR) 10 MG tablet Take 5 mg by mouth daily. In the morning    [provider]  benzonatate (TESSALON) 100 MG capsule Take 1-2 capsules (100-200 mg total) by mouth 3 (three) times daily as needed for cough. 02/21/21   Jacquelin Hawking, NP  chlorpheniramine-HYDROcodone (TUSSIONEX) 10-8 MG/5ML SUER Take 2.5-5 mLs by mouth every 12 (twelve) hours as needed for cough. 02/18/21   Borders, Kirt Boys, NP  dexamethasone (DECADRON) 4 MG tablet Take 2 tablets (8 mg total) by mouth daily. Take daily x 3 days starting the day after cisplatin chemotherapy. Take with food. 01/16/21   Sindy Guadeloupe, MD  diclofenac sodium (VOLTAREN) 1 % GEL Apply 2 g topically 4 (four) times daily as needed (pain). Apply to painful joint    [provider]  donepezil (ARICEPT) 10 MG tablet Take 10 mg by mouth at bedtime. 07/30/20   [provider]  ferrous sulfate 325 (65 FE) MG tablet Take 325 mg by mouth daily with breakfast.    [provider]  fluticasone (FLONASE) 50 MCG/ACT nasal spray Place into both nostrils daily.    [provider]  hydrochlorothiazide (HYDRODIURIL) 12.5 MG tablet Take 12.5 mg by mouth daily.    [provider]  hydroxychloroquine (PLAQUENIL) 200 MG tablet Take 200 mg by mouth daily. 03/19/21   [provider]  lidocaine-prilocaine (EMLA) cream Apply to affected area once 01/16/21   Sindy Guadeloupe, MD  lisinopril (ZESTRIL) 20 MG tablet Take 20 mg by mouth daily. For BP.    [provider]  loperamide (IMODIUM) 2 MG capsule Take 1 capsule (2 mg total) by mouth as needed for diarrhea or loose stools. 01/25/21   Verlon Au, NP  LORazepam (ATIVAN) 0.5 MG tablet Take 0.5 mg by mouth once. 01/03/21   [provider]  meloxicam (MOBIC) 7.5 MG tablet Take 7.5 mg by mouth daily. For muscle  pains.    [provider]  memantine (NAMENDA) 10 MG tablet Take 10 mg by mouth 2 (two) times daily. 05/12/19   [provider]  Menthol, Topical Analgesic, (BIOFREEZE EX) Apply 1 application topically as needed (for knee/leg pain).    [provider]  mirtazapine (REMERON) 7.5 MG tablet Take 7.5 mg by mouth at bedtime. 06/06/20   [provider]  Multiple Vitamins-Minerals (OCUVITE PO) Take 0.5 tablets by mouth in the morning and at bedtime.    [provider]  ondansetron (ZOFRAN) 8 MG tablet Take 1 tablet (8 mg total) by mouth 2 (two) times daily as needed. Start on the third day after cisplatin chemotherapy. 01/18/21   Borders, Kirt Boys, NP  oxybutynin (DITROPAN-XL) 10 MG 24 hr tablet Take 10 mg by mouth at bedtime. Urinary Frequency.    [provider]  oxyCODONE (OXY IR/ROXICODONE) 5 MG immediate release tablet Take 1-2 tablets (5-10 mg total) by mouth every 6 (six) hours as needed  for severe pain. 01/25/21   Verlon Au, NP  pantoprazole (PROTONIX) 40 MG tablet Take 1 tablet (40 mg total) by mouth daily. 01/25/21   Verlon Au, NP  potassium chloride SA (KLOR-CON M) 20 MEQ tablet TAKE 1 TABLET BY MOUTH TWICE DAILY 02/04/21   Sindy Guadeloupe, MD  prochlorperazine (COMPAZINE) 10 MG tablet Take 1 tablet (10 mg total) by mouth every 6 (six) hours as needed (Nausea or vomiting). 01/16/21   Sindy Guadeloupe, MD  sulfaSALAzine (AZULFIDINE) 500 MG tablet Take 500 mg by mouth 2 (two) times daily. Anti-Inflammatory for 90 days.    [provider]  triamcinolone cream (KENALOG) 0.5 % Apply topically. 09/09/20 09/09/21  [provider]    Allergies Pollen extract  Family History  Problem Relation Age of Onset   Cancer Mother    Lung cancer Mother    Lung cancer Father    Colon cancer Neg Hx    Colon polyps Neg Hx    Rectal cancer Neg Hx     Social History Social History   Tobacco Use   Smoking status: Never   Smokeless  tobacco: Never  Vaping Use   Vaping Use: Never used  Substance Use Topics   Alcohol use: Yes    Comment: very occasional   Drug use: Never    Review of Systems  Constitutional: Negative for fever,, chills or body aches Eyes: Negative for visual changes. Cardiovascular: Negative for chest pain or chest tightness. Respiratory: Negative for cough or shortness of breath. Musculoskeletal: Negative for neck pain Skin: Positive for laceration to right scalp. Neurological: Negative for headaches, dizziness, numbness, tingling or weakness. ____________________________________________  PHYSICAL EXAM:  VITAL SIGNS: ED Triage Vitals [03/26/21 0147]  Enc Vitals Group     BP (!) 175/72     Pulse Rate 82     Resp 16     Temp 98.2 F (36.8 C)     Temp Source Oral     SpO2 96 %     Weight 119 lb 0.8 oz (54 kg)     Height 5' (1.524 m)     Head Circumference      Peak Flow      Pain Score 0     Pain Loc      Pain Edu?      Excl. in [place]?     Constitutional: Alert and oriented. Well appearing and in no distress. Head: Dried blood noted to the right side of the scalp.  Once cleaned, 1 inch nonbleeding laceration noted. Eyes:  PERRL. Normal extraocular movements Cardiovascular: Normal rate, regular rhythm. Normal distal pulses. Respiratory: Normal respiratory effort. No wheezes/rales/rhonchi. Musculoskeletal: Normal flexion, extension, rotation to the left of the cervical spine.  Decreased rotation to the right of the cervical spine.  Strength 5/5 BUE.  Handgrips equal. Neurologic: Normal speech and language.  She is slightly confused but this is her baseline per her husband.  No gross focal neurologic deficits are appreciated.  _____________________________________________   RADIOLOGY Imaging Orders         CT HEAD WO CONTRAST (5MM)         CT Cervical Spine Wo Contrast    IMPRESSION: No acute intracranial injury. No calvarial fracture. Mild right temporoparietal scalp soft tissue  swelling.  No acute fracture or listhesis of the cervical spine.   ____________________________________________  PROCEDURES  .Marland KitchenLaceration Repair  Date/Time: 03/26/2021 12:02 PM Performed by: Jearld Fenton, NP Authorized by: Jearld Fenton, NP  Consent:    Consent obtained:  Verbal   Consent given by:  Patient and spouse   Risks, benefits, and alternatives were discussed: yes     Risks discussed:  Infection, pain and poor cosmetic result Universal protocol:    Immediately prior to procedure, a time out was called: yes     Patient identity confirmed:  Verbally with patient and arm band Anesthesia:    Anesthesia method:  Topical application   Topical anesthetic:  LET Laceration details:    Location:  Scalp   Length (cm):  2.5   Depth (mm):  2 Pre-procedure details:    Preparation:  Patient was prepped and draped in usual sterile fashion and imaging obtained to evaluate for foreign bodies Exploration:    Limited defect created (wound extended): no     Hemostasis achieved with:  LET   Imaging outcome: foreign body not noted     Wound exploration: wound explored through full range of motion     Contaminated: no   Treatment:    Area cleansed with:  Saline   Amount of cleaning:  Standard   Irrigation solution:  Sterile saline   Irrigation method:  Syringe   Visualized foreign bodies/material removed: no     Debridement:  None   Undermining:  None   Scar revision: no   Skin repair:    Repair method:  Staples   Number of staples:  3 Approximation:    Approximation:  Close Repair type:    Repair type:  Simple Post-procedure details:    Dressing:  Bulky dressing   Procedure completion:  Tolerated well, no immediate complications  INITIAL IMPRESSION / ASSESSMENT AND PLAN / ED COURSE  Head Laceration s/p Unwitnessed Fall at Home:  CT head shows mild right temporoparietal scalp soft tissue swelling without acute fracture. CT cervical spine does not show any acute  findings Laceration repaired by this provider with staples, see procedure note Tdap UTD Aftercare instructions provided Advised patient to follow-up with PCP for staple removal     ____________________________________________  FINAL CLINICAL IMPRESSION(S) / ED DIAGNOSES  Final diagnoses:  Laceration of scalp, initial encounter  Fall, initial encounter      Jearld Fenton, NP 03/26/21 1205    Blake Divine, MD 03/26/21 1539

## 2021-03-26 NOTE — Discharge Instructions (Addendum)
You were seen today for laceration of your scalp after a fall.  Your imaging was negative for any acute findings.  Your tetanus is up-to-date.  We repaired your laceration with 3 staples.  You may wash your head with soap and water but avoid aggressively scrubbing over the area of the staples.  You should follow-up with your PCP in 10 days to have the staples removed.  Return to the ER for severe headache, dizziness, increased confusion or amnesia, or uncontrollable nausea and vomiting.

## 2021-03-30 ENCOUNTER — Inpatient Hospital Stay: Payer: Medicare Other | Admitting: Occupational Therapy

## 2021-04-08 ENCOUNTER — Other Ambulatory Visit: Payer: Self-pay

## 2021-04-08 ENCOUNTER — Inpatient Hospital Stay: Payer: Medicare Other

## 2021-04-08 ENCOUNTER — Inpatient Hospital Stay: Payer: Medicare Other | Attending: Oncology

## 2021-04-08 VITALS — BP 124/71 | HR 89 | Temp 98.2°F | Resp 18 | Ht 60.0 in | Wt 127.9 lb

## 2021-04-08 DIAGNOSIS — G893 Neoplasm related pain (acute) (chronic): Secondary | ICD-10-CM | POA: Diagnosis not present

## 2021-04-08 DIAGNOSIS — C221 Intrahepatic bile duct carcinoma: Secondary | ICD-10-CM | POA: Diagnosis present

## 2021-04-08 DIAGNOSIS — Z7982 Long term (current) use of aspirin: Secondary | ICD-10-CM | POA: Diagnosis not present

## 2021-04-08 DIAGNOSIS — D649 Anemia, unspecified: Secondary | ICD-10-CM | POA: Insufficient documentation

## 2021-04-08 DIAGNOSIS — Z7952 Long term (current) use of systemic steroids: Secondary | ICD-10-CM | POA: Insufficient documentation

## 2021-04-08 DIAGNOSIS — Z5111 Encounter for antineoplastic chemotherapy: Secondary | ICD-10-CM | POA: Diagnosis not present

## 2021-04-08 DIAGNOSIS — Z79899 Other long term (current) drug therapy: Secondary | ICD-10-CM | POA: Diagnosis not present

## 2021-04-08 DIAGNOSIS — I10 Essential (primary) hypertension: Secondary | ICD-10-CM | POA: Insufficient documentation

## 2021-04-08 LAB — COMPREHENSIVE METABOLIC PANEL
ALT: 33 U/L (ref 0–44)
AST: 57 U/L — ABNORMAL HIGH (ref 15–41)
Albumin: 3.3 g/dL — ABNORMAL LOW (ref 3.5–5.0)
Alkaline Phosphatase: 263 U/L — ABNORMAL HIGH (ref 38–126)
Anion gap: 11 (ref 5–15)
BUN: 22 mg/dL (ref 8–23)
CO2: 22 mmol/L (ref 22–32)
Calcium: 8.4 mg/dL — ABNORMAL LOW (ref 8.9–10.3)
Chloride: 106 mmol/L (ref 98–111)
Creatinine, Ser: 1.22 mg/dL — ABNORMAL HIGH (ref 0.44–1.00)
GFR, Estimated: 45 mL/min — ABNORMAL LOW (ref >60)
Glucose, Bld: 126 mg/dL — ABNORMAL HIGH (ref 70–99)
Potassium: 3.3 mmol/L — ABNORMAL LOW (ref 3.5–5.1)
Sodium: 139 mmol/L (ref 135–145)
Total Bilirubin: 0.4 mg/dL (ref 0.3–1.2)
Total Protein: 6.3 g/dL — ABNORMAL LOW (ref 6.5–8.1)

## 2021-04-08 LAB — CBC WITH DIFFERENTIAL/PLATELET
Abs Immature Granulocytes: 0.09 10*3/uL — ABNORMAL HIGH (ref 0.00–0.07)
Basophils Absolute: 0.1 10*3/uL (ref 0.0–0.1)
Basophils Relative: 1 %
Eosinophils Absolute: 0.1 10*3/uL (ref 0.0–0.5)
Eosinophils Relative: 1 %
HCT: 29.8 % — ABNORMAL LOW (ref 36.0–46.0)
Hemoglobin: 9 g/dL — ABNORMAL LOW (ref 12.0–15.0)
Immature Granulocytes: 1 %
Lymphocytes Relative: 17 %
Lymphs Abs: 2.2 10*3/uL (ref 0.7–4.0)
MCH: 33.8 pg (ref 26.0–34.0)
MCHC: 30.2 g/dL (ref 30.0–36.0)
MCV: 112 fL — ABNORMAL HIGH (ref 80.0–100.0)
Monocytes Absolute: 0.8 10*3/uL (ref 0.1–1.0)
Monocytes Relative: 6 %
Neutro Abs: 9.6 10*3/uL — ABNORMAL HIGH (ref 1.7–7.7)
Neutrophils Relative %: 74 %
Platelets: 301 10*3/uL (ref 150–400)
RBC: 2.66 MIL/uL — ABNORMAL LOW (ref 3.87–5.11)
RDW: 19.4 % — ABNORMAL HIGH (ref 11.5–15.5)
WBC: 12.8 10*3/uL — ABNORMAL HIGH (ref 4.0–10.5)
nRBC: 0 % (ref 0.0–0.2)

## 2021-04-08 MED ORDER — SODIUM CHLORIDE 0.9 % IV SOLN
10.0000 mg | Freq: Once | INTRAVENOUS | Status: AC
  Administered 2021-04-08: 10 mg via INTRAVENOUS
  Filled 2021-04-08: qty 10

## 2021-04-08 MED ORDER — HEPARIN SOD (PORK) LOCK FLUSH 100 UNIT/ML IV SOLN
500.0000 [IU] | Freq: Once | INTRAVENOUS | Status: AC | PRN
  Administered 2021-04-08: 500 [IU]
  Filled 2021-04-08: qty 5

## 2021-04-08 MED ORDER — SODIUM CHLORIDE 0.9 % IV SOLN
Freq: Once | INTRAVENOUS | Status: AC
  Filled 2021-04-08: qty 250

## 2021-04-08 MED ORDER — SODIUM CHLORIDE 0.9 % IV SOLN
700.0000 mg/m2 | Freq: Once | INTRAVENOUS | Status: AC
  Administered 2021-04-08: 1102 mg via INTRAVENOUS
  Filled 2021-04-08: qty 26.3

## 2021-04-08 NOTE — Progress Notes (Signed)
Nutrition Follow-up:  Patient with stage IIIb intrahepatic cholangiocarcinoma.  Patient receiving gemcitabine  Met with husband as patient with dementia.  Husband reports that appetite is about the same.  "I can get about 2 meals out of her."  Patient likes ice cream, ice cream sandwiches, ice cream cones.  Daughter makes her shake of ensure, whipped cream and other items that husband can't remember.  Likes yogurt for breakfast, sometimes french toast.    Husband says that she denies nausea.  Abdominal pain sometimes but thinks that is related to constipation.    Medications: reviewed  Labs: reviewed  Anthropometrics:   Weight 127 lb 13. 9 oz today  126 lb on 1/20 138 lb on 01/27/21   NUTRITION DIAGNOSIS: Inadequate oral intake continues   INTERVENTION:  Encouraged husband to continue to offer high calorie, high protein food items.   Continue ensure shake//smoothie as often as she will drink.      MONITORING, EVALUATION, GOAL: weight trends, intake   NEXT VISIT: as needed with treatment  Llewellyn Choplin B. Zenia Resides, [place], [place] Registered Dietitian [phone] (mobile)

## 2021-04-08 NOTE — Patient Instructions (Signed)
[facility] CANCER CTR AT [place]  Discharge Instructions: Thank you for choosing [facility] to provide your oncology and hematology care.  If you have a lab appointment with the [facility], please go directly to the [facility] and check in at the registration area.  Wear comfortable clothing and clothing appropriate for easy access to any Portacath or PICC line.   We strive to give you quality time with your provider. You may need to reschedule your appointment if you arrive late (15 or more minutes).  Arriving late affects you and other patients whose appointments are after yours.  Also, if you miss three or more appointments without notifying the office, you may be dismissed from the clinic at the providers discretion.      For prescription refill requests, have your pharmacy contact our office and allow 72 hours for refills to be completed.    Today you received the following chemotherapy and/or immunotherapy agents GEMZAR      To help prevent nausea and vomiting after your treatment, we encourage you to take your nausea medication as directed.  BELOW ARE SYMPTOMS THAT SHOULD BE REPORTED IMMEDIATELY: *FEVER GREATER THAN 100.4 F (38 C) OR HIGHER *CHILLS OR SWEATING *NAUSEA AND VOMITING THAT IS NOT CONTROLLED WITH YOUR NAUSEA MEDICATION *UNUSUAL SHORTNESS OF BREATH *UNUSUAL BRUISING OR BLEEDING *URINARY PROBLEMS (pain or burning when urinating, or frequent urination) *BOWEL PROBLEMS (unusual diarrhea, constipation, pain near the anus) TENDERNESS IN MOUTH AND THROAT WITH OR WITHOUT PRESENCE OF ULCERS (sore throat, sores in mouth, or a toothache) UNUSUAL RASH, SWELLING OR PAIN  UNUSUAL VAGINAL DISCHARGE OR ITCHING   Items with * indicate a potential emergency and should be followed up as soon as possible or go to the Emergency Department if any problems should occur.  Please show the CHEMOTHERAPY ALERT CARD or IMMUNOTHERAPY ALERT CARD at check-in to the  Emergency Department and triage nurse.  Should you have questions after your visit or need to cancel or reschedule your appointment, please contact [facility] CANCER [place] AT [place] [address]  [phone] and follow the prompts.  Office hours are 8:00 a.m. to 4:30 p.m. Monday - Friday. Please note that voicemails left after 4:00 p.m. may not be returned until the following business day.  We are closed weekends and major holidays. You have access to a nurse at all times for urgent questions. Please call the main number to the clinic [phone] and follow the prompts.  For any non-urgent questions, you may also contact your provider using MyChart. We now offer e-Visits for anyone 5 and older to request care online for non-urgent symptoms. For details visit [email or web address].   Also download the MyChart app! Go to the app store, search "MyChart", open the app, select [facility], and log in with your MyChart username and password.  Due to Covid, a mask is required upon entering the hospital/clinic. If you do not have a mask, one will be given to you upon arrival. For doctor visits, patients may have 1 support person aged 34 or older with them. For treatment visits, patients cannot have anyone with them due to current Covid guidelines and our immunocompromised population.   Gemcitabine injection What is this medication? GEMCITABINE (jem SYE ta been) is a chemotherapy drug. This medicine is used to treat many types of cancer like breast cancer, lung cancer, pancreatic cancer, and ovarian cancer. This medicine may be used for other purposes; ask your health care provider or pharmacist if you have questions. COMMON  BRAND NAME(S): Gemzar, Infugem What should I tell my care team before I take this medication? They need to know if you have any of these conditions: blood disorders infection kidney disease liver disease lung or breathing disease, like asthma recent or ongoing radiation  therapy an unusual or allergic reaction to gemcitabine, other chemotherapy, other medicines, foods, dyes, or preservatives pregnant or trying to get pregnant breast-feeding How should I use this medication? This drug is given as an infusion into a vein. It is administered in a hospital or clinic by a specially trained health care professional. Talk to your pediatrician regarding the use of this medicine in children. Special care may be needed. Overdosage: If you think you have taken too much of this medicine contact a poison control center or emergency room at once. NOTE: This medicine is only for you. Do not share this medicine with others. What if I miss a dose? It is important not to miss your dose. Call your doctor or health care professional if you are unable to keep an appointment. What may interact with this medication? medicines to increase blood counts like filgrastim, pegfilgrastim, sargramostim some other chemotherapy drugs like cisplatin vaccines Talk to your doctor or health care professional before taking any of these medicines: acetaminophen aspirin ibuprofen ketoprofen naproxen This list may not describe all possible interactions. Give your health care provider a list of all the medicines, herbs, non-prescription drugs, or dietary supplements you use. Also tell them if you smoke, drink alcohol, or use illegal drugs. Some items may interact with your medicine. What should I watch for while using this medication? Visit your doctor for checks on your progress. This drug may make you feel generally unwell. This is not uncommon, as chemotherapy can affect healthy cells as well as cancer cells. Report any side effects. Continue your course of treatment even though you feel ill unless your doctor tells you to stop. In some cases, you may be given additional medicines to help with side effects. Follow all directions for their use. Call your doctor or health care professional for  advice if you get a fever, chills or sore throat, or other symptoms of a cold or flu. Do not treat yourself. This drug decreases your body's ability to fight infections. Try to avoid being around people who are sick. This medicine may increase your risk to bruise or bleed. Call your doctor or health care professional if you notice any unusual bleeding. Be careful brushing and flossing your teeth or using a toothpick because you may get an infection or bleed more easily. If you have any dental work done, tell your dentist you are receiving this medicine. Avoid taking products that contain aspirin, acetaminophen, ibuprofen, naproxen, or ketoprofen unless instructed by your doctor. These medicines may hide a fever. Do not become pregnant while taking this medicine or for 6 months after stopping it. Women should inform their doctor if they wish to become pregnant or think they might be pregnant. Men should not father a child while taking this medicine and for 3 months after stopping it. There is a potential for serious side effects to an unborn child. Talk to your health care professional or pharmacist for more information. Do not breast-feed an infant while taking this medicine or for at least 1 week after stopping it. Men should inform their doctors if they wish to father a child. This medicine may lower sperm counts. Talk with your doctor or health care professional if you are concerned about  your fertility. What side effects may I notice from receiving this medication? Side effects that you should report to your doctor or health care professional as soon as possible: allergic reactions like skin rash, itching or hives, swelling of the face, lips, or tongue breathing problems pain, redness, or irritation at site where injected signs and symptoms of a dangerous change in heartbeat or heart rhythm like chest pain; dizziness; fast or irregular heartbeat; palpitations; feeling faint or lightheaded, falls;  breathing problems signs of decreased platelets or bleeding - bruising, pinpoint red spots on the skin, black, tarry stools, blood in the urine signs of decreased red blood cells - unusually weak or tired, feeling faint or lightheaded, falls signs of infection - fever or chills, cough, sore throat, pain or difficulty passing urine signs and symptoms of kidney injury like trouble passing urine or change in the amount of urine signs and symptoms of liver injury like dark yellow or brown urine; general ill feeling or flu-like symptoms; light-colored stools; loss of appetite; nausea; right upper belly pain; unusually weak or tired; yellowing of the eyes or skin swelling of ankles, feet, hands Side effects that usually do not require medical attention (report to your doctor or health care professional if they continue or are bothersome): constipation diarrhea hair loss loss of appetite nausea rash vomiting This list may not describe all possible side effects. Call your doctor for medical advice about side effects. You may report side effects to FDA at 1-800-FDA-1088. Where should I keep my medication? This drug is given in a hospital or clinic and will not be stored at home. NOTE: This sheet is a summary. It may not cover all possible information. If you have questions about this medicine, talk to your doctor, pharmacist, or health care provider.  2022 Elsevier/Gold Standard (2017-05-16 00:00:00)

## 2021-04-13 ENCOUNTER — Ambulatory Visit
Admission: RE | Admit: 2021-04-13 | Discharge: 2021-04-13 | Disposition: A | Discharge status: Home / Self Care | Payer: Medicare Other | Source: Ambulatory Visit | Attending: Oncology | Admitting: Oncology

## 2021-04-13 DIAGNOSIS — C221 Intrahepatic bile duct carcinoma: Secondary | ICD-10-CM | POA: Insufficient documentation

## 2021-04-13 MED ORDER — IOHEXOL 300 MG/ML  SOLN
75.0000 mL | Freq: Once | INTRAMUSCULAR | Status: AC | PRN
  Administered 2021-04-13: 75 mL via INTRAVENOUS

## 2021-04-15 ENCOUNTER — Telehealth: Payer: Self-pay | Admitting: *Deleted

## 2021-04-15 NOTE — Telephone Encounter (Signed)
Mr Michelin called asking for results of CT done Wednesday and would like a return call.  Her next appointment is 04/22/21  IMPRESSION: 1. Interval enlargement of a large, heterogeneously enhancing mass occupying the majority of the left lobe of the liver. Additional satellite lesions are enlarged as well. Findings are consistent with worsened cholangiocarcinoma. 2. Unchanged enlarged porta hepatis lymph nodes, previously FDG avid and consistent with nodal metastases. 3. No evidence of metastatic disease in the chest. 4. Status post cholecystectomy with probable postoperative biliary ductal dilatation.   Aortic Atherosclerosis (ICD10-I70.0).     Electronically Signed   By: Delanna Ahmadi M.D.   On: 04/13/2021 15:40

## 2021-04-18 NOTE — Telephone Encounter (Signed)
Pt and husband came today and wants results of ct scan, we do not have any openings today to get her ct scan results,but she can be seen on tues at 8:30 am, husband accepted that appt.we will cancel the Friday appt. Which was originally made to get results.

## 2021-04-19 ENCOUNTER — Other Ambulatory Visit: Payer: Self-pay

## 2021-04-19 ENCOUNTER — Encounter: Payer: Self-pay | Admitting: Oncology

## 2021-04-19 ENCOUNTER — Inpatient Hospital Stay (HOSPITAL_BASED_OUTPATIENT_CLINIC_OR_DEPARTMENT_OTHER): Payer: Medicare Other | Admitting: Oncology

## 2021-04-19 VITALS — BP 153/74 | HR 90 | Resp 16 | Ht 60.0 in | Wt 122.0 lb

## 2021-04-19 DIAGNOSIS — C221 Intrahepatic bile duct carcinoma: Secondary | ICD-10-CM

## 2021-04-19 DIAGNOSIS — Z7189 Other specified counseling: Secondary | ICD-10-CM | POA: Diagnosis not present

## 2021-04-19 DIAGNOSIS — Z5111 Encounter for antineoplastic chemotherapy: Secondary | ICD-10-CM | POA: Diagnosis not present

## 2021-04-19 MED ORDER — OXYCODONE HCL 5 MG PO TABS: 5.0000 mg | ORAL_TABLET | Freq: Four times a day (QID) | ORAL | 0 refills | Status: DC | PRN

## 2021-04-19 NOTE — Progress Notes (Signed)
Hematology/Oncology Consult note [facility]  [phone] [phone]  Patient Care Team: Dion Body, MD as PCP - General (Family Medicine) Clent Jacks, RN as Oncology Nurse Navigator Borders, Kirt Boys, NP as Nurse Practitioner (Hospice and Palliative Medicine) Sindy Guadeloupe, MD as Consulting Physician (Oncology) Quintin Alto, MD as Consulting Physician (Rheumatology)   Name of the patient: Sylvia Miller  [id number]  [birth year]   Date of visit: 04/19/21  Diagnosis- locally advanced cholangiocarcinoma  Chief complaint/ Reason for visit-we will discuss CT scan results and further management  Heme/Onc history: patient is a 81 year old female who was seen by [facility] forSymptoms of epigastric pain and weight loss.  She had a CT scan of the abdomen which showed 12.2 cm liver lesion as compared to 6.8 cm lesion in 2021 and 2.2 cm lesion in 2019.  This was followed by a repeat CT scan in September 2022 which showed further enlargement of the mass to 13.7 x 9.2 cm which favors primary pancreaticobiliary versus hepatic malignancy.  Moderate periportal adenopathy.  Patient had an ultrasound-guided biopsy of the level which was consistent with adenocarcinoma.  Cells positive for CK7 and CK19 and negative for CDX2 GATA3 PAX8 and TTF-1.  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   PET CT scan showed dominant left hepatic lobe mass measuring 14.6 x 10.2 cm with an SUV of 11.4.  Porta hepatis nodes 2.2 cm with an SUV of 4.7 and adjacent gastrohepatic ligament lymph node 1.3 cm with an SUV of 4.7.   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  Tumor mutational burden not high.  PD-L1 less than 1%.  Microsatellite stable    Interval history-patient has been having progressive fatigue.  She still spends most of her day in bed.  Has on and  off abdominal pain.  Occasional diarrhea  ECOG PS- 3 Pain scale- 3 Opioid associated constipation- no  Review of systems- Review of Systems  Constitutional:  Positive for malaise/fatigue. Negative for chills, fever and weight loss.  HENT:  Negative for congestion, ear discharge and nosebleeds.   Eyes:  Negative for blurred vision.  Respiratory:  Negative for cough, hemoptysis, sputum production, shortness of breath and wheezing.   Cardiovascular:  Negative for chest pain, palpitations, orthopnea and claudication.  Gastrointestinal:  Positive for abdominal pain. Negative for blood in stool, constipation, diarrhea, heartburn, melena, nausea and vomiting.  Genitourinary:  Negative for dysuria, flank pain, frequency, hematuria and urgency.  Musculoskeletal:  Negative for back pain, joint pain and myalgias.  Skin:  Negative for rash.  Neurological:  Negative for dizziness, tingling, focal weakness, seizures, weakness and headaches.  Endo/Heme/Allergies:  Does not bruise/bleed easily.  Psychiatric/Behavioral:  Negative for depression and suicidal ideas. The patient does not have insomnia.      Allergies  Allergen Reactions   Pollen Extract Other (See Comments)    Nose runs, eyes itch, etc     Past Medical History:  Diagnosis Date   Allergy    Anemia    Arthritis    rheumatoid /knees/hands/shoulders (Right shoulder) infusion every 5 weeks   Bladder incontinence    Chicken pox    Dyspnea    with exertion or far walking   Gastric ulcer    GERD (gastroesophageal reflux disease)    Heart murmur    Hypercholesteremia    Hyperlipidemia, unspecified    Hypertension    controlled with  meds;    MDS (myelodysplastic syndrome) ([place]) 2000   Memory deficit    Myelodysplastic syndrome ([place])    Rheumatoid arthritis ([place])    Ulcer      Past Surgical History:  Procedure Laterality Date   ABDOMINAL HYSTERECTOMY     APPENDECTOMY     as a child   CHOLECYSTECTOMY     COLONOSCOPY      COLONOSCOPY WITH PROPOFOL N/A 04/24/2017   Procedure: COLONOSCOPY WITH PROPOFOL;  Surgeon: Lollie Sails, MD;  Location: [facility] END[facility]COPY;  Service: Endoscopy;  Laterality: N/A;   ESOPHAGOGASTRODUODENOSCOPY (EGD) WITH PROPOFOL N/A 04/24/2017   Procedure: ESOPHAGOGASTRODUODENOSCOPY (EGD) WITH PROPOFOL;  Surgeon: Lollie Sails, MD;  [facility] ENDOSCOPY;  Service: Endoscopy;  Laterality: N/A;   JOINT REPLACEMENT     KNEE ARTHROPLASTY Right 02/21/2017   Procedure: COMPUTER ASSISTED TOTAL KNEE ARTHROPLASTY;  Surgeon: Dereck Leep, MD;  Location: [facility] ORS;  Service: Orthopedics;  Laterality: Right;   KNEE ARTHROPLASTY Left 08/06/2017   Procedure: COMPUTER ASSISTED TOTAL KNEE ARTHROPLASTY;  Surgeon: Dereck Leep, MD;  Location: [facility] ORS;  Service: Orthopedics;  Laterality: Left;   NM INTRAVENOUS INJECTION ([facility] HX)     receives biologic infusions every 5 weeks for rheumatoid arthritis. followed by dr. Meda Coffee at [facility] N/A 01/17/2021   Procedure: PORTA CATH INSERTION;  Surgeon: Algernon Huxley, MD;  Location: [place] CV LAB;  Service: Cardiovascular;  Laterality: N/A;   TONSILLECTOMY     UPPER GASTROINTESTINAL ENDOSCOPY     WISDOM TOOTH EXTRACTION      Social History   Socioeconomic History   Marital status: Married    Spouse name: Not on file   Number of children: 2   Years of education: 14   Highest education level: Not on file  Occupational History   Not on file  Tobacco Use   Smoking status: Never   Smokeless tobacco: Never  Vaping Use   Vaping Use: Never used  Substance and Sexual Activity   Alcohol use: Yes    Comment: very occasional   Drug use: Never   Sexual activity: Yes  Other Topics Concern   Not on file  Social History Narrative   Not on file   Social Determinants of Health   Financial Resource Strain: Not on file  Food Insecurity: Not on file  Transportation Needs: Not on file  Physical Activity: Not on file   Stress: Not on file  Social Connections: Not on file  Intimate Partner Violence: Not on file    Family History  Problem Relation Age of Onset   Cancer Mother    Lung cancer Mother    Lung cancer Father    Colon cancer Neg Hx    Colon polyps Neg Hx    Rectal cancer Neg Hx      Current Outpatient Medications:    acetaminophen (TYLENOL) 325 MG tablet, Take 650 mg by mouth every 6 (six) hours as needed for moderate pain or headache. , Disp: , Rfl:    Artificial Tear Solution (GENTEAL TEARS) 0.1-0.2-0.3 % SOLN, Place 1 drop into both eyes daily as needed (dry eye). , Disp: , Rfl:    aspirin EC 81 MG tablet, Take 81 mg by mouth daily., Disp: , Rfl:    dexamethasone (DECADRON) 4 MG tablet, Take 2 tablets (8 mg total) by mouth daily. Take daily x 3 days starting the day after cisplatin chemotherapy. Take with food., Disp: 30 tablet, Rfl:  1   diclofenac sodium (VOLTAREN) 1 % GEL, Apply 2 g topically 4 (four) times daily as needed (pain). Apply to painful joint, Disp: , Rfl:    donepezil (ARICEPT) 10 MG tablet, Take 10 mg by mouth at bedtime., Disp: , Rfl:    ferrous sulfate 325 (65 FE) MG tablet, Take 325 mg by mouth daily with breakfast., Disp: , Rfl:    hydrochlorothiazide (HYDRODIURIL) 12.5 MG tablet, Take 12.5 mg by mouth daily., Disp: , Rfl:    hydroxychloroquine (PLAQUENIL) 200 MG tablet, Take 200 mg by mouth daily., Disp: , Rfl:    lidocaine-prilocaine (EMLA) cream, Apply to affected area once, Disp: 30 g, Rfl: 3   loperamide (IMODIUM) 2 MG capsule, Take 1 capsule (2 mg total) by mouth as needed for diarrhea or loose stools., Disp: 90 capsule, Rfl: 1   LORazepam (ATIVAN) 0.5 MG tablet, Take 0.5 mg by mouth once., Disp: , Rfl:    meloxicam (MOBIC) 7.5 MG tablet, Take 7.5 mg by mouth daily. For muscle pains., Disp: , Rfl:    memantine (NAMENDA) 10 MG tablet, Take 10 mg by mouth 2 (two) times daily., Disp: , Rfl:    Menthol, Topical Analgesic, (BIOFREEZE EX), Apply 1 application topically  as needed (for knee/leg pain)., Disp: , Rfl:    mirtazapine (REMERON) 7.5 MG tablet, Take 7.5 mg by mouth at bedtime., Disp: , Rfl:    Multiple Vitamins-Minerals (OCUVITE PO), Take 0.5 tablets by mouth in the morning and at bedtime., Disp: , Rfl:    ondansetron (ZOFRAN) 8 MG tablet, Take 1 tablet (8 mg total) by mouth 2 (two) times daily as needed. Start on the third day after cisplatin chemotherapy., Disp: 30 tablet, Rfl: 1   oxybutynin (DITROPAN-XL) 10 MG 24 hr tablet, Take 10 mg by mouth at bedtime. Urinary Frequency., Disp: , Rfl:    pantoprazole (PROTONIX) 40 MG tablet, Take 1 tablet (40 mg total) by mouth daily., Disp: 60 tablet, Rfl: 3   potassium chloride SA (KLOR-CON M) 20 MEQ tablet, TAKE 1 TABLET BY MOUTH TWICE DAILY, Disp: 14 tablet, Rfl: 0   prochlorperazine (COMPAZINE) 10 MG tablet, Take 1 tablet (10 mg total) by mouth every 6 (six) hours as needed (Nausea or vomiting)., Disp: 30 tablet, Rfl: 1   sulfaSALAzine (AZULFIDINE) 500 MG tablet, Take 500 mg by mouth 2 (two) times daily. Anti-Inflammatory for 90 days., Disp: , Rfl:    triamcinolone cream (KENALOG) 0.5 %, Apply topically., Disp: , Rfl:    atorvastatin (LIPITOR) 10 MG tablet, Take 5 mg by mouth daily. In the morning (Patient not taking: Reported on 04/19/2021), Disp: , Rfl:    lisinopril (ZESTRIL) 20 MG tablet, Take 20 mg by mouth daily. For BP. (Patient not taking: Reported on 04/19/2021), Disp: , Rfl:    oxyCODONE (OXY IR/ROXICODONE) 5 MG immediate release tablet, Take 1-2 tablets (5-10 mg total) by mouth every 6 (six) hours as needed for severe pain., Disp: 90 tablet, Rfl: 0 No current facility-administered medications for this visit.  Facility-Administered Medications Ordered in Other Visits:    heparin lock flush 100 UNIT/ML injection, , , ,   Physical exam:  Vitals:   04/19/21 0826  BP: (!) 153/74  Pulse: 90  Resp: 16  SpO2: 100%  Weight: 122 lb (55.3 kg)  Height: 5' (1.524 m)   Physical Exam Constitutional:       Comments: Sitting in a wheelchair and appears in no acute distress  Cardiovascular:     Rate and Rhythm: Normal  rate and regular rhythm.     Heart sounds: Normal heart sounds.  Pulmonary:     Effort: Pulmonary effort is normal.     Breath sounds: Normal breath sounds.  Abdominal:     General: Bowel sounds are normal.     Palpations: Abdomen is soft.  Skin:    General: Skin is warm and dry.  Neurological:     Mental Status: She is alert and oriented to person, place, and time.     CMP Latest Ref Rng & Units 04/08/2021  Glucose 70 - 99 mg/dL 126(H)  BUN 8 - 23 mg/dL 22  Creatinine 0.44 - 1.00 mg/dL 1.22(H)  Sodium 135 - 145 mmol/L 139  Potassium 3.5 - 5.1 mmol/L 3.3(L)  Chloride 98 - 111 mmol/L 106  CO2 22 - 32 mmol/L 22  Calcium 8.9 - 10.3 mg/dL 8.4(L)  Total Protein 6.5 - 8.1 g/dL 6.3(L)  Total Bilirubin 0.3 - 1.2 mg/dL 0.4  Alkaline Phos 38 - 126 U/L 263(H)  AST 15 - 41 U/L 57(H)  ALT 0 - 44 U/L 33   CBC Latest Ref Rng & Units 04/08/2021  WBC 4.0 - 10.5 K/uL 12.8(H)  Hemoglobin 12.0 - 15.0 g/dL 9.0(L)  Hematocrit 36.0 - 46.0 % 29.8(L)  Platelets 150 - 400 K/uL 301    No images are attached to the encounter.  CT HEAD WO CONTRAST (5MM)  Result Date: 03/26/2021 CLINICAL DATA:  Head trauma, minor (Age >= 65y) Head trauma, intracranial venous injury suspected; Neck trauma (Age >= 65y). Fall from standing position, head injury. EXAM: CT HEAD WITHOUT CONTRAST CT CERVICAL SPINE WITHOUT CONTRAST TECHNIQUE: Multidetector CT imaging of the head and cervical spine was performed following the standard protocol without intravenous contrast. Multiplanar CT image reconstructions of the cervical spine were also generated. RADIATION DOSE REDUCTION: This exam was performed according to the departmental dose-optimization program which includes automated exposure control, adjustment of the mA and/or kV according to patient size and/or use of iterative reconstruction technique. COMPARISON:  None.  FINDINGS: CT HEAD FINDINGS Brain: Normal anatomic configuration. Moderate parenchymal volume loss is commensurate with the patient's age. Mild periventricular white matter changes are present likely reflecting the sequela of small vessel ischemia. No abnormal intra or extra-axial mass lesion or fluid collection. No abnormal mass effect or midline shift. No evidence of acute intracranial hemorrhage or infarct. Ventricular size is normal. Cerebellum unremarkable. Vascular: No asymmetric hyperdense vasculature at the skull base. Skull: Intact Sinuses/Orbits: Paranasal sinuses are clear. Orbits are unremarkable. Other: Mastoid air cells and middle ear cavities are clear. Mild right temporoparietal scalp soft tissue swelling and subcutaneous gas noted in keeping with history of trauma. CT CERVICAL SPINE FINDINGS Alignment: 3 mm anterolisthesis of C3-4 and C7-T1 and minimal retrolisthesis of C4-5, C5-6, and C6-7 is likely degenerative in nature. Skull base and vertebrae: Craniocervical alignment is normal. The atlantodental interval is not widened. Degenerative changes are noted at the atlantodental articulation. There is ankylosis of the facets bilaterally at C2-3 and C4-5. No acute fracture of the cervical spine. Vertebral body height is preserved. Soft tissues and spinal canal: Posterior disc osteophyte complex at C4-5, C5-6, and C6-7 in keeping with retrolisthesis at these levels result in moderate central canal stenosis with abutment and flattening of the thecal sac. Minimal anterior-posterior canal diameter is approximately 6 mm. No prevertebral soft tissue swelling. No paraspinal fluid collection. No pathologic adenopathy. Visualized thyroid is unremarkable. Right internal jugular chest port is partially visualized. Disc levels: There is diffuse intervertebral  disc space narrowing and endplate remodeling throughout the cervical spine in keeping with changes of diffuse severe degenerative disc disease. Prevertebral  soft tissues are not thickened. Review of the axial images demonstrates multilevel advanced uncovertebral and facet arthrosis resulting in multilevel severe bilateral neuroforaminal narrowing. Upper chest: Unremarkable Other: None IMPRESSION: No acute intracranial injury. No calvarial fracture. Mild right temporoparietal scalp soft tissue swelling. No acute fracture or listhesis of the cervical spine. Electronically Signed   By: Fidela Salisbury M.D.   On: 03/26/2021 02:25   CT Cervical Spine Wo Contrast  Result Date: 03/26/2021 CLINICAL DATA:  Head trauma, minor (Age >= 65y) Head trauma, intracranial venous injury suspected; Neck trauma (Age >= 65y). Fall from standing position, head injury. EXAM: CT HEAD WITHOUT CONTRAST CT CERVICAL SPINE WITHOUT CONTRAST TECHNIQUE: Multidetector CT imaging of the head and cervical spine was performed following the standard protocol without intravenous contrast. Multiplanar CT image reconstructions of the cervical spine were also generated. RADIATION DOSE REDUCTION: This exam was performed according to the departmental dose-optimization program which includes automated exposure control, adjustment of the mA and/or kV according to patient size and/or use of iterative reconstruction technique. COMPARISON:  None. FINDINGS: CT HEAD FINDINGS Brain: Normal anatomic configuration. Moderate parenchymal volume loss is commensurate with the patient's age. Mild periventricular white matter changes are present likely reflecting the sequela of small vessel ischemia. No abnormal intra or extra-axial mass lesion or fluid collection. No abnormal mass effect or midline shift. No evidence of acute intracranial hemorrhage or infarct. Ventricular size is normal. Cerebellum unremarkable. Vascular: No asymmetric hyperdense vasculature at the skull base. Skull: Intact Sinuses/Orbits: Paranasal sinuses are clear. Orbits are unremarkable. Other: Mastoid air cells and middle ear cavities are clear. Mild  right temporoparietal scalp soft tissue swelling and subcutaneous gas noted in keeping with history of trauma. CT CERVICAL SPINE FINDINGS Alignment: 3 mm anterolisthesis of C3-4 and C7-T1 and minimal retrolisthesis of C4-5, C5-6, and C6-7 is likely degenerative in nature. Skull base and vertebrae: Craniocervical alignment is normal. The atlantodental interval is not widened. Degenerative changes are noted at the atlantodental articulation. There is ankylosis of the facets bilaterally at C2-3 and C4-5. No acute fracture of the cervical spine. Vertebral body height is preserved. Soft tissues and spinal canal: Posterior disc osteophyte complex at C4-5, C5-6, and C6-7 in keeping with retrolisthesis at these levels result in moderate central canal stenosis with abutment and flattening of the thecal sac. Minimal anterior-posterior canal diameter is approximately 6 mm. No prevertebral soft tissue swelling. No paraspinal fluid collection. No pathologic adenopathy. Visualized thyroid is unremarkable. Right internal jugular chest port is partially visualized. Disc levels: There is diffuse intervertebral disc space narrowing and endplate remodeling throughout the cervical spine in keeping with changes of diffuse severe degenerative disc disease. Prevertebral soft tissues are not thickened. Review of the axial images demonstrates multilevel advanced uncovertebral and facet arthrosis resulting in multilevel severe bilateral neuroforaminal narrowing. Upper chest: Unremarkable Other: None IMPRESSION: No acute intracranial injury. No calvarial fracture. Mild right temporoparietal scalp soft tissue swelling. No acute fracture or listhesis of the cervical spine. Electronically Signed   By: Fidela Salisbury M.D.   On: 03/26/2021 02:25   CT CHEST ABDOMEN PELVIS W CONTRAST  Result Date: 04/13/2021 CLINICAL DATA:  Locally advanced cholangiocarcinoma EXAM: CT CHEST, ABDOMEN, AND PELVIS WITH CONTRAST TECHNIQUE: Multidetector CT imaging of  the chest, abdomen and pelvis was performed following the standard protocol during bolus administration of intravenous contrast. RADIATION DOSE REDUCTION: This exam was  performed according to the departmental dose-optimization program which includes automated exposure control, adjustment of the mA and/or kV according to patient size and/or use of iterative reconstruction technique. CONTRAST:  73mL OMNIPAQUE IOHEXOL 300 MG/ML  SOLN COMPARISON:  PET-CT, 01/04/2021, CT abdomen pelvis, 11/24/2020 FINDINGS: CT CHEST FINDINGS Cardiovascular: Right chest port catheter. Aortic atherosclerosis. Normal heart size. No pericardial effusion. Mediastinum/Nodes: No enlarged mediastinal, hilar, or axillary lymph nodes. Thyroid gland, trachea, and esophagus demonstrate no significant findings. Lungs/Pleura: Bandlike scarring of the bilateral lung bases. No pleural effusion or pneumothorax. Musculoskeletal: No chest wall mass or suspicious osseous lesions identified. CT ABDOMEN PELVIS FINDINGS Hepatobiliary: Interval enlargement of a large, heterogeneously enhancing mass occupying the majority of the left lobe of the liver, measuring 15.6 x 9.7 cm, previously 13.7 x 9.2 cm when measured similarly (series 2, image 44). Additional satellite lesions are present, an index lesion of the inferior left lobe of the liver, hepatic segment 3, measuring 2.4 x 2.0 cm, previously 1.1 x 0.8 cm (series 2, image 56). Status post cholecystectomy. Unchanged intra and extrahepatic biliary ductal dilatation to the ampulla, most likely post cholecystectomy. Pancreas: Unremarkable. No pancreatic ductal dilatation or surrounding inflammatory changes. Spleen: Normal in size without significant abnormality. Adrenals/Urinary Tract: Adrenal glands are unremarkable. Kidneys are normal, without renal calculi, solid lesion, or hydronephrosis. Bladder is unremarkable. Stomach/Bowel: Stomach is within normal limits. Appendix appears normal. No evidence of bowel  wall thickening, distention, or inflammatory changes. Pancolonic diverticulosis, severe in the sigmoid. Vascular/Lymphatic: Aortic atherosclerosis. Enlarged porta hepatis lymph nodes are unchanged, largest measuring 2.9 x 2.2 cm (series 2, image 51). Reproductive: Status post hysterectomy. Other: No abdominal wall hernia or abnormality. No ascites. Musculoskeletal: No acute osseous findings. IMPRESSION: 1. Interval enlargement of a large, heterogeneously enhancing mass occupying the majority of the left lobe of the liver. Additional satellite lesions are enlarged as well. Findings are consistent with worsened cholangiocarcinoma. 2. Unchanged enlarged porta hepatis lymph nodes, previously FDG avid and consistent with nodal metastases. 3. No evidence of metastatic disease in the chest. 4. Status post cholecystectomy with probable postoperative biliary ductal dilatation. Aortic Atherosclerosis (ICD10-I70.0). Electronically Signed   By: Delanna Ahmadi M.D.   On: 04/13/2021 15:40     Assessment and plan- Patient is a 81 y.o. 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  She is here to discuss CT scan results and further management   I have reviewed CT chest abdomen and pelvis images independently and discussed findings with the patient and her husband.  Overall there has been disease progression on single agent gemcitabine chemotherapy.  Liver mass which was previously 13.7 cm is now 15.6 cm.  Satellite lesions in the liver are also larger.  Porta hepatis lymph nodes unchanged.  We have been unable to do doublet chemotherapy with gemcitabine and cisplatin due to patient's borderline performance status as well as worsening anemia which has required [place] to reduce the dose of gemcitabine to 700 mg per metered square on a biweekly regimen.  Discussed with the patient and her husband that, although second-line chemotherapy with infusional 5-FU can be tried response rates are usually lower  than first-line gemcitabine.  Overall survival with 5-FU chemotherapy is about 6 months at best.  Patient's performance status is borderline and therefore it would be reasonable not to proceed with any further chemotherapy but consider best supportive care/hospice.  Infusional 5-FU chemotherapy is typically given every 2 weeks until progression or toxicity and requires patient to carry a chemo  pump which infuses 5-FU over 2 days and she needs to come after that to get her pump disconnected.  Discussed risks benefits of 5-FU chemotherapy including all but not limited to nausea, vomiting, low blood counts, risk of infections and hospitalization.  Patient's husband will be discussing with his entire family and I will also call her daughter [name] later today.  Patient's overall prognosis is poor likely 3 to 6 months without further treatment.  Neoplasm related pain: I have renewed her as needed oxycodone   Visit Diagnosis 1. Primary cholangiocarcinoma of intrahepatic bile duct ([place])   2. Goals of care, counseling/discussion      Dr. Randa Evens, MD, MPH [facility] at [facility] [phone] 04/19/2021 12:53 PM

## 2021-04-20 ENCOUNTER — Encounter: Payer: Self-pay | Admitting: Oncology

## 2021-04-22 ENCOUNTER — Inpatient Hospital Stay: Payer: Medicare Other | Admitting: Oncology

## 2021-04-22 ENCOUNTER — Inpatient Hospital Stay: Payer: Medicare Other

## 2021-04-25 ENCOUNTER — Inpatient Hospital Stay (HOSPITAL_BASED_OUTPATIENT_CLINIC_OR_DEPARTMENT_OTHER): Payer: Medicare Other | Admitting: Hospice and Palliative Medicine

## 2021-04-25 DIAGNOSIS — C221 Intrahepatic bile duct carcinoma: Secondary | ICD-10-CM

## 2021-04-25 NOTE — Progress Notes (Signed)
Virtual Visit via Telephone Note  I connected with Sylvia Miller on 04/25/21 at  1:00 PM EST by telephone and verified that I am speaking with the correct person using two identifiers.  Location: Patient: Home Provider: Clinic   I discussed the limitations, risks, security and privacy concerns of performing an evaluation and management service by telephone and the availability of in person appointments. I also discussed with the patient that there may be a patient responsible charge related to this service. The patient expressed understanding and agreed to proceed.   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  Patient is s/p systemic chemotherapy with single agent gemcitabine, given concern for possible intolerance of a more aggressive chemotherapy regimen.   Observations/Objective: I spoke with patient and husband today by phone.  Patient was ordered around 04/19/2021 at which time imaging was reviewed revealing overall disease progression.  Second line chemotherapy with 5-FU was offered although it was felt to likely have poor response rate.  Best supportive care/hospice was also offered.  Today, patient's husband says that family has decided not to pursue further treatment and instead just focus on keeping her comfortable.  He said he was not opposed to the idea of hospice but was unsure if she would need much of their care at the present time.  I encouraged him to have hospice involved sooner than later given expected decline.  He asked for me to reach out to his daughter.  I called his daughter, Sylvia Miller, and left a voicemail.  Assessment and Plan: Intrahepatic cholangiocarcinoma -status post chemotherapy with disease progression.  Recommend hospice involvement.   Follow Up  Instructions: Follow-up telephone visit 2 weeks   I discussed the assessment and treatment plan with the patient. The patient was provided an opportunity to ask questions and all were answered. The patient agreed with the plan and demonstrated an understanding of the instructions.   The patient was advised to call back or seek an in-person evaluation if the symptoms worsen or if the condition fails to improve as anticipated.  I provided 10 minutes of non-face-to-face time during this encounter.   [name], NP

## 2021-04-27 ENCOUNTER — Telehealth: Payer: Self-pay

## 2021-04-27 NOTE — Telephone Encounter (Signed)
Call to patient home and cell to get palliative appt scheduled, No answer and unable to leave message.  Message Oncology team.  Will continue to try and reach patient.

## 2021-05-04 ENCOUNTER — Telehealth: Payer: Self-pay | Admitting: Nurse Practitioner

## 2021-05-04 NOTE — Telephone Encounter (Signed)
Spoke with patient's husband Sylvia Miller regarding the Palliative referral/services and all questions were answered and he was in agreement with scheduling visit.  I have scheduled a Sylvia Miller for 05/10/21 @ 12:30 PM ?

## 2021-05-09 ENCOUNTER — Inpatient Hospital Stay: Payer: Medicare Other | Attending: Hospice and Palliative Medicine | Admitting: Hospice and Palliative Medicine

## 2021-05-09 ENCOUNTER — Ambulatory Visit: Payer: Medicare Other

## 2021-05-09 ENCOUNTER — Other Ambulatory Visit: Payer: Self-pay

## 2021-05-09 DIAGNOSIS — Z515 Encounter for palliative care: Secondary | ICD-10-CM | POA: Diagnosis not present

## 2021-05-09 DIAGNOSIS — C221 Intrahepatic bile duct carcinoma: Secondary | ICD-10-CM | POA: Diagnosis not present

## 2021-05-09 NOTE — Progress Notes (Signed)
Virtual Visit via Telephone Note ? ?I connected with Sylvia Miller on 05/09/21 at  2:40 PM EST by telephone and verified that I am speaking with the correct person using two identifiers. ? ?Location: ?Patient: Home ?Provider: Clinic ?  ?I discussed the limitations, risks, security and privacy concerns of performing an evaluation and management service by telephone and the availability of in person appointments. I also discussed with the patient that there may be a patient responsible charge related to this service. The patient expressed understanding and agreed to proceed. ? ? 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  Patient is s/p systemic chemotherapy with single agent gemcitabine, given concern for possible intolerance of a more aggressive chemotherapy regimen. ?  ?Observations/Objective: ?I spoke with patient's husband today by phone. ? ?Husband reports the patient is actively declining.  She is weaker and they are having difficulty getting her out of bed.  Oral intake is poor.  Husband confirms still plan is not to pursue further work-up or treatment.  I again recommended hospice involvement and patient's husband says that he would appreciate their assistance.  We will send hospice referral ? ?Assessment and Plan: ?Intrahepatic cholangiocarcinoma -status post chemotherapy with disease progression.  Hospice referral ? ? ?Follow Up Instructions: ?Follow-up as needed ?  ?I discussed the assessment and treatment plan with the patient. The patient was provided an opportunity to ask questions and all were answered. The patient agreed with the plan and demonstrated an understanding of the instructions. ?  ?The patient was advised to call back or seek an in-person evaluation if the symptoms worsen or if the condition  fails to improve as anticipated. ? ?I provided 10 minutes of non-face-to-face time during this encounter. ? ? ?[name], NP ? ? ?

## 2021-05-10 ENCOUNTER — Other Ambulatory Visit: Payer: Medicare Other | Admitting: Nurse Practitioner

## 2021-05-17 ENCOUNTER — Other Ambulatory Visit: Payer: Self-pay | Admitting: *Deleted

## 2021-05-17 DIAGNOSIS — C221 Intrahepatic bile duct carcinoma: Secondary | ICD-10-CM

## 2021-05-17 MED ORDER — PROCHLORPERAZINE MALEATE 10 MG PO TABS: 10.0000 mg | ORAL_TABLET | Freq: Four times a day (QID) | ORAL | 1 refills | Status: DC | PRN

## 2021-05-20 ENCOUNTER — Telehealth: Payer: Self-pay | Admitting: Hospice and Palliative Medicine

## 2021-05-20 NOTE — Telephone Encounter (Signed)
Received a call from hospice nurse.  Patient developed worsening anxiety/delirium on lorazepam.  She was started on Seroquel 12.5 mg 3 times daily has not had significant improvement in symptoms.  Okay to increase dose of Seroquel to 25 to 50 mg 2 times daily as needed. ?

## 2021-05-25 ENCOUNTER — Telehealth: Payer: Self-pay | Admitting: *Deleted

## 2021-05-25 ENCOUNTER — Encounter: Payer: Self-pay | Admitting: Oncology

## 2021-05-25 NOTE — Telephone Encounter (Signed)
Lynn with Hospice called reporting that urine came back positive for UTI and is asking if antibiotics can be ordered for patient who is agitated. Please advise ?

## 2021-05-25 NOTE — Telephone Encounter (Signed)
Call returned to Paige and advised of orders per Amado Nash, NP she said she will call it in because she is unsure which pharmacy patient is currently using and she knows it is not [facility] ?

## 2021-05-27 ENCOUNTER — Telehealth: Payer: Self-pay | Admitting: *Deleted

## 2021-05-27 ENCOUNTER — Other Ambulatory Visit: Payer: Self-pay | Admitting: *Deleted

## 2021-05-27 MED ORDER — SULFAMETHOXAZOLE-TRIMETHOPRIM 800-160 MG PO TABS
1.0000 | ORAL_TABLET | Freq: Two times a day (BID) | ORAL | 0 refills | Status: AC
Start: 2021-05-27 — End: ?

## 2021-05-27 NOTE — Telephone Encounter (Signed)
I got a message about uti for pt and if they wanted to give atv. I asked [name] and she said that pt. Can get bactrim DS bid for 5 days. I spoke to Spray at 2:40 and she told me to send the rx to [facility] on [address]. I did send it. ?

## 2021-05-27 NOTE — Progress Notes (Signed)
t

## 2021-05-30 ENCOUNTER — Other Ambulatory Visit: Payer: Self-pay | Admitting: *Deleted

## 2021-05-30 ENCOUNTER — Other Ambulatory Visit: Payer: Self-pay | Admitting: Hospice and Palliative Medicine

## 2021-05-30 DIAGNOSIS — C221 Intrahepatic bile duct carcinoma: Secondary | ICD-10-CM

## 2021-05-30 MED ORDER — OXYCODONE HCL 5 MG PO TABS: 5.0000 mg | ORAL_TABLET | Freq: Four times a day (QID) | ORAL | 0 refills | Status: AC | PRN

## 2021-05-30 MED ORDER — PROCHLORPERAZINE MALEATE 10 MG PO TABS: 10.0000 mg | ORAL_TABLET | Freq: Four times a day (QID) | ORAL | 1 refills | Status: DC | PRN

## 2021-05-30 NOTE — Progress Notes (Signed)
Hospice nurse requested refill of oxycodone.  

## 2021-06-08 ENCOUNTER — Other Ambulatory Visit: Payer: Self-pay | Admitting: Oncology

## 2021-06-08 DIAGNOSIS — C221 Intrahepatic bile duct carcinoma: Secondary | ICD-10-CM

## 2021-06-10 ENCOUNTER — Other Ambulatory Visit: Payer: Self-pay | Admitting: Hospice and Palliative Medicine

## 2021-06-10 MED ORDER — MORPHINE SULFATE (CONCENTRATE) 10 MG /0.5 ML PO SOLN: 5.0000 mg | ORAL | 0 refills | Status: AC | PRN

## 2021-06-10 NOTE — Progress Notes (Signed)
Hospice nurse reports that patient is actively declining.  She is having difficulty swallowing oxycodone.  We will rotate to morphine RX sent to pharmacy ?

## 2021-07-04 DEATH — deceased
